# Patient Record
Sex: Male | Born: 1950 | Race: Black or African American | Hispanic: No | Marital: Married | Smoking: Former smoker
Health system: Southern US, Community
[De-identification: ages and names within clinical notes are randomized; demographics above are authoritative.]

## PROBLEM LIST (undated history)

## (undated) DIAGNOSIS — J189 Pneumonia, unspecified organism: Secondary | ICD-10-CM

## (undated) DIAGNOSIS — F1110 Opioid abuse, uncomplicated: Secondary | ICD-10-CM

## (undated) DIAGNOSIS — F1010 Alcohol abuse, uncomplicated: Secondary | ICD-10-CM

## (undated) DIAGNOSIS — I469 Cardiac arrest, cause unspecified: Secondary | ICD-10-CM

## (undated) HISTORY — PX: JOINT REPLACEMENT: SHX530

## (undated) HISTORY — PX: TOTAL HIP ARTHROPLASTY: SHX124

---
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
## 2015-10-14 ENCOUNTER — Inpatient Hospital Stay (HOSPITAL_COMMUNITY)
Admission: EM | Admit: 2015-10-14 | Discharge: 2015-10-16 | DRG: 683 | Disposition: A | Discharge status: Home / Self Care | Payer: Medicare Other | Attending: Internal Medicine | Admitting: Internal Medicine

## 2015-10-14 ENCOUNTER — Encounter (HOSPITAL_COMMUNITY): Payer: Self-pay | Admitting: Emergency Medicine

## 2015-10-14 ENCOUNTER — Emergency Department (HOSPITAL_COMMUNITY): Payer: Medicare Other

## 2015-10-14 DIAGNOSIS — F1020 Alcohol dependence, uncomplicated: Secondary | ICD-10-CM | POA: Diagnosis present

## 2015-10-14 DIAGNOSIS — J449 Chronic obstructive pulmonary disease, unspecified: Secondary | ICD-10-CM | POA: Diagnosis present

## 2015-10-14 DIAGNOSIS — R740 Nonspecific elevation of levels of transaminase and lactic acid dehydrogenase [LDH]: Secondary | ICD-10-CM

## 2015-10-14 DIAGNOSIS — J441 Chronic obstructive pulmonary disease with (acute) exacerbation: Secondary | ICD-10-CM

## 2015-10-14 DIAGNOSIS — R509 Fever, unspecified: Secondary | ICD-10-CM

## 2015-10-14 DIAGNOSIS — D7289 Other specified disorders of white blood cells: Secondary | ICD-10-CM

## 2015-10-14 DIAGNOSIS — E860 Dehydration: Secondary | ICD-10-CM | POA: Diagnosis present

## 2015-10-14 DIAGNOSIS — F1010 Alcohol abuse, uncomplicated: Secondary | ICD-10-CM

## 2015-10-14 DIAGNOSIS — E872 Acidosis, unspecified: Secondary | ICD-10-CM | POA: Diagnosis present

## 2015-10-14 DIAGNOSIS — R197 Diarrhea, unspecified: Secondary | ICD-10-CM

## 2015-10-14 DIAGNOSIS — R17 Unspecified jaundice: Secondary | ICD-10-CM

## 2015-10-14 DIAGNOSIS — E876 Hypokalemia: Secondary | ICD-10-CM | POA: Diagnosis present

## 2015-10-14 DIAGNOSIS — N179 Acute kidney failure, unspecified: Secondary | ICD-10-CM | POA: Diagnosis not present

## 2015-10-14 DIAGNOSIS — K529 Noninfective gastroenteritis and colitis, unspecified: Secondary | ICD-10-CM | POA: Diagnosis present

## 2015-10-14 DIAGNOSIS — K760 Fatty (change of) liver, not elsewhere classified: Secondary | ICD-10-CM | POA: Diagnosis present

## 2015-10-14 DIAGNOSIS — R888 Abnormal findings in other body fluids and substances: Secondary | ICD-10-CM

## 2015-10-14 DIAGNOSIS — R739 Hyperglycemia, unspecified: Secondary | ICD-10-CM | POA: Diagnosis present

## 2015-10-14 DIAGNOSIS — R112 Nausea with vomiting, unspecified: Secondary | ICD-10-CM

## 2015-10-14 DIAGNOSIS — J189 Pneumonia, unspecified organism: Secondary | ICD-10-CM

## 2015-10-14 DIAGNOSIS — Z96641 Presence of right artificial hip joint: Secondary | ICD-10-CM | POA: Diagnosis present

## 2015-10-14 DIAGNOSIS — R7401 Elevation of levels of liver transaminase levels: Secondary | ICD-10-CM

## 2015-10-14 HISTORY — DX: Pneumonia, unspecified organism: J18.9

## 2015-10-14 LAB — URINALYSIS, ROUTINE W REFLEX MICROSCOPIC
Glucose, UA: NEGATIVE mg/dL
HGB URINE DIPSTICK: NEGATIVE
Ketones, ur: 15 mg/dL — AB
Leukocytes, UA: NEGATIVE
NITRITE: NEGATIVE
Protein, ur: NEGATIVE mg/dL
SPECIFIC GRAVITY, URINE: 1.02 (ref 1.005–1.030)
pH: 6 (ref 5.0–8.0)

## 2015-10-14 LAB — HEPATIC FUNCTION PANEL
ALT: 21 U/L (ref 17–63)
AST: 64 U/L — AB (ref 15–41)
Albumin: 3.2 g/dL — ABNORMAL LOW (ref 3.5–5.0)
Alkaline Phosphatase: 58 U/L (ref 38–126)
Bilirubin, Direct: 0.4 mg/dL (ref 0.1–0.5)
Indirect Bilirubin: 1.6 mg/dL — ABNORMAL HIGH (ref 0.3–0.9)
TOTAL PROTEIN: 5.7 g/dL — AB (ref 6.5–8.1)
Total Bilirubin: 2 mg/dL — ABNORMAL HIGH (ref 0.3–1.2)

## 2015-10-14 LAB — TROPONIN I

## 2015-10-14 LAB — PHOSPHORUS: Phosphorus: 1.5 mg/dL — ABNORMAL LOW (ref 2.5–4.6)

## 2015-10-14 LAB — CBC WITH DIFFERENTIAL/PLATELET
BASOS ABS: 0 10*3/uL (ref 0.0–0.1)
BASOS PCT: 0 %
EOS ABS: 0 10*3/uL (ref 0.0–0.7)
Eosinophils Relative: 0 %
HCT: 40.9 % (ref 39.0–52.0)
Hemoglobin: 14.4 g/dL (ref 13.0–17.0)
LYMPHS ABS: 1.2 10*3/uL (ref 0.7–4.0)
Lymphocytes Relative: 13 %
MCH: 30.1 pg (ref 26.0–34.0)
MCHC: 35.2 g/dL (ref 30.0–36.0)
MCV: 85.6 fL (ref 78.0–100.0)
MONO ABS: 1 10*3/uL (ref 0.1–1.0)
Monocytes Relative: 11 %
NEUTROS ABS: 6.9 10*3/uL (ref 1.7–7.7)
Neutrophils Relative %: 76 %
Platelets: 172 10*3/uL (ref 150–400)
RBC: 4.78 MIL/uL (ref 4.22–5.81)
RDW: 12.6 % (ref 11.5–15.5)
WBC: 9.1 10*3/uL (ref 4.0–10.5)

## 2015-10-14 LAB — COMPREHENSIVE METABOLIC PANEL
ALBUMIN: 4.2 g/dL (ref 3.5–5.0)
ALK PHOS: 71 U/L (ref 38–126)
ALT: 24 U/L (ref 17–63)
ANION GAP: 16 — AB (ref 5–15)
AST: 72 U/L — AB (ref 15–41)
BILIRUBIN TOTAL: 2.4 mg/dL — AB (ref 0.3–1.2)
BUN: 34 mg/dL — AB (ref 6–20)
CALCIUM: 9 mg/dL (ref 8.9–10.3)
CO2: 21 mmol/L — AB (ref 22–32)
CREATININE: 1.54 mg/dL — AB (ref 0.61–1.24)
Chloride: 105 mmol/L (ref 101–111)
GFR calc Af Amer: 54 mL/min — ABNORMAL LOW (ref >60)
GFR calc non Af Amer: 46 mL/min — ABNORMAL LOW (ref >60)
GLUCOSE: 137 mg/dL — AB (ref 65–99)
Potassium: 3.5 mmol/L (ref 3.5–5.1)
Sodium: 142 mmol/L (ref 135–145)
TOTAL PROTEIN: 7.5 g/dL (ref 6.5–8.1)

## 2015-10-14 LAB — LIPASE, BLOOD: LIPASE: 26 U/L (ref 11–51)

## 2015-10-14 LAB — I-STAT TROPONIN, ED: TROPONIN I, POC: 0 ng/mL (ref 0.00–0.08)

## 2015-10-14 LAB — INFLUENZA PANEL BY PCR (TYPE A & B)
H1N1FLUPCR: NOT DETECTED
INFLBPCR: NEGATIVE
Influenza A By PCR: NEGATIVE

## 2015-10-14 LAB — ETHANOL: Alcohol, Ethyl (B): <5 mg/dL (ref <5)

## 2015-10-14 LAB — I-STAT CG4 LACTIC ACID, ED
Lactic Acid, Venous: 4.61 mmol/L (ref 0.5–1.9)
Lactic Acid, Venous: 1.33 mmol/L (ref 0.5–1.9)

## 2015-10-14 LAB — STREP PNEUMONIAE URINARY ANTIGEN: STREP PNEUMO URINARY ANTIGEN: NEGATIVE

## 2015-10-14 LAB — MAGNESIUM: Magnesium: 1.9 mg/dL (ref 1.7–2.4)

## 2015-10-14 MED ORDER — TRAMADOL HCL 50 MG PO TABS
50.0000 mg | ORAL_TABLET | Freq: Four times a day (QID) | ORAL | Status: DC | PRN
  Administered 2015-10-14: 50 mg via ORAL
  Filled 2015-10-14: qty 1

## 2015-10-14 MED ORDER — LORAZEPAM 2 MG/ML IJ SOLN
1.0000 mg | Freq: Four times a day (QID) | INTRAMUSCULAR | Status: DC | PRN
  Administered 2015-10-14: 1 mg via INTRAVENOUS
  Filled 2015-10-14: qty 1

## 2015-10-14 MED ORDER — DEXTROSE 5 % IV SOLN
1.0000 g | Freq: Once | INTRAVENOUS | Status: AC
  Administered 2015-10-14: 1 g via INTRAVENOUS
  Filled 2015-10-14: qty 10

## 2015-10-14 MED ORDER — ACETAMINOPHEN 325 MG PO TABS: 650.0000 mg | ORAL_TABLET | Freq: Four times a day (QID) | ORAL | Status: DC | PRN

## 2015-10-14 MED ORDER — DEXTROSE 5 % IV SOLN: 500.0000 mg | INTRAVENOUS | Status: DC

## 2015-10-14 MED ORDER — SODIUM CHLORIDE 0.9% FLUSH
3.0000 mL | Freq: Two times a day (BID) | INTRAVENOUS | Status: DC
  Administered 2015-10-14 – 2015-10-15 (×3): 3 mL via INTRAVENOUS

## 2015-10-14 MED ORDER — DEXTROSE 5 % IV SOLN
500.0000 mg | Freq: Once | INTRAVENOUS | Status: AC
  Administered 2015-10-14: 500 mg via INTRAVENOUS
  Filled 2015-10-14: qty 500

## 2015-10-14 MED ORDER — FOLIC ACID 1 MG PO TABS
1.0000 mg | ORAL_TABLET | Freq: Every day | ORAL | Status: DC
  Administered 2015-10-15 – 2015-10-16 (×2): 1 mg via ORAL
  Filled 2015-10-14 (×2): qty 1

## 2015-10-14 MED ORDER — ENOXAPARIN SODIUM 30 MG/0.3ML ~~LOC~~ SOLN: 30.0000 mg | SUBCUTANEOUS | Status: DC

## 2015-10-14 MED ORDER — ONDANSETRON HCL 4 MG/2ML IJ SOLN: 4.0000 mg | Freq: Four times a day (QID) | INTRAMUSCULAR | Status: DC | PRN

## 2015-10-14 MED ORDER — VITAMIN B-1 100 MG PO TABS
100.0000 mg | ORAL_TABLET | Freq: Every day | ORAL | Status: DC
  Administered 2015-10-15 – 2015-10-16 (×2): 100 mg via ORAL
  Filled 2015-10-14 (×2): qty 1

## 2015-10-14 MED ORDER — SODIUM CHLORIDE 0.9 % IV BOLUS (SEPSIS)
500.0000 mL | Freq: Once | INTRAVENOUS | Status: AC
  Administered 2015-10-14: 500 mL via INTRAVENOUS

## 2015-10-14 MED ORDER — SODIUM CHLORIDE 0.9 % IV SOLN
INTRAVENOUS | Status: DC
  Administered 2015-10-15 (×2): via INTRAVENOUS

## 2015-10-14 MED ORDER — SODIUM CHLORIDE 0.9 % IV BOLUS (SEPSIS)
1000.0000 mL | Freq: Once | INTRAVENOUS | Status: AC
  Administered 2015-10-14: 1000 mL via INTRAVENOUS

## 2015-10-14 MED ORDER — DEXTROSE 5 % IV SOLN
1.0000 g | INTRAVENOUS | Status: DC
  Filled 2015-10-14: qty 10

## 2015-10-14 MED ORDER — SODIUM CHLORIDE 0.9 % IV SOLN
1000.0000 mL | INTRAVENOUS | Status: DC
  Administered 2015-10-14: 1000 mL via INTRAVENOUS

## 2015-10-14 MED ORDER — ACETAMINOPHEN 650 MG RE SUPP: 650.0000 mg | Freq: Four times a day (QID) | RECTAL | Status: DC | PRN

## 2015-10-14 MED ORDER — ONDANSETRON HCL 4 MG/2ML IJ SOLN
4.0000 mg | Freq: Once | INTRAMUSCULAR | Status: AC
  Administered 2015-10-14: 4 mg via INTRAVENOUS
  Filled 2015-10-14: qty 2

## 2015-10-14 MED ORDER — LORAZEPAM 1 MG PO TABS
1.0000 mg | ORAL_TABLET | Freq: Four times a day (QID) | ORAL | Status: DC | PRN
  Administered 2015-10-15: 1 mg via ORAL
  Filled 2015-10-14: qty 1

## 2015-10-14 MED ORDER — SODIUM CHLORIDE 0.9 % IV BOLUS (SEPSIS)
250.0000 mL | Freq: Once | INTRAVENOUS | Status: AC
  Administered 2015-10-14: 250 mL via INTRAVENOUS

## 2015-10-14 MED ORDER — THIAMINE HCL 100 MG/ML IJ SOLN: 100.0000 mg | Freq: Every day | INTRAMUSCULAR | Status: DC

## 2015-10-14 MED ORDER — ADULT MULTIVITAMIN W/MINERALS CH
1.0000 | ORAL_TABLET | Freq: Every day | ORAL | Status: DC
  Administered 2015-10-15 – 2015-10-16 (×2): 1 via ORAL
  Filled 2015-10-14 (×2): qty 1

## 2015-10-14 MED ORDER — LORAZEPAM 2 MG/ML IJ SOLN
1.0000 mg | Freq: Once | INTRAMUSCULAR | Status: AC
  Administered 2015-10-14: 1 mg via INTRAVENOUS
  Filled 2015-10-14: qty 1

## 2015-10-14 MED ORDER — ONDANSETRON HCL 4 MG PO TABS: 4.0000 mg | ORAL_TABLET | Freq: Four times a day (QID) | ORAL | Status: DC | PRN

## 2015-10-14 MED ORDER — ENOXAPARIN SODIUM 30 MG/0.3ML ~~LOC~~ SOLN
30.0000 mg | SUBCUTANEOUS | Status: DC
  Administered 2015-10-14 – 2015-10-15 (×2): 30 mg via SUBCUTANEOUS
  Filled 2015-10-14 (×2): qty 0.3

## 2015-10-14 NOTE — ED Provider Notes (Signed)
[facility]-EMERGENCY DEPT Provider Note   CSN: [id number] Arrival date & time: 10/14/15  1215     History   Chief Complaint Chief Complaint  Patient presents with  . Nausea  . Emesis  . Abnormal ECG    HPI Micheal Carey is a 65 y.o. male with a PMHx of COPD, who presents to the ED with complaints of 1-2 days of nausea, vomiting, and diarrhea. Patient states he has had 15 episodes of nonbloody nonbilious emesis in the last 24 hours, and 5 episodes of nonbloody watery diarrhea in the last 24 hours. He has taken Alka-Seltzer with no relief. His wife states that he was so weak yesterday that he could not pick her up from the airport, and today his symptoms were worsening so he called the ambulance. They arrived and found him to be febrile to 101.5, was given 1 g Tylenol prior to arrival, and his fever is 98.4 upon arrival. He admits to "binge drinking" 2 days ago, and the following day the symptoms started, and his wife states that every time he drinks heavily, he has n/v/d in the subsequent days. He states he drinks "occasionally". He denies any other complaints, including chest pain, shortness breath, abdominal pain, hematemesis, melena, hematochezia, constipation, obstipation, cough, rhinorrhea, sore throat, URI symptoms, dysuria, hematuria, numbness, tingling, or focal weakness. He denies any recent traveling, suspicious food intake, sick contacts, recent antibiotics, or chronic NSAID use. Denies any prior abdominal surgeries. His PCP is Dr. Katrinka Blazing at the [facility] in [place]. He has COPD but he denies any other medical problems, including DM2, HTN, or abdominal conditions.    The history is provided by the patient and the spouse. No language interpreter was used.  Emesis   This is a new problem. The current episode started yesterday. The problem occurs more than 10 times per day. The problem has not changed since onset.The emesis has an appearance of stomach contents. The maximum temperature  recorded prior to his arrival was 101 to 101.9 F. The fever has been present for less than 1 day. Associated symptoms include chills, diarrhea and a fever (101.5). Pertinent negatives include no abdominal pain, no arthralgias, no cough, no myalgias and no URI.    History reviewed. No pertinent past medical history.  There are no active problems to display for this patient.   Past Surgical History:  Procedure Laterality Date  . TOTAL HIP ARTHROPLASTY Right        Home Medications    Prior to Admission medications   Not on File    Family History History reviewed. No pertinent family history.  Social History Social History  Substance Use Topics  . Smoking status: Never Smoker  . Smokeless tobacco: Never Used  . Alcohol use Yes     Comment: occasionally - patient reports however he drink 1 bottle of wine on tuesday 10/14/15     Allergies   Review of patient's allergies indicates not on file.   Review of Systems Review of Systems  Constitutional: Positive for chills and fever (101.5).  HENT: Negative for rhinorrhea and sore throat.   Respiratory: Negative for cough and shortness of breath.   Cardiovascular: Negative for chest pain.  Gastrointestinal: Positive for diarrhea, nausea and vomiting. Negative for abdominal pain, blood in stool and constipation.  Genitourinary: Negative for dysuria and hematuria.  Musculoskeletal: Negative for arthralgias and myalgias.  Skin: Negative for color change.  Allergic/Immunologic: Negative for immunocompromised state.  Neurological: Negative for weakness and numbness.  Psychiatric/Behavioral:  Negative for confusion.   10 Systems reviewed and are negative for acute change except as noted in the HPI.   Physical Exam Updated Vital Signs BP 154/80 (BP Location: Right Arm)   Pulse 96   Temp 98.4 F (36.9 C) (Oral)   Resp 18   Ht 5\' 8"  (1.727 m)   Wt 51.3 kg   SpO2 100%   BMI 17.18 kg/m   Physical Exam  Constitutional: He  is oriented to person, place, and time. Vital signs are normal. He appears well-developed and well-nourished.  Non-toxic appearance. No distress.  Afebrile, nontoxic, NAD  HENT:  Head: Normocephalic and atraumatic.  Mouth/Throat: Oropharynx is clear and moist and mucous membranes are normal.  Eyes: Conjunctivae and EOM are normal. Right eye exhibits no discharge. Left eye exhibits no discharge.  Neck: Normal range of motion. Neck supple.  Cardiovascular: Normal rate, regular rhythm, normal heart sounds and intact distal pulses.  Exam reveals no gallop and no friction rub.   No murmur heard. Pulmonary/Chest: Effort normal and breath sounds normal. No respiratory distress. He has no decreased breath sounds. He has no wheezes. He has no rhonchi. He has no rales.  CTAB in all lung fields, no w/r/r, no hypoxia or increased WOB, speaking in full sentences, SpO2 100% on RA   Abdominal: Soft. Normal appearance and bowel sounds are normal. He exhibits no distension. There is no tenderness. There is no rigidity, no rebound, no guarding, no CVA tenderness, no tenderness at McBurney's point and negative Murphy's sign.  Soft, NTND, +BS throughout, no r/g/r, neg murphy's, neg mcburney's, no CVA TTP   Musculoskeletal: Normal range of motion.  Neurological: He is alert and oriented to person, place, and time. He has normal strength. No sensory deficit.  Skin: Skin is warm, dry and intact. No rash noted.  Psychiatric: He has a normal mood and affect.  Nursing note and vitals reviewed.    ED Treatments / Results  Labs (all labs ordered are listed, but only abnormal results are displayed) Labs Reviewed  COMPREHENSIVE METABOLIC PANEL - Abnormal; Notable for the following:       Result Value   CO2 21 (*)    Glucose, Bld 137 (*)    BUN 34 (*)    Creatinine, Ser 1.54 (*)    AST 72 (*)    Total Bilirubin 2.4 (*)    GFR calc non Af Amer 46 (*)    GFR calc Af Amer 54 (*)    Anion gap 16 (*)    All other  components within normal limits  I-STAT CG4 LACTIC ACID, ED - Abnormal; Notable for the following:    Lactic Acid, Venous 4.61 (*)    All other components within normal limits  CULTURE, BLOOD (ROUTINE X 2)  CULTURE, BLOOD (ROUTINE X 2)  URINE CULTURE  CBC WITH DIFFERENTIAL/PLATELET  LIPASE, BLOOD  URINALYSIS, ROUTINE W REFLEX MICROSCOPIC (NOT AT [facility])  ETHANOL  I-STAT TROPOININ, ED  I-STAT CG4 LACTIC ACID, ED    EKG  EKG Interpretation  Date/Time:  Thursday October 14 2015 12:25:13 EDT Ventricular Rate:  94 PR Interval:    QRS Duration: 105 QT Interval:  450 QTC Calculation: 563 R Axis:   95 Text Interpretation:  Sinus rhythm Borderline short PR interval Probable left atrial enlargement Probable right ventricular hypertrophy Abnrm T, consider ischemia, anterolateral lds Prolonged QT interval Baseline wander in lead(s) II III aVR aVL aVF V1 V4 No old tracing to compare Confirmed by FLOYD MD,  DANIEL [phone]([id number]) on 10/14/2015 12:54:58 PM       Radiology No results found.  Procedures Procedures (including critical care time)  Medications Ordered in ED Medications  0.9 %  sodium chloride infusion (1,000 mLs Intravenous New Bag/Given 10/14/15 1309)  cefTRIAXone (ROCEPHIN) 1 g in dextrose 5 % 50 mL IVPB (not administered)  azithromycin (ZITHROMAX) 500 mg in dextrose 5 % 250 mL IVPB (not administered)  sodium chloride 0.9 % bolus 1,000 mL (1,000 mLs Intravenous New Bag/Given 10/14/15 1308)    And  sodium chloride 0.9 % bolus 500 mL (0 mLs Intravenous Stopped 10/14/15 1337)    And  sodium chloride 0.9 % bolus 250 mL (0 mLs Intravenous Stopped 10/14/15 1329)  ondansetron (ZOFRAN) injection 4 mg (4 mg Intravenous Given 10/14/15 1309)  LORazepam (ATIVAN) injection 1 mg (1 mg Intravenous Given 10/14/15 1333)     Initial Impression / Assessment and Plan / ED Course  I have reviewed the triage vital signs and the nursing notes.  Pertinent labs & imaging results that were available  during my care of the patient were reviewed by me and considered in my medical decision making (see chart for details).  Clinical Course    65 y.o. male here with n/v/d x1-2 days, and fever 101.5 PTA, given tylenol PTA and afebrile on arrival. No abdominal pain, no URI symptoms, no cough, clear lung exam, no abdominal tenderness on exam. Admits to drinking alcohol occasionally, last drank 2 days ago, wife states that he gets similar symptoms when he stops drinking after drinking for a few days. ?Withdrawal, although fever is an odd symptom to have with withdrawal. ?Gastroenteritis vs pancreatitis, although no abd pain is also strange for these conditions. Will get labs, U/A, UCx, BCx, lactic, and trop. EKG with RVH, no RBBB seen, no acute ischemic findings on review although baseline wander in multiple leads makes it difficult to assess, and no prior to compare. Doubt need for imaging. Doubt need to call code sepsis or start empiric abx, given that he's afebrile and nontoxic, and without tachycardia or hypotension, but will see what labs show and re-evaluate. Weight based fluids ordered. Will give zofran as well. Will reassess shortly  1:30 PM Nursing stating that he's somewhat tremulous and wonders if he may actually be withdrawing from alcohol, question if he's truthful with how much or how recently he drank, requesting ativan, I feel this is reasonable and may help with his nausea as well. Awaiting work up results, will reassess shortly.   3:11 PM CBC w/diff unremarkable, although noted to have "atypical lymphocytes", wonder if viral infection could be the etiology for this. Lactic acid level 4.61, fluids running. CMP with BUN 34 Cr 1.54 (unknown baseline), AST 72, ALT WNL, Bili 2.4 (?from dehydration vs alcohol use), anion gap 16 likely from lactic acidosis. Lipase WNL. Trop WNL. Added on EtOH level, since pt admits to drinking, want to ensure that he isn't being untruthful about WHEN he last drank,  and that the anion gap isn't from EtOH intoxication. U/A not yet obtained, will ensure this is done soon. Discussed case with my attending Dr. Adela LankFloyd who will see pt, recommended adding on CXR as well to ensure no PNA as source of fever, despite not having any symptoms of such. Very strange picture thus far, with the lactic acidosis but no leukocytosis-- given lactic acid level elevation, will go ahead and start empiric abx. Awaiting U/A, EtOH, and CXR, then will reassess. Pt states his symptoms have improved,  no current questions/complaints at this time. Will likely need admission.   3:51 PM EtOH undetectable. CXR unremarkable aside from ?nipple shadow vs nodule-- no PNA seen. Awaiting U/A but will proceed with admission for ongoing management of his lactic acidosis/atypical lymphocytes/AKI.   4:24 PM  Spoke with Dr. Susie Cassette of [facility] who will admit this pt. Please see their notes for further documentation of care. Pt stable at this time. I appreciate admitting teams' help with this patient's care.    Final Clinical Impressions(s) / ED Diagnoses   Final diagnoses:  Nausea vomiting and diarrhea  Dehydration  Lactic acid acidosis  AKI (acute kidney injury) (HCC)  Elevated bilirubin  Atypical lymphocytes present on peripheral blood smear  Fever, unspecified fever cause    New Prescriptions New Prescriptions   No medications on file     Shaena Parkerson Camprubi-Soms, PA-C 10/14/15 1624    Melene Plan, DO 10/14/15 1721

## 2015-10-14 NOTE — ED Notes (Signed)
Pt unable to void at this time. 

## 2015-10-14 NOTE — H&P (Signed)
[facility] History and Physical  Jago Carton ZOX:[id number] DOB: 1951/01/08 DOA: 10/14/2015  Referring physician:   PCP: No primary care provider on file.   Chief Complaint:   Nausea  . Emesis  . Abnormal ECG      HPI:  65 y.o. male with a PMHx of COPD, who presents to the ED with complaints of 1-2 days of nausea, vomiting, and diarrhea. Patient states he has had 15 episodes of nonbloody nonbilious emesis in the last 24 hours, and 5 episodes of nonbloody watery diarrhea in the last 24 hours. He has taken Alka-Seltzer with no relief. His wife states that he was so weak yesterday that he has not been able to get out of bed. Patient states that he's had diarrhea up until prior to arrival to the ER. Upon arrival patient found to have 101.5 fever. Patient admits to binge drinking every now and then, drank heavily over the weekend. Patient was also in [facility] about 1-1/2 weeks ago. Despite multiple bowel movements and episodes of vomiting, he denies hematemesis, melena, hematochezia. He is a nonsmoker. Denies any recent weight loss. In the ER patient's lactic acid was 4.61 which improved to 1.33 with IV fluids. Initial creatinine was 1.54. UA negative. EtOH level less than 5. Patient initiated on Rocephin and azithromycin for possible pneumonia     Review of Systems: negative for the following  Constitutional: Positive for fever, chills, diaphoresis, denies appetite change and fatigue.  HEENT: Denies photophobia, eye pain, redness, hearing loss, ear pain, congestion, sore throat, rhinorrhea, sneezing, mouth sores, trouble swallowing, neck pain, neck stiffness and tinnitus.  Respiratory: Denies SOB, DOE, cough, chest tightness, and wheezing.  Cardiovascular: Denies chest pain, palpitations and leg swelling.  Gastrointestinal: Positive for nausea, vomiting, abdominal pain, diarrhea, denies constipation, blood in stool and abdominal distention.  Genitourinary: Denies dysuria, urgency,  frequency, hematuria, flank pain and difficulty urinating.  Musculoskeletal: Denies myalgias, back pain, joint swelling, arthralgias and gait problem.  Skin: Denies pallor, rash and wound.  Neurological: Denies dizziness, seizures, syncope, weakness, light-headedness, numbness and headaches.  Hematological: Denies adenopathy. Easy bruising, personal or family bleeding history  Psychiatric/Behavioral: Denies suicidal ideation, mood changes, confusion, nervousness, sleep disturbance and agitation       History reviewed. No pertinent past medical history.   Past Surgical History:  Procedure Laterality Date  . TOTAL HIP ARTHROPLASTY Right       Social History:  reports that he has never smoked. He has never used smokeless tobacco. He reports that he drinks alcohol. His drug history is not on file.    Allergies-none      FAMILY HISTORY  When questioned  Directly-patient reports  No family history of HTN, CVA ,DIABETES, TB, Cancer CAD, Bleeding Disorders, Sickle Cell, diabetes, anemia, asthma,   Prior to Admission medications   Not on File     Physical Exam: Vitals:   10/14/15 1559 10/14/15 1600 10/14/15 1615 10/14/15 1727  BP: 147/91 157/82 154/80 136/77  Pulse: 68 69 77 79  Resp: 12 15 17 19   Temp:      TempSrc:      SpO2: 100% 98% 95% 99%  Weight:      Height:          Constitutional: NAD, calm, comfortable Vitals:   10/14/15 1559 10/14/15 1600 10/14/15 1615 10/14/15 1727  BP: 147/91 157/82 154/80 136/77  Pulse: 68 69 77 79  Resp: 12 15 17 19   Temp:      TempSrc:  SpO2: 100% 98% 95% 99%  Weight:      Height:       Eyes: PERRL, lids and conjunctivae normal, chronically ill-appearing ENMT: Mucous membranes are moist. Posterior pharynx clear of any exudate or lesions.Normal dentition.  Neck: normal, supple, no masses, no thyromegaly Respiratory: clear to auscultation bilaterally, no wheezing, no crackles. Normal respiratory effort. No accessory muscle  use.  Cardiovascular: Regular rate and rhythm, no murmurs / rubs / gallops. No extremity edema. 2+ pedal pulses. No carotid bruits.  Abdomen: no tenderness, no masses palpated. No hepatosplenomegaly. Bowel sounds positive.  Musculoskeletal: no clubbing / cyanosis. No joint deformity upper and lower extremities. Good ROM, no contractures. Normal muscle tone.  Skin: no rashes, lesions, ulcers. No induration Neurologic: CN 2-12 grossly intact. Sensation intact, DTR normal. Strength 5/5 in all 4.  Psychiatric: Normal judgment and insight. Alert and oriented x 3. Normal mood.     Labs on Admission: I have personally reviewed following labs and imaging studies  CBC:  Recent Labs Lab 10/14/15 1315  WBC 9.1  NEUTROABS 6.9  HGB 14.4  HCT 40.9  MCV 85.6  PLT 172    Basic Metabolic Panel:  Recent Labs Lab 10/14/15 1315  NA 142  K 3.5  CL 105  CO2 21*  GLUCOSE 137*  BUN 34*  CREATININE 1.54*  CALCIUM 9.0    GFR: Estimated Creatinine Clearance: 35.6 mL/min (by C-G formula based on SCr of 1.54 mg/dL (H)).  Liver Function Tests:  Recent Labs Lab 10/14/15 1315  AST 72*  ALT 24  ALKPHOS 71  BILITOT 2.4*  PROT 7.5  ALBUMIN 4.2    Recent Labs Lab 10/14/15 1315  LIPASE 26   No results for input(s): AMMONIA in the last 168 hours.  Coagulation Profile: No results for input(s): INR, PROTIME in the last 168 hours. No results for input(s): DDIMER in the last 72 hours.  Cardiac Enzymes: No results for input(s): CKTOTAL, CKMB, CKMBINDEX, TROPONINI in the last 168 hours.  BNP (last 3 results) No results for input(s): PROBNP in the last 8760 hours.  HbA1C: No results for input(s): HGBA1C in the last 72 hours. No results found for: HGBA1C   CBG: No results for input(s): GLUCAP in the last 168 hours.  Lipid Profile: No results for input(s): CHOL, HDL, LDLCALC, TRIG, CHOLHDL, LDLDIRECT in the last 72 hours.  Thyroid Function Tests: No results for input(s): TSH,  T4TOTAL, FREET4, T3FREE, THYROIDAB in the last 72 hours.  Anemia Panel: No results for input(s): VITAMINB12, FOLATE, FERRITIN, TIBC, IRON, RETICCTPCT in the last 72 hours.  Urine analysis:    Component Value Date/Time   COLORURINE YELLOW 10/14/2015 1554   APPEARANCEUR CLEAR 10/14/2015 1554   LABSPEC 1.020 10/14/2015 1554   PHURINE 6.0 10/14/2015 1554   GLUCOSEU NEGATIVE 10/14/2015 1554   HGBUR NEGATIVE 10/14/2015 1554   BILIRUBINUR SMALL (A) 10/14/2015 1554   KETONESUR 15 (A) 10/14/2015 1554   PROTEINUR NEGATIVE 10/14/2015 1554   NITRITE NEGATIVE 10/14/2015 1554   LEUKOCYTESUR NEGATIVE 10/14/2015 1554    Sepsis Labs: @LABRCNTIP (procalcitonin:4,lacticidven:4) )No results found for this or any previous visit (from the past 240 hour(s)).       Radiological Exams on Admission: Dg Chest 2 View  Result Date: 10/14/2015 CLINICAL DATA:  Fever. EXAM: CHEST  2 VIEW COMPARISON:  None. FINDINGS: The heart size and mediastinal contours are within normal limits. No pneumothorax or pleural effusion is noted. Right lung is clear. Rounded nodular density seen projected over left lower lobe  most consistent with overlying nipple shadow. Otherwise left lung is clear. The visualized skeletal structures are unremarkable. IMPRESSION: Rounded nodular density seen projected over left lower lobe most consistent with overlying nipple shadow. Repeat radiograph with nipple markers is recommended to rule out pulmonary nodule. Otherwise no abnormality seen in the chest. Electronically Signed   By: Lupita Raider, M.D.   On: 10/14/2015 15:47   Dg Chest 2 View  Result Date: 10/14/2015 CLINICAL DATA:  Fever. EXAM: CHEST  2 VIEW COMPARISON:  None. FINDINGS: The heart size and mediastinal contours are within normal limits. No pneumothorax or pleural effusion is noted. Right lung is clear. Rounded nodular density seen projected over left lower lobe most consistent with overlying nipple shadow. Otherwise left lung is  clear. The visualized skeletal structures are unremarkable. IMPRESSION: Rounded nodular density seen projected over left lower lobe most consistent with overlying nipple shadow. Repeat radiograph with nipple markers is recommended to rule out pulmonary nodule. Otherwise no abnormality seen in the chest. Electronically Signed   By: Lupita Raider, M.D.   On: 10/14/2015 15:47      EKG: Independently reviewed.  Date/Time:                  Thursday October 14 2015 12:25:13 EDT Ventricular Rate:   94 PR Interval:                        QRS Duration:        105 QT Interval:                      450 QTC Calculation:    563 R Axis:                         95 Text Interpretation:  Sinus rhythm Borderline short PR interval Probable left atrial enlargement Probable right ventricular hypertrophy Abnrm T, consider ischemia, anterolateral lds Prolonged QT interval Baseline wander in lead(s) II III aVR aVL aVF V1 V4 No old tracing to compare   Assessment/Plan Principal Problem:   CAP (community acquired pneumonia)-initial chest x-ray showed a rounded opacity in the left lower lobe, given fever patient was started on empiric antibiotics, received Rocephin/azithromycin which will be continued We'll repeat chest x-ray tomorrow-if no pneumonia seen-consider discontinuing antibiotics Focused pneumonia set orders in place Blood culture 2 obtained in the ER      Lactic acidosis-in the setting of nausea vomiting diarrhea, lactate improved after IV fluids  Intractable nausea vomiting diarrhea-suspect viral gastroenteritis, continue supportive care with antiemetics, fluids     AKI (acute kidney injury) (HCC)-prerenal, initial creatinine 1.54, baseline unknown, continue generous IV hydration    Alcohol abuse-initiated on CIWA protocol, folic acid, thiamine  Abnormal EKG-concerning for left atrial enlargement, initial point of care troponin negative, continue to cycle cardiac enzymes, 2-D echo in the morning  to rule out wall motion abnormalities, especially in the setting of alcohol abuse     DVT prophylaxis: Lovenox     Code Status Orders        Start     Ordered   10/14/15 1639  Full code  Continuous     10/14/15 1642    Code Status History    Date Active Date Inactive Code Status Order ID Comments User Context   This patient has a current code status but no historical code status.      Family Communication: discussed  with patient's Wife By the bedside   Disposition Plan:  Anticipate discharge in 2-3 days pending further workup and clinical progress  Consults called: None  Admission status observation  Total time spent 55 minutes.Greater than 50% of this time was spent in counseling, explanation of diagnosis, planning of further management, and coordination of care  [facility] MD [facility] Pager [phone]  If 7PM-7AM, please contact night-coverage [email or web address] Password [facility]  10/14/2015, 6:06 PM

## 2015-10-14 NOTE — ED Triage Notes (Signed)
Pt to ER BIB [facility] from home with wife, patient in for evaluation of nausea and vomiting onset yesterday with fever, fever measuring 101.5 per EMS, received 1 g tylenol. Pt also reports diarrhea. Denies pain. Per EMS patient EKG showing RBBB, no hx of same. Pt does not see a cardiologist. Not able to tolerate PO fluids since early yesterday morning. Received 300 cc NS in route. HR 110-120, BP 161/92, CBG 141. A/o x4. Speaking in full sentences. Wife at bedside.

## 2015-10-14 NOTE — ED Notes (Signed)
Pt reports "binge drinking" on Tuesday but denies daily ETOH consumption. Tremors noted.

## 2015-10-14 NOTE — ED Notes (Signed)
Attempted report to [facility]W. 

## 2015-10-15 ENCOUNTER — Observation Stay (HOSPITAL_COMMUNITY): Payer: Medicare Other

## 2015-10-15 ENCOUNTER — Observation Stay (HOSPITAL_BASED_OUTPATIENT_CLINIC_OR_DEPARTMENT_OTHER): Payer: Medicare Other

## 2015-10-15 DIAGNOSIS — J449 Chronic obstructive pulmonary disease, unspecified: Secondary | ICD-10-CM | POA: Diagnosis present

## 2015-10-15 DIAGNOSIS — R197 Diarrhea, unspecified: Secondary | ICD-10-CM

## 2015-10-15 DIAGNOSIS — K529 Noninfective gastroenteritis and colitis, unspecified: Secondary | ICD-10-CM | POA: Diagnosis present

## 2015-10-15 DIAGNOSIS — R1110 Vomiting, unspecified: Secondary | ICD-10-CM

## 2015-10-15 DIAGNOSIS — R112 Nausea with vomiting, unspecified: Secondary | ICD-10-CM | POA: Diagnosis present

## 2015-10-15 DIAGNOSIS — F1020 Alcohol dependence, uncomplicated: Secondary | ICD-10-CM | POA: Diagnosis present

## 2015-10-15 DIAGNOSIS — E860 Dehydration: Secondary | ICD-10-CM

## 2015-10-15 DIAGNOSIS — E872 Acidosis: Secondary | ICD-10-CM | POA: Diagnosis present

## 2015-10-15 DIAGNOSIS — R9431 Abnormal electrocardiogram [ECG] [EKG]: Secondary | ICD-10-CM

## 2015-10-15 DIAGNOSIS — Z96641 Presence of right artificial hip joint: Secondary | ICD-10-CM | POA: Diagnosis present

## 2015-10-15 DIAGNOSIS — E876 Hypokalemia: Secondary | ICD-10-CM | POA: Diagnosis present

## 2015-10-15 DIAGNOSIS — N179 Acute kidney failure, unspecified: Secondary | ICD-10-CM | POA: Diagnosis present

## 2015-10-15 DIAGNOSIS — R739 Hyperglycemia, unspecified: Secondary | ICD-10-CM | POA: Diagnosis present

## 2015-10-15 DIAGNOSIS — R888 Abnormal findings in other body fluids and substances: Secondary | ICD-10-CM

## 2015-10-15 DIAGNOSIS — R7401 Elevation of levels of liver transaminase levels: Secondary | ICD-10-CM

## 2015-10-15 DIAGNOSIS — F1010 Alcohol abuse, uncomplicated: Secondary | ICD-10-CM

## 2015-10-15 DIAGNOSIS — R740 Nonspecific elevation of levels of transaminase and lactic acid dehydrogenase [LDH]: Secondary | ICD-10-CM

## 2015-10-15 DIAGNOSIS — K760 Fatty (change of) liver, not elsewhere classified: Secondary | ICD-10-CM | POA: Diagnosis present

## 2015-10-15 LAB — GASTROINTESTINAL PANEL BY PCR, STOOL (REPLACES STOOL CULTURE)

## 2015-10-15 LAB — COMPREHENSIVE METABOLIC PANEL
ALK PHOS: 58 U/L (ref 38–126)
ALT: 21 U/L (ref 17–63)
ANION GAP: 8 (ref 5–15)
AST: 64 U/L — ABNORMAL HIGH (ref 15–41)
Albumin: 3.2 g/dL — ABNORMAL LOW (ref 3.5–5.0)
BUN: 16 mg/dL (ref 6–20)
CALCIUM: 7.6 mg/dL — AB (ref 8.9–10.3)
CO2: 23 mmol/L (ref 22–32)
Chloride: 108 mmol/L (ref 101–111)
Creatinine, Ser: 0.86 mg/dL (ref 0.61–1.24)
GFR calc non Af Amer: >60 mL/min (ref >60)
Glucose, Bld: 88 mg/dL (ref 65–99)
POTASSIUM: 3 mmol/L — AB (ref 3.5–5.1)
SODIUM: 139 mmol/L (ref 135–145)
TOTAL PROTEIN: 5.9 g/dL — AB (ref 6.5–8.1)
Total Bilirubin: 2 mg/dL — ABNORMAL HIGH (ref 0.3–1.2)

## 2015-10-15 LAB — CBC
HEMATOCRIT: 38.2 % — AB (ref 39.0–52.0)
HEMOGLOBIN: 12.5 g/dL — AB (ref 13.0–17.0)
MCH: 28.9 pg (ref 26.0–34.0)
MCHC: 32.7 g/dL (ref 30.0–36.0)
MCV: 88.4 fL (ref 78.0–100.0)
Platelets: 131 10*3/uL — ABNORMAL LOW (ref 150–400)
RBC: 4.32 MIL/uL (ref 4.22–5.81)
RDW: 12.8 % (ref 11.5–15.5)
WBC: 5.7 10*3/uL (ref 4.0–10.5)

## 2015-10-15 LAB — URINE CULTURE: CULTURE: NO GROWTH

## 2015-10-15 LAB — ECHOCARDIOGRAM COMPLETE
HEIGHTINCHES: 68 in
Weight: 1808 oz

## 2015-10-15 LAB — TROPONIN I: Troponin I: <0.03 ng/mL (ref <0.03)

## 2015-10-15 LAB — HEMOGLOBIN A1C
HEMOGLOBIN A1C: 4.9 % (ref 4.8–5.6)
Mean Plasma Glucose: 94 mg/dL

## 2015-10-15 LAB — PROCALCITONIN: Procalcitonin: <0.1 ng/mL

## 2015-10-15 LAB — HIV ANTIBODY (ROUTINE TESTING W REFLEX): HIV Screen 4th Generation wRfx: NONREACTIVE

## 2015-10-15 MED ORDER — SODIUM CHLORIDE 0.9 % IV SOLN
INTRAVENOUS | Status: DC
  Administered 2015-10-15 (×2): via INTRAVENOUS
  Filled 2015-10-15 (×4): qty 1000

## 2015-10-15 NOTE — Progress Notes (Signed)
  Echocardiogram 2D Echocardiogram has been performed.  Nolon RodBrown, Tony 10/15/2015, 2:44 PM

## 2015-10-15 NOTE — Progress Notes (Signed)
PROGRESS NOTE  Micheal Carey ZOX:[id number] DOB: Apr 12, 1950 DOA: 10/14/2015 PCP: No primary care provider on file.  Brief History:  65 year old male with a history of alcohol dependence, COPD presented with 2 day history of nausea, vomiting, and diarrhea. He states that he had approximately 10-15 episodes of emesis and 5 episodes of diarrhea prior to admission without any hematemesis or hematochezia. Apparently, the patient was in his car with his wife driving and was not feeling well. He took his temperature and it was 101.45F. He told his wife to pull over, and called the ambulance to bring him to the emergency department. The patient states that his wife developed also developed diarrhea the day prior to his admission. Interestingly, the patient states that he recently returned home from [facility]. He denies any other travels or unusual foods. He states that he drinks a couple bottles of wine every other week. His last drink was on 10/10/2015. In addition, the patient has been complaining of intermittent chest discomfort for the past 1-2 years which can occur even with rest. He has no prodromal or associated symptoms. Since admission, the patient was found to have serum creatinine 1.54, to be seen about 1, lactic acid 4.61. The patient was admitted for further hydration and workup secondary to his intractable vomiting and diarrhea.  Assessment/Plan: Intractable vomiting and diarrhea -Suspect nonspecific gastroenteritis -GI pathogen panel -Continue intravenous fluids -Lipase 26  Pulmonary opacity -Likely artifact -Discontinue antibiotics -Patient has no leukocytosis or respiratory symptoms   Lactic acidosis -Secondary to volume depletion from the patient's GI losses -Improving with IV hydration  Transaminasemia -hep b surface antigen -hep c antibody -HIV -RUQ [place]  Atypical chest pain -personally reviewed EKG--sinus, TWI V1, V2 -finish cycling troponins  Renal  insufficiency of unclear duration -No baseline renal function to compare -Continue IV hydration and recheck BMP  Alcohol dependence -CIWA -The patient likely drinks more than he endorses  Hyperglycemia -A1C--4.9  COPD  -Stable on room air  -Albuterol when necessary shortness of breath  -The patient's wife continues to smoke exposing the patient to secondhand smoke     Disposition Plan:   Home in 1-2 days  Family Communication:  No Family at bedside--Total time spent 35 minutes.  Greater than 50% spent face to face counseling and coordinating care.   Consultants:  None  Code Status:  FULL  DVT Prophylaxis:[place] Lovenox   Procedures: As Listed in Progress Note Above  Antibiotics: None    Subjective: Patient continues to have loose stools without any blood. His vomiting seems to be better. Denies any fevers, chills, chest pain, coughing, hemoptysis. Denies any abdominal pain, dysuria, hematuria.  Objective: Vitals:   10/14/15 1727 10/14/15 1837 10/14/15 2124 10/15/15 0552  BP: 136/77 (!) 127/108 136/63 130/70  Pulse: 79 80 73 77  Resp: 19 18 16 17   Temp:  98.2 F (36.8 C) 99.3 F (37.4 C) 97.8 F (36.6 C)  TempSrc:  Oral Oral Oral  SpO2: 99% 95% 98% 96%  Weight:      Height:        Intake/Output Summary (Last 24 hours) at 10/15/15 0839 Last data filed at 10/15/15 0650  Gross per 24 hour  Intake             1910 ml  Output              425 ml  Net  1485 ml   Weight change:  Exam:   General:  Pt is alert, follows commands appropriately, not in acute distress  HEENT: No icterus, No thrush, No neck mass, [place]/AT  Cardiovascular: RRR, S1/S2, no rubs, no gallops  Respiratory: CTA bilaterally, no wheezing, no crackles, no rhonchi  Abdomen: Soft/+BS, non tender, non distended, no guarding  Extremities: No edema, No lymphangitis, No petechiae, No rashes, no synovitis   Data Reviewed: I have personally reviewed following labs and imaging  studies Basic Metabolic Panel:  Recent Labs Lab 10/14/15 1315 10/14/15 1901  NA 142  --   K 3.5  --   CL 105  --   CO2 21*  --   GLUCOSE 137*  --   BUN 34*  --   CREATININE 1.54*  --   CALCIUM 9.0  --   MG  --  1.9  PHOS  --  1.5*   Liver Function Tests:  Recent Labs Lab 10/14/15 1315 10/14/15 1901  AST 72* 64*  ALT 24 21  ALKPHOS 71 58  BILITOT 2.4* 2.0*  PROT 7.5 5.7*  ALBUMIN 4.2 3.2*    Recent Labs Lab 10/14/15 1315  LIPASE 26   No results for input(s): AMMONIA in the last 168 hours. Coagulation Profile: No results for input(s): INR, PROTIME in the last 168 hours. CBC:  Recent Labs Lab 10/14/15 1315  WBC 9.1  NEUTROABS 6.9  HGB 14.4  HCT 40.9  MCV 85.6  PLT 172   Cardiac Enzymes:  Recent Labs Lab 10/14/15 1901 10/14/15 2301  TROPONINI <0.03 <0.03   BNP: Invalid input(s): POCBNP CBG: No results for input(s): GLUCAP in the last 168 hours. HbA1C:  Recent Labs  10/14/15 1901  HGBA1C 4.9   Urine analysis:    Component Value Date/Time   COLORURINE YELLOW 10/14/2015 1554   APPEARANCEUR CLEAR 10/14/2015 1554   LABSPEC 1.020 10/14/2015 1554   PHURINE 6.0 10/14/2015 1554   GLUCOSEU NEGATIVE 10/14/2015 1554   HGBUR NEGATIVE 10/14/2015 1554   BILIRUBINUR SMALL (A) 10/14/2015 1554   KETONESUR 15 (A) 10/14/2015 1554   PROTEINUR NEGATIVE 10/14/2015 1554   NITRITE NEGATIVE 10/14/2015 1554   LEUKOCYTESUR NEGATIVE 10/14/2015 1554   Sepsis Labs: @LABRCNTIP (procalcitonin:4,lacticidven:4) )No results found for this or any previous visit (from the past 240 hour(s)).   Scheduled Meds: . azithromycin  500 mg Intravenous Q24H  . cefTRIAXone (ROCEPHIN)  IV  1 g Intravenous Q24H  . enoxaparin (LOVENOX) injection  30 mg Subcutaneous Q24H  . folic acid  1 mg Oral Daily  . multivitamin with minerals  1 tablet Oral Daily  . sodium chloride flush  3 mL Intravenous Q12H  . thiamine  100 mg Oral Daily   Or  . thiamine  100 mg Intravenous Daily    Continuous Infusions: . sodium chloride 125 mL/hr at 10/15/15 0102    Procedures/Studies: Dg Chest 2 View  Result Date: 10/14/2015 CLINICAL DATA:  Fever. EXAM: CHEST  2 VIEW COMPARISON:  None. FINDINGS: The heart size and mediastinal contours are within normal limits. No pneumothorax or pleural effusion is noted. Right lung is clear. Rounded nodular density seen projected over left lower lobe most consistent with overlying nipple shadow. Otherwise left lung is clear. The visualized skeletal structures are unremarkable. IMPRESSION: Rounded nodular density seen projected over left lower lobe most consistent with overlying nipple shadow. Repeat radiograph with nipple markers is recommended to rule out pulmonary nodule. Otherwise no abnormality seen in the chest. Electronically Signed   By: Fayrene FearingJames  Christen Butter, M.D.   On: 10/14/2015 15:47    Dene Nazir, DO  [facility] Pager [phone]  If 7PM-7AM, please contact night-coverage [email or web address] Password [email or web address] 10/15/2015, 8:39 AM   LOS: 0 days

## 2015-10-16 DIAGNOSIS — N179 Acute kidney failure, unspecified: Principal | ICD-10-CM

## 2015-10-16 DIAGNOSIS — E872 Acidosis: Secondary | ICD-10-CM

## 2015-10-16 LAB — LEGIONELLA PNEUMOPHILA SEROGP 1 UR AG: L. PNEUMOPHILA SEROGP 1 UR AG: NEGATIVE

## 2015-10-16 LAB — COMPREHENSIVE METABOLIC PANEL
ALK PHOS: 63 U/L (ref 38–126)
ALT: 22 U/L (ref 17–63)
AST: 58 U/L — AB (ref 15–41)
Albumin: 3.3 g/dL — ABNORMAL LOW (ref 3.5–5.0)
Anion gap: 9 (ref 5–15)
BILIRUBIN TOTAL: 1.7 mg/dL — AB (ref 0.3–1.2)
BUN: 10 mg/dL (ref 6–20)
CO2: 23 mmol/L (ref 22–32)
CREATININE: 0.79 mg/dL (ref 0.61–1.24)
Calcium: 8 mg/dL — ABNORMAL LOW (ref 8.9–10.3)
Chloride: 104 mmol/L (ref 101–111)
GFR calc Af Amer: >60 mL/min (ref >60)
GLUCOSE: 84 mg/dL (ref 65–99)
POTASSIUM: 3 mmol/L — AB (ref 3.5–5.1)
Sodium: 136 mmol/L (ref 135–145)
Total Protein: 6 g/dL — ABNORMAL LOW (ref 6.5–8.1)

## 2015-10-16 LAB — HEMOGLOBIN A1C
HEMOGLOBIN A1C: 4.7 % — AB (ref 4.8–5.6)
Mean Plasma Glucose: 88 mg/dL

## 2015-10-16 LAB — HIV ANTIBODY (ROUTINE TESTING W REFLEX): HIV Screen 4th Generation wRfx: NONREACTIVE

## 2015-10-16 MED ORDER — INFLUENZA VAC SPLIT QUAD 0.5 ML IM SUSY: 0.5000 mL | PREFILLED_SYRINGE | INTRAMUSCULAR | Status: DC

## 2015-10-16 MED ORDER — POTASSIUM CHLORIDE IN NACL 20-0.9 MEQ/L-% IV SOLN
INTRAVENOUS | Status: DC
  Administered 2015-10-16: 07:00:00 via INTRAVENOUS
  Filled 2015-10-16: qty 1000

## 2015-10-16 MED ORDER — POTASSIUM CHLORIDE CRYS ER 20 MEQ PO TBCR
40.0000 meq | EXTENDED_RELEASE_TABLET | Freq: Once | ORAL | Status: AC
  Administered 2015-10-16: 40 meq via ORAL
  Filled 2015-10-16: qty 2

## 2015-10-16 NOTE — Discharge Summary (Signed)
Physician Discharge Summary  Micheal Carey ZOX:[id number] DOB: Jun 17, 1950 DOA: 10/14/2015  PCP: No primary care provider on file.  Admit date: 10/14/2015 Discharge date: 10/16/2015  Admitted From:  Home Disposition:  Home  Recommendations for Outpatient Follow-up:  1. Follow up with PCP in 1-2 weeks 2. Please obtain BMP/CBC in one week  Home Health:No Equipment/Devices:None  Discharge Condition:stable CODE STATUS:FULL Diet recommendation: Heart Healthy   Brief/Interim Summary: 65 year old male with a history of alcohol dependence, COPD presented with 2 day history of nausea, vomiting, and diarrhea. He states that he had approximately 10-15 episodes of emesis and 5 episodes of diarrhea prior to admission without any hematemesis or hematochezia. Apparently, the patient was in his car with his wife driving and was not feeling well. He took his temperature and it was 101.45F. He told his wife to pull over, and called the ambulance to bring him to the emergency department. The patient states that his wife developed also developed diarrhea the day prior to his admission. Interestingly, the patient states that he recently returned home from [facility]. He denies any other travels or unusual foods. He states that he drinks a couple bottles of wine every other week. His last drink was on 10/10/2015. In addition, the patient has been complaining of intermittent chest discomfort for the past 1-2 years which can occur even with rest. He has no prodromal or associated symptoms. Since admission, the patient was found to have serum creatinine 1.54, to be seen about 1, lactic acid 4.61. The patient was admitted for further hydration and workup secondary to his intractable vomiting and diarrhea.  The patient improved significantly faster than anticipated with routine medical treatment  Discharge Diagnoses:  Intractable vomiting and diarrhea -Suspect nonspecific gastroenteritis -GI pathogen  panel--Negative -Continue intravenous fluids -Lipase 26 -Within 24-36 hours, the patient's clinical symptoms improved significantly. His diet was advanced which she tolerated. He did not have any further vomiting or diarrhea.  Pulmonary opacity -Likely artifact -Discontinue antibiotics--> patient remained clinically stable -Patient has no leukocytosis or respiratory symptoms   AKI -Secondary to volume depletion -Improved with intravenous fluids -Serum creatinine 0.79 on the day of discharge  Lactic acidosis -Secondary to volume depletion from the patient's GI losses -Improving with IV hydration  Transaminasemia -hep b surface antigen--negative -hep c antibody--negative -HIV--negative -RUQ US--hepatic steatosis -likely due to hepatic steatosis superimposed upon Etoh  Atypical chest pain -personally reviewed EKG--sinus, TWI V1, V2 -finish cycling troponins--neg x 3  Alcohol dependence -CIWA -The patient likely drinks more than he endorses -No symptoms of withdrawal  Hyperglycemia -A1C--4.9  COPD  -Stable on room air  -Albuterol when necessary shortness of breath  -The patient's wife continues to smoke exposing the patient to secondhand smoke  Hypokalemia -Repleted   Discharge Instructions      Consultations:  none   Procedures/Studies: Dg Chest 2 View  Result Date: 10/15/2015 CLINICAL DATA:  Fever, shortness of breath and pneumonia. EXAM: CHEST  2 VIEW COMPARISON:  PA and lateral chest 10/14/2015. FINDINGS: Lungs are clear. Likely nipple shadow on the left is again seen. Heart size is normal. No pneumothorax or pleural effusion. No bony abnormality. IMPRESSION: No acute disease. Likely nipple shadow on the left is again seen. As noted on the prior exam, repeat PA film with nipple marker in place could be used for confirmation. Electronically Signed   By: Drusilla Kanner M.D.   On: 10/15/2015 09:10   Dg Chest 2 View  Result Date:  10/14/2015 CLINICAL DATA:  Fever.  EXAM: CHEST  2 VIEW COMPARISON:  None. FINDINGS: The heart size and mediastinal contours are within normal limits. No pneumothorax or pleural effusion is noted. Right lung is clear. Rounded nodular density seen projected over left lower lobe most consistent with overlying nipple shadow. Otherwise left lung is clear. The visualized skeletal structures are unremarkable. IMPRESSION: Rounded nodular density seen projected over left lower lobe most consistent with overlying nipple shadow. Repeat radiograph with nipple markers is recommended to rule out pulmonary nodule. Otherwise no abnormality seen in the chest. Electronically Signed   By: Lupita RaiderJames  Green Jr, M.D.   On: 10/14/2015 15:47   [place] Abdomen Limited Ruq  Result Date: 10/15/2015 CLINICAL DATA:  Transaminasemia. EXAM: US ABDOMEN LIMITED - RIGHT UPPER QUADRANT COMPARISON:  None. FINDINGS: Gallbladder: No gallstones or wall thickening visualized. No sonographic Murphy sign noted by sonographer. Common bile duct: Diameter: 4 mm which is within normal limits. Liver: No focal lesion identified. Increased echogenicity of hepatic parenchyma is noted consistent with fatty infiltration. IMPRESSION: Increased echogenicity of hepatic parenchyma suggesting fatty infiltration. No other abnormality seen in the right upper quadrant of the abdomen. Electronically Signed   By: Lupita RaiderJames  Green Jr, M.D.   On: 10/15/2015 15:14        Discharge Exam: Vitals:   10/16/15 0226 10/16/15 0512  BP: (!) 141/81 135/80  Pulse: 86 82  Resp: 18 18  Temp:  98.2 F (36.8 C)   Vitals:   10/15/15 1829 10/16/15 0004 10/16/15 0226 10/16/15 0512  BP: (!) 154/68 (!) 123/56 (!) 141/81 135/80  Pulse: 65 65 86 82  Resp: 15 18 18 18   Temp: 98.1 F (36.7 C) 98.4 F (36.9 C)  98.2 F (36.8 C)  TempSrc: Oral Oral  Oral  SpO2: 100% 98% 95% 99%  Weight:      Height:        General: Pt is alert, awake, not in acute distress Cardiovascular: RRR, S1/S2  +, no rubs, no gallops Respiratory: CTA bilaterally, no wheezing, no rhonchi Abdominal: Soft, NT, ND, bowel sounds + Extremities: no edema, no cyanosis   The results of significant diagnostics from this hospitalization (including imaging, microbiology, ancillary and laboratory) are listed below for reference.    Significant Diagnostic Studies: Dg Chest 2 View  Result Date: 10/15/2015 CLINICAL DATA:  Fever, shortness of breath and pneumonia. EXAM: CHEST  2 VIEW COMPARISON:  PA and lateral chest 10/14/2015. FINDINGS: Lungs are clear. Likely nipple shadow on the left is again seen. Heart size is normal. No pneumothorax or pleural effusion. No bony abnormality. IMPRESSION: No acute disease. Likely nipple shadow on the left is again seen. As noted on the prior exam, repeat PA film with nipple marker in place could be used for confirmation. Electronically Signed   By: Drusilla Kannerhomas  Dalessio M.D.   On: 10/15/2015 09:10   Dg Chest 2 View  Result Date: 10/14/2015 CLINICAL DATA:  Fever. EXAM: CHEST  2 VIEW COMPARISON:  None. FINDINGS: The heart size and mediastinal contours are within normal limits. No pneumothorax or pleural effusion is noted. Right lung is clear. Rounded nodular density seen projected over left lower lobe most consistent with overlying nipple shadow. Otherwise left lung is clear. The visualized skeletal structures are unremarkable. IMPRESSION: Rounded nodular density seen projected over left lower lobe most consistent with overlying nipple shadow. Repeat radiograph with nipple markers is recommended to rule out pulmonary nodule. Otherwise no abnormality seen in the chest. Electronically Signed   By: Lupita RaiderJames  Green Jr, M.D.  On: 10/14/2015 15:47   US Abdomen Limited Ruq  Result Date: 10/15/2015 CLINICAL DATA:  Transaminasemia. EXAM: US ABDOMEN LIMITED - RIGHT UPPER QUADRANT COMPARISON:  None. FINDINGS: Gallbladder: No gallstones or wall thickening visualized. No sonographic Murphy sign noted by  sonographer. Common bile duct: Diameter: 4 mm which is within normal limits. Liver: No focal lesion identified. Increased echogenicity of hepatic parenchyma is noted consistent with fatty infiltration. IMPRESSION: Increased echogenicity of hepatic parenchyma suggesting fatty infiltration. No other abnormality seen in the right upper quadrant of the abdomen. Electronically Signed   By: Lupita Raider, M.D.   On: 10/15/2015 15:14     Microbiology: Recent Results (from the past 240 hour(s))  Blood Culture (routine x 2)     Status: None (Preliminary result)   Collection Time: 10/14/15  1:10 PM  Result Value Ref Range Status   Specimen Description BLOOD RIGHT FOREARM  Final   Special Requests BOTTLES DRAWN AEROBIC AND ANAEROBIC 5CC  Final   Culture NO GROWTH 2 DAYS  Final   Report Status PENDING  Incomplete  Blood Culture (routine x 2)     Status: None (Preliminary result)   Collection Time: 10/14/15  2:07 PM  Result Value Ref Range Status   Specimen Description BLOOD RIGHT HAND  Final   Special Requests BOTTLES DRAWN AEROBIC AND ANAEROBIC 5CC  Final   Culture NO GROWTH 2 DAYS  Final   Report Status PENDING  Incomplete  Urine culture     Status: None   Collection Time: 10/14/15  3:54 PM  Result Value Ref Range Status   Specimen Description URINE, CLEAN CATCH  Final   Special Requests NONE  Final   Culture NO GROWTH  Final   Report Status 10/15/2015 FINAL  Final  Gastrointestinal Panel by PCR , Stool     Status: None   Collection Time: 10/15/15  6:16 AM  Result Value Ref Range Status   Campylobacter species NOT DETECTED NOT DETECTED Final   Plesimonas shigelloides NOT DETECTED NOT DETECTED Final   Salmonella species NOT DETECTED NOT DETECTED Final   Yersinia enterocolitica NOT DETECTED NOT DETECTED Final   Vibrio species NOT DETECTED NOT DETECTED Final   Vibrio cholerae NOT DETECTED NOT DETECTED Final   Enteroaggregative E coli (EAEC) NOT DETECTED NOT DETECTED Final   Enteropathogenic  E coli (EPEC) NOT DETECTED NOT DETECTED Final   Enterotoxigenic E coli (ETEC) NOT DETECTED NOT DETECTED Final   Shiga like toxin producing E coli (STEC) NOT DETECTED NOT DETECTED Final   Shigella/Enteroinvasive E coli (EIEC) NOT DETECTED NOT DETECTED Final   Cryptosporidium NOT DETECTED NOT DETECTED Final   Cyclospora cayetanensis NOT DETECTED NOT DETECTED Final   Entamoeba histolytica NOT DETECTED NOT DETECTED Final   Giardia lamblia NOT DETECTED NOT DETECTED Final   Adenovirus F40/41 NOT DETECTED NOT DETECTED Final   Astrovirus NOT DETECTED NOT DETECTED Final   Norovirus GI/GII NOT DETECTED NOT DETECTED Final   Rotavirus A NOT DETECTED NOT DETECTED Final   Sapovirus (I, II, IV, and V) NOT DETECTED NOT DETECTED Final     Labs: Basic Metabolic Panel:  Recent Labs Lab 10/14/15 1315 10/14/15 1901 10/15/15 0906 10/16/15 0459  NA 142  --  139 136  K 3.5  --  3.0* 3.0*  CL 105  --  108 104  CO2 21*  --  23 23  GLUCOSE 137*  --  88 84  BUN 34*  --  16 10  CREATININE 1.54*  --  0.86 0.79  CALCIUM 9.0  --  7.6* 8.0*  MG  --  1.9  --   --   PHOS  --  1.5*  --   --    Liver Function Tests:  Recent Labs Lab 10/14/15 1315 10/14/15 1901 10/15/15 0906 10/16/15 0459  AST 72* 64* 64* 58*  ALT 24 21 21 22   ALKPHOS 71 58 58 63  BILITOT 2.4* 2.0* 2.0* 1.7*  PROT 7.5 5.7* 5.9* 6.0*  ALBUMIN 4.2 3.2* 3.2* 3.3*    Recent Labs Lab 10/14/15 1315  LIPASE 26   No results for input(s): AMMONIA in the last 168 hours. CBC:  Recent Labs Lab 10/14/15 1315 10/15/15 0743  WBC 9.1 5.7  NEUTROABS 6.9  --   HGB 14.4 12.5*  HCT 40.9 38.2*  MCV 85.6 88.4  PLT 172 131*   Cardiac Enzymes:  Recent Labs Lab 10/14/15 1901 10/14/15 2301 10/15/15 0906  TROPONINI <0.03 <0.03 <0.03   BNP: Invalid input(s): POCBNP CBG: No results for input(s): GLUCAP in the last 168 hours.  Time coordinating discharge:  Greater than 30 minutes  Signed:  Ajahnae Rathgeber, DO [facility] Pager: [phone] 10/16/2015, 1:25 PM

## 2015-10-19 LAB — CULTURE, BLOOD (ROUTINE X 2)
CULTURE: NO GROWTH
Culture: NO GROWTH

## 2015-12-03 ENCOUNTER — Inpatient Hospital Stay (HOSPITAL_COMMUNITY)
Admission: EM | Admit: 2015-12-03 | Discharge: 2015-12-05 | DRG: 894 | Discharge status: Against Medical Advice | Payer: Medicare Other | Attending: Family Medicine | Admitting: Family Medicine

## 2015-12-03 ENCOUNTER — Emergency Department (HOSPITAL_COMMUNITY): Payer: Medicare Other

## 2015-12-03 ENCOUNTER — Encounter (HOSPITAL_COMMUNITY): Payer: Self-pay | Admitting: Emergency Medicine

## 2015-12-03 DIAGNOSIS — R740 Nonspecific elevation of levels of transaminase and lactic acid dehydrogenase [LDH]: Secondary | ICD-10-CM | POA: Diagnosis not present

## 2015-12-03 DIAGNOSIS — E860 Dehydration: Secondary | ICD-10-CM | POA: Diagnosis present

## 2015-12-03 DIAGNOSIS — A419 Sepsis, unspecified organism: Secondary | ICD-10-CM | POA: Diagnosis not present

## 2015-12-03 DIAGNOSIS — E162 Hypoglycemia, unspecified: Secondary | ICD-10-CM | POA: Diagnosis present

## 2015-12-03 DIAGNOSIS — E872 Acidosis, unspecified: Secondary | ICD-10-CM | POA: Diagnosis present

## 2015-12-03 DIAGNOSIS — G43A1 Cyclical vomiting, intractable: Secondary | ICD-10-CM

## 2015-12-03 DIAGNOSIS — R7401 Elevation of levels of liver transaminase levels: Secondary | ICD-10-CM | POA: Diagnosis present

## 2015-12-03 DIAGNOSIS — E43 Unspecified severe protein-calorie malnutrition: Secondary | ICD-10-CM | POA: Diagnosis present

## 2015-12-03 DIAGNOSIS — R112 Nausea with vomiting, unspecified: Secondary | ICD-10-CM | POA: Diagnosis not present

## 2015-12-03 DIAGNOSIS — K529 Noninfective gastroenteritis and colitis, unspecified: Secondary | ICD-10-CM | POA: Diagnosis present

## 2015-12-03 DIAGNOSIS — Z681 Body mass index (BMI) 19 or less, adult: Secondary | ICD-10-CM

## 2015-12-03 DIAGNOSIS — R197 Diarrhea, unspecified: Secondary | ICD-10-CM

## 2015-12-03 DIAGNOSIS — E876 Hypokalemia: Secondary | ICD-10-CM | POA: Diagnosis not present

## 2015-12-03 DIAGNOSIS — E875 Hyperkalemia: Secondary | ICD-10-CM | POA: Diagnosis present

## 2015-12-03 DIAGNOSIS — J410 Simple chronic bronchitis: Secondary | ICD-10-CM

## 2015-12-03 DIAGNOSIS — F1010 Alcohol abuse, uncomplicated: Secondary | ICD-10-CM | POA: Diagnosis not present

## 2015-12-03 DIAGNOSIS — J449 Chronic obstructive pulmonary disease, unspecified: Secondary | ICD-10-CM | POA: Diagnosis present

## 2015-12-03 LAB — COMPREHENSIVE METABOLIC PANEL
ALBUMIN: 4.6 g/dL (ref 3.5–5.0)
ALK PHOS: 90 U/L (ref 38–126)
ALT: 63 U/L (ref 17–63)
ANION GAP: 24 — AB (ref 5–15)
AST: 280 U/L — ABNORMAL HIGH (ref 15–41)
BILIRUBIN TOTAL: 1.3 mg/dL — AB (ref 0.3–1.2)
BUN: 21 mg/dL — AB (ref 6–20)
CALCIUM: 9.4 mg/dL (ref 8.9–10.3)
CO2: 13 mmol/L — ABNORMAL LOW (ref 22–32)
CREATININE: 1.19 mg/dL (ref 0.61–1.24)
Chloride: 103 mmol/L (ref 101–111)
GFR calc Af Amer: >60 mL/min (ref >60)
GFR calc non Af Amer: >60 mL/min (ref >60)
GLUCOSE: 254 mg/dL — AB (ref 65–99)
Potassium: 5.2 mmol/L — ABNORMAL HIGH (ref 3.5–5.1)
Sodium: 140 mmol/L (ref 135–145)
TOTAL PROTEIN: 7.9 g/dL (ref 6.5–8.1)

## 2015-12-03 LAB — URINALYSIS, ROUTINE W REFLEX MICROSCOPIC
BILIRUBIN URINE: NEGATIVE
Glucose, UA: 500 mg/dL — AB
HGB URINE DIPSTICK: NEGATIVE
Leukocytes, UA: NEGATIVE
NITRITE: NEGATIVE
PROTEIN: NEGATIVE mg/dL
Specific Gravity, Urine: 1.02 (ref 1.005–1.030)
pH: 6 (ref 5.0–8.0)

## 2015-12-03 LAB — I-STAT CHEM 8, ED
BUN: 28 mg/dL — ABNORMAL HIGH (ref 6–20)
CALCIUM ION: 1.06 mmol/L — AB (ref 1.15–1.40)
CREATININE: 1 mg/dL (ref 0.61–1.24)
Chloride: 108 mmol/L (ref 101–111)
GLUCOSE: 188 mg/dL — AB (ref 65–99)
HCT: 44 % (ref 39.0–52.0)
HEMOGLOBIN: 15 g/dL (ref 13.0–17.0)
Potassium: 5 mmol/L (ref 3.5–5.1)
Sodium: 138 mmol/L (ref 135–145)
TCO2: 15 mmol/L (ref 0–100)

## 2015-12-03 LAB — RAPID URINE DRUG SCREEN, HOSP PERFORMED
AMPHETAMINES: NOT DETECTED
BENZODIAZEPINES: NOT DETECTED
Barbiturates: NOT DETECTED
COCAINE: NOT DETECTED
Opiates: NOT DETECTED
Tetrahydrocannabinol: POSITIVE — AB

## 2015-12-03 LAB — CBC
HCT: 41.9 % (ref 39.0–52.0)
HEMOGLOBIN: 14.1 g/dL (ref 13.0–17.0)
MCH: 29.9 pg (ref 26.0–34.0)
MCHC: 33.7 g/dL (ref 30.0–36.0)
MCV: 88.8 fL (ref 78.0–100.0)
PLATELETS: 233 10*3/uL (ref 150–400)
RBC: 4.72 MIL/uL (ref 4.22–5.81)
RDW: 13.9 % (ref 11.5–15.5)
WBC: 13.4 10*3/uL — ABNORMAL HIGH (ref 4.0–10.5)

## 2015-12-03 LAB — PROCALCITONIN: PROCALCITONIN: 0.28 ng/mL

## 2015-12-03 LAB — I-STAT CG4 LACTIC ACID, ED
LACTIC ACID, VENOUS: 7.49 mmol/L — AB (ref 0.5–1.9)
LACTIC ACID, VENOUS: 2.1 mmol/L — AB (ref 0.5–1.9)

## 2015-12-03 LAB — TROPONIN I: Troponin I: <0.03 ng/mL (ref <0.03)

## 2015-12-03 LAB — LIPASE, BLOOD: Lipase: 22 U/L (ref 11–51)

## 2015-12-03 MED ORDER — LORAZEPAM 2 MG/ML IJ SOLN: 1.0000 mg | Freq: Four times a day (QID) | INTRAMUSCULAR | Status: DC | PRN

## 2015-12-03 MED ORDER — VANCOMYCIN HCL IN DEXTROSE 1-5 GM/200ML-% IV SOLN
1000.0000 mg | Freq: Once | INTRAVENOUS | Status: AC
  Administered 2015-12-03: 1000 mg via INTRAVENOUS
  Filled 2015-12-03: qty 200

## 2015-12-03 MED ORDER — ADULT MULTIVITAMIN W/MINERALS CH
1.0000 | ORAL_TABLET | Freq: Every day | ORAL | Status: DC
  Administered 2015-12-04 – 2015-12-05 (×2): 1 via ORAL
  Filled 2015-12-03 (×2): qty 1

## 2015-12-03 MED ORDER — MORPHINE SULFATE (PF) 2 MG/ML IV SOLN
4.0000 mg | Freq: Once | INTRAVENOUS | Status: AC
  Administered 2015-12-03: 4 mg via INTRAVENOUS
  Filled 2015-12-03: qty 2

## 2015-12-03 MED ORDER — THIAMINE HCL 100 MG/ML IJ SOLN: 100.0000 mg | Freq: Every day | INTRAMUSCULAR | Status: DC

## 2015-12-03 MED ORDER — ONDANSETRON HCL 4 MG/2ML IJ SOLN
4.0000 mg | Freq: Once | INTRAMUSCULAR | Status: AC | PRN
  Administered 2015-12-03: 4 mg via INTRAVENOUS
  Filled 2015-12-03: qty 2

## 2015-12-03 MED ORDER — FOLIC ACID 1 MG PO TABS
1.0000 mg | ORAL_TABLET | Freq: Every day | ORAL | Status: DC
  Administered 2015-12-04 – 2015-12-05 (×2): 1 mg via ORAL
  Filled 2015-12-03 (×2): qty 1

## 2015-12-03 MED ORDER — SODIUM CHLORIDE 0.9 % IV BOLUS (SEPSIS)
1000.0000 mL | Freq: Once | INTRAVENOUS | Status: AC
  Administered 2015-12-03: 1000 mL via INTRAVENOUS

## 2015-12-03 MED ORDER — VANCOMYCIN HCL IN DEXTROSE 750-5 MG/150ML-% IV SOLN
750.0000 mg | Freq: Two times a day (BID) | INTRAVENOUS | Status: DC
  Administered 2015-12-04 (×2): 750 mg via INTRAVENOUS
  Filled 2015-12-03 (×3): qty 150

## 2015-12-03 MED ORDER — PIPERACILLIN-TAZOBACTAM 3.375 G IVPB
3.3750 g | Freq: Three times a day (TID) | INTRAVENOUS | Status: DC
  Administered 2015-12-04 – 2015-12-05 (×4): 3.375 g via INTRAVENOUS
  Filled 2015-12-03 (×5): qty 50

## 2015-12-03 MED ORDER — ONDANSETRON HCL 4 MG/2ML IJ SOLN
4.0000 mg | Freq: Once | INTRAMUSCULAR | Status: AC
  Administered 2015-12-03: 4 mg via INTRAVENOUS
  Filled 2015-12-03: qty 2

## 2015-12-03 MED ORDER — VITAMIN B-1 100 MG PO TABS
100.0000 mg | ORAL_TABLET | Freq: Every day | ORAL | Status: DC
  Administered 2015-12-04 – 2015-12-05 (×2): 100 mg via ORAL
  Filled 2015-12-03 (×2): qty 1

## 2015-12-03 MED ORDER — ACETAMINOPHEN 325 MG PO TABS
650.0000 mg | ORAL_TABLET | Freq: Once | ORAL | Status: AC
Start: 2015-12-03 — End: 2015-12-03
  Administered 2015-12-03: 650 mg via ORAL
  Filled 2015-12-03: qty 2

## 2015-12-03 MED ORDER — SODIUM CHLORIDE 0.9 % IV BOLUS (SEPSIS)
250.0000 mL | Freq: Once | INTRAVENOUS | Status: AC
  Administered 2015-12-03: 250 mL via INTRAVENOUS

## 2015-12-03 MED ORDER — LORAZEPAM 1 MG PO TABS: 1.0000 mg | ORAL_TABLET | Freq: Four times a day (QID) | ORAL | Status: DC | PRN

## 2015-12-03 MED ORDER — SODIUM CHLORIDE 0.9 % IV BOLUS (SEPSIS)
1000.0000 mL | Freq: Once | INTRAVENOUS | Status: AC
  Administered 2015-12-03 (×2): 1000 mL via INTRAVENOUS

## 2015-12-03 MED ORDER — PIPERACILLIN-TAZOBACTAM 3.375 G IVPB 30 MIN
3.3750 g | Freq: Once | INTRAVENOUS | Status: AC
  Administered 2015-12-03: 3.375 g via INTRAVENOUS
  Filled 2015-12-03: qty 50

## 2015-12-03 MED ORDER — SODIUM CHLORIDE 0.9 % IV BOLUS (SEPSIS)
500.0000 mL | Freq: Once | INTRAVENOUS | Status: AC
  Administered 2015-12-03: 500 mL via INTRAVENOUS

## 2015-12-03 NOTE — ED Notes (Signed)
ED Provider at bedside. 

## 2015-12-03 NOTE — ED Notes (Signed)
Bed: WA25 Expected date:  Expected time:  Means of arrival:  Comments: EMS  

## 2015-12-03 NOTE — ED Notes (Signed)
BOTH cultures drawn at present time: R hand and R fa.

## 2015-12-03 NOTE — ED Notes (Signed)
Patient transported to X-ray 

## 2015-12-03 NOTE — H&P (Signed)
Micheal LampHarvey Molchan ZOX:[id number] DOB: 09/17/1950 DOA: 12/03/2015     PCP: No primary care provider on file.   Outpatient Specialists: none  Patient coming from:  home Lives   With family    Chief Complaint: Nausea vomiting diarrhea  HPI: Micheal Carey is a 65 y.o. male with medical history significant of alcohol dependence without history of withdrawals, COPD      Presented with a few days of nausea vomiting and diarrhea his been feeling lightheaded  Patient 3 weeks couple bottles of wine every other week he have had some alcohol tonight. Patient endorses chills although no fever. EMS was called to his house was noted that his CBG was down to 60 to a given D10 and CBG improved up to 290. Patient describes flulike symptoms diarrhea has been nonbloody and watery no blood present in emesis he has not attempted to use any medications for his symptoms this was very sudden in onset he never measured his temperature but he feels that maybe he had subjective fever and denies any recent travel besides in [place] [facility] [place]. denies any chest pain, no shortness of breath.   Regarding pertinent Chronic problems: In September patient had similar admission for up to 10-15 episodes of nausea and vomiting and lactic acidosis at that time he was admitted and lactic acidosis has resolved with fluid rehydration. She was febrile he had recent trip to last for this otherwise has had no recent travel. He was diagnosed with nonspecific gastroenteritis GI pathogen panel is negative he was rehydrated lipase was unremarkable  IN ER:  Temp (24hrs), Avg:99.5 F (37.5 C), Min:98.2 F (36.8 C), Max:100.3 F (37.9 C)      RR 17 HR 97 BP 146/77 Initial lactic acid 7.4 9 repeat 2.1 troponin 0.03 CR 1.0 potassium 5.2 WBC 13.4 Chest x-ray unremarkable Following Medications were ordered in ER: Medications  piperacillin-tazobactam (ZOSYN) IVPB 3.375 g (not administered)  vancomycin (VANCOCIN) IVPB 750 mg/150  ml premix (not administered)  ondansetron (ZOFRAN) injection 4 mg (4 mg Intravenous Given 12/03/15 1735)  ondansetron (ZOFRAN) injection 4 mg (4 mg Intravenous Given 12/03/15 1843)  sodium chloride 0.9 % bolus 1,000 mL (0 mLs Intravenous Stopped 12/03/15 1955)    And  sodium chloride 0.9 % bolus 500 mL (500 mLs Intravenous New Bag/Given 12/03/15 2007)    And  sodium chloride 0.9 % bolus 250 mL (250 mLs Intravenous New Bag/Given 12/03/15 2008)  piperacillin-tazobactam (ZOSYN) IVPB 3.375 g (0 g Intravenous Stopped 12/03/15 1955)  vancomycin (VANCOCIN) IVPB 1000 mg/200 mL premix (1,000 mg Intravenous New Bag/Given 12/03/15 2007)  acetaminophen (TYLENOL) tablet 650 mg (650 mg Oral Given 12/03/15 1910)  morphine 2 MG/ML injection 4 mg (4 mg Intravenous Given 12/03/15 2023)     Hospitalist was called for admission for Sepsis versus dehydration secondary to gastroenteritis  Review of Systems:    Pertinent positives include: Fevers, chills, fatigue,  abdominal pain, nausea, vomiting, diarrhea,  Constitutional:  No weight loss, night sweats, weight loss  HEENT:  No headaches, Difficulty swallowing,Tooth/dental problems,Sore throat,  No sneezing, itching, ear ache, nasal congestion, post nasal drip,  Cardio-vascular:  No chest pain, Orthopnea, PND, anasarca, dizziness, palpitations.no Bilateral lower extremity swelling  GI:  No heartburn, indigestion, change in bowel habits, loss of appetite, melena, blood in stool, hematemesis Resp:  no shortness of breath at rest. No dyspnea on exertion, No excess mucus, no productive cough, No non-productive cough, No coughing up of blood.No change in color of  mucus.No wheezing. Skin:  no rash or lesions. No jaundice GU:  no dysuria, change in color of urine, no urgency or frequency. No straining to urinate.  No flank pain.  Musculoskeletal:  No joint pain or no joint swelling. No decreased range of motion. No back pain.  Psych:  No change in mood or affect.  No depression or anxiety. No memory loss.  Neuro: no localizing neurological complaints, no tingling, no weakness, no double vision, no gait abnormality, no slurred speech, no confusion  As per HPI otherwise 10 point review of systems negative.   Past Medical History: Past Medical History:  Diagnosis Date  . CAP (community acquired pneumonia) 10/14/2015  . Pneumonia    "twice before" (10/14/2015)   Past Surgical History:  Procedure Laterality Date  . JOINT REPLACEMENT    . TOTAL HIP ARTHROPLASTY Right      Social History:  Ambulatory independently      reports that he has never smoked. He has never used smokeless tobacco. He reports that he drinks alcohol. His drug history is not on file.  Allergies:  No Known Allergies  Family History:   Family History  Problem Relation Age of Onset  . Diabetes Mother   . Cancer Mother   . Diabetes Father   . Stroke Neg Hx   . CAD Neg Hx     Medications: Prior to Admission medications   Medication Sig Start Date End Date Taking? Authorizing Provider  albuterol (PROVENTIL HFA;VENTOLIN HFA) 108 (90 Base) MCG/ACT inhaler Inhale 2 puffs into the lungs every 6 (six) hours as needed for wheezing or shortness of breath.   Yes Historical Provider, MD  albuterol (PROVENTIL) (2.5 MG/3ML) 0.083% nebulizer solution Take 2.5 mg by nebulization every 6 (six) hours as needed for wheezing or shortness of breath.   Yes Historical Provider, MD  budesonide-formoterol (SYMBICORT) 160-4.5 MCG/ACT inhaler Inhale 2 puffs into the lungs 2 (two) times daily.   Yes Historical Provider, MD  Cholecalciferol (VITAMIN D PO) Take 1 tablet by mouth daily.   Yes Historical Provider, MD  Multiple Vitamins-Minerals (MULTIVITAMIN WITH MINERALS) tablet Take 1 tablet by mouth daily.   Yes Historical Provider, MD    Physical Exam: Patient Vitals for the past 24 hrs:  BP Temp Temp src Pulse Resp SpO2 Height Weight  12/03/15 2119 - - - - - 95 % - -  12/03/15 2115 138/83  - - 96 16 - - -  12/03/15 2100 143/71 - - 93 12 - - -  12/03/15 2030 151/68 - - 95 15 - - -  12/03/15 2010 141/80 98.7 F (37.1 C) Oral 95 19 99 % - -  12/03/15 1912 144/77 - - 117 18 99 % - -  12/03/15 1900 144/77 - - 109 16 - - -  12/03/15 1855 - - - - - - 5\' 8"  (1.727 m) 59.9 kg (132 lb)  12/03/15 1843 - 100.3 F (37.9 C) Rectal - - - - -  12/03/15 1800 153/87 - - 120 19 100 % - -  12/03/15 1735 - 100.1 F (37.8 C) Rectal - - - - -  12/03/15 1735 - 100.1 F (37.8 C) Rectal - - - - -  12/03/15 1715 160/78 98.2 F (36.8 C) Oral 102 18 97 % - -  12/03/15 1712 - - - - - 100 % - -    1. General:  in No Acute distress 2. Psychological: Alert and   Oriented 3. Head/ENT:  Dry Mucous Membranes                          Head Non traumatic, neck supple                           Poor Dentition 4. SKIN:  decreased Skin turgor,  Skin clean Dry and intact no rash 5. Heart: Regular rate and rhythm no  Murmur, Rub or gallop 6. Lungs:  lear to auscultation bilaterally, no wheezes or crackles   7. Abdomen: Soft, mildly tender, Non distended 8. Lower extremities: no clubbing, cyanosis, or edema 9. Neurologically Grossly intact, moving all 4 extremities equally 10. MSK: Normal range of motion   body mass index is 20.07 kg/m.  Labs on Admission:   Labs on Admission: I have personally reviewed following labs and imaging studies  CBC:  Recent Labs Lab 12/03/15 1728 12/03/15 1843  WBC 13.4*  --   HGB 14.1 15.0  HCT 41.9 44.0  MCV 88.8  --   PLT 233  --    Basic Metabolic Panel:  Recent Labs Lab 12/03/15 1728 12/03/15 1843  NA 140 138  K 5.2* 5.0  CL 103 108  CO2 13*  --   GLUCOSE 254* 188*  BUN 21* 28*  CREATININE 1.19 1.00  CALCIUM 9.4  --    GFR: Estimated Creatinine Clearance: 64.1 mL/min (by C-G formula based on SCr of 1 mg/dL). Liver Function Tests:  Recent Labs Lab 12/03/15 1728  AST 280*  ALT 63  ALKPHOS 90  BILITOT 1.3*  PROT 7.9  ALBUMIN 4.6     Recent Labs Lab 12/03/15 1728  LIPASE 22   No results for input(s): AMMONIA in the last 168 hours. Coagulation Profile: No results for input(s): INR, PROTIME in the last 168 hours. Cardiac Enzymes:  Recent Labs Lab 12/03/15 2105  TROPONINI <0.03   BNP (last 3 results) No results for input(s): PROBNP in the last 8760 hours. HbA1C: No results for input(s): HGBA1C in the last 72 hours. CBG: No results for input(s): GLUCAP in the last 168 hours. Lipid Profile: No results for input(s): CHOL, HDL, LDLCALC, TRIG, CHOLHDL, LDLDIRECT in the last 72 hours. Thyroid Function Tests: No results for input(s): TSH, T4TOTAL, FREET4, T3FREE, THYROIDAB in the last 72 hours. Anemia Panel: No results for input(s): VITAMINB12, FOLATE, FERRITIN, TIBC, IRON, RETICCTPCT in the last 72 hours. Urine analysis:    Component Value Date/Time   COLORURINE YELLOW 12/03/2015 2015   APPEARANCEUR CLEAR 12/03/2015 2015   LABSPEC 1.020 12/03/2015 2015   PHURINE 6.0 12/03/2015 2015   GLUCOSEU 500 (A) 12/03/2015 2015   HGBUR NEGATIVE 12/03/2015 2015   BILIRUBINUR NEGATIVE 12/03/2015 2015   KETONESUR >80 (A) 12/03/2015 2015   PROTEINUR NEGATIVE 12/03/2015 2015   NITRITE NEGATIVE 12/03/2015 2015   LEUKOCYTESUR NEGATIVE 12/03/2015 2015   Sepsis Labs: @LABRCNTIP (procalcitonin:4,lacticidven:4) ) Recent Results (from the past 240 hour(s))  Blood Culture (routine x 2)     Status: None (Preliminary result)   Collection Time: 12/03/15  6:35 PM  Result Value Ref Range Status   Specimen Description BLOOD RIGHT HAND  Final   Special Requests BOTTLES DRAWN AEROBIC AND ANAEROBIC 5CC  Final   Culture PENDING  Incomplete   Report Status PENDING  Incomplete  Blood Culture (routine x 2)     Status: None (Preliminary result)   Collection Time: 12/03/15  6:40 PM  Result Value Ref Range  Status   Specimen Description BLOOD RIGHT HAND  Final   Special Requests BOTTLES DRAWN AEROBIC ONLY 2CC  Final   Culture PENDING   Incomplete   Report Status PENDING  Incomplete      UA  no evidence of UTI     Lab Results  Component Value Date   HGBA1C 4.7 (L) 10/15/2015    Estimated Creatinine Clearance: 64.1 mL/min (by C-G formula based on SCr of 1 mg/dL).  BNP (last 3 results) No results for input(s): PROBNP in the last 8760 hours.   ECG REPORT  Independently reviewed Rate:112  Rhythm: Sinus tachycardia ST&T Change: No acute ischemic changes   QTC 470  Filed Weights   12/03/15 1855  Weight: 59.9 kg (132 lb)     Cultures:    Component Value Date/Time   SDES BLOOD RIGHT HAND 12/03/2015 1840   SPECREQUEST BOTTLES DRAWN AEROBIC ONLY 2CC 12/03/2015 1840   CULT PENDING 12/03/2015 1840   REPTSTATUS PENDING 12/03/2015 1840     Radiological Exams on Admission: Dg Chest 2 View  Result Date: 12/03/2015 CLINICAL DATA:  Chills with nausea and vomiting today EXAM: CHEST  2 VIEW COMPARISON:  10/15/2015 FINDINGS: Cardiac shadow is within normal limits. The lungs are well aerated bilaterally. No focal infiltrate or sizable effusion is seen. No acute bony abnormality is noted. IMPRESSION: No active cardiopulmonary disease. Electronically Signed   By: Alcide CleverMark  Lukens M.D.   On: 12/03/2015 19:28    Chart has been reviewed    Assessment/Plan   65 y.o. male with medical history significant of alcohol dependence without history of withdrawals, COPD  Being admitted for lactic acidosis most likely secondary to dehydration although sepsis could not be ruled out at this point  Present on Admission: . Sepsis (HCC) -Admit per Sepsis protocol   source  Unknown possible dehydration  - rehydrate with 8330ml/kg  - initiate broad spectrum antibiotics Vancomycin and Zosyn  -  obtain blood cultures  - Obtain serial lactic acid  - Obtain procalcitonin level  - Admit and monitor vital signs closely  -    Sepsis - Repeat Assessment  Performed at:    10:54 PM  Vitals     Blood pressure 146/77, pulse 97, temperature 98.7  F (37.1 C), temperature source Oral, resp. rate 17, height 5\' 8"  (1.727 m), weight 59.9 kg (132 lb), SpO2 95 %.  Heart:     Regular rate and rhythm  Lungs:    CTA  Capillary Refill:   <2 sec  Peripheral Pulse:   Radial pulse palpable  Skin:     Normal Color   . Alcohol abuse - CIWA protocol . Dehydration - administer IV fluids and recheck orthostatics . Diarrhea or gastric panel supportive measures . Intractable vomiting with nausea supportive measures and check U tox for marijuana presence given recurrent episodes . Lactic acidosis most likely secondary to dehydration although patient does meet sepsis criteria. Lactic acidosis rapidly improving . Transaminasemia this is persistent patient her history of alcohol use abuse . COPD (chronic obstructive pulmonary disease) (HCC) stable continue home medications . Hypoglycemia monitor blood sugars last hemoglobin A1c 1 months ago was 4.7 Hyperkalemia -monitor ontele   Other plan as per orders.  DVT prophylaxis:  SCD  Code Status:  FULL CODE per patient   Family Communication:   Family   at  Bedside  plan of care was discussed with  Girlfriend  Disposition Plan: To home once workup is complete and patient is stable  Consults called: none  Admission status:    obs   Level of care       tele     I have spent a total of 56 min on this admission   Rebekka Lobello 12/03/2015, 10:55 PM    [facility]  Pager [phone]   after 2 AM please page floor coverage PA If 7AM-7PM, please contact the day team taking care of the patient  [email or web address]  Password [email or web address]

## 2015-12-03 NOTE — ED Notes (Signed)
Pt nauseous but not actively vomiting at present time.

## 2015-12-03 NOTE — Progress Notes (Signed)
Pharmacy Antibiotic Follow-up Note  Micheal Carey is a 65 y.o. year-old male admitted on 12/03/2015.  The patient is currently on day 1 of Vancomycin & Zosyn for rule out sepsis.  Assessment/Plan: Vancomycin 1gm x1, then 750mg   IV every 12 hours.  Goal trough 15-20 mcg/mL. Zosyn 3.375g IV q8h (4 hour infusion).  Temp (24hrs), Avg:99.5 F (37.5 C), Min:98.2 F (36.8 C), Max:100.1 F (37.8 C)   Recent Labs Lab 12/03/15 1728  WBC 13.4*    Recent Labs Lab 12/03/15 1728  CREATININE 1.19   CrCl cannot be calculated (Unknown ideal weight.).    No Known Allergies  Antimicrobials this admission: 11/3 Vancomycin >>  11/3 Zosyn >>   Levels/dose changes this admission:  Microbiology results: None ordered at this time  Thank you for allowing pharmacy to be a part of this patient's care.  Otho BellowsGreen, Akanksha Bellmore L PharmD Pager [phone] 12/03/2015, 6:39 PM

## 2015-12-03 NOTE — ED Triage Notes (Signed)
Per EMS pt complaint of chills with n/v/d onset with awakening today. En route to hospital CBG 62, D10 given, PTA CBG 290.

## 2015-12-03 NOTE — ED Notes (Signed)
20 min timer started.  

## 2015-12-03 NOTE — Progress Notes (Signed)
EDCM spoke to patient at bedside.  Patient reports he is seen at the [facility] in [place].  He reports his pcp there is Dr. Katrinka BlazingSmith.  He reports he would like to go to the [facility] in [place].  No further EDCM needs at this time.

## 2015-12-03 NOTE — ED Provider Notes (Signed)
[facility]-EMERGENCY DEPT Provider Note   CSN: [id number] Arrival date & time: 12/03/15  1704     History   Chief Complaint Chief Complaint  Patient presents with  . Flu Like Symptoms    HPI Micheal Carey is a 65 y.o. male.  65 year old African-American male with past medical history significant for COPD, chronic low back pain presents to the ED today by ems with 1 day of nausea, vomiting, and diarrhea. Patient states he has had several episodes of emesis since last night along with several episodes of nonbloody watery diarrhea. He denies any hematemesis. Patient has not tried anything at home for his symptoms. Nothing makes better or worse. Patient states he has felt fine up until last night. Patient also endorses having subjective fevers at home this a.m but has not taken his temp. Patient is also a chronic alcohol abuser. He states he drinks approximately "6 beers occassionally". Patient states he has not had any alcohol today. States his last drink was yesterday afternoon. Patient states that he drinks "occasionally". He denies any other complaints, including lightheaded, dizziness, ha, vision changes,  chest pain, shortness breath, abdominal pain, hematemesis, melena, hematochezia, constipation, obstipation, cough, rhinorrhea, sore throat, URI symptoms, dysuria, hematuria, numbness, tingling, or focal weakness. He denies any change in food habits, sick contacts, or recent travel.   Patient was admitted on 9/14 through 9/16 for same complaints. He was worked up for sepsis and given IV abx. He was discharged with diagnoses of GI illness causing lactic acidosis secondary dehydration. Patient states he has been feeling fine since his hospitalization until yesterday.         Past Medical History:  Diagnosis Date  . CAP (community acquired pneumonia) 10/14/2015  . Pneumonia    "twice before" (10/14/2015)    Patient Active Problem List   Diagnosis Date Noted  . Intractable vomiting with  nausea 10/15/2015  . Transaminasemia 10/15/2015  . Diarrhea 10/15/2015  . Lactic acidosis 10/14/2015  . AKI (acute kidney injury) (HCC) 10/14/2015  . Alcohol abuse 10/14/2015  . COPD with acute exacerbation (HCC) 10/14/2015  . CAP (community acquired pneumonia) 10/14/2015  . Atypical lymphocytes present on peripheral blood smear   . Dehydration     Past Surgical History:  Procedure Laterality Date  . JOINT REPLACEMENT    . TOTAL HIP ARTHROPLASTY Right        Home Medications    Prior to Admission medications   Medication Sig Start Date End Date Taking? Authorizing Provider  albuterol (PROVENTIL HFA;VENTOLIN HFA) 108 (90 Base) MCG/ACT inhaler Inhale 2 puffs into the lungs every 6 (six) hours as needed for wheezing or shortness of breath.   Yes Historical Provider, MD  albuterol (PROVENTIL) (2.5 MG/3ML) 0.083% nebulizer solution Take 2.5 mg by nebulization every 6 (six) hours as needed for wheezing or shortness of breath.   Yes Historical Provider, MD  budesonide-formoterol (SYMBICORT) 160-4.5 MCG/ACT inhaler Inhale 2 puffs into the lungs 2 (two) times daily.   Yes Historical Provider, MD  Cholecalciferol (VITAMIN D PO) Take 1 tablet by mouth daily.   Yes Historical Provider, MD  Multiple Vitamins-Minerals (MULTIVITAMIN WITH MINERALS) tablet Take 1 tablet by mouth daily.   Yes Historical Provider, MD    Family History No family history on file.  Social History Social History  Substance Use Topics  . Smoking status: Never Smoker  . Smokeless tobacco: Never Used  . Alcohol use Yes     Comment: occasionally - patient reports however he drink 1 bottle  of wine on tuesday 10/14/15     Allergies   Review of patient's allergies indicates no known allergies.   Review of Systems Review of Systems  Constitutional: Positive for chills and fever.  HENT: Negative for congestion, ear pain, rhinorrhea and sore throat.   Eyes: Negative for pain and discharge.  Respiratory: Negative  for cough and shortness of breath.   Cardiovascular: Negative for chest pain, palpitations and leg swelling.  Gastrointestinal: Positive for diarrhea, nausea and vomiting. Negative for abdominal pain and blood in stool.  Genitourinary: Negative for flank pain, frequency, hematuria and urgency.  Musculoskeletal: Positive for back pain (chronic). Negative for myalgias and neck pain.  Skin: Negative.   Neurological: Negative for dizziness, syncope, weakness, light-headedness, numbness and headaches.  All other systems reviewed and are negative.    Physical Exam Updated Vital Signs BP 141/80 (BP Location: Right Arm)   Pulse 95   Temp 98.7 F (37.1 C) (Oral)   Resp 19   Ht 5\' 8"  (1.727 m)   Wt 59.9 kg   SpO2 99%   BMI 20.07 kg/m   Physical Exam  Constitutional: He is oriented to person, place, and time. He appears well-developed and well-nourished. He appears ill.  Actively vomiting in room and looks uncomfortable.  HENT:  Head: Normocephalic and atraumatic.  Nose: Nose normal.  Mouth/Throat: Uvula is midline and oropharynx is clear and moist. Mucous membranes are not pale and dry.  Eyes: Conjunctivae are normal. Right eye exhibits no discharge. Left eye exhibits no discharge. No scleral icterus.  Neck: Normal range of motion. Neck supple. No thyromegaly present.  Cardiovascular: Regular rhythm, normal heart sounds and intact distal pulses.  Tachycardia present.  Exam reveals no gallop and no friction rub.   No murmur heard. Pulmonary/Chest: Effort normal and breath sounds normal. No respiratory distress.  Abdominal: Soft. Bowel sounds are normal. He exhibits no distension. There is no tenderness. There is no rebound and no guarding.  Musculoskeletal: Normal range of motion.  Lymphadenopathy:    He has no cervical adenopathy.  Neurological: He is alert and oriented to person, place, and time. GCS eye subscore is 4. GCS verbal subscore is 5. GCS motor subscore is 6.  Skin: Skin is  warm and dry. Capillary refill takes less than 2 seconds.  Vitals reviewed.    ED Treatments / Results  Labs (all labs ordered are listed, but only abnormal results are displayed) Labs Reviewed  COMPREHENSIVE METABOLIC PANEL - Abnormal; Notable for the following:       Result Value   Potassium 5.2 (*)    CO2 13 (*)    Glucose, Bld 254 (*)    BUN 21 (*)    AST 280 (*)    Total Bilirubin 1.3 (*)    Anion gap 24 (*)    All other components within normal limits  CBC - Abnormal; Notable for the following:    WBC 13.4 (*)    All other components within normal limits  I-STAT CG4 LACTIC ACID, ED - Abnormal; Notable for the following:    Lactic Acid, Venous 7.49 (*)    All other components within normal limits  I-STAT CHEM 8, ED - Abnormal; Notable for the following:    BUN 28 (*)    Glucose, Bld 188 (*)    Calcium, Ion 1.06 (*)    All other components within normal limits  CULTURE, BLOOD (ROUTINE X 2)  CULTURE, BLOOD (ROUTINE X 2)  URINE CULTURE  LIPASE, BLOOD  URINALYSIS, ROUTINE W REFLEX MICROSCOPIC (NOT AT [facility])    EKG  EKG Interpretation  Date/Time:  Friday December 03 2015 18:39:23 EDT Ventricular Rate:  112 PR Interval:    QRS Duration: 123 QT Interval:  344 QTC Calculation: 470 R Axis:   107 Text Interpretation:  Sinus tachycardia RBBB and LPFB Confirmed by Denton Lank  MD, Caryn Bee ([id number]) on 12/03/2015 7:19:40 PM       Radiology Dg Chest 2 View  Result Date: 12/03/2015 CLINICAL DATA:  Chills with nausea and vomiting today EXAM: CHEST  2 VIEW COMPARISON:  10/15/2015 FINDINGS: Cardiac shadow is within normal limits. The lungs are well aerated bilaterally. No focal infiltrate or sizable effusion is seen. No acute bony abnormality is noted. IMPRESSION: No active cardiopulmonary disease. Electronically Signed   By: Alcide Clever M.D.   On: 12/03/2015 19:28    Procedures Procedures (including critical care time)  Medications Ordered in ED Medications  vancomycin  (VANCOCIN) IVPB 1000 mg/200 mL premix (1,000 mg Intravenous New Bag/Given 12/03/15 2007)  piperacillin-tazobactam (ZOSYN) IVPB 3.375 g (not administered)  vancomycin (VANCOCIN) IVPB 750 mg/150 ml premix (not administered)  morphine 2 MG/ML injection 4 mg (not administered)  ondansetron (ZOFRAN) injection 4 mg (4 mg Intravenous Given 12/03/15 1735)  ondansetron (ZOFRAN) injection 4 mg (4 mg Intravenous Given 12/03/15 1843)  sodium chloride 0.9 % bolus 1,000 mL (0 mLs Intravenous Stopped 12/03/15 1955)    And  sodium chloride 0.9 % bolus 500 mL (500 mLs Intravenous New Bag/Given 12/03/15 2007)    And  sodium chloride 0.9 % bolus 250 mL (250 mLs Intravenous New Bag/Given 12/03/15 2008)  piperacillin-tazobactam (ZOSYN) IVPB 3.375 g (0 g Intravenous Stopped 12/03/15 1955)  acetaminophen (TYLENOL) tablet 650 mg (650 mg Oral Given 12/03/15 1910)     Initial Impression / Assessment and Plan / ED Course  I have reviewed the triage vital signs and the nursing notes.  Pertinent labs & imaging results that were available during my care of the patient were reviewed by me and considered in my medical decision making (see chart for details).  Clinical Course  Patient presents to the ED with nausea, vomiting, diarrhea. On exam patient was tachycardic  In the 130s. He was not febrile at that time. White count was noted to be 13. Lactic acid was ordered and was noted to be 7. Sepsis protocol initiated.30 mL per KG fluid bolus was ordered. Broad-spectrum antibiotics were initiated given unknown source. Chest x-ray was unremarkable. UA was unremarkable. Blood cultures were obtained prior to antibiotics started. This is likely a gastrointestinal infection causing lactic acidosis secondary to dehydration. History of same with admission for rehydration. Patient was noted to be mildly hypoglycemic on EMS arrival. He was given D50 with improvement. The patient's emesis was treated in ED with significant improvement. Patient  only complaint was back pain.Patient is an alcoholic but is in no signs of withdrawal this time.Repeat lactic was 2. EKG showed a right bundle branch block that was not seen on prior EKGs. Troponin was negative. Patient denies any chest pain or shortness of breath. I consulted with hospital medicine who agrees to admit the patient. The patient was discussed and evaluated by Dr. Ledell Noss who agrees for admission. The patient is agreeable to the plan.Patient is hemodynamically stable this time and in no acute distress.  CRITICAL CARE Performed by: Demetrios Loll   Total critical care time: 30 minutes  Critical care time was exclusive of separately billable procedures and treating other patients.  Critical care was necessary to treat or prevent imminent or life-threatening deterioration.  Critical care was time spent personally by me on the following activities: development of treatment plan with patient and/or surrogate as well as nursing, discussions with consultants, evaluation of patient's response to treatment, examination of patient, obtaining history from patient or surrogate, ordering and performing treatments and interventions, ordering and review of laboratory studies, ordering and review of radiographic studies, pulse oximetry and re-evaluation of patient's condition.  Final Clinical Impressions(s) / ED Diagnoses   Final diagnoses:  Sepsis, due to unspecified organism (HCC)  Nausea and vomiting, intractability of vomiting not specified, unspecified vomiting type  Diarrhea, unspecified type    New Prescriptions New Prescriptions   No medications on file     Rise Mu, PA-C 12/05/15 1155    Cathren Laine, MD 12/05/15 [phone]

## 2015-12-04 DIAGNOSIS — R740 Nonspecific elevation of levels of transaminase and lactic acid dehydrogenase [LDH]: Secondary | ICD-10-CM | POA: Diagnosis present

## 2015-12-04 DIAGNOSIS — R112 Nausea with vomiting, unspecified: Secondary | ICD-10-CM | POA: Diagnosis present

## 2015-12-04 DIAGNOSIS — E872 Acidosis: Secondary | ICD-10-CM | POA: Diagnosis present

## 2015-12-04 DIAGNOSIS — E860 Dehydration: Secondary | ICD-10-CM | POA: Diagnosis present

## 2015-12-04 DIAGNOSIS — K529 Noninfective gastroenteritis and colitis, unspecified: Secondary | ICD-10-CM | POA: Diagnosis present

## 2015-12-04 DIAGNOSIS — E162 Hypoglycemia, unspecified: Secondary | ICD-10-CM | POA: Diagnosis present

## 2015-12-04 DIAGNOSIS — R197 Diarrhea, unspecified: Secondary | ICD-10-CM | POA: Diagnosis not present

## 2015-12-04 DIAGNOSIS — Z681 Body mass index (BMI) 19 or less, adult: Secondary | ICD-10-CM | POA: Diagnosis not present

## 2015-12-04 DIAGNOSIS — E875 Hyperkalemia: Secondary | ICD-10-CM | POA: Diagnosis present

## 2015-12-04 DIAGNOSIS — J410 Simple chronic bronchitis: Secondary | ICD-10-CM | POA: Diagnosis not present

## 2015-12-04 DIAGNOSIS — F1010 Alcohol abuse, uncomplicated: Secondary | ICD-10-CM | POA: Diagnosis present

## 2015-12-04 DIAGNOSIS — E43 Unspecified severe protein-calorie malnutrition: Secondary | ICD-10-CM | POA: Diagnosis present

## 2015-12-04 DIAGNOSIS — J449 Chronic obstructive pulmonary disease, unspecified: Secondary | ICD-10-CM | POA: Diagnosis present

## 2015-12-04 DIAGNOSIS — E876 Hypokalemia: Secondary | ICD-10-CM | POA: Diagnosis not present

## 2015-12-04 LAB — CBC WITH DIFFERENTIAL/PLATELET
BASOS PCT: 0 %
Basophils Absolute: 0 10*3/uL (ref 0.0–0.1)
EOS ABS: 0 10*3/uL (ref 0.0–0.7)
Eosinophils Relative: 0 %
HCT: 31.1 % — ABNORMAL LOW (ref 39.0–52.0)
Hemoglobin: 10.8 g/dL — ABNORMAL LOW (ref 13.0–17.0)
Lymphocytes Relative: 4 %
Lymphs Abs: 0.3 10*3/uL — ABNORMAL LOW (ref 0.7–4.0)
MCH: 29.8 pg (ref 26.0–34.0)
MCHC: 34.7 g/dL (ref 30.0–36.0)
MCV: 85.7 fL (ref 78.0–100.0)
MONO ABS: 1.4 10*3/uL — AB (ref 0.1–1.0)
MONOS PCT: 15 %
NEUTROS PCT: 81 %
Neutro Abs: 7.4 10*3/uL (ref 1.7–7.7)
Platelets: 168 10*3/uL (ref 150–400)
RBC: 3.63 MIL/uL — ABNORMAL LOW (ref 4.22–5.81)
RDW: 13.6 % (ref 11.5–15.5)
WBC: 9.1 10*3/uL (ref 4.0–10.5)

## 2015-12-04 LAB — CBC
HEMATOCRIT: 31.5 % — AB (ref 39.0–52.0)
HEMOGLOBIN: 10.9 g/dL — AB (ref 13.0–17.0)
MCH: 29.6 pg (ref 26.0–34.0)
MCHC: 34.6 g/dL (ref 30.0–36.0)
MCV: 85.6 fL (ref 78.0–100.0)
Platelets: 166 10*3/uL (ref 150–400)
RBC: 3.68 MIL/uL — ABNORMAL LOW (ref 4.22–5.81)
RDW: 13.7 % (ref 11.5–15.5)
WBC: 9.2 10*3/uL (ref 4.0–10.5)

## 2015-12-04 LAB — GASTROINTESTINAL PANEL BY PCR, STOOL (REPLACES STOOL CULTURE)

## 2015-12-04 LAB — C DIFFICILE QUICK SCREEN W PCR REFLEX
C DIFFICLE (CDIFF) ANTIGEN: NEGATIVE
C Diff interpretation: NOT DETECTED
C Diff toxin: NEGATIVE

## 2015-12-04 LAB — BASIC METABOLIC PANEL
Anion gap: 12 (ref 5–15)
BUN: 17 mg/dL (ref 6–20)
CALCIUM: 8 mg/dL — AB (ref 8.9–10.3)
CO2: 19 mmol/L — AB (ref 22–32)
CREATININE: 1.03 mg/dL (ref 0.61–1.24)
Chloride: 109 mmol/L (ref 101–111)
GFR calc non Af Amer: >60 mL/min (ref >60)
Glucose, Bld: 89 mg/dL (ref 65–99)
Potassium: 3.8 mmol/L (ref 3.5–5.1)
Sodium: 140 mmol/L (ref 135–145)

## 2015-12-04 LAB — COMPREHENSIVE METABOLIC PANEL
ALBUMIN: 3.3 g/dL — AB (ref 3.5–5.0)
ALT: 41 U/L (ref 17–63)
ANION GAP: 10 (ref 5–15)
AST: 140 U/L — ABNORMAL HIGH (ref 15–41)
Alkaline Phosphatase: 62 U/L (ref 38–126)
BILIRUBIN TOTAL: 1.8 mg/dL — AB (ref 0.3–1.2)
BUN: 18 mg/dL (ref 6–20)
CO2: 19 mmol/L — ABNORMAL LOW (ref 22–32)
Calcium: 7.5 mg/dL — ABNORMAL LOW (ref 8.9–10.3)
Chloride: 111 mmol/L (ref 101–111)
Creatinine, Ser: 0.99 mg/dL (ref 0.61–1.24)
GFR calc Af Amer: >60 mL/min (ref >60)
GLUCOSE: 93 mg/dL (ref 65–99)
POTASSIUM: 4 mmol/L (ref 3.5–5.1)
Sodium: 140 mmol/L (ref 135–145)
TOTAL PROTEIN: 6 g/dL — AB (ref 6.5–8.1)

## 2015-12-04 LAB — INFLUENZA PANEL BY PCR (TYPE A & B)
INFLBPCR: NEGATIVE
Influenza A By PCR: NEGATIVE

## 2015-12-04 LAB — GLUCOSE, CAPILLARY
GLUCOSE-CAPILLARY: 87 mg/dL (ref 65–99)
GLUCOSE-CAPILLARY: 80 mg/dL (ref 65–99)
GLUCOSE-CAPILLARY: 112 mg/dL — AB (ref 65–99)
GLUCOSE-CAPILLARY: 106 mg/dL — AB (ref 65–99)
Glucose-Capillary: 150 mg/dL — ABNORMAL HIGH (ref 65–99)

## 2015-12-04 LAB — LACTIC ACID, PLASMA
LACTIC ACID, VENOUS: 0.9 mmol/L (ref 0.5–1.9)
LACTIC ACID, VENOUS: 1.3 mmol/L (ref 0.5–1.9)

## 2015-12-04 LAB — PHOSPHORUS: PHOSPHORUS: 2.8 mg/dL (ref 2.5–4.6)

## 2015-12-04 LAB — MAGNESIUM: MAGNESIUM: 1.6 mg/dL — AB (ref 1.7–2.4)

## 2015-12-04 LAB — TSH: TSH: 0.194 u[IU]/mL — AB (ref 0.350–4.500)

## 2015-12-04 MED ORDER — ONDANSETRON HCL 4 MG/2ML IJ SOLN
4.0000 mg | Freq: Four times a day (QID) | INTRAMUSCULAR | Status: DC | PRN
Start: 2015-12-04 — End: 2015-12-05

## 2015-12-04 MED ORDER — GABAPENTIN 100 MG PO CAPS
100.0000 mg | ORAL_CAPSULE | Freq: Every day | ORAL | Status: DC
  Administered 2015-12-04 (×2): 100 mg via ORAL
  Filled 2015-12-04 (×2): qty 1

## 2015-12-04 MED ORDER — ALBUTEROL SULFATE (2.5 MG/3ML) 0.083% IN NEBU: 2.5000 mg | INHALATION_SOLUTION | Freq: Four times a day (QID) | RESPIRATORY_TRACT | Status: DC | PRN

## 2015-12-04 MED ORDER — SODIUM CHLORIDE 0.9 % IV SOLN
INTRAVENOUS | Status: DC
  Administered 2015-12-04: 01:00:00 via INTRAVENOUS

## 2015-12-04 MED ORDER — MOMETASONE FURO-FORMOTEROL FUM 200-5 MCG/ACT IN AERO
2.0000 | INHALATION_SPRAY | Freq: Two times a day (BID) | RESPIRATORY_TRACT | Status: DC
Start: 2015-12-04 — End: 2015-12-05
  Administered 2015-12-04 – 2015-12-05 (×3): 2 via RESPIRATORY_TRACT
  Filled 2015-12-04: qty 8.8

## 2015-12-04 MED ORDER — MAGNESIUM SULFATE 2 GM/50ML IV SOLN
2.0000 g | Freq: Once | INTRAVENOUS | Status: AC
  Administered 2015-12-04: 2 g via INTRAVENOUS
  Filled 2015-12-04: qty 50

## 2015-12-04 MED ORDER — ENOXAPARIN SODIUM 40 MG/0.4ML ~~LOC~~ SOLN
40.0000 mg | SUBCUTANEOUS | Status: DC
  Administered 2015-12-04: 40 mg via SUBCUTANEOUS
  Filled 2015-12-04 (×2): qty 0.4

## 2015-12-04 MED ORDER — MAGNESIUM SULFATE BOLUS VIA INFUSION: 2.0000 g | Freq: Once | INTRAVENOUS | Status: DC

## 2015-12-04 MED ORDER — SODIUM CHLORIDE 0.9% FLUSH
3.0000 mL | Freq: Two times a day (BID) | INTRAVENOUS | Status: DC
  Administered 2015-12-04 – 2015-12-05 (×3): 3 mL via INTRAVENOUS

## 2015-12-04 MED ORDER — ACETAMINOPHEN 325 MG PO TABS: 650.0000 mg | ORAL_TABLET | Freq: Four times a day (QID) | ORAL | Status: DC | PRN

## 2015-12-04 MED ORDER — ONDANSETRON HCL 4 MG PO TABS: 4.0000 mg | ORAL_TABLET | Freq: Four times a day (QID) | ORAL | Status: DC | PRN

## 2015-12-04 MED ORDER — TRAMADOL HCL 50 MG PO TABS
50.0000 mg | ORAL_TABLET | Freq: Four times a day (QID) | ORAL | Status: DC | PRN
  Administered 2015-12-04: 50 mg via ORAL
  Filled 2015-12-04: qty 1

## 2015-12-04 MED ORDER — CHLORHEXIDINE GLUCONATE 0.12 % MT SOLN
15.0000 mL | Freq: Two times a day (BID) | OROMUCOSAL | Status: DC
  Administered 2015-12-04 – 2015-12-05 (×3): 15 mL via OROMUCOSAL
  Filled 2015-12-04 (×3): qty 15

## 2015-12-04 MED ORDER — ORAL CARE MOUTH RINSE
15.0000 mL | Freq: Two times a day (BID) | OROMUCOSAL | Status: DC
  Administered 2015-12-04: 15 mL via OROMUCOSAL

## 2015-12-04 MED ORDER — LORAZEPAM 2 MG/ML IJ SOLN
1.0000 mg | Freq: Once | INTRAMUSCULAR | Status: AC
  Administered 2015-12-04: 1 mg via INTRAVENOUS
  Filled 2015-12-04: qty 1

## 2015-12-04 MED ORDER — ACETAMINOPHEN 650 MG RE SUPP: 650.0000 mg | Freq: Four times a day (QID) | RECTAL | Status: DC | PRN

## 2015-12-04 NOTE — Progress Notes (Signed)
[facility]  PROGRESS NOTE  Micheal Carey VHQ:[id number] DOB: 1950-12-17 DOA: 12/03/2015 PCP: No primary care provider on file.   Brief HPI:   * 65 y.o. male with medical history significant of alcohol dependence without history of withdrawals, COPD , came to hospital with complaints of nausea vomiting and diarrhea. Also described flulike symptoms.     Subjective   This morning patient feels better, denies nausea and vomiting.   Assessment/Plan:     1. Alcohol abuse- no signs and symptoms of alcohol withdrawal, continue CIWA protocol. 2. Gastroenteritis- resolved, we'll start clear liquid diet and advance as tolerated. 3. Mild hyperchloremic metabolic acidosis- bicarbonate 19, chloride 109. Likely from diarrhea. Continue IV fluids, will follow BMP in a.m. 4. Transaminitis-AST 140, ALT 41, total bili 1.8. Secondary to alcohol abuse. Follow labs in a.m. 5. COPD- stable, no exacerbation.      DVT prophylaxis: *Lovenox  Code Status: Full code  Family Communication: Discussed with patient's wife at bedside   Disposition Plan: home when medically stable   Consultants:  None  Procedures:  None  Antibiotics:   Anti-infectives    Start     Dose/Rate Route Frequency Ordered Stop   12/04/15 0800  vancomycin (VANCOCIN) IVPB 750 mg/150 ml premix     750 mg 150 mL/hr over 60 Minutes Intravenous Every 12 hours 12/03/15 1845     12/04/15 0400  piperacillin-tazobactam (ZOSYN) IVPB 3.375 g     3.375 g 12.5 mL/hr over 240 Minutes Intravenous Every 8 hours 12/03/15 1844     12/03/15 1845  piperacillin-tazobactam (ZOSYN) IVPB 3.375 g     3.375 g 100 mL/hr over 30 Minutes Intravenous  Once 12/03/15 1836 12/03/15 1955   12/03/15 1845  vancomycin (VANCOCIN) IVPB 1000 mg/200 mL premix     1,000 mg 200 mL/hr over 60 Minutes Intravenous  Once 12/03/15 1836 12/03/15 2214       Objective   Vitals:   12/04/15 0008 12/04/15 0023 12/04/15 0534 12/04/15 0623  BP:  (!) 147/66  140/69   Pulse:  89 78   Resp:  18 18   Temp:  98.4 F (36.9 C) 98.2 F (36.8 C)   TempSrc:  Oral Oral   SpO2: 98% 98% 100% 98%  Weight:  53.5 kg (118 lb)    Height:  5\' 8"  (1.727 m)      Intake/Output Summary (Last 24 hours) at 12/04/15 1409 Last data filed at 12/04/15 1100  Gross per 24 hour  Intake          1602.08 ml  Output                0 ml  Net          1602.08 ml   Filed Weights   12/03/15 1855 12/04/15 0023  Weight: 59.9 kg (132 lb) 53.5 kg (118 lb)     Physical Examination:  General exam: Appears calm and comfortable. Respiratory system: Clear to auscultation. Respiratory effort normal. Cardiovascular system:  RRR. No  murmurs, rubs, gallops. No pedal edema. GI system: Abdomen is nondistended, soft and nontender. No organomegaly.  Central nervous system. No focal neurological deficits. 5 x 5 power in all extremities. Skin: No rashes, lesions or ulcers. Psychiatry: Alert, oriented x 3.Judgement and insight appear normal. Affect normal.    Data Reviewed: I have personally reviewed following labs and imaging studies  CBG:  Recent Labs Lab 12/04/15 0357 12/04/15 0752 12/04/15 1141  GLUCAP 87 80 150*    CBC:  Recent Labs Lab 12/03/15 1728 12/03/15 1843 12/03/15 2352 12/04/15 0233  WBC 13.4*  --  9.1 9.2  NEUTROABS  --   --  7.4  --   HGB 14.1 15.0 10.8* 10.9*  HCT 41.9 44.0 31.1* 31.5*  MCV 88.8  --  85.7 85.6  PLT 233  --  168 166    Basic Metabolic Panel:  Recent Labs Lab 12/03/15 1728 12/03/15 1843 12/04/15 0233 12/04/15 0828  NA 140 138 140 140  K 5.2* 5.0 4.0 3.8  CL 103 108 111 109  CO2 13*  --  19* 19*  GLUCOSE 254* 188* 93 89  BUN 21* 28* 18 17  CREATININE 1.19 1.00 0.99 1.03  CALCIUM 9.4  --  7.5* 8.0*  MG  --   --  1.6*  --   PHOS  --   --  2.8  --     Recent Results (from the past 240 hour(s))  Blood Culture (routine x 2)     Status: None (Preliminary result)   Collection Time: 12/03/15  6:35 PM  Result Value  Ref Range Status   Specimen Description BLOOD RIGHT HAND  Final   Special Requests BOTTLES DRAWN AEROBIC AND ANAEROBIC 5CC  Final   Culture   Final    NO GROWTH < 24 HOURS [facility]    Report Status PENDING  Incomplete  Blood Culture (routine x 2)     Status: None (Preliminary result)   Collection Time: 12/03/15  6:40 PM  Result Value Ref Range Status   Specimen Description BLOOD RIGHT HAND  Final   Special Requests BOTTLES DRAWN AEROBIC ONLY 2CC  Final   Culture   Final    NO GROWTH < 24 HOURS [facility]formed at [facility] [place] [facility]    Report Status PENDING  Incomplete  C difficile quick scan w PCR reflex     Status: None   Collection Time: 12/04/15  9:47 AM  Result Value Ref Range Status   C Diff antigen NEGATIVE NEGATIVE Final   C Diff toxin NEGATIVE NEGATIVE Final   C Diff interpretation No C. difficile detected.  Final     Liver Function Tests:  Recent Labs Lab 12/03/15 1728 12/04/15 0233  AST 280* 140*  ALT 63 41  ALKPHOS 90 62  BILITOT 1.3* 1.8*  PROT 7.9 6.0*  ALBUMIN 4.6 3.3*    Recent Labs Lab 12/03/15 1728  LIPASE 22   No results for input(s): AMMONIA in the last 168 hours.  Cardiac Enzymes:  Recent Labs Lab 12/03/15 2105  TROPONINI <0.03   BNP (last 3 results) No results for input(s): BNP in the last 8760 hours.  ProBNP (last 3 results) No results for input(s): PROBNP in the last 8760 hours.    Studies: Dg Chest 2 View  Result Date: 12/03/2015 CLINICAL DATA:  Chills with nausea and vomiting today EXAM: CHEST  2 VIEW COMPARISON:  10/15/2015 FINDINGS: Cardiac shadow is within normal limits. The lungs are well aerated bilaterally. No focal infiltrate or sizable effusion is seen. No acute bony abnormality is noted. IMPRESSION: No active cardiopulmonary disease. Electronically Signed   By: Alcide CleverMark  Lukens M.D.   On: 12/03/2015 19:28    Scheduled Meds: . chlorhexidine  15 mL Mouth Rinse BID  . enoxaparin (LOVENOX) injection  40  mg Subcutaneous Q24H  . folic acid  1 mg Oral Daily  . gabapentin  100 mg Oral QHS  . mouth rinse  15 mL Mouth Rinse  q12n4p  . mometasone-formoterol  2 puff Inhalation BID  . multivitamin with minerals  1 tablet Oral Daily  . piperacillin-tazobactam (ZOSYN)  IV  3.375 g Intravenous Q8H  . sodium chloride flush  3 mL Intravenous Q12H  . thiamine  100 mg Oral Daily  . vancomycin  750 mg Intravenous Q12H    Continuous Infusions:   Time spent: 25 min  [facility],Geremy Rister S   [facility] Pager [phone]. If 7PM-7AM, please contact night-coverage at [email or web address], Office  [phone]  password [email or web address] 12/04/2015, 2:09 PM  LOS: 0 days

## 2015-12-04 NOTE — ED Notes (Signed)
Total of 2750 ns in  antibiotics zosyn and vanco in  650 tylenol po  4 mg morphine in  8mg  zofran in

## 2015-12-05 LAB — CBC
HEMATOCRIT: 35.7 % — AB (ref 39.0–52.0)
Hemoglobin: 12.5 g/dL — ABNORMAL LOW (ref 13.0–17.0)
MCH: 30 pg (ref 26.0–34.0)
MCHC: 35 g/dL (ref 30.0–36.0)
MCV: 85.8 fL (ref 78.0–100.0)
Platelets: 175 10*3/uL (ref 150–400)
RBC: 4.16 MIL/uL — AB (ref 4.22–5.81)
RDW: 13.5 % (ref 11.5–15.5)
WBC: 5.9 10*3/uL (ref 4.0–10.5)

## 2015-12-05 LAB — COMPREHENSIVE METABOLIC PANEL
ALBUMIN: 3.5 g/dL (ref 3.5–5.0)
ALT: 35 U/L (ref 17–63)
AST: 90 U/L — AB (ref 15–41)
Alkaline Phosphatase: 59 U/L (ref 38–126)
Anion gap: 11 (ref 5–15)
BILIRUBIN TOTAL: 2.1 mg/dL — AB (ref 0.3–1.2)
BUN: 9 mg/dL (ref 6–20)
CHLORIDE: 100 mmol/L — AB (ref 101–111)
CO2: 24 mmol/L (ref 22–32)
CREATININE: 0.76 mg/dL (ref 0.61–1.24)
Calcium: 8.1 mg/dL — ABNORMAL LOW (ref 8.9–10.3)
GFR calc Af Amer: >60 mL/min (ref >60)
GFR calc non Af Amer: >60 mL/min (ref >60)
GLUCOSE: 91 mg/dL (ref 65–99)
POTASSIUM: 3 mmol/L — AB (ref 3.5–5.1)
Sodium: 135 mmol/L (ref 135–145)
TOTAL PROTEIN: 6.2 g/dL — AB (ref 6.5–8.1)

## 2015-12-05 LAB — BASIC METABOLIC PANEL
Anion gap: 11 (ref 5–15)
BUN: 8 mg/dL (ref 6–20)
CHLORIDE: 99 mmol/L — AB (ref 101–111)
CO2: 23 mmol/L (ref 22–32)
Calcium: 8.6 mg/dL — ABNORMAL LOW (ref 8.9–10.3)
Creatinine, Ser: 0.85 mg/dL (ref 0.61–1.24)
GFR calc non Af Amer: >60 mL/min (ref >60)
Glucose, Bld: 96 mg/dL (ref 65–99)
POTASSIUM: 3.5 mmol/L (ref 3.5–5.1)
SODIUM: 133 mmol/L — AB (ref 135–145)

## 2015-12-05 LAB — GLUCOSE, CAPILLARY
GLUCOSE-CAPILLARY: 91 mg/dL (ref 65–99)
Glucose-Capillary: 97 mg/dL (ref 65–99)
Glucose-Capillary: 104 mg/dL — ABNORMAL HIGH (ref 65–99)

## 2015-12-05 LAB — URINE CULTURE: Culture: <10000 — AB

## 2015-12-05 LAB — PROCALCITONIN: PROCALCITONIN: 0.2 ng/mL

## 2015-12-05 MED ORDER — ENSURE ENLIVE PO LIQD: 237.0000 mL | Freq: Two times a day (BID) | ORAL | Status: DC

## 2015-12-05 MED ORDER — POTASSIUM CHLORIDE CRYS ER 20 MEQ PO TBCR
40.0000 meq | EXTENDED_RELEASE_TABLET | ORAL | Status: AC
  Administered 2015-12-05 (×2): 40 meq via ORAL
  Filled 2015-12-05 (×2): qty 2

## 2015-12-05 NOTE — Progress Notes (Signed)
Patient stated that he was not waiting on MD to discharge him and he would be leaving AMA. I notified Dr. Sharl MaLama and informed the patient that the MD would be here momentarily. He refused to wait and signed AMA form. Patient removed telemetry and removed both of his IV's. Form placed in chart.

## 2015-12-05 NOTE — Progress Notes (Signed)
Initial Nutrition Assessment  DOCUMENTATION CODES:   Severe malnutrition in context of acute illness/injury, Underweight  INTERVENTION:   Provide Ensure Enlive po BID, each supplement provides 350 kcal and 20 grams of protein Encourage PO intake, emphasizing protein with meals RD to continue to monitor  NUTRITION DIAGNOSIS:   Increased nutrient needs related to other (see comment) (ETOH abuse) as evidenced by estimated needs.  GOAL:   Patient will meet greater than or equal to 90% of their needs  MONITOR:   PO intake, Supplement acceptance, Labs, Weight trends, I & O's  REASON FOR ASSESSMENT:   Malnutrition Screening Tool    ASSESSMENT:   65 y.o. male with medical history significant of alcohol dependence without history of withdrawals, COPD , came to hospital with complaints of nausea vomiting and diarrhea. Also described flulike symptoms.   Patient in room with wife at bedside. Pt reports "fair appetite" which was the same when he was home. He states at home he would eat 2 meals a day and reports he always eats protein with his meals. Reviewed protein food options. Pt ate 100% of his lunch meal today. States he is interested in getting some Ensure supplements, strawberry flavor. RD to order.  Pt reports UBW of 150 lb but most recently he weighed 132 lb a month ago. He has since lost 14 lb (11% wt loss x 1 month, significant for time frame). Nutrition-Focused physical exam completed. Findings are moderate fat depletion, moderate muscle depletion, and no edema.   Medications: Folic acid tablet daily, Multivitamin with minerals daily, Thiamine tablet daily Labs reviewed: CBGs: 97-104 Low K  Diet Order:  Diet regular Room service appropriate? Yes; Fluid consistency: Thin Diet - low sodium heart healthy  Skin:  Reviewed, no issues  Last BM:  11/4  Height:   Ht Readings from Last 1 Encounters:  12/04/15 5\' 8"  (1.727 m)    Weight:   Wt Readings from Last 1  Encounters:  12/04/15 118 lb (53.5 kg)    Ideal Body Weight:  70 kg  BMI:  Body mass index is 17.94 kg/m.  Estimated Nutritional Needs:   Kcal:  1600-1800  Protein:  80-90g  Fluid:  1.8L/day  EDUCATION NEEDS:   Education needs addressed  Tilda FrancoLindsey Kayli Beal, MS, RD, LDN Pager: [phone] After Hours Pager: [phone]

## 2015-12-05 NOTE — Discharge Summary (Signed)
Physician Discharge Summary  Micheal Carey ZOX:[id number] DOB: 1950/08/03 DOA: 12/03/2015  PCP: No primary care provider on file.  Admit date: 12/03/2015 Discharge date: 12/05/2015  Time spent: 25* minutes  Recommendations for Outpatient Follow-up:  1. Patient left AMA   Discharge Diagnoses:  Active Problems:   Lactic acidosis   Alcohol abuse   Dehydration   Intractable vomiting with nausea   Transaminasemia   Diarrhea   COPD (chronic obstructive pulmonary disease) (HCC)   Hypoglycemia   Sepsis (HCC)    Filed Weights   12/03/15 1855 12/04/15 0023  Weight: 59.9 kg (132 lb) 53.5 kg (118 lb)    History of present illness:  65 y.o. male with medical history significant of alcohol dependence without history of withdrawals, COPD     Presented with a few days of nausea vomiting and diarrhea his been feeling lightheaded  Patient 3 weeks couple bottles of wine every other week he have had some alcohol tonight. Patient endorses chills although no fever. EMS was called to his house was noted that his CBG was down to 60 to a given D10 and CBG improved up to 290. Patient describes flulike symptoms diarrhea has been nonbloody and watery no blood present in emesis he has not attempted to use any medications for his symptoms this was very sudden in onset he never measured his temperature but he feels that maybe he had subjective fever and denies any recent travel besides in September he went to [facility]. denies any chest pain, no shortness of breath.   Hospital Course:  1. Alcohol abuse- no signs and symptoms of alcohol withdrawal, patient was started on CIWA protocol in the hospital. 2. Gastroenteritis- resolved, tolerated diet well 3. Mild hyperchloremic metabolic acidosis-  Resolved, [id number], chloride 99. Likely from diarrhea. 4. Transaminitis-AST 140, ALT 41, total bili 1.8. Secondary to alcohol abuse.  5. COPD- stable, no exacerbation 6. Hypokalemia- potassium this morning was  3.0, was given two doses of Kdur 40 meq po q 4 hr x 2 doses. Repeat labs were drawn at 2 pm, patient did not want to wait for him to be discharged. Was told  By RN that I will be coming to discharge him in 5  Minutes.Left AMA at 3 pm. By the way repeat BMP showed potassium 3.5.  Discharge Exam: Vitals:   12/05/15 0454 12/05/15 1130  BP: 126/70 119/74  Pulse: 79 63  Resp: 16 16  Temp: 98.2 F (36.8 C) 98.3 F (36.8 C)      Discharge Instructions   Discharge Instructions    Diet - low sodium heart healthy    Complete by:  As directed    Increase activity slowly    Complete by:  As directed       No Known Allergies    The results of significant diagnostics from this hospitalization (including imaging, microbiology, ancillary and laboratory) are listed below for reference.    Significant Diagnostic Studies: Dg Chest 2 View  Result Date: 12/03/2015 CLINICAL DATA:  Chills with nausea and vomiting today EXAM: CHEST  2 VIEW COMPARISON:  10/15/2015 FINDINGS: Cardiac shadow is within normal limits. The lungs are well aerated bilaterally. No focal infiltrate or sizable effusion is seen. No acute bony abnormality is noted. IMPRESSION: No active cardiopulmonary disease. Electronically Signed   By: Alcide Clever M.D.   On: 12/03/2015 19:28    Microbiology: Recent Results (from the past 240 hour(s))  Blood Culture (routine x 2)     Status: None (Preliminary  result)   Collection Time: 12/03/15  6:35 PM  Result Value Ref Range Status   Specimen Description BLOOD RIGHT HAND  Final   Special Requests BOTTLES DRAWN AEROBIC AND ANAEROBIC 5CC  Final   Culture   Final    NO GROWTH 2 DAYS [facility]    Report Status PENDING  Incomplete  Blood Culture (routine x 2)     Status: None (Preliminary result)   Collection Time: 12/03/15  6:40 PM  Result Value Ref Range Status   Specimen Description BLOOD RIGHT HAND  Final   Special Requests BOTTLES DRAWN AEROBIC ONLY 2CC   Final   Culture   Final    NO GROWTH 2 DAYS Performed at [facility]    Report Status PENDING  Incomplete  Urine culture     Status: Abnormal   Collection Time: 12/03/15  8:15 PM  Result Value Ref Range Status   Specimen Description URINE, RANDOM  Final   Special Requests NONE  Final   Culture (A)  Final    <10,000 COLONIES/mL INSIGNIFICANT GROWTH Performed at [facility]    Report Status 12/05/2015 FINAL  Final  Gastrointestinal Panel by PCR , Stool     Status: None   Collection Time: 12/04/15  9:47 AM  Result Value Ref Range Status   Campylobacter species NOT DETECTED NOT DETECTED Final   Plesimonas shigelloides NOT DETECTED NOT DETECTED Final   Salmonella species NOT DETECTED NOT DETECTED Final   Yersinia enterocolitica NOT DETECTED NOT DETECTED Final   Vibrio species NOT DETECTED NOT DETECTED Final   Vibrio cholerae NOT DETECTED NOT DETECTED Final   Enteroaggregative E coli (EAEC) NOT DETECTED NOT DETECTED Final   Enteropathogenic E coli (EPEC) NOT DETECTED NOT DETECTED Final   Enterotoxigenic E coli (ETEC) NOT DETECTED NOT DETECTED Final   Shiga like toxin producing E coli (STEC) NOT DETECTED NOT DETECTED Final   Shigella/Enteroinvasive E coli (EIEC) NOT DETECTED NOT DETECTED Final   Cryptosporidium NOT DETECTED NOT DETECTED Final   Cyclospora cayetanensis NOT DETECTED NOT DETECTED Final   Entamoeba histolytica NOT DETECTED NOT DETECTED Final   Giardia lamblia NOT DETECTED NOT DETECTED Final   Adenovirus F40/41 NOT DETECTED NOT DETECTED Final   Astrovirus NOT DETECTED NOT DETECTED Final   Norovirus GI/GII NOT DETECTED NOT DETECTED Final   Rotavirus A NOT DETECTED NOT DETECTED Final   Sapovirus (I, II, IV, and V) NOT DETECTED NOT DETECTED Final  C difficile quick scan w PCR reflex     Status: None   Collection Time: 12/04/15  9:47 AM  Result Value Ref Range Status   C Diff antigen NEGATIVE NEGATIVE Final   C Diff toxin NEGATIVE NEGATIVE Final   C Diff  interpretation No C. difficile detected.  Final     Labs: Basic Metabolic Panel:  Recent Labs Lab 12/03/15 1728 12/03/15 1843 12/04/15 0233 12/04/15 0828 12/05/15 0510 12/05/15 1415  NA 140 138 140 140 135 133*  K 5.2* 5.0 4.0 3.8 3.0* 3.5  CL 103 108 111 109 100* 99*  CO2 13*  --  19* 19* 24 23  GLUCOSE 254* 188* 93 89 91 96  BUN 21* 28* 18 17 9 8   CREATININE 1.19 1.00 0.99 1.03 0.76 0.85  CALCIUM 9.4  --  7.5* 8.0* 8.1* 8.6*  MG  --   --  1.6*  --   --   --   PHOS  --   --  2.8  --   --   --  Liver Function Tests:  Recent Labs Lab 12/03/15 1728 12/04/15 0233 12/05/15 0510  AST 280* 140* 90*  ALT 63 41 35  ALKPHOS 90 62 59  BILITOT 1.3* 1.8* 2.1*  PROT 7.9 6.0* 6.2*  ALBUMIN 4.6 3.3* 3.5    Recent Labs Lab 12/03/15 1728  LIPASE 22   No results for input(s): AMMONIA in the last 168 hours. CBC:  Recent Labs Lab 12/03/15 1728 12/03/15 1843 12/03/15 2352 12/04/15 0233 12/05/15 0510  WBC 13.4*  --  9.1 9.2 5.9  NEUTROABS  --   --  7.4  --   --   HGB 14.1 15.0 10.8* 10.9* 12.5*  HCT 41.9 44.0 31.1* 31.5* 35.7*  MCV 88.8  --  85.7 85.6 85.8  PLT 233  --  168 166 175   Cardiac Enzymes:  Recent Labs Lab 12/03/15 2105  TROPONINI <0.03   BNP: BNP (last 3 results) No results for input(s): BNP in the last 8760 hours.  ProBNP (last 3 results) No results for input(s): PROBNP in the last 8760 hours.  CBG:  Recent Labs Lab 12/04/15 1726 12/04/15 1959 12/05/15 0009 12/05/15 0437 12/05/15 0731  GLUCAP 112* 106* 91 97 104*       Signed:  Amere Bricco S MD.  [facility] 12/05/2015, 3:06 PM

## 2015-12-08 LAB — CULTURE, BLOOD (ROUTINE X 2)
CULTURE: NO GROWTH
Culture: NO GROWTH

## 2017-04-12 ENCOUNTER — Emergency Department (HOSPITAL_COMMUNITY): Payer: Medicare Other

## 2017-04-12 ENCOUNTER — Emergency Department (HOSPITAL_COMMUNITY)
Admission: EM | Admit: 2017-04-12 | Discharge: 2017-04-12 | Disposition: A | Discharge status: Home / Self Care | Payer: Medicare Other | Attending: Emergency Medicine | Admitting: Emergency Medicine

## 2017-04-12 ENCOUNTER — Other Ambulatory Visit: Payer: Self-pay

## 2017-04-12 ENCOUNTER — Encounter (HOSPITAL_COMMUNITY): Payer: Self-pay | Admitting: Emergency Medicine

## 2017-04-12 DIAGNOSIS — R55 Syncope and collapse: Secondary | ICD-10-CM | POA: Insufficient documentation

## 2017-04-12 DIAGNOSIS — R197 Diarrhea, unspecified: Secondary | ICD-10-CM | POA: Insufficient documentation

## 2017-04-12 DIAGNOSIS — M545 Low back pain: Secondary | ICD-10-CM | POA: Diagnosis not present

## 2017-04-12 DIAGNOSIS — Z79899 Other long term (current) drug therapy: Secondary | ICD-10-CM | POA: Diagnosis not present

## 2017-04-12 DIAGNOSIS — K922 Gastrointestinal hemorrhage, unspecified: Secondary | ICD-10-CM | POA: Diagnosis not present

## 2017-04-12 DIAGNOSIS — Z87891 Personal history of nicotine dependence: Secondary | ICD-10-CM | POA: Diagnosis not present

## 2017-04-12 DIAGNOSIS — J449 Chronic obstructive pulmonary disease, unspecified: Secondary | ICD-10-CM | POA: Insufficient documentation

## 2017-04-12 DIAGNOSIS — K625 Hemorrhage of anus and rectum: Secondary | ICD-10-CM | POA: Diagnosis present

## 2017-04-12 LAB — PROTIME-INR
INR: 1.26
PROTHROMBIN TIME: 15.7 s — AB (ref 11.4–15.2)

## 2017-04-12 LAB — COMPREHENSIVE METABOLIC PANEL
ALK PHOS: 70 U/L (ref 38–126)
ALT: 18 U/L (ref 17–63)
AST: 43 U/L — AB (ref 15–41)
Albumin: 3.2 g/dL — ABNORMAL LOW (ref 3.5–5.0)
Anion gap: 12 (ref 5–15)
BILIRUBIN TOTAL: 0.7 mg/dL (ref 0.3–1.2)
BUN: 7 mg/dL (ref 6–20)
CALCIUM: 7.6 mg/dL — AB (ref 8.9–10.3)
CHLORIDE: 107 mmol/L (ref 101–111)
CO2: 16 mmol/L — ABNORMAL LOW (ref 22–32)
CREATININE: 1.79 mg/dL — AB (ref 0.61–1.24)
GFR, EST AFRICAN AMERICAN: 44 mL/min — AB (ref >60)
GFR, EST NON AFRICAN AMERICAN: 38 mL/min — AB (ref >60)
Glucose, Bld: 114 mg/dL — ABNORMAL HIGH (ref 65–99)
Potassium: 3.3 mmol/L — ABNORMAL LOW (ref 3.5–5.1)
Sodium: 135 mmol/L (ref 135–145)
Total Protein: 5.7 g/dL — ABNORMAL LOW (ref 6.5–8.1)

## 2017-04-12 LAB — CBC WITH DIFFERENTIAL/PLATELET
BASOS PCT: 0 %
Basophils Absolute: 0 10*3/uL (ref 0.0–0.1)
EOS ABS: 0.2 10*3/uL (ref 0.0–0.7)
EOS PCT: 4 %
HCT: 39.6 % (ref 39.0–52.0)
Hemoglobin: 13.5 g/dL (ref 13.0–17.0)
LYMPHS ABS: 0.9 10*3/uL (ref 0.7–4.0)
Lymphocytes Relative: 17 %
MCH: 29.2 pg (ref 26.0–34.0)
MCHC: 34.1 g/dL (ref 30.0–36.0)
MCV: 85.7 fL (ref 78.0–100.0)
Monocytes Absolute: 0.2 10*3/uL (ref 0.1–1.0)
Monocytes Relative: 5 %
NEUTROS PCT: 74 %
Neutro Abs: 3.6 10*3/uL (ref 1.7–7.7)
PLATELETS: 184 10*3/uL (ref 150–400)
RBC: 4.62 MIL/uL (ref 4.22–5.81)
RDW: 13.4 % (ref 11.5–15.5)
WBC: 4.9 10*3/uL (ref 4.0–10.5)

## 2017-04-12 LAB — I-STAT CHEM 8, ED
BUN: 7 mg/dL (ref 6–20)
CALCIUM ION: 1.03 mmol/L — AB (ref 1.15–1.40)
CHLORIDE: 106 mmol/L (ref 101–111)
CREATININE: 1.8 mg/dL — AB (ref 0.61–1.24)
GLUCOSE: 112 mg/dL — AB (ref 65–99)
HCT: 41 % (ref 39.0–52.0)
Hemoglobin: 13.9 g/dL (ref 13.0–17.0)
POTASSIUM: 3.4 mmol/L — AB (ref 3.5–5.1)
Sodium: 139 mmol/L (ref 135–145)
TCO2: 19 mmol/L — ABNORMAL LOW (ref 22–32)

## 2017-04-12 LAB — TYPE AND SCREEN
ABO/RH(D): O POS
ANTIBODY SCREEN: NEGATIVE

## 2017-04-12 LAB — ABO/RH: ABO/RH(D): O POS

## 2017-04-12 MED ORDER — PANTOPRAZOLE SODIUM 40 MG IV SOLR
40.0000 mg | Freq: Once | INTRAVENOUS | Status: AC
  Administered 2017-04-12: 40 mg via INTRAVENOUS
  Filled 2017-04-12: qty 40

## 2017-04-12 MED ORDER — FENTANYL CITRATE (PF) 100 MCG/2ML IJ SOLN
25.0000 ug | Freq: Once | INTRAMUSCULAR | Status: AC
  Administered 2017-04-12: 25 ug via INTRAVENOUS
  Filled 2017-04-12: qty 2

## 2017-04-12 MED ORDER — SODIUM CHLORIDE 0.9 % IV SOLN
INTRAVENOUS | Status: DC
  Administered 2017-04-12: 11:00:00 via INTRAVENOUS

## 2017-04-12 MED ORDER — TRAMADOL HCL 50 MG PO TABS: 50.0000 mg | ORAL_TABLET | Freq: Four times a day (QID) | ORAL | 0 refills | Status: DC | PRN

## 2017-04-12 MED ORDER — IOPAMIDOL (ISOVUE-300) INJECTION 61%
INTRAVENOUS | Status: AC
  Administered 2017-04-12: 80 mL
  Filled 2017-04-12: qty 100

## 2017-04-12 MED ORDER — FENTANYL CITRATE (PF) 100 MCG/2ML IJ SOLN: 50.0000 ug | Freq: Once | INTRAMUSCULAR | Status: DC

## 2017-04-12 NOTE — ED Triage Notes (Signed)
Patient presents to the ED from PCP. Patient reports he went for chronic back pain. Per EMS patient had syncope episode  That lasted 1-2 minutes. Reports initial Bp 60/40 patient given 1500cc fluids last pressure with EMS 133/80. Patient alert and oriented. Patient reports back pain 10/10. Patient denies  any abdominal pain or chest pain. Patient states Dirrahea  for the last week but no bleeding. Patient denies being on any blood thinners.

## 2017-04-12 NOTE — ED Provider Notes (Signed)
Medical screening examination/treatment/procedure(s) were conducted as a shared visit with non-physician practitioner(s) and myself.  I personally evaluated the patient during the encounter.   EKG Interpretation  Date/Time:  Thursday April 12 2017 10:31:26 EDT Ventricular Rate:  83 PR Interval:    QRS Duration: 109 QT Interval:  401 QTC Calculation: 472 R Axis:   101 Text Interpretation:  Sinus rhythm Right ventricular hypertrophy No STEMI.  Confirmed by Alona BeneLong, Joshua [phone]([id number]) on 04/13/2017 1:50:34 PM     Patient presents from outpatient primary care office.  Reports he was being seen for lower back pain that he has had fairly persistently.  He indicates SI region and pain that would be consistent with musculoskeletal back pain.  Patient incidentally has had diarrhea for the past week.  This was not the reason for his evaluation.  Patient is alert and nontoxic.  Appearance is well.  Heart is regular.  Lungs grossly clear.  Abdomen soft without guarding.  Examination of rectal area shows diarrheal stool with intermingled red blood.  No melena or cranberry stool.  Overall diagnostic workup within normal limits.  Vital signs remained stable.  At this time, it appears very likely patient had vasovagal episode with diarrheal bowel movement.  Patient had attributed recent diarrhea to something he ate, this may be food borne or infectious.  CT does not show acute findings.  Patient clinically well and stable appearance, plan for patient to follow-up with PCP and get scheduled colonoscopy.  Return precautions have been reviewed.    [name], [name], MD 04/18/17 1527

## 2017-04-12 NOTE — ED Notes (Signed)
POC occult blood test was done and when Liquid was placed on card to do test the results never changed, I was told that blood was seen on sheets, But Test never said otherwise, So Dr. Donnald GarrePfeiffer was informed she told me to put it was a Test Error.

## 2017-04-12 NOTE — ED Provider Notes (Signed)
[facility] EMERGENCY DEPARTMENT Provider Note   CSN: [id number] Arrival date & time: 04/12/17  1017     History   Chief Complaint Chief Complaint  Patient presents with  . Blood In Stools    HPI Micheal Carey is a 67 y.o. male.  The history is provided by the patient and medical records. No language interpreter was used.   Micheal Carey is a 67 y.o. male  with a PMH as listed below who presents to the Emergency Department complaining of syncopal episode and rectal bleeding which began today.  Patient reports that he was actually going to his primary care doctor for back pain that has been progressively worsening over the last few days.  While he was at the office, patient had a syncopal episode.  Per staff, this lasted about 1-2 minutes.  Pressure was checked and was 60/40.  It was then noticed that he had bright red blood in the back of his pants.  Staff at PCP report bright red blood per rectum.  He denies any history of this in the past.  He has had diarrhea for the last week after eating something badly.  His wife ate the same and was also sick.  Here he denies any blood in the stools previously.  [facility] and was told to have another in 5 years.  He does not know results of this.  He was given 1500 cc of fluids per EMS in route with last blood pressure of 133/80.  Upon ER arrival, he is still reports having bleeding per rectum and low back pain, mostly to the right.  He denies any abdominal pain.  He is not on any anticoagulants.   Past Medical History:  Diagnosis Date  . CAP (community acquired pneumonia) 10/14/2015  . Pneumonia    "twice before" (10/14/2015)    Patient Active Problem List   Diagnosis Date Noted  . COPD (chronic obstructive pulmonary disease) (HCC) 12/03/2015  . Hypoglycemia 12/03/2015  . Sepsis (HCC) 12/03/2015  . Intractable vomiting with nausea 10/15/2015  . Transaminasemia 10/15/2015  .  Diarrhea 10/15/2015  . Lactic acidosis 10/14/2015  . AKI (acute kidney injury) (HCC) 10/14/2015  . Alcohol abuse 10/14/2015  . COPD with acute exacerbation (HCC) 10/14/2015  . CAP (community acquired pneumonia) 10/14/2015  . Atypical lymphocytes present on peripheral blood smear   . Dehydration     Past Surgical History:  Procedure Laterality Date  . JOINT REPLACEMENT    . TOTAL HIP ARTHROPLASTY Right        Home Medications    Prior to Admission medications   Medication Sig Start Date End Date Taking? Authorizing Provider  albuterol (PROVENTIL HFA;VENTOLIN HFA) 108 (90 Base) MCG/ACT inhaler Inhale 2 puffs into the lungs every 6 (six) hours as needed for wheezing or shortness of breath.   Yes [provider]  albuterol (PROVENTIL) (2.5 MG/3ML) 0.083% nebulizer solution Take 2.5 mg by nebulization every 6 (six) hours as needed for wheezing or shortness of breath.   Yes [provider]  budesonide-formoterol (SYMBICORT) 160-4.5 MCG/ACT inhaler Inhale 2 puffs into the lungs 2 (two) times daily.   Yes [provider]  Cholecalciferol (VITAMIN D PO) Take 1 tablet by mouth daily.   Yes [provider]  diclofenac (VOLTAREN) 75 MG EC tablet Take 75 mg by mouth daily as needed for pain. 03/01/17  Yes [provider]  Multiple Vitamins-Minerals (MULTIVITAMIN WITH MINERALS)  tablet Take 1 tablet by mouth daily.   Yes [provider]  potassium chloride SA (K-DUR,KLOR-CON) 20 MEQ tablet Take 20 mEq by mouth 2 (two) times daily.   Yes [provider]  sildenafil (VIAGRA) 100 MG tablet Take 100 mg by mouth daily as needed for erectile dysfunction.   Yes [provider]    Family History Family History  Problem Relation Age of Onset  . Diabetes Mother   . Cancer Mother   . Diabetes Father   . Stroke Neg Hx   . CAD Neg Hx     Social History Social History   Tobacco Use  . Smoking status: Former Games developer  . Smokeless  tobacco: Never Used  Substance Use Topics  . Alcohol use: Yes    Comment: every day beer 4-5 cans   . Drug use: Not on file     Allergies   Patient has no known allergies.   Review of Systems Review of Systems  Gastrointestinal: Positive for blood in stool and diarrhea. Negative for abdominal pain, constipation, nausea and vomiting.  Neurological: Positive for syncope and weakness.  All other systems reviewed and are negative.    Physical Exam Updated Vital Signs BP (!) 149/68   Pulse 86   Temp 97.7 F (36.5 C) (Oral)   Resp 14   SpO2 98%   Physical Exam  Constitutional: He is oriented to person, place, and time. He appears well-developed and well-nourished. No distress.  HENT:  Head: Normocephalic and atraumatic.  Cardiovascular: Normal rate, regular rhythm and normal heart sounds.  No murmur heard. Pulmonary/Chest: Effort normal and breath sounds normal. No respiratory distress.  Abdominal: Soft. Bowel sounds are normal. He exhibits no distension.  No abdominal tenderness.  Genitourinary:  Genitourinary Comments: BRBPR. Bright red blood soaked through boxers.  Musculoskeletal:  Tenderness palpation of right lumbar paraspinal musculature.  No midline tenderness.  Neurological: He is alert and oriented to person, place, and time.  Skin: Skin is warm and dry.  Nursing note and vitals reviewed.    ED Treatments / Results  Labs (all labs ordered are listed, but only abnormal results are displayed) Labs Reviewed  COMPREHENSIVE METABOLIC PANEL - Abnormal; Notable for the following components:      Result Value   Potassium 3.3 (*)    CO2 16 (*)    Glucose, Bld 114 (*)    Creatinine, Ser 1.79 (*)    Calcium 7.6 (*)    Total Protein 5.7 (*)    Albumin 3.2 (*)    AST 43 (*)    GFR calc non Af Amer 38 (*)    GFR calc Af Amer 44 (*)    All other components within normal limits  PROTIME-INR - Abnormal; Notable for the following components:   Prothrombin Time 15.7  (*)    All other components within normal limits  I-STAT CHEM 8, ED - Abnormal; Notable for the following components:   Potassium 3.4 (*)    Creatinine, Ser 1.80 (*)    Glucose, Bld 112 (*)    Calcium, Ion 1.03 (*)    TCO2 19 (*)    All other components within normal limits  CBC WITH DIFFERENTIAL/PLATELET  POC OCCULT BLOOD, ED  TYPE AND SCREEN  ABO/RH    EKG  EKG Interpretation None       Radiology Ct Abdomen Pelvis W Contrast  Result Date: 04/12/2017 CLINICAL DATA:  Pneumonia.  Diarrhea and GI bleed. EXAM: CT ABDOMEN AND PELVIS WITH  CONTRAST TECHNIQUE: Multidetector CT imaging of the abdomen and pelvis was performed using the standard protocol following bolus administration of intravenous contrast. CONTRAST:  80mL ISOVUE-300 IOPAMIDOL (ISOVUE-300) INJECTION 61% COMPARISON:  Ultrasound of abdomen October 15, 2015 FINDINGS: Lower chest: No acute abnormality. Hepatobiliary: There is mild diffuse low density of the liver without vessel displacement. No focal liver lesion is identified. The gallbladder is normal. The biliary tree is normal. Pancreas: Unremarkable. No pancreatic ductal dilatation or surrounding inflammatory changes. Spleen: Normal in size without focal abnormality. Adrenals/Urinary Tract: The adrenal glands are normal. There small cyst in bilateral kidneys. There is no hydronephrosis bilaterally. The bladder is normal. Stomach/Bowel: There is a small hiatal hernia. Stomach is otherwise within normal limits. Appendix appears normal. No evidence of bowel wall thickening, distention, or inflammatory changes. Vascular/Lymphatic: No significant vascular findings are present. No enlarged abdominal or pelvic lymph nodes. Reproductive: Prostate is unremarkable. Other: Focal umbilical herniation of mesenteric fat is identified. Musculoskeletal: Degenerative joint changes of the spine are noted. IMPRESSION: No acute abnormality identified in the abdomen and pelvis. Mild fatty  infiltration of liver. Small hiatal hernia. There is no small bowel obstruction or diverticulitis. Electronically Signed   By: Sherian ReinWei-Chen  Lin M.D.   On: 04/12/2017 14:35    Procedures Procedures (including critical care time)  Medications Ordered in ED Medications  0.9 %  sodium chloride infusion ( Intravenous New Bag/Given 04/12/17 1052)  pantoprazole (PROTONIX) injection 40 mg (40 mg Intravenous Given 04/12/17 1052)  iopamidol (ISOVUE-300) 61 % injection (80 mLs  Contrast Given 04/12/17 1229)  fentaNYL (SUBLIMAZE) injection 25 mcg (25 mcg Intravenous Given 04/12/17 1356)     Initial Impression / Assessment and Plan / ED Course  I have reviewed the triage vital signs and the nursing notes.  Pertinent labs & imaging results that were available during my care of the patient were reviewed by me and considered in my medical decision making (see chart for details).    Micheal Carey is a 67 y.o. male who presents to ED for BRBPR today.  Patient was at his primary care doctor's office where he had syncopal episode and passed large diarrhea, bloody stool.  PCP noticed bleeding on patient's.  Per EMS, hypotensive in field, this had resolved by the time of ER arrival.  He was given 1500 cc fluids in route.  Patient has remained hemodynamically stable with no hypotension throughout ER stay.  No tachycardia.  Hemoglobin WDL.  He is not on any anticoagulation.  CT abdomen/pelvis with no acute findings.  He has had no further bleeding since he arrived in the emergency department.  He has been having diarrhea for the last 4-5 days which she attributes to eating something bad as his wife also had similar symptoms after meal.  Patient would not like admission for further workup.  He states multiple times that he would like to go home.  I spoke with on-call gastroenterology with [facility], who does recommend observation for 24 hours given bleeding, but states that it is reasonable given no anticoagulation,  hemodynamically stable, no further bleeding while in ED to have him follow up outpatient. He will need colonoscopy. Discussed risks of discharge vs. Admission with patient and significant other at length. They understand this and patient still would like to go home. He reports that he can see his PCP tomorrow and get colonoscopy scheduled. He understands he MUST return immediately if bleeding returns or if he feels weak. All questions answered.  Patient seen by and discussed  with Dr. Donnald Garre who agrees with treatment plan.    Final Clinical Impressions(s) / ED Diagnoses   Final diagnoses:  Rectal bleeding    ED Discharge Orders    None       Ollis Daudelin, Chase Picket, PA-C 04/12/17 1615    Arby Barrette, MD 04/18/17 1527

## 2017-04-12 NOTE — Discharge Instructions (Signed)
Please call your primary care doctor today or first thing in the morning.  I would like you to be seen as soon as possible.  You should return to the emergency department immediately for any further bleeding, weakness or if you feel as if you may pass out.  You can also return to ER for any additional concerns. Do not take any medications like aspirin, ibuprofen, motrin, advil, etc for your pain as this can cause bleeding.

## 2017-05-31 ENCOUNTER — Encounter: Payer: Self-pay | Admitting: Internal Medicine

## 2017-08-27 ENCOUNTER — Ambulatory Visit (INDEPENDENT_AMBULATORY_CARE_PROVIDER_SITE_OTHER): Payer: Self-pay

## 2017-08-27 ENCOUNTER — Encounter (INDEPENDENT_AMBULATORY_CARE_PROVIDER_SITE_OTHER): Payer: Self-pay | Admitting: Orthopaedic Surgery

## 2017-08-27 ENCOUNTER — Ambulatory Visit (INDEPENDENT_AMBULATORY_CARE_PROVIDER_SITE_OTHER): Payer: Medicare Other | Admitting: Orthopaedic Surgery

## 2017-08-27 DIAGNOSIS — Z96641 Presence of right artificial hip joint: Secondary | ICD-10-CM

## 2017-08-27 DIAGNOSIS — M7061 Trochanteric bursitis, right hip: Secondary | ICD-10-CM

## 2017-08-27 DIAGNOSIS — M25551 Pain in right hip: Secondary | ICD-10-CM | POA: Diagnosis not present

## 2017-08-27 MED ORDER — NAPROXEN 500 MG PO TABS: 500.0000 mg | ORAL_TABLET | Freq: Two times a day (BID) | ORAL | 1 refills | Status: DC | PRN

## 2017-08-27 MED ORDER — METHYLPREDNISOLONE 4 MG PO TABS: ORAL_TABLET | ORAL | 0 refills | Status: DC

## 2017-08-27 NOTE — Progress Notes (Signed)
Office Visit Note   Patient: Micheal Carey           Date of Birth: Nov 14, 1950           MRN: [id number] Visit Date: 08/27/2017              Requested by: Fleet ContrasAvbuere, Edwin, MD [address] [place], [place] [id number] PCP: Fleet ContrasAvbuere, Edwin, MD   Assessment & Plan: Visit Diagnoses:  1. Pain in right hip   2. Trochanteric bursitis, right hip   3. History of total hip arthroplasty, right     Plan: His exam and signs and symptoms seem to be more consistent with trochanteric bursitis of the right hip.  I told him about this in detail.  I showed him stretching exercises and he demonstrate these back to me.  He is a perfect candidate for outpatient physical therapy.  I will put him on a 6-day steroid taper and also have him take naproxen.  I offered a steroid injection in this area but he declined this.  I told him not to sleep on that side for the next month.  We will see him back in about 4 weeks to see how is doing overall.  All question concerns were answered and addressed.  Follow-Up Instructions: Return in about 1 month (around 09/24/2017).   Orders:  Orders Placed This Encounter  Procedures  . XR HIP UNILAT W OR W/O PELVIS 1V RIGHT   Meds ordered this encounter  Medications  . methylPREDNISolone (MEDROL) 4 MG tablet    Sig: Medrol dose pack. Take as instructed    Dispense:  21 tablet    Refill:  0  . naproxen (NAPROSYN) 500 MG tablet    Sig: Take 1 tablet (500 mg total) by mouth 2 (two) times daily between meals as needed.    Dispense:  60 tablet    Refill:  1      Procedures: No procedures performed   Clinical Data: No additional findings.   Subjective: Chief Complaint  Patient presents with  . Right Hip - Pain  . Left Hip - Pain  The patient sent to me from his primary care physician to evaluate acute right hip pain.  He has a history of a right total hip arthroplasty done 6 years ago in [place].  He is been having pain just on the trochanteric area of his hip is  where he points to for last 2 months with no known injury.  He denies any groin pain.  He says it hurts mainly when he lays on that side.  He is very thin individual.  He is on chronic opioids he states.  He does not take any anti-inflammatories.  He is not a diabetic.  He denies any recent injuries.  HPI  Review of Systems He currently denies any headache, chest pain, shortness of breath, fever, chills, nausea, vomiting.  Objective: Vital Signs: There were no vitals taken for this visit.  Physical Exam He is alert and oriented x3 and in no acute distress Ortho Exam Examination of his right hip shows fluid internal and external rotation with only pain over the trochanteric area.  His leg lengths are equal. Specialty Comments:  No specialty comments available.  Imaging: Xr Hip Unilat W Or W/o Pelvis 1v Right  Result Date: 08/27/2017 An AP pelvis and lateral of the right hip show a total hip arthroplasty on the right side with no complicating features or acute findings.    PMFS History: Patient  Active Problem List   Diagnosis Date Noted  . Trochanteric bursitis, right hip 08/27/2017  . History of total hip arthroplasty, right 08/27/2017  . COPD (chronic obstructive pulmonary disease) (HCC) 12/03/2015  . Hypoglycemia 12/03/2015  . Sepsis (HCC) 12/03/2015  . Intractable vomiting with nausea 10/15/2015  . Transaminasemia 10/15/2015  . Diarrhea 10/15/2015  . Lactic acidosis 10/14/2015  . AKI (acute kidney injury) (HCC) 10/14/2015  . Alcohol abuse 10/14/2015  . COPD with acute exacerbation (HCC) 10/14/2015  . CAP (community acquired pneumonia) 10/14/2015  . Atypical lymphocytes present on peripheral blood smear   . Dehydration    Past Medical History:  Diagnosis Date  . CAP (community acquired pneumonia) 10/14/2015  . Pneumonia    "twice before" (10/14/2015)    Family History  Problem Relation Age of Onset  . Diabetes Mother   . Cancer Mother   . Diabetes Father   .  Stroke Neg Hx   . CAD Neg Hx     Past Surgical History:  Procedure Laterality Date  . JOINT REPLACEMENT    . TOTAL HIP ARTHROPLASTY Right    Social History   Occupational History  . Not on file  Tobacco Use  . Smoking status: Former Games developer  . Smokeless tobacco: Never Used  Substance and Sexual Activity  . Alcohol use: Yes    Comment: every day beer 4-5 cans   . Drug use: Not on file  . Sexual activity: Not on file

## 2017-08-28 ENCOUNTER — Other Ambulatory Visit (INDEPENDENT_AMBULATORY_CARE_PROVIDER_SITE_OTHER): Payer: Self-pay

## 2017-08-28 DIAGNOSIS — M25551 Pain in right hip: Secondary | ICD-10-CM

## 2017-09-24 ENCOUNTER — Ambulatory Visit (INDEPENDENT_AMBULATORY_CARE_PROVIDER_SITE_OTHER): Payer: Medicare Other | Admitting: Orthopaedic Surgery

## 2017-10-27 ENCOUNTER — Encounter (HOSPITAL_COMMUNITY): Payer: Self-pay | Admitting: Emergency Medicine

## 2017-10-27 ENCOUNTER — Emergency Department (HOSPITAL_COMMUNITY): Payer: Medicare Other

## 2017-10-27 ENCOUNTER — Other Ambulatory Visit: Payer: Self-pay

## 2017-10-27 ENCOUNTER — Emergency Department (HOSPITAL_COMMUNITY)
Admission: EM | Admit: 2017-10-27 | Discharge: 2017-10-28 | Disposition: A | Discharge status: Home / Self Care | Payer: Medicare Other | Source: Home / Self Care | Attending: Emergency Medicine | Admitting: Emergency Medicine

## 2017-10-27 ENCOUNTER — Emergency Department (HOSPITAL_COMMUNITY)
Admission: EM | Admit: 2017-10-27 | Discharge: 2017-10-27 | Disposition: A | Discharge status: Home / Self Care | Payer: Medicare Other | Attending: Emergency Medicine | Admitting: Emergency Medicine

## 2017-10-27 DIAGNOSIS — Z96641 Presence of right artificial hip joint: Secondary | ICD-10-CM | POA: Insufficient documentation

## 2017-10-27 DIAGNOSIS — R1110 Vomiting, unspecified: Secondary | ICD-10-CM | POA: Diagnosis not present

## 2017-10-27 DIAGNOSIS — Z79899 Other long term (current) drug therapy: Secondary | ICD-10-CM

## 2017-10-27 DIAGNOSIS — R112 Nausea with vomiting, unspecified: Secondary | ICD-10-CM

## 2017-10-27 DIAGNOSIS — F1123 Opioid dependence with withdrawal: Secondary | ICD-10-CM

## 2017-10-27 DIAGNOSIS — F1193 Opioid use, unspecified with withdrawal: Secondary | ICD-10-CM

## 2017-10-27 DIAGNOSIS — J449 Chronic obstructive pulmonary disease, unspecified: Secondary | ICD-10-CM | POA: Insufficient documentation

## 2017-10-27 DIAGNOSIS — R42 Dizziness and giddiness: Secondary | ICD-10-CM

## 2017-10-27 DIAGNOSIS — R079 Chest pain, unspecified: Secondary | ICD-10-CM

## 2017-10-27 DIAGNOSIS — R197 Diarrhea, unspecified: Secondary | ICD-10-CM | POA: Insufficient documentation

## 2017-10-27 DIAGNOSIS — R531 Weakness: Secondary | ICD-10-CM | POA: Diagnosis present

## 2017-10-27 DIAGNOSIS — Z87891 Personal history of nicotine dependence: Secondary | ICD-10-CM

## 2017-10-27 LAB — URINALYSIS, ROUTINE W REFLEX MICROSCOPIC
Bacteria, UA: NONE SEEN
Bilirubin Urine: NEGATIVE
Glucose, UA: NEGATIVE mg/dL
Ketones, ur: 20 mg/dL — AB
Leukocytes, UA: NEGATIVE
Nitrite: NEGATIVE
Protein, ur: 100 mg/dL — AB
Specific Gravity, Urine: 1.021 (ref 1.005–1.030)
pH: 5 (ref 5.0–8.0)

## 2017-10-27 LAB — CBC WITH DIFFERENTIAL/PLATELET
BASOS ABS: 0 10*3/uL (ref 0.0–0.1)
Basophils Relative: 0 %
EOS PCT: 1 %
Eosinophils Absolute: 0.1 10*3/uL (ref 0.0–0.7)
HCT: 41.7 % (ref 39.0–52.0)
Hemoglobin: 14.5 g/dL (ref 13.0–17.0)
LYMPHS PCT: 7 %
Lymphs Abs: 0.7 10*3/uL (ref 0.7–4.0)
MCH: 29.2 pg (ref 26.0–34.0)
MCHC: 34.8 g/dL (ref 30.0–36.0)
MCV: 83.9 fL (ref 78.0–100.0)
Monocytes Absolute: 0.8 10*3/uL (ref 0.1–1.0)
Monocytes Relative: 10 %
Neutro Abs: 7.1 10*3/uL (ref 1.7–7.7)
Neutrophils Relative %: 82 %
PLATELETS: 236 10*3/uL (ref 150–400)
RBC: 4.97 MIL/uL (ref 4.22–5.81)
RDW: 12.8 % (ref 11.5–15.5)
WBC: 8.7 10*3/uL (ref 4.0–10.5)

## 2017-10-27 LAB — I-STAT TROPONIN, ED: Troponin i, poc: 0 ng/mL (ref 0.00–0.08)

## 2017-10-27 LAB — COMPREHENSIVE METABOLIC PANEL
ALT: 28 U/L (ref 0–44)
AST: 50 U/L — ABNORMAL HIGH (ref 15–41)
Albumin: 4.7 g/dL (ref 3.5–5.0)
Alkaline Phosphatase: 91 U/L (ref 38–126)
Anion gap: 15 (ref 5–15)
BUN: 14 mg/dL (ref 8–23)
CHLORIDE: 103 mmol/L (ref 98–111)
CO2: 24 mmol/L (ref 22–32)
Calcium: 9.8 mg/dL (ref 8.9–10.3)
Creatinine, Ser: 0.99 mg/dL (ref 0.61–1.24)
GFR calc Af Amer: >60 mL/min (ref >60)
Glucose, Bld: 108 mg/dL — ABNORMAL HIGH (ref 70–99)
Potassium: 3.3 mmol/L — ABNORMAL LOW (ref 3.5–5.1)
Sodium: 142 mmol/L (ref 135–145)
Total Bilirubin: 2 mg/dL — ABNORMAL HIGH (ref 0.3–1.2)
Total Protein: 8.2 g/dL — ABNORMAL HIGH (ref 6.5–8.1)

## 2017-10-27 LAB — BASIC METABOLIC PANEL
Anion gap: 15 (ref 5–15)
BUN: 14 mg/dL (ref 8–23)
CO2: 24 mmol/L (ref 22–32)
Calcium: 9.4 mg/dL (ref 8.9–10.3)
Chloride: 103 mmol/L (ref 98–111)
Creatinine, Ser: 1.09 mg/dL (ref 0.61–1.24)
GFR calc Af Amer: >60 mL/min (ref >60)
GFR calc non Af Amer: >60 mL/min (ref >60)
Glucose, Bld: 109 mg/dL — ABNORMAL HIGH (ref 70–99)
Potassium: 4.3 mmol/L (ref 3.5–5.1)
Sodium: 142 mmol/L (ref 135–145)

## 2017-10-27 LAB — CBC
HCT: 43.2 % (ref 39.0–52.0)
Hemoglobin: 15.1 g/dL (ref 13.0–17.0)
MCH: 29.2 pg (ref 26.0–34.0)
MCHC: 35 g/dL (ref 30.0–36.0)
MCV: 83.6 fL (ref 78.0–100.0)
PLATELETS: 245 10*3/uL (ref 150–400)
RBC: 5.17 MIL/uL (ref 4.22–5.81)
RDW: 12.8 % (ref 11.5–15.5)
WBC: 12.6 10*3/uL — ABNORMAL HIGH (ref 4.0–10.5)

## 2017-10-27 LAB — ETHANOL

## 2017-10-27 LAB — POCT I-STAT TROPONIN I: Troponin i, poc: 0 ng/mL (ref 0.00–0.08)

## 2017-10-27 MED ORDER — OXYCODONE-ACETAMINOPHEN 5-325 MG PO TABS: 1.0000 | ORAL_TABLET | Freq: Four times a day (QID) | ORAL | 0 refills | Status: DC | PRN

## 2017-10-27 MED ORDER — SODIUM CHLORIDE 0.9 % IV BOLUS
1000.0000 mL | Freq: Once | INTRAVENOUS | Status: AC
  Administered 2017-10-27: 1000 mL via INTRAVENOUS

## 2017-10-27 MED ORDER — ONDANSETRON HCL 4 MG/2ML IJ SOLN
4.0000 mg | Freq: Once | INTRAMUSCULAR | Status: AC
  Administered 2017-10-27: 4 mg via INTRAVENOUS
  Filled 2017-10-27: qty 2

## 2017-10-27 MED ORDER — CLONIDINE HCL 0.1 MG PO TABS
0.2000 mg | ORAL_TABLET | Freq: Once | ORAL | Status: AC
  Administered 2017-10-27: 0.2 mg via ORAL
  Filled 2017-10-27: qty 2

## 2017-10-27 MED ORDER — MORPHINE SULFATE (PF) 4 MG/ML IV SOLN
4.0000 mg | Freq: Once | INTRAVENOUS | Status: AC
  Administered 2017-10-27: 4 mg via INTRAVENOUS
  Filled 2017-10-27: qty 1

## 2017-10-27 MED ORDER — CLONIDINE HCL 0.1 MG PO TABS: 0.1000 mg | ORAL_TABLET | Freq: Four times a day (QID) | ORAL | 0 refills | Status: DC

## 2017-10-27 MED ORDER — IBUPROFEN 200 MG PO TABS
400.0000 mg | ORAL_TABLET | Freq: Once | ORAL | Status: AC
  Administered 2017-10-27: 400 mg via ORAL
  Filled 2017-10-27: qty 2

## 2017-10-27 MED ORDER — HYDROCODONE-ACETAMINOPHEN 5-325 MG PO TABS
1.0000 | ORAL_TABLET | Freq: Once | ORAL | Status: DC
  Filled 2017-10-27: qty 1

## 2017-10-27 MED ORDER — ONDANSETRON 4 MG PO TBDP: 4.0000 mg | ORAL_TABLET | Freq: Three times a day (TID) | ORAL | 0 refills | Status: DC | PRN

## 2017-10-27 MED ORDER — LOPERAMIDE HCL 2 MG PO CAPS: 2.0000 mg | ORAL_CAPSULE | Freq: Four times a day (QID) | ORAL | 0 refills | Status: DC | PRN

## 2017-10-27 MED ORDER — OXYCODONE-ACETAMINOPHEN 5-325 MG PO TABS: 1.0000 | ORAL_TABLET | Freq: Four times a day (QID) | ORAL | Status: DC | PRN

## 2017-10-27 MED ORDER — OXYCODONE-ACETAMINOPHEN 5-325 MG PO TABS
1.0000 | ORAL_TABLET | Freq: Once | ORAL | Status: AC
  Administered 2017-10-27: 1 via ORAL
  Filled 2017-10-27: qty 1

## 2017-10-27 NOTE — ED Notes (Signed)
PA made aware of pt's pain 8/10
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
## 2017-10-27 NOTE — ED Triage Notes (Signed)
Patient arrives via EMS with c/o dizziness and nausea. Patient seen here today for same, patient symptoms have not improved.  BP: 158/88 HR: 93 SPO2: 100%  CBG: 120
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
## 2017-10-27 NOTE — ED Triage Notes (Signed)
Patient reports midsternal chest pain.
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
## 2017-10-27 NOTE — Discharge Instructions (Signed)
Follow-up with your primary care doctor in the next 3 to 4 days for further evaluation.  As we discussed, you can talk with them about refilling her pain medication.  Return to emergency department for any difficulty walking, numbness/weakness of her arms or legs, chest pain, difficulty breathing, abdominal pain, nausea/vomiting or any other worsening or concerning symptoms.

## 2017-10-27 NOTE — ED Notes (Signed)
EKG shown to Dr. Isaacs. 
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
## 2017-10-27 NOTE — Discharge Instructions (Signed)
Please read and follow all provided instructions.  Your diagnoses today include:  1. Opioid withdrawal (HCC)   2. Chest pain, unspecified type     Tests performed today include:  An EKG of your heart  A chest x-ray  Cardiac enzymes - a blood test for heart muscle damage  Blood counts and electrolytes  Vital signs. See below for your results today.   Medications prescribed:   Zofran (ondansetron) - for nausea and vomiting   Percocet (oxycodone/acetaminophen) - narcotic pain medication  It is your responsibility to make sure your pain management clinic is okay with you filling this medication as to not break any medication contract.   DO NOT drive or perform any activities that require you to be awake and alert because this medicine can make you drowsy. BE VERY CAREFUL not to take multiple medicines containing Tylenol (also called acetaminophen). Doing so can lead to an overdose which can damage your liver and cause liver failure and possibly death.  Take any prescribed medications only as directed.  Follow-up instructions: Please follow-up with your pain management and primary care doctor as soon as you can for further evaluation of your symptoms.   Return instructions:  SEEK IMMEDIATE MEDICAL ATTENTION IF:  You have severe chest pain, especially if the pain is crushing or pressure-like and spreads to the arms, back, neck, or jaw, or if you have sweating, nausea (feeling sick to your stomach), or shortness of breath. THIS IS AN EMERGENCY. Don't wait to see if the pain will go away. Get medical help at once. Call 911 or 0 (operator). DO NOT drive yourself to the hospital.   Your chest pain gets worse and does not go away with rest.   You have an attack of chest pain lasting longer than usual, despite rest and treatment with the medications your caregiver has prescribed.   You wake from sleep with chest pain or shortness of breath.  You feel dizzy or faint.  You have  chest pain not typical of your usual pain for which you originally saw your caregiver.   You have any other emergent concerns regarding your health.  Additional Information: Chest pain comes from many different causes. Your caregiver has diagnosed you as having chest pain that is not specific for one problem, but does not require admission.  You are at low risk for an acute heart condition or other serious illness.   Your vital signs today were: BP (!) 169/99    Pulse 95    Temp 99.4 F (37.4 C) (Oral)    Resp (!) 22    Ht 5\' 8"  (1.727 m)    Wt 68 kg    SpO2 96%    BMI 22.81 kg/m  If your blood pressure (BP) was elevated above 135/85 this visit, please have this repeated by your doctor within one month. --------------

## 2017-10-27 NOTE — ED Notes (Signed)
Taking sips of ginger ale and tol well

## 2018-03-11 ENCOUNTER — Ambulatory Visit (INDEPENDENT_AMBULATORY_CARE_PROVIDER_SITE_OTHER): Payer: Medicare Other | Admitting: Physician Assistant

## 2018-03-11 ENCOUNTER — Encounter (INDEPENDENT_AMBULATORY_CARE_PROVIDER_SITE_OTHER): Payer: Self-pay | Admitting: Physician Assistant

## 2018-03-11 ENCOUNTER — Ambulatory Visit (INDEPENDENT_AMBULATORY_CARE_PROVIDER_SITE_OTHER): Payer: Self-pay

## 2018-03-11 ENCOUNTER — Other Ambulatory Visit (INDEPENDENT_AMBULATORY_CARE_PROVIDER_SITE_OTHER): Payer: Self-pay

## 2018-03-11 ENCOUNTER — Ambulatory Visit (INDEPENDENT_AMBULATORY_CARE_PROVIDER_SITE_OTHER): Payer: Medicare Other

## 2018-03-11 DIAGNOSIS — G8929 Other chronic pain: Secondary | ICD-10-CM | POA: Diagnosis not present

## 2018-03-11 DIAGNOSIS — Z96641 Presence of right artificial hip joint: Secondary | ICD-10-CM | POA: Diagnosis not present

## 2018-03-11 DIAGNOSIS — M25551 Pain in right hip: Secondary | ICD-10-CM | POA: Diagnosis not present

## 2018-03-11 DIAGNOSIS — M545 Low back pain, unspecified: Secondary | ICD-10-CM

## 2018-03-11 MED ORDER — METHYLPREDNISOLONE ACETATE 40 MG/ML IJ SUSP
40.0000 mg | INTRAMUSCULAR | Status: AC | PRN
  Administered 2018-03-11: 40 mg via INTRA_ARTICULAR

## 2018-03-11 MED ORDER — LIDOCAINE HCL 1 % IJ SOLN
3.0000 mL | INTRAMUSCULAR | Status: AC | PRN
  Administered 2018-03-11: 3 mL

## 2018-03-11 NOTE — Progress Notes (Addendum)
Office Visit Note   Patient: Micheal Carey           Date of Birth: 31-May-1950           MRN: [id number] Visit Date: 03/11/2018              Requested by: Fleet Contras, MD [address] [place], [place] [address] PCP: Fleet Contras, MD   Assessment & Plan: Visit Diagnoses:  1. Pain in right hip   2. Chronic right-sided low back pain, unspecified whether sciatica present     Plan: Plan we will see him back in 1 month to check his progress lack of.  Recommend that he go to formal physical therapy for core strengthening, back exercises, stretching, IT band stretching, home exercise program and modalities.  Questions encouraged and answered at length.  IT band stretching exercises shown to the patient today.  Patient tolerated trochanteric injection on the right today well .  Follow-Up Instructions: Return in about 4 weeks (around 04/08/2018).   Orders:  Orders Placed This Encounter  Procedures  . Large Joint Inj: R greater trochanter  . XR HIP UNILAT W OR W/O PELVIS 2-3 VIEWS RIGHT  . XR Lumbar Spine 2-3 Views   Meds ordered this encounter  Medications  . lidocaine (XYLOCAINE) 1 % (with pres) injection 3 mL  . methylPREDNISolone acetate (DEPO-MEDROL) injection 40 mg      Procedures: Large Joint Inj: R greater trochanter on 03/11/2018 10:06 AM Indications: pain Details: 22 G 1.5 in needle, lateral approach  Arthrogram: No  Medications: 3 mL lidocaine 1 %; 40 mg methylPREDNISolone acetate 40 MG/ML Outcome: tolerated well, no immediate complications Procedure, treatment alternatives, risks and benefits explained, specific risks discussed. Consent was given by the patient. Immediately prior to procedure a time out was called to verify the correct patient, procedure, equipment, support staff and site/side marked as required. Patient was prepped and draped in the usual sterile fashion.       Clinical Data: No additional findings.   Subjective: Chief Complaint    Patient presents with  . Right Hip - Pain  . Spine - Pain    HPI Micheal Carey is a 68 year old male comes in today due to lower back pain for the past month and a half.  No known injury.  Is having pain that he describes as sharp pulsating pain right buttocks and right hip.  Pain does awaken him.  He denies any fevers ,chills ,shortness of breath, chest pain dysuria, bowel or bladder dysfunction.  Denies any numbness tingling down either leg.  States he had difficulty bearing weight on his right hip due to the pain.  History of right total hip arthroplasty in [place] some 6 to 7 years ago.  Review of Systems  Constitutional: Negative for chills and fever.  Cardiovascular: Negative for chest pain.  Gastrointestinal: Negative for diarrhea.  Genitourinary: Negative for dysuria and flank pain.  Musculoskeletal: Positive for back pain.     Objective: Vital Signs: There were no vitals taken for this visit.  Physical Exam Constitutional:      Appearance: He is normal weight. He is not ill-appearing or diaphoretic.  Cardiovascular:     Pulses: Normal pulses.  Pulmonary:     Effort: Pulmonary effort is normal.  Neurological:     Mental Status: He is oriented to person, place, and time.  Psychiatric:        Mood and Affect: Mood normal.        Behavior: Behavior  normal.     Ortho Exam Lower extremities 5 out of 5 strength throughout.  Negative straight leg raise bilaterally exquisitely tight hamstrings bilaterally.  He has good extension lumbar spine without pain forward flexion he is comes within 6 inches of touching his toes.  Deep tendon reflexes are 2+ knees and ankles symmetric and 2+ at the ankles and equal symmetric.  Sensation grossly intact bilateral feet dorsal pedal pulses are intact bilaterally.  Good range of motion of both hips.  However with external rotation of the right hip he has pain lateral aspect.  He has tenderness over the left trochanteric region.  Nontender and  paraspinous region in the spinal column lumbar spine.  Well-healed surgical scar lateral aspect of the right hip.  Specialty Comments:  No specialty comments available.  Imaging: Lumbar spine AP lateral views: Endplate spurring lower lumbar spine.  Disc space well maintained.  Lack of lordotic curvature.  No spondylolisthesis.  No acute fractures  AP hip lateral view of the right hip.  Status post right total hip arthroplasty.  Components are well-seated.  No evidence of hardware failure.  No acute fractures no bony abnormalities.  PMFS History: Patient Active Problem List   Diagnosis Date Noted  . Trochanteric bursitis, right hip 08/27/2017  . History of total hip arthroplasty, right 08/27/2017  . COPD (chronic obstructive pulmonary disease) (HCC) 12/03/2015  . Hypoglycemia 12/03/2015  . Sepsis (HCC) 12/03/2015  . Intractable vomiting with nausea 10/15/2015  . Transaminasemia 10/15/2015  . Diarrhea 10/15/2015  . Lactic acidosis 10/14/2015  . AKI (acute kidney injury) (HCC) 10/14/2015  . Alcohol abuse 10/14/2015  . COPD with acute exacerbation (HCC) 10/14/2015  . CAP (community acquired pneumonia) 10/14/2015  . Atypical lymphocytes present on peripheral blood smear   . Dehydration    Past Medical History:  Diagnosis Date  . CAP (community acquired pneumonia) 10/14/2015  . Pneumonia    "twice before" (10/14/2015)    Family History  Problem Relation Age of Onset  . Diabetes Mother   . Cancer Mother   . Diabetes Father   . Stroke Neg Hx   . CAD Neg Hx     Past Surgical History:  Procedure Laterality Date  . JOINT REPLACEMENT    . TOTAL HIP ARTHROPLASTY Right    Social History   Occupational History  . Not on file  Tobacco Use  . Smoking status: Former Games developermoker  . Smokeless tobacco: Never Used  Substance and Sexual Activity  . Alcohol use: Yes    Comment: every day beer 4-5 cans   . Drug use: Not on file  . Sexual activity: Not on file

## 2018-03-20 ENCOUNTER — Encounter: Payer: Self-pay | Admitting: Physical Therapy

## 2018-03-20 ENCOUNTER — Ambulatory Visit: Payer: Medicare Other | Attending: Orthopaedic Surgery | Admitting: Physical Therapy

## 2018-03-20 DIAGNOSIS — G8929 Other chronic pain: Secondary | ICD-10-CM | POA: Insufficient documentation

## 2018-03-20 DIAGNOSIS — M545 Low back pain, unspecified: Secondary | ICD-10-CM

## 2018-03-20 DIAGNOSIS — M25551 Pain in right hip: Secondary | ICD-10-CM | POA: Diagnosis not present

## 2018-03-20 NOTE — Therapy (Signed)
[facility] [address] [place], [place], [address] Phone: [phone]   Fax:  [phone]  Physical Therapy Evaluation  Patient Details  Name: Micheal Carey MRN: [id number] Date of Birth: 12/05/1950 Referring Provider (PT): Kathryne Hitch, MD   Encounter Date: 03/20/2018  PT End of Session - 03/20/18 1208    Visit Number  1    Number of Visits  12    Date for PT Re-Evaluation  05/01/18    Authorization Type  [employer] MCR    PT Start Time  1100    PT Stop Time  1145    PT Time Calculation (min)  45 min    Activity Tolerance  Patient tolerated treatment well    Behavior During Therapy  [facility] for tasks assessed/performed       Past Medical History:  Diagnosis Date  . CAP (community acquired pneumonia) 10/14/2015  . Pneumonia    "twice before" (10/14/2015)    Past Surgical History:  Procedure Laterality Date  . JOINT REPLACEMENT    . TOTAL HIP ARTHROPLASTY Right     There were no vitals filed for this visit.   Subjective Assessment - 03/20/18 1142    Subjective  Pt relays Rt hip pain and back pain for last 7 years after he had hip replacement 7 years ago. He recently saw ortho and was diagnosed with Rt hip bursitis and tendonitis and recieved injection which helped a little bit. He had recent imaging showing Imaging: hip XR: "Status post right total hip arthroplasty. Components are well-seated. No evidence of hardware failure. No acute fractures no bony abnormalities. Lumbar XR: Endplate spurring lower lumbar spine. Disc space well maintained. Lack of lordotic curvature. No spondylolisthesis.  He denies and N/T in Rt leg and denies any B/B symptoms.   How long can you sit comfortably?  30 min    How long can you stand comfortably?  30 min to one hour    How long can you walk comfortably?  45 min    Diagnostic tests  Imaging: hip XR: "Status post right total hip arthroplasty. Components are well-seated. No  evidence of hardware failure. No acute fractures no bony abnormalities. Lumbar XR: Endplate spurring lower lumbar spine. Disc     Patient Stated Goals  be more activie    Currently in Pain?  Yes    Pain Score  9     Pain Location  Hip   and back   Pain Orientation  Right    Pain Descriptors / Indicators  Aching;Sharp    Pain Type  Chronic pain    Pain Radiating Towards  Down to Rt knee, does not go past knee    Pain Onset  More than a month ago    Pain Frequency  Intermittent    Aggravating Factors   any activity of if he sits to long    Pain Relieving Factors  meds, some relief aftr injection    Effect of Pain on Daily Activities  limit ADL's, cant bend over to pick something up    Multiple Pain Sites  Yes   back 9/10 pain        [facility] PT Assessment - 03/20/18 0001      Assessment   Medical Diagnosis  Pain in Rt hip, Chronic Rt LBP    Referring Provider (PT)  Kathryne Hitch, MD    Onset Date/Surgical Date  --   Rt THA 7 years ago   Hand Dominance  Right      Precautions   Precautions  None      Restrictions   Weight Bearing Restrictions  No      Balance Screen   Has the patient fallen in the past 6 months  Yes    How many times?  1   Rt leg gave out   Has the patient had a decrease in activity level because of a fear of falling?   No    Is the patient reluctant to leave their home because of a fear of falling?   No      Home Public house manager residence    Additional Comments  one level, no steps or stairs      Prior Function   Level of Independence  Independent      Cognition   Overall Cognitive Status  Within Functional Limits for tasks assessed      Observation/Other Assessments   Focus on Therapeutic Outcomes (FOTO)   was not set up for him      Sensation   Light Touch  Appears Intact      Coordination   Gross Motor Movements are Fluid and Coordinated  Yes      Posture/Postural Control   Posture Comments  lack of  lumbar lordosis      ROM / Strength   AROM / PROM / Strength  AROM;Strength      AROM   AROM Assessment Site  Lumbar;Hip    Right/Left Hip  Right    Right Hip Flexion  95    Right Hip External Rotation   15    Right Hip Internal Rotation   15    Right Hip ABduction  11    Lumbar Flexion  75%    Lumbar Extension  50%    Lumbar - Right Side Bend  50%    Lumbar - Left Side Bend  50%    Lumbar - Right Rotation  75%    Lumbar - Left Rotation  75%      Strength   Overall Strength Comments  Lt leg 5/5 MMT grossly, Rt leg 4/5 MMT grossly tested in sitting      Flexibility   Soft Tissue Assessment /Muscle Length  --   very tight in Rt lumbar P.S, glutes, IT band-TFL, and H.S.     Palpation   Palpation comment  TTP Rt lumbar P.S, over greater trochanter and down IT band      Special Tests   Other special tests  +SLR test, +quadrant test, +OBERS test      Ambulation/Gait   Gait Comments  slight antalgic gait on Rt with decresaed stance time and step length                 Objective measurements completed on examination: See above findings.      [facility] Adult PT Treatment/Exercise - 03/20/18 0001      Modalities   Modalities  Cryotherapy;Electrical Stimulation;Moist Heat      Moist Heat Therapy   Number Minutes Moist Heat  15 Minutes    Moist Heat Location  Lumbar Spine      Cryotherapy   Number Minutes Cryotherapy  15 Minutes    Cryotherapy Location  Hip    Type of Cryotherapy  Ice pack      Electrical Stimulation   Electrical Stimulation Location  Rt lumbar and hip    Electrical Stimulation Action  IFC  Electrical Stimulation Parameters  to tolerance, pt SL on Lt    Electrical Stimulation Goals  Tone;Pain             PT Education - 03/20/18 1208    Education Details  HEP, POC, TENS    Person(s) Educated  Patient    Methods  Explanation;Demonstration;Verbal cues;Handout    Comprehension  Verbalized understanding;Need further instruction           PT Long Term Goals - 03/20/18 1314      PT LONG TERM GOAL #1   Title  Pt will be I and compliant with HEP. (6 weeks 05/01/18)    Status  New      PT LONG TERM GOAL #2   Title  Pt will improve Rt LE strength to at least 4+/5MMT grossly tested in sitting    Status  New      PT LONG TERM GOAL #3   Title  Pt will report overall pain less than 5/10 to improve activity tolerace. (6 weeks 05/01/18)      PT LONG TERM GOAL #4   Title  Pt will improve lumbar and Rt hip ROM to [facility]. (6 weeks 05/01/18)    Status  New             Plan - 03/20/18 1211    Clinical Impression Statement  Pt presents with chronic Rt hip pain, low back pain and stiffness. He had Rt TKA 7 years ago due to OA and has pain in Rt hip and back since then. He was severe tightness and restrictions in Rt lumbar P.S, glutes, and IT band. He has decresaed hip and lumbar ROM, decreased Rt LE strength, decreased actiivity tolerance and increased pain. He will benefit from skilled PT to adress his deficits. He did express some pain relief after TENS with heat to back and ice to hip today.    History and Personal Factors relevant to plan of care:  PMH:Rt THA (6-7 years ago), Rt hip bursitis, COPD,long term opiod use/abuse, alcohol abuse    Clinical Presentation  Evolving    Clinical Presentation due to:  comorbidites, chronic nature of pain, up and down pain levels    Clinical Decision Making  Moderate    Rehab Potential  Good    Clinical Impairments Affecting Rehab Potential  chronic nature of pain    PT Frequency  2x / week    PT Duration  6 weeks    PT Treatment/Interventions  Cryotherapy;Electrical Stimulation;Iontophoresis 4mg /ml Dexamethasone;Moist Heat;Ultrasound;Gait training;Stair training;Therapeutic activities;Therapeutic exercise;Balance training;Neuromuscular re-education;Passive range of motion;Dry needling;Manual techniques;Spinal Manipulations    PT Next Visit Plan  review HEP, needs streching, and hip ROM,  Rt LE strength, modalaties and MT for pain    PT Home Exercise Plan  IT band stretch, HSS,pirformis stretch, bridges,clams, standing hip 3 way, lumbar ext    Consulted and Agree with Plan of Care  Patient       Patient will benefit from skilled therapeutic intervention in order to improve the following deficits and impairments:  Abnormal gait, Decreased activity tolerance, Decreased endurance, Decreased range of motion, Decreased strength, Difficulty walking, Increased fascial restricitons, Increased muscle spasms, Impaired flexibility, Improper body mechanics, Postural dysfunction, Pain  Visit Diagnosis: Pain in right hip  Chronic right-sided low back pain, unspecified whether sciatica present     Problem List Patient Active Problem List   Diagnosis Date Noted  . Trochanteric bursitis, right hip 08/27/2017  . History of total hip arthroplasty, right 08/27/2017  .  COPD (chronic obstructive pulmonary disease) (HCC) 12/03/2015  . Hypoglycemia 12/03/2015  . Sepsis (HCC) 12/03/2015  . Intractable vomiting with nausea 10/15/2015  . Transaminasemia 10/15/2015  . Diarrhea 10/15/2015  . Lactic acidosis 10/14/2015  . AKI (acute kidney injury) (HCC) 10/14/2015  . Alcohol abuse 10/14/2015  . COPD with acute exacerbation (HCC) 10/14/2015  . CAP (community acquired pneumonia) 10/14/2015  . Atypical lymphocytes present on peripheral blood smear   . Dehydration     Birdie RiddleBrian R Alyric Parkin,PT,DPT 03/20/2018, 1:17 PM  [facility] [address] [place], [place], [address] Phone: [phone]   Fax:  [phone]  Name: Micheal Carey MRN: [id number] Date of Birth: Nov 19, 1950

## 2018-03-20 NOTE — Patient Instructions (Addendum)
Access Code: [email or web address]  URL: [email or web address]  Date: 03/20/2018  Prepared by: Ivery Quale   Exercises  Supine ITB Stretch with Strap - 3 reps - 1 sets - 30 hold - 2x daily - 6x weekly  Supine Hamstring Stretch with Strap - 3 reps - 1 sets - 30 hold - 2x daily - 6x weekly  Supine Piriformis Stretch with Foot on Ground - 3 sets - 30 hold - 2x daily - 6x weekly  Supine Bridge - 10 reps - 2 sets - 5 hold - 2x daily - 6x weekly  Clamshell - 10 reps - 3 sets - 2x daily - 6x weekly  Standing Hip Abduction - 10 reps - 1-2 sets - 2x daily - 6x weekly  Standing Marching - 10 reps - 1-3 sets - 2x daily - 6x weekly  Standing Hip Extension with Counter Support - 10 reps - 1-2 sets - 2x daily - 6x weekly  Standing Lumbar Extension - 10 reps - 1-2 sets - 5 hold - 2x daily - 6x weekly   TENS UNIT: This is helpful for muscle pain and spasm.   Search and Purchase a TENS 7000 2nd edition at www.tenspros.com. It should be less than $30.     TENS unit instructions: Do not shower or bathe with the unit on Turn the unit off before removing electrodes or batteries If the electrodes lose stickiness add a drop of water to the electrodes after they are disconnected from the unit and place on plastic sheet. If you continued to have difficulty, call the TENS unit company to purchase more electrodes. Do not apply lotion on the skin area prior to use. Make sure the skin is clean and dry as this will help prolong the life of the electrodes. After use, always check skin for unusual red areas, rash or other skin difficulties. If there are any skin problems, does not apply electrodes to the same area. Never remove the electrodes from the unit by pulling the wires. Do not use the TENS unit or electrodes other than as directed. Do not change electrode placement without consultating your therapist or physician. Keep 2 fingers with between each electrode. Wear time ratio is 2:1, on to off times.     For example on for 30 minutes off for 15 minutes and then on for 30 minutes off for 15 minutes

## 2018-03-29 ENCOUNTER — Ambulatory Visit: Payer: Medicare Other | Admitting: Physical Therapy

## 2018-04-05 ENCOUNTER — Encounter: Payer: Self-pay | Admitting: Physical Therapy

## 2018-04-05 ENCOUNTER — Ambulatory Visit: Payer: Medicare Other | Attending: Orthopaedic Surgery | Admitting: Physical Therapy

## 2018-04-05 DIAGNOSIS — M545 Low back pain, unspecified: Secondary | ICD-10-CM

## 2018-04-05 DIAGNOSIS — M25551 Pain in right hip: Secondary | ICD-10-CM | POA: Diagnosis not present

## 2018-04-05 DIAGNOSIS — G8929 Other chronic pain: Secondary | ICD-10-CM | POA: Diagnosis present

## 2018-04-05 NOTE — Therapy (Signed)
[facility] [address] [place], [place], [address] Phone: [phone]   Fax:  [phone]  Physical Therapy Treatment  Patient Details  Name: Micheal Carey MRN: [id number] Date of Birth: 1950/05/14 Referring Provider (PT): Kathryne Hitch, MD   Encounter Date: 04/05/2018  PT End of Session - 04/05/18 0811    Visit Number  2    Number of Visits  12    Date for PT Re-Evaluation  05/01/18    Authorization Type  [employer] MCR    PT Start Time  0805    PT Stop Time  0905    PT Time Calculation (min)  60 min       Past Medical History:  Diagnosis Date  . CAP (community acquired pneumonia) 10/14/2015  . Pneumonia    "twice before" (10/14/2015)    Past Surgical History:  Procedure Laterality Date  . JOINT REPLACEMENT    . TOTAL HIP ARTHROPLASTY Right     There were no vitals filed for this visit.  Subjective Assessment - 04/05/18 0809    Subjective  Patient reports pain at 10/10 at beginning of session. Recommneded ED visit due to pain level and pt declined. Pain number changed to 7/10 in low back and sides.     Currently in Pain?  Yes    Pain Score  7     Pain Location  Hip   and back    Pain Orientation  Right    Pain Descriptors / Indicators  Aching;Sharp    Pain Radiating Towards  down to knee     Aggravating Factors   mornings are the worst     Pain Relieving Factors  meds , heat                        [facility] Adult PT Treatment/Exercise - 04/05/18 0001      Exercises   Exercises  Knee/Hip;Lumbar      Lumbar Exercises: Stretches   Active Hamstring Stretch  2 reps;30 seconds    Single Knee to Chest Stretch  2 reps;30 seconds    Lower Trunk Rotation  10 seconds    Lower Trunk Rotation Limitations  5 reps each side with opposite head turn     Pelvic Tilt  10 reps    ITB Stretch  2 reps;30 seconds    Piriformis Stretch  2 reps;30 seconds      Lumbar Exercises: Aerobic   Nustep  --       Knee/Hip Exercises: Aerobic   Nustep  Nustep L 4 UE/LE x 5 minutes       Knee/Hip Exercises: Standing   Other Standing Knee Exercises  3 way hip standing x 10 each bilateral       Knee/Hip Exercises: Supine   Bridges  10 reps;2 sets      Moist Heat Therapy   Number Minutes Moist Heat  15 Minutes    Moist Heat Location  Lumbar Spine      Cryotherapy   Number Minutes Cryotherapy  15 Minutes    Cryotherapy Location  Hip    Type of Cryotherapy  Ice pack      Electrical Stimulation   Electrical Stimulation Location  Rt lumbar and hip    Electrical Stimulation Action  IFC    Electrical Stimulation Parameters  pt tolerance, pt SL on left     Electrical Stimulation Goals  Pain  PT Education - 04/05/18 0908    Education Details  HEP    Person(s) Educated  Patient    Methods  Explanation;Handout    Comprehension  Verbalized understanding          PT Long Term Goals - 03/20/18 1314      PT LONG TERM GOAL #1   Title  Pt will be I and compliant with HEP. (6 weeks 05/01/18)    Status  New      PT LONG TERM GOAL #2   Title  Pt will improve Rt LE strength to at least 4+/5MMT grossly tested in sitting    Status  New      PT LONG TERM GOAL #3   Title  Pt will report overall pain less than 5/10 to improve activity tolerace. (6 weeks 05/01/18)      PT LONG TERM GOAL #4   Title  Pt will improve lumbar and Rt hip ROM to [facility]. (6 weeks 05/01/18)    Status  New            Plan - 04/05/18 0824    Clinical Impression Statement  Pt arrives reporting 10/10 pain every morning. He was offered emergency services however he declined and reported pain was actually a 7/10 at the time. He reports TENS helpful last visit and he has requested one from his MD and insurance provider. He reports overall pain is better however pain unchanged in the mornings. We reviewed HEP and he was able to complete all exercises. Instructed pt in lumbar stretches he can perform in bed before rising.   Repeated modaliites per last visit to decrease pain.     PT Next Visit Plan  review HEP, needs streching, and hip ROM, Rt LE strength, modalaties and MT for pain; how is morning pain? Did he try stretching prior to standing?     PT Home Exercise Plan  IT band stretch, HSS,pirformis stretch, bridges,clams, standing hip 3 way, lumbar ext, Morning stretches: SKTC, LTR, pelvic rocking     Consulted and Agree with Plan of Care  Patient       Patient will benefit from skilled therapeutic intervention in order to improve the following deficits and impairments:  Abnormal gait, Decreased activity tolerance, Decreased endurance, Decreased range of motion, Decreased strength, Difficulty walking, Increased fascial restricitons, Increased muscle spasms, Impaired flexibility, Improper body mechanics, Postural dysfunction, Pain  Visit Diagnosis: Pain in right hip  Chronic right-sided low back pain, unspecified whether sciatica present     Problem List Patient Active Problem List   Diagnosis Date Noted  . Trochanteric bursitis, right hip 08/27/2017  . History of total hip arthroplasty, right 08/27/2017  . COPD (chronic obstructive pulmonary disease) (HCC) 12/03/2015  . Hypoglycemia 12/03/2015  . Sepsis (HCC) 12/03/2015  . Intractable vomiting with nausea 10/15/2015  . Transaminasemia 10/15/2015  . Diarrhea 10/15/2015  . Lactic acidosis 10/14/2015  . AKI (acute kidney injury) (HCC) 10/14/2015  . Alcohol abuse 10/14/2015  . COPD with acute exacerbation (HCC) 10/14/2015  . CAP (community acquired pneumonia) 10/14/2015  . Atypical lymphocytes present on peripheral blood smear   . Dehydration     Sherrie Mustache, PTA 04/05/2018, 9:10 AM  [facility] [address] [place], [place], [address] Phone: [phone]   Fax:  [phone]  Name: Micheal Carey MRN: [id number] Date of Birth: 02/07/1950

## 2018-04-05 NOTE — Patient Instructions (Signed)
Access Code: [email or web address]  URL: [email or web address]  Date: 04/05/2018  Prepared by: Jannette Spanner   Exercises  Supine Lower Trunk Rotation - 10 reps - 1 sets - 10 hold - 2x daily - 7x weekly  Hooklying Single Knee to Chest Stretch - 3 reps - 2 sets - 30 hold - 2x daily - 7x weekly  Supine Pelvic Rocking - 10 reps - 2 sets - 2x daily - 7x weekly

## 2018-04-08 ENCOUNTER — Ambulatory Visit (INDEPENDENT_AMBULATORY_CARE_PROVIDER_SITE_OTHER): Payer: Medicare Other | Admitting: Physician Assistant

## 2018-04-08 ENCOUNTER — Encounter (INDEPENDENT_AMBULATORY_CARE_PROVIDER_SITE_OTHER): Payer: Self-pay | Admitting: Physician Assistant

## 2018-04-08 DIAGNOSIS — M545 Low back pain, unspecified: Secondary | ICD-10-CM

## 2018-04-08 DIAGNOSIS — M7061 Trochanteric bursitis, right hip: Secondary | ICD-10-CM

## 2018-04-08 NOTE — Progress Notes (Signed)
HPI: Mr. Meader returns today for follow-up of his low back pain and right hip pain.  He states that he feels that shot helped somewhat the hip pain but mostly that therapy is helped with his back and hip pain.  He is having no radicular symptoms down either leg.  He is going to therapy once weekly.  He is working on home exercise program.  He is mostly stiff in the morning and feels better throughout the day.  Review of systems see HPI otherwise negative  Physical exam: General well-developed well-nourished male in no acute distress. Psych: Alert and oriented x3 Lower extremities: Good range of motion bilateral hips without pain.  Tenderness over the trochanteric region both hips right greater than left.  Tight hamstrings bilaterally. Lumbar spine: Limited flexion and extension without pain.  Negative straight leg raise bilaterally.  Impression: Right hip trochanteric Bursitis Low Back pain  Plan: He will continue to work with physical therapy on back strengthening, hamstring stretching and IT band stretching.  Discontinued therapy once he is a good home exercise program.  Follow-up with [place] on as-needed basis.  Questions encouraged and answered at length.

## 2018-04-12 ENCOUNTER — Ambulatory Visit: Payer: Medicare Other | Admitting: Physical Therapy

## 2018-04-12 ENCOUNTER — Other Ambulatory Visit: Payer: Self-pay

## 2018-04-12 DIAGNOSIS — M545 Low back pain: Secondary | ICD-10-CM

## 2018-04-12 DIAGNOSIS — M25551 Pain in right hip: Secondary | ICD-10-CM | POA: Diagnosis not present

## 2018-04-12 DIAGNOSIS — G8929 Other chronic pain: Secondary | ICD-10-CM

## 2018-04-12 NOTE — Therapy (Addendum)
[facility] [address] [place], [place], [address] Phone: [phone]   Fax:  [phone]  Physical Therapy Treatment/Discharge  Patient Details  Name: Micheal Carey MRN: [id number] Date of Birth: 01/27/1951 Referring Provider (PT): Blackman, Christopher Y, MD   Encounter Date: 04/12/2018  PT End of Session - 04/12/18 0908    Visit Number  3    Number of Visits  12    Date for PT Re-Evaluation  05/01/18    Authorization Type  [employer] MCR    PT Start Time  0805    PT Stop Time  0900    PT Time Calculation (min)  55 min    Activity Tolerance  Patient tolerated treatment well    Behavior During Therapy  WFL for tasks assessed/performed       Past Medical History:  Diagnosis Date  . CAP (community acquired pneumonia) 10/14/2015  . Pneumonia    "twice before" (10/14/2015)    Past Surgical History:  Procedure Laterality Date  . JOINT REPLACEMENT    . TOTAL HIP ARTHROPLASTY Right     There were no vitals filed for this visit.  Subjective Assessment - 04/12/18 0842    Subjective  Pt reports the pain is a lot better and he is walking better, he still reports 6/10 pain but this is a lot better for him compared to the 10/10 pain he is used to being in. He says the stretching in the morning is helping the morning pain some    Diagnostic tests  Imaging: hip XR: "Status post right total hip arthroplasty. Components are well-seated. No evidence of hardware failure. No acute fractures no bony abnormalities. Lumbar XR: Endplate spurring lower lumbar spine. Disc     Currently in Pain?  Yes    Pain Score  6     Pain Location  Hip    Pain Orientation  Right    Pain Descriptors / Indicators  Aching;Sharp    Pain Type  Chronic pain    Pain Onset  More than a month ago    Pain Frequency  Intermittent                       [facility] Adult PT Treatment/Exercise - 04/12/18 0001      Lumbar Exercises: Stretches   Active  Hamstring Stretch  2 reps;30 seconds    Single Knee to Chest Stretch  2 reps;30 seconds    Lower Trunk Rotation  10 seconds    Lower Trunk Rotation Limitations  5 reps each side with opposite head turn     ITB Stretch  2 reps;30 seconds    Piriformis Stretch  2 reps;30 seconds      Lumbar Exercises: Aerobic   Recumbent Bike  L1 X 6 min      Lumbar Exercises: Standing   Other Standing Lumbar Exercises  sidestepping with red band around knees at counter up/down X 3      Lumbar Exercises: Seated   Sit to Stand Limitations  with red band around knees X 10, no UE       Knee/Hip Exercises: Supine   Bridges Limitations  15 reps with clam red band      Knee/Hip Exercises: Sidelying   Clams  laying on Lt side red band X 2X10      Moist Heat Therapy   Number Minutes Moist Heat  15 Minutes    Moist Heat Location  Lumbar Spine        Cryotherapy   Number Minutes Cryotherapy  15 Minutes    Cryotherapy Location  Hip    Type of Cryotherapy  Ice pack      Electrical Stimulation   Electrical Stimulation Location  Rt lumbar and hip    Electrical Stimulation Action  IFC    Electrical Stimulation Parameters  tolerance    Electrical Stimulation Goals  Pain      Manual Therapy   Manual therapy comments  Micheal hip PROM flexion and IR, interspaced with LAD                  PT Long Term Goals - 03/20/18 1314      PT LONG TERM GOAL #1   Title  Pt will be I and compliant with HEP. (6 weeks 05/01/18)    Status  New      PT LONG TERM GOAL #2   Title  Pt will improve Rt LE strength to at least 4+/5MMT grossly tested in sitting    Status  New      PT LONG TERM GOAL #3   Title  Pt will report overall pain less than 5/10 to improve activity tolerace. (6 weeks 05/01/18)      PT LONG TERM GOAL #4   Title  Pt will improve lumbar and Rt hip ROM to WFL. (6 weeks 05/01/18)    Status  New            Plan - 04/12/18 0911    Clinical Impression Statement  Pt had overall less pain today  and improved activity tolerance. He seemed to benefit from some MT and then was able to load his Rt hip more today with strength program with good tolerance. Session ended with MHP and TENS as he continues to relay pain relief with this.     Rehab Potential  Good    Clinical Impairments Affecting Rehab Potential  chronic nature of pain    PT Frequency  2x / week    PT Duration  6 weeks    PT Treatment/Interventions  Cryotherapy;Electrical Stimulation;Iontophoresis 4mg/ml Dexamethasone;Moist Heat;Ultrasound;Gait training;Stair training;Therapeutic activities;Therapeutic exercise;Balance training;Neuromuscular re-education;Passive range of motion;Dry needling;Manual techniques;Spinal Manipulations    PT Next Visit Plan  progress as able, needs streching, and hip ROM, Rt LE strength, modalaties and MT for pain; how is morning pain? Did he try stretching prior to standing?     PT Home Exercise Plan  IT band stretch, HSS,pirformis stretch, bridges,clams, standing hip 3 way, lumbar ext, Morning stretches: SKTC, LTR, pelvic rocking     Consulted and Agree with Plan of Care  Patient       Patient will benefit from skilled therapeutic intervention in order to improve the following deficits and impairments:  Abnormal gait, Decreased activity tolerance, Decreased endurance, Decreased range of motion, Decreased strength, Difficulty walking, Increased fascial restricitons, Increased muscle spasms, Impaired flexibility, Improper body mechanics, Postural dysfunction, Pain  Visit Diagnosis: Pain in right hip  Chronic right-sided low back pain, unspecified whether sciatica present     Problem List Patient Active Problem List   Diagnosis Date Noted  . Trochanteric bursitis, right hip 08/27/2017  . History of total hip arthroplasty, right 08/27/2017  . COPD (chronic obstructive pulmonary disease) (HCC) 12/03/2015  . Hypoglycemia 12/03/2015  . Sepsis (HCC) 12/03/2015  . Intractable vomiting with nausea  10/15/2015  . Transaminasemia 10/15/2015  . Diarrhea 10/15/2015  . Lactic acidosis 10/14/2015  . AKI (acute kidney injury) (HCC) 10/14/2015  . Alcohol abuse 10/14/2015  .   COPD with acute exacerbation ([place]) 10/14/2015  . CAP (community acquired pneumonia) 10/14/2015  . Atypical lymphocytes present on peripheral blood smear   . Dehydration     Silvestre Mesi 04/12/2018, 9:15 AM  [facility] [address] [place], [place], [address] Phone: [phone]   Fax:  [phone]  Name: Micheal Carey MRN: [id number] Date of Birth: 01-19-1951  PHYSICAL THERAPY DISCHARGE SUMMARY  Visits from Start of Care: 3  Current functional level related to goals / functional outcomes: See above   Remaining deficits: See above   Education / Equipment: Anatomy of condition, POC, HEP, exercise form/rationale  Plan: Patient agrees to discharge.  Patient goals were not met. Patient is being discharged due to                                                     ?????    Does not feel safe returning to clinic at this time due to COVID-19. He will obtain a new referral when he is ready to return.  Jessica C. Hightower PT, DPT 06/05/18 1:33 PM

## 2018-04-19 ENCOUNTER — Ambulatory Visit: Payer: Medicare Other | Admitting: Physical Therapy

## 2018-04-26 ENCOUNTER — Ambulatory Visit: Payer: Medicare Other | Admitting: Physical Therapy

## 2018-05-03 ENCOUNTER — Ambulatory Visit: Payer: Medicare Other | Admitting: Physical Therapy

## 2018-05-09 ENCOUNTER — Telehealth: Payer: Self-pay | Admitting: Physical Therapy

## 2018-05-09 NOTE — Telephone Encounter (Signed)
He was contacted today regarding temporary reduction of Outpatient Rehabilitation Services at [facility] due to concerns for community transmission of COVID-19.  Patient identity was verified.  Assessed if patient needed to be seen in person by clinician (recent fall or acute injury that requires hands on assessment and advice, change in diet order, post-surgical, special cases, etc.).    Patient did not have an acute/special need that requires in person visit. Proceeded with phone call.  Therapist advised the patient to continue to perform his/her HEP and assured he/she had no unanswered questions or concerns at this time.   The patient was offered and declined the continuation of their plan of care by using methods such as an E-Visit, virtual check in, or Telehealth visit.  Outpatient Rehabilitation Services at [facility] will follow up with this client when we are able to safely resume care at the clinic to all populations.    Patient is aware we can be reached by telephone during limited business hours in the meantime.   Ivery Quale, PT, DPT 05/09/18 12:56 PM

## 2018-06-05 ENCOUNTER — Telehealth: Payer: Self-pay | Admitting: Physical Therapy

## 2018-06-05 NOTE — Telephone Encounter (Signed)
Spoke with patient regarding availability of appointments in clinic. States he would like to hold on PT and I advised that he will be d/c and will need a new referral when he feels safe returning. Pt verbalized agreement. Kyreese Chio C. Norvin Ohlin PT, DPT 06/05/18 1:32 PM

## 2018-09-08 ENCOUNTER — Inpatient Hospital Stay (HOSPITAL_COMMUNITY)
Admission: EM | Admit: 2018-09-08 | Discharge: 2018-11-09 | DRG: 004 | Disposition: A | Discharge status: Rehabilitation Facility | Payer: Medicare Other | Attending: Internal Medicine | Admitting: Internal Medicine

## 2018-09-08 ENCOUNTER — Encounter (HOSPITAL_COMMUNITY): Payer: Self-pay | Admitting: Emergency Medicine

## 2018-09-08 ENCOUNTER — Inpatient Hospital Stay (HOSPITAL_COMMUNITY): Payer: Medicare Other

## 2018-09-08 ENCOUNTER — Emergency Department (HOSPITAL_COMMUNITY): Payer: Medicare Other

## 2018-09-08 DIAGNOSIS — J189 Pneumonia, unspecified organism: Secondary | ICD-10-CM | POA: Diagnosis not present

## 2018-09-08 DIAGNOSIS — N179 Acute kidney failure, unspecified: Secondary | ICD-10-CM | POA: Diagnosis not present

## 2018-09-08 DIAGNOSIS — L89151 Pressure ulcer of sacral region, stage 1: Secondary | ICD-10-CM | POA: Diagnosis not present

## 2018-09-08 DIAGNOSIS — Z515 Encounter for palliative care: Secondary | ICD-10-CM | POA: Diagnosis not present

## 2018-09-08 DIAGNOSIS — Z87891 Personal history of nicotine dependence: Secondary | ICD-10-CM

## 2018-09-08 DIAGNOSIS — R402232 Coma scale, best verbal response, inappropriate words, at arrival to emergency department: Secondary | ICD-10-CM | POA: Diagnosis present

## 2018-09-08 DIAGNOSIS — L8990 Pressure ulcer of unspecified site, unspecified stage: Secondary | ICD-10-CM | POA: Diagnosis not present

## 2018-09-08 DIAGNOSIS — I10 Essential (primary) hypertension: Secondary | ICD-10-CM | POA: Diagnosis present

## 2018-09-08 DIAGNOSIS — Z930 Tracheostomy status: Secondary | ICD-10-CM

## 2018-09-08 DIAGNOSIS — L89626 Pressure-induced deep tissue damage of left heel: Secondary | ICD-10-CM | POA: Diagnosis not present

## 2018-09-08 DIAGNOSIS — G931 Anoxic brain damage, not elsewhere classified: Secondary | ICD-10-CM | POA: Diagnosis present

## 2018-09-08 DIAGNOSIS — Z789 Other specified health status: Secondary | ICD-10-CM

## 2018-09-08 DIAGNOSIS — Z9289 Personal history of other medical treatment: Secondary | ICD-10-CM

## 2018-09-08 DIAGNOSIS — J9600 Acute respiratory failure, unspecified whether with hypoxia or hypercapnia: Secondary | ICD-10-CM | POA: Diagnosis not present

## 2018-09-08 DIAGNOSIS — Z79899 Other long term (current) drug therapy: Secondary | ICD-10-CM

## 2018-09-08 DIAGNOSIS — J13 Pneumonia due to Streptococcus pneumoniae: Secondary | ICD-10-CM | POA: Diagnosis not present

## 2018-09-08 DIAGNOSIS — E871 Hypo-osmolality and hyponatremia: Secondary | ICD-10-CM | POA: Diagnosis not present

## 2018-09-08 DIAGNOSIS — N390 Urinary tract infection, site not specified: Secondary | ICD-10-CM | POA: Diagnosis not present

## 2018-09-08 DIAGNOSIS — T50901A Poisoning by unspecified drugs, medicaments and biological substances, accidental (unintentional), initial encounter: Secondary | ICD-10-CM | POA: Diagnosis present

## 2018-09-08 DIAGNOSIS — R402112 Coma scale, eyes open, never, at arrival to emergency department: Secondary | ICD-10-CM | POA: Diagnosis present

## 2018-09-08 DIAGNOSIS — Z23 Encounter for immunization: Secondary | ICD-10-CM

## 2018-09-08 DIAGNOSIS — R627 Adult failure to thrive: Secondary | ICD-10-CM | POA: Diagnosis present

## 2018-09-08 DIAGNOSIS — Y95 Nosocomial condition: Secondary | ICD-10-CM | POA: Diagnosis not present

## 2018-09-08 DIAGNOSIS — I469 Cardiac arrest, cause unspecified: Secondary | ICD-10-CM | POA: Diagnosis present

## 2018-09-08 DIAGNOSIS — A419 Sepsis, unspecified organism: Secondary | ICD-10-CM | POA: Diagnosis present

## 2018-09-08 DIAGNOSIS — Y92230 Patient room in hospital as the place of occurrence of the external cause: Secondary | ICD-10-CM | POA: Diagnosis not present

## 2018-09-08 DIAGNOSIS — R0902 Hypoxemia: Secondary | ICD-10-CM

## 2018-09-08 DIAGNOSIS — Z978 Presence of other specified devices: Secondary | ICD-10-CM

## 2018-09-08 DIAGNOSIS — R1310 Dysphagia, unspecified: Secondary | ICD-10-CM | POA: Diagnosis not present

## 2018-09-08 DIAGNOSIS — Y92481 Parking lot as the place of occurrence of the external cause: Secondary | ICD-10-CM | POA: Diagnosis not present

## 2018-09-08 DIAGNOSIS — F1020 Alcohol dependence, uncomplicated: Secondary | ICD-10-CM | POA: Diagnosis present

## 2018-09-08 DIAGNOSIS — Z9911 Dependence on respirator [ventilator] status: Secondary | ICD-10-CM

## 2018-09-08 DIAGNOSIS — R402342 Coma scale, best motor response, flexion withdrawal, at arrival to emergency department: Secondary | ICD-10-CM | POA: Diagnosis present

## 2018-09-08 DIAGNOSIS — R403 Persistent vegetative state: Secondary | ICD-10-CM | POA: Diagnosis present

## 2018-09-08 DIAGNOSIS — J410 Simple chronic bronchitis: Secondary | ICD-10-CM | POA: Diagnosis not present

## 2018-09-08 DIAGNOSIS — Z8701 Personal history of pneumonia (recurrent): Secondary | ICD-10-CM

## 2018-09-08 DIAGNOSIS — J9601 Acute respiratory failure with hypoxia: Secondary | ICD-10-CM | POA: Diagnosis present

## 2018-09-08 DIAGNOSIS — Z7289 Other problems related to lifestyle: Secondary | ICD-10-CM

## 2018-09-08 DIAGNOSIS — T401X1A Poisoning by heroin, accidental (unintentional), initial encounter: Principal | ICD-10-CM | POA: Diagnosis present

## 2018-09-08 DIAGNOSIS — Z7189 Other specified counseling: Secondary | ICD-10-CM | POA: Diagnosis not present

## 2018-09-08 DIAGNOSIS — F1010 Alcohol abuse, uncomplicated: Secondary | ICD-10-CM | POA: Diagnosis present

## 2018-09-08 DIAGNOSIS — J441 Chronic obstructive pulmonary disease with (acute) exacerbation: Secondary | ICD-10-CM | POA: Diagnosis present

## 2018-09-08 DIAGNOSIS — E876 Hypokalemia: Secondary | ICD-10-CM | POA: Diagnosis not present

## 2018-09-08 DIAGNOSIS — J440 Chronic obstructive pulmonary disease with acute lower respiratory infection: Secondary | ICD-10-CM | POA: Diagnosis present

## 2018-09-08 DIAGNOSIS — J9621 Acute and chronic respiratory failure with hypoxia: Secondary | ICD-10-CM | POA: Diagnosis present

## 2018-09-08 DIAGNOSIS — Y908 Blood alcohol level of 240 mg/100 ml or more: Secondary | ICD-10-CM | POA: Diagnosis present

## 2018-09-08 DIAGNOSIS — F1120 Opioid dependence, uncomplicated: Secondary | ICD-10-CM | POA: Diagnosis present

## 2018-09-08 DIAGNOSIS — Z96641 Presence of right artificial hip joint: Secondary | ICD-10-CM | POA: Diagnosis present

## 2018-09-08 DIAGNOSIS — E440 Moderate protein-calorie malnutrition: Secondary | ICD-10-CM | POA: Diagnosis not present

## 2018-09-08 DIAGNOSIS — X58XXXA Exposure to other specified factors, initial encounter: Secondary | ICD-10-CM | POA: Diagnosis not present

## 2018-09-08 DIAGNOSIS — J158 Pneumonia due to other specified bacteria: Secondary | ICD-10-CM | POA: Diagnosis not present

## 2018-09-08 DIAGNOSIS — R509 Fever, unspecified: Secondary | ICD-10-CM | POA: Diagnosis not present

## 2018-09-08 DIAGNOSIS — R197 Diarrhea, unspecified: Secondary | ICD-10-CM | POA: Diagnosis not present

## 2018-09-08 DIAGNOSIS — I468 Cardiac arrest due to other underlying condition: Secondary | ICD-10-CM | POA: Diagnosis present

## 2018-09-08 DIAGNOSIS — T510X1A Toxic effect of ethanol, accidental (unintentional), initial encounter: Secondary | ICD-10-CM | POA: Diagnosis present

## 2018-09-08 DIAGNOSIS — J449 Chronic obstructive pulmonary disease, unspecified: Secondary | ICD-10-CM | POA: Diagnosis present

## 2018-09-08 DIAGNOSIS — B379 Candidiasis, unspecified: Secondary | ICD-10-CM | POA: Diagnosis not present

## 2018-09-08 DIAGNOSIS — S20421A Blister (nonthermal) of right back wall of thorax, initial encounter: Secondary | ICD-10-CM | POA: Diagnosis not present

## 2018-09-08 DIAGNOSIS — J9602 Acute respiratory failure with hypercapnia: Secondary | ICD-10-CM | POA: Diagnosis not present

## 2018-09-08 DIAGNOSIS — Z7401 Bed confinement status: Secondary | ICD-10-CM

## 2018-09-08 DIAGNOSIS — R64 Cachexia: Secondary | ICD-10-CM | POA: Diagnosis not present

## 2018-09-08 DIAGNOSIS — Z20828 Contact with and (suspected) exposure to other viral communicable diseases: Secondary | ICD-10-CM | POA: Diagnosis present

## 2018-09-08 DIAGNOSIS — Z6822 Body mass index (BMI) 22.0-22.9, adult: Secondary | ICD-10-CM

## 2018-09-08 DIAGNOSIS — L89622 Pressure ulcer of left heel, stage 2: Secondary | ICD-10-CM | POA: Diagnosis not present

## 2018-09-08 DIAGNOSIS — G8929 Other chronic pain: Secondary | ICD-10-CM | POA: Diagnosis present

## 2018-09-08 DIAGNOSIS — R1110 Vomiting, unspecified: Secondary | ICD-10-CM | POA: Diagnosis not present

## 2018-09-08 DIAGNOSIS — Z0189 Encounter for other specified special examinations: Secondary | ICD-10-CM

## 2018-09-08 DIAGNOSIS — J988 Other specified respiratory disorders: Secondary | ICD-10-CM | POA: Diagnosis not present

## 2018-09-08 DIAGNOSIS — E870 Hyperosmolality and hypernatremia: Secondary | ICD-10-CM | POA: Diagnosis not present

## 2018-09-08 DIAGNOSIS — R112 Nausea with vomiting, unspecified: Secondary | ICD-10-CM

## 2018-09-08 DIAGNOSIS — Z7951 Long term (current) use of inhaled steroids: Secondary | ICD-10-CM

## 2018-09-08 DIAGNOSIS — T5191XA Toxic effect of unspecified alcohol, accidental (unintentional), initial encounter: Secondary | ICD-10-CM | POA: Diagnosis present

## 2018-09-08 DIAGNOSIS — J690 Pneumonitis due to inhalation of food and vomit: Secondary | ICD-10-CM | POA: Diagnosis not present

## 2018-09-08 DIAGNOSIS — I340 Nonrheumatic mitral (valve) insufficiency: Secondary | ICD-10-CM | POA: Diagnosis not present

## 2018-09-08 HISTORY — DX: Opioid abuse, uncomplicated: F11.10

## 2018-09-08 HISTORY — DX: Alcohol abuse, uncomplicated: F10.10

## 2018-09-08 HISTORY — DX: Cardiac arrest, cause unspecified: I46.9

## 2018-09-08 LAB — CBC WITH DIFFERENTIAL/PLATELET
Abs Immature Granulocytes: 0.21 10*3/uL — ABNORMAL HIGH (ref 0.00–0.07)
Abs Immature Granulocytes: 0.04 10*3/uL (ref 0.00–0.07)
Basophils Absolute: 0.1 10*3/uL (ref 0.0–0.1)
Basophils Absolute: 0.1 10*3/uL (ref 0.0–0.1)
Basophils Relative: 1 %
Basophils Relative: 1 %
Eosinophils Absolute: 0.4 10*3/uL (ref 0.0–0.5)
Eosinophils Absolute: 0.1 10*3/uL (ref 0.0–0.5)
Eosinophils Relative: 4 %
Eosinophils Relative: 1 %
HCT: 39.4 % (ref 39.0–52.0)
HCT: 36.5 % — ABNORMAL LOW (ref 39.0–52.0)
Hemoglobin: 13.1 g/dL (ref 13.0–17.0)
Hemoglobin: 12.3 g/dL — ABNORMAL LOW (ref 13.0–17.0)
Immature Granulocytes: 2 %
Immature Granulocytes: 0 %
Lymphocytes Relative: 30 %
Lymphocytes Relative: 11 %
Lymphs Abs: 3.2 10*3/uL (ref 0.7–4.0)
Lymphs Abs: 1.3 10*3/uL (ref 0.7–4.0)
MCH: 28.7 pg (ref 26.0–34.0)
MCH: 28.8 pg (ref 26.0–34.0)
MCHC: 33.2 g/dL (ref 30.0–36.0)
MCHC: 33.7 g/dL (ref 30.0–36.0)
MCV: 86.2 fL (ref 80.0–100.0)
MCV: 85.5 fL (ref 80.0–100.0)
Monocytes Absolute: 0.8 10*3/uL (ref 0.1–1.0)
Monocytes Absolute: 1.7 10*3/uL — ABNORMAL HIGH (ref 0.1–1.0)
Monocytes Relative: 8 %
Monocytes Relative: 14 %
Neutro Abs: 5.9 10*3/uL (ref 1.7–7.7)
Neutro Abs: 8.7 10*3/uL — ABNORMAL HIGH (ref 1.7–7.7)
Neutrophils Relative %: 55 %
Neutrophils Relative %: 73 %
Platelets: 261 10*3/uL (ref 150–400)
Platelets: 241 10*3/uL (ref 150–400)
RBC: 4.57 MIL/uL (ref 4.22–5.81)
RBC: 4.27 MIL/uL (ref 4.22–5.81)
RDW: 12.5 % (ref 11.5–15.5)
RDW: 12.5 % (ref 11.5–15.5)
WBC: 10.5 10*3/uL (ref 4.0–10.5)
WBC: 12 10*3/uL — ABNORMAL HIGH (ref 4.0–10.5)
nRBC: 0 % (ref 0.0–0.2)
nRBC: 0 % (ref 0.0–0.2)

## 2018-09-08 LAB — COMPREHENSIVE METABOLIC PANEL
ALT: 33 U/L (ref 0–44)
ALT: 32 U/L (ref 0–44)
AST: 86 U/L — ABNORMAL HIGH (ref 15–41)
AST: 97 U/L — ABNORMAL HIGH (ref 15–41)
Albumin: 3.7 g/dL (ref 3.5–5.0)
Albumin: 3.5 g/dL (ref 3.5–5.0)
Alkaline Phosphatase: 89 U/L (ref 38–126)
Alkaline Phosphatase: 70 U/L (ref 38–126)
Anion gap: 14 (ref 5–15)
Anion gap: 12 (ref 5–15)
BUN: 5 mg/dL — ABNORMAL LOW (ref 8–23)
BUN: 6 mg/dL — ABNORMAL LOW (ref 8–23)
CO2: 20 mmol/L — ABNORMAL LOW (ref 22–32)
CO2: 21 mmol/L — ABNORMAL LOW (ref 22–32)
Calcium: 8.1 mg/dL — ABNORMAL LOW (ref 8.9–10.3)
Calcium: 7.8 mg/dL — ABNORMAL LOW (ref 8.9–10.3)
Chloride: 106 mmol/L (ref 98–111)
Chloride: 108 mmol/L (ref 98–111)
Creatinine, Ser: 1.4 mg/dL — ABNORMAL HIGH (ref 0.61–1.24)
Creatinine, Ser: 1.09 mg/dL (ref 0.61–1.24)
GFR calc Af Amer: >60 mL/min (ref >60)
GFR calc Af Amer: >60 mL/min (ref >60)
GFR calc non Af Amer: 52 mL/min — ABNORMAL LOW (ref >60)
GFR calc non Af Amer: >60 mL/min (ref >60)
Glucose, Bld: 230 mg/dL — ABNORMAL HIGH (ref 70–99)
Glucose, Bld: 134 mg/dL — ABNORMAL HIGH (ref 70–99)
Potassium: 3.1 mmol/L — ABNORMAL LOW (ref 3.5–5.1)
Potassium: 3.3 mmol/L — ABNORMAL LOW (ref 3.5–5.1)
Sodium: 140 mmol/L (ref 135–145)
Sodium: 141 mmol/L (ref 135–145)
Total Bilirubin: 0.4 mg/dL (ref 0.3–1.2)
Total Bilirubin: 1 mg/dL (ref 0.3–1.2)
Total Protein: 7 g/dL (ref 6.5–8.1)
Total Protein: 6.4 g/dL — ABNORMAL LOW (ref 6.5–8.1)

## 2018-09-08 LAB — POCT I-STAT 7, (LYTES, BLD GAS, ICA,H+H)
Acid-base deficit: 1 mmol/L (ref 0.0–2.0)
Acid-base deficit: 3 mmol/L — ABNORMAL HIGH (ref 0.0–2.0)
Bicarbonate: 22.2 mmol/L (ref 20.0–28.0)
Bicarbonate: 21.3 mmol/L (ref 20.0–28.0)
Calcium, Ion: 1.03 mmol/L — ABNORMAL LOW (ref 1.15–1.40)
Calcium, Ion: 1.12 mmol/L — ABNORMAL LOW (ref 1.15–1.40)
HCT: 34 % — ABNORMAL LOW (ref 39.0–52.0)
HCT: 36 % — ABNORMAL LOW (ref 39.0–52.0)
Hemoglobin: 11.6 g/dL — ABNORMAL LOW (ref 13.0–17.0)
Hemoglobin: 12.2 g/dL — ABNORMAL LOW (ref 13.0–17.0)
O2 Saturation: 100 %
O2 Saturation: 95 %
Patient temperature: 98.6
Potassium: 3.1 mmol/L — ABNORMAL LOW (ref 3.5–5.1)
Potassium: 3.3 mmol/L — ABNORMAL LOW (ref 3.5–5.1)
Sodium: 144 mmol/L (ref 135–145)
Sodium: 144 mmol/L (ref 135–145)
TCO2: 23 mmol/L (ref 22–32)
TCO2: 22 mmol/L (ref 22–32)
pCO2 arterial: 30.2 mmHg — ABNORMAL LOW (ref 32.0–48.0)
pCO2 arterial: 34.9 mmHg (ref 32.0–48.0)
pH, Arterial: 7.475 — ABNORMAL HIGH (ref 7.350–7.450)
pH, Arterial: 7.394 (ref 7.350–7.450)
pO2, Arterial: 486 mmHg — ABNORMAL HIGH (ref 83.0–108.0)
pO2, Arterial: 76 mmHg — ABNORMAL LOW (ref 83.0–108.0)

## 2018-09-08 LAB — LACTIC ACID, PLASMA
Lactic Acid, Venous: 5.7 mmol/L (ref 0.5–1.9)
Lactic Acid, Venous: 4.3 mmol/L (ref 0.5–1.9)
Lactic Acid, Venous: 3.7 mmol/L (ref 0.5–1.9)

## 2018-09-08 LAB — RAPID URINE DRUG SCREEN, HOSP PERFORMED
Amphetamines: NOT DETECTED
Barbiturates: NOT DETECTED
Benzodiazepines: NOT DETECTED
Cocaine: NOT DETECTED
Opiates: POSITIVE — AB
Tetrahydrocannabinol: NOT DETECTED

## 2018-09-08 LAB — PHOSPHORUS: Phosphorus: 2 mg/dL — ABNORMAL LOW (ref 2.5–4.6)

## 2018-09-08 LAB — URINALYSIS, ROUTINE W REFLEX MICROSCOPIC
Bilirubin Urine: NEGATIVE
Glucose, UA: 150 mg/dL — AB
Ketones, ur: NEGATIVE mg/dL
Leukocytes,Ua: NEGATIVE
Nitrite: NEGATIVE
Protein, ur: 100 mg/dL — AB
Specific Gravity, Urine: 1.017 (ref 1.005–1.030)
pH: 6 (ref 5.0–8.0)

## 2018-09-08 LAB — GLUCOSE, CAPILLARY
Glucose-Capillary: 113 mg/dL — ABNORMAL HIGH (ref 70–99)
Glucose-Capillary: 131 mg/dL — ABNORMAL HIGH (ref 70–99)

## 2018-09-08 LAB — MAGNESIUM: Magnesium: 1.7 mg/dL (ref 1.7–2.4)

## 2018-09-08 LAB — PROTIME-INR
INR: 1.4 — ABNORMAL HIGH (ref 0.8–1.2)
Prothrombin Time: 16.6 seconds — ABNORMAL HIGH (ref 11.4–15.2)

## 2018-09-08 LAB — APTT: aPTT: 28 seconds (ref 24–36)

## 2018-09-08 LAB — TROPONIN I (HIGH SENSITIVITY)
Troponin I (High Sensitivity): 9 ng/L (ref <18)
Troponin I (High Sensitivity): 125 ng/L (ref <18)

## 2018-09-08 LAB — ETHANOL: Alcohol, Ethyl (B): 276 mg/dL — ABNORMAL HIGH (ref <10)

## 2018-09-08 LAB — PROCALCITONIN: Procalcitonin: 0.12 ng/mL

## 2018-09-08 LAB — ACETAMINOPHEN LEVEL: Acetaminophen (Tylenol), Serum: <10 ug/mL — ABNORMAL LOW (ref 10–30)

## 2018-09-08 LAB — BRAIN NATRIURETIC PEPTIDE: B Natriuretic Peptide: 48.2 pg/mL (ref 0.0–100.0)

## 2018-09-08 LAB — SARS CORONAVIRUS 2 BY RT PCR (HOSPITAL ORDER, PERFORMED IN ~~LOC~~ HOSPITAL LAB): SARS Coronavirus 2: NEGATIVE

## 2018-09-08 LAB — CORTISOL: Cortisol, Plasma: 9.5 ug/dL

## 2018-09-08 LAB — TRIGLYCERIDES: Triglycerides: 182 mg/dL — ABNORMAL HIGH (ref <150)

## 2018-09-08 LAB — SALICYLATE LEVEL: Salicylate Lvl: <7 mg/dL (ref 2.8–30.0)

## 2018-09-08 MED ORDER — PROPOFOL 1000 MG/100ML IV EMUL
INTRAVENOUS | Status: AC
  Filled 2018-09-08: qty 100

## 2018-09-08 MED ORDER — CHLORHEXIDINE GLUCONATE 0.12% ORAL RINSE (MEDLINE KIT)
15.0000 mL | Freq: Two times a day (BID) | OROMUCOSAL | Status: DC
  Administered 2018-09-08 – 2018-10-25 (×94): 15 mL via OROMUCOSAL

## 2018-09-08 MED ORDER — FENTANYL CITRATE (PF) 100 MCG/2ML IJ SOLN
25.0000 ug | INTRAMUSCULAR | Status: DC | PRN
  Administered 2018-09-12: 18:00:00 25 ug via INTRAVENOUS
  Filled 2018-09-08: qty 2

## 2018-09-08 MED ORDER — SODIUM CHLORIDE 0.9 % IV SOLN
250.0000 mL | INTRAVENOUS | Status: DC
  Administered 2018-09-08 – 2018-09-16 (×4): 250 mL via INTRAVENOUS

## 2018-09-08 MED ORDER — ALBUTEROL SULFATE (2.5 MG/3ML) 0.083% IN NEBU
2.5000 mg | INHALATION_SOLUTION | RESPIRATORY_TRACT | Status: DC | PRN
  Administered 2018-09-19 – 2018-09-22 (×3): 2.5 mg via RESPIRATORY_TRACT
  Filled 2018-09-08 (×2): qty 3

## 2018-09-08 MED ORDER — INSULIN ASPART 100 UNIT/ML ~~LOC~~ SOLN
2.0000 [IU] | SUBCUTANEOUS | Status: DC
  Administered 2018-09-08 – 2018-09-09 (×2): 2 [IU] via SUBCUTANEOUS
  Administered 2018-09-09: 12:00:00 4 [IU] via SUBCUTANEOUS
  Administered 2018-09-09 – 2018-09-14 (×7): 2 [IU] via SUBCUTANEOUS

## 2018-09-08 MED ORDER — SODIUM CHLORIDE 0.9 % IV BOLUS
1000.0000 mL | Freq: Once | INTRAVENOUS | Status: AC
  Administered 2018-09-08: 16:00:00 1000 mL via INTRAVENOUS

## 2018-09-08 MED ORDER — PROPOFOL 1000 MG/100ML IV EMUL
5.0000 ug/kg/min | INTRAVENOUS | Status: DC
  Administered 2018-09-08: 16:00:00 10 ug/kg/min via INTRAVENOUS
  Administered 2018-09-09: 5 ug/kg/min via INTRAVENOUS
  Filled 2018-09-08 (×2): qty 100

## 2018-09-08 MED ORDER — NOREPINEPHRINE 4 MG/250ML-% IV SOLN: 2.0000 ug/min | INTRAVENOUS | Status: DC

## 2018-09-08 MED ORDER — FENTANYL CITRATE (PF) 100 MCG/2ML IJ SOLN
25.0000 ug | INTRAMUSCULAR | Status: DC | PRN
  Administered 2018-09-09 – 2018-09-13 (×5): 100 ug via INTRAVENOUS
  Filled 2018-09-08 (×5): qty 2

## 2018-09-08 MED ORDER — ORAL CARE MOUTH RINSE
15.0000 mL | OROMUCOSAL | Status: DC
  Administered 2018-09-08 – 2018-10-25 (×432): 15 mL via OROMUCOSAL

## 2018-09-08 MED ORDER — SODIUM CHLORIDE 0.9 % IV SOLN
INTRAVENOUS | Status: DC
  Administered 2018-09-08: 21:00:00 via INTRAVENOUS

## 2018-09-08 MED ORDER — POTASSIUM CHLORIDE 10 MEQ/100ML IV SOLN
10.0000 meq | INTRAVENOUS | Status: AC
  Administered 2018-09-08 – 2018-09-09 (×4): 10 meq via INTRAVENOUS
  Filled 2018-09-08 (×4): qty 100

## 2018-09-08 MED ORDER — POTASSIUM CHLORIDE 20 MEQ/15ML (10%) PO SOLN
40.0000 meq | Freq: Three times a day (TID) | ORAL | Status: AC
  Administered 2018-09-08 – 2018-09-09 (×2): 40 meq
  Filled 2018-09-08 (×2): qty 30

## 2018-09-08 MED ORDER — PANTOPRAZOLE SODIUM 40 MG PO PACK
40.0000 mg | PACK | Freq: Every day | ORAL | Status: DC
  Administered 2018-09-08: 22:00:00 40 mg
  Filled 2018-09-08 (×2): qty 20

## 2018-09-08 MED ORDER — CHLORHEXIDINE GLUCONATE CLOTH 2 % EX PADS
6.0000 | MEDICATED_PAD | Freq: Every day | CUTANEOUS | Status: DC
  Administered 2018-09-08 – 2018-09-14 (×6): 6 via TOPICAL

## 2018-09-08 NOTE — ED Notes (Signed)
Attempted report 

## 2018-09-08 NOTE — ED Provider Notes (Signed)
[facility] Provider Note   CSN: [id number] Arrival date & time: 09/08/18  1557    History   Chief Complaint No chief complaint on file.   HPI Micheal Carey is a 68 y.o. male who presents to the ED via EMS postarrest after being found down in a parking lot this afternoon.  EMS reports that bystanders found patient on the ground and found him to be pulseless.  CPR was initiated by bystanders.  EMS reports patient was asystolic on their arrival and received approximately 10 minutes of CPR and 1 round of epinephrine prior to obtaining ROSC.  A king airway was placed in the field.  Post-ROSC rhythm was sinus tachycardia.     The history is limited by the condition of the patient.    Past Medical History:  Diagnosis Date  . CAP (community acquired pneumonia) 10/14/2015  . Cardiac arrest (HCC) 09/08/2018  . ETOH abuse   . Opioid abuse (HCC)   . Pneumonia    "twice before" (10/14/2015)    Patient Active Problem List   Diagnosis Date Noted  . Acute respiratory failure with hypoxemia (HCC) 09/08/2018  . Trochanteric bursitis, right hip 08/27/2017  . History of total hip arthroplasty, right 08/27/2017  . COPD (chronic obstructive pulmonary disease) (HCC) 12/03/2015  . Hypoglycemia 12/03/2015  . Sepsis (HCC) 12/03/2015  . Intractable vomiting with nausea 10/15/2015  . Transaminasemia 10/15/2015  . Diarrhea 10/15/2015  . Lactic acidosis 10/14/2015  . AKI (acute kidney injury) (HCC) 10/14/2015  . Alcohol abuse 10/14/2015  . COPD with acute exacerbation (HCC) 10/14/2015  . CAP (community acquired pneumonia) 10/14/2015  . Atypical lymphocytes present on peripheral blood smear   . Dehydration     Past Surgical History:  Procedure Laterality Date  . JOINT REPLACEMENT    . TOTAL HIP ARTHROPLASTY Right         Home Medications    Prior to Admission medications   Medication Sig Start Date End Date Taking? Authorizing Provider  albuterol  (PROVENTIL HFA;VENTOLIN HFA) 108 (90 Base) MCG/ACT inhaler Inhale 2 puffs into the lungs every 6 (six) hours as needed for wheezing or shortness of breath.    [provider]  albuterol (PROVENTIL) (2.5 MG/3ML) 0.083% nebulizer solution Take 2.5 mg by nebulization every 6 (six) hours as needed for wheezing or shortness of breath.    [provider]  amLODipine (NORVASC) 5 MG tablet Take 5 mg by mouth daily. 08/16/17   [provider]  budesonide-formoterol (SYMBICORT) 160-4.5 MCG/ACT inhaler Inhale 2 puffs into the lungs 2 (two) times daily.    [provider]  BYSTOLIC 5 MG tablet Take 5 mg by mouth daily. 04/06/18   [provider]  Cholecalciferol (VITAMIN D PO) Take 1 tablet by mouth daily.    [provider]  cloNIDine (CATAPRES) 0.1 MG tablet Take 1 tablet (0.1 mg total) by mouth every 6 (six) hours for 3 days. 10/27/17 10/30/17  Renne CriglerGeiple, Joshua, PA-C  diclofenac sodium (VOLTAREN) 1 % GEL APPLY 4 GRAMS AA QID PRN FOR PAIN 04/05/18   [provider]  loperamide (IMODIUM) 2 MG capsule Take 1 capsule (2 mg total) by mouth 4 (four) times daily as needed for diarrhea or loose stools. Patient not taking: Reported on 03/20/2018 10/27/17   Renne CriglerGeiple, Joshua, PA-C  Multiple Vitamins-Minerals (MULTIVITAMIN WITH MINERALS) tablet Take 1 tablet by mouth daily.    [provider]  naproxen (NAPROSYN) 500 MG tablet TK 1 T PO BID  WF 12/20/17   [provider]  ondansetron (ZOFRAN ODT) 4 MG disintegrating tablet Take 1 tablet (4 mg total) by mouth every 8 (eight) hours as needed for nausea or vomiting. Patient not taking: Reported on 03/20/2018 10/27/17   Carlisle Cater, PA-C  oxyCODONE-acetaminophen (PERCOCET) 7.5-325 MG tablet Take 1 tablet by mouth every 6 (six) hours as needed for pain. 09/10/17   [provider]  oxyCODONE-acetaminophen (PERCOCET/ROXICET) 5-325 MG tablet Take 1 tablet by mouth every 6 (six) hours as needed for severe  pain. Patient not taking: Reported on 03/20/2018 10/27/17   Carlisle Cater, PA-C  potassium chloride SA (K-DUR,KLOR-CON) 20 MEQ tablet Take 20 mEq by mouth 2 (two) times daily.    [provider]  pregabalin (LYRICA) 100 MG capsule TK ONE C PO BID 11/14/17   [provider]  sildenafil (VIAGRA) 100 MG tablet Take 100 mg by mouth daily as needed for erectile dysfunction.    [provider]  tiZANidine (ZANAFLEX) 4 MG tablet TK 1 T PO BID PRN 03/06/18   [provider]    Family History Family History  Problem Relation Age of Onset  . Diabetes Mother   . Cancer Mother   . Diabetes Father   . Stroke Neg Hx   . CAD Neg Hx     Social History Social History   Tobacco Use  . Smoking status: Former Research scientist (life sciences)  . Smokeless tobacco: Never Used  Substance Use Topics  . Alcohol use: Yes    Comment: every day beer 4-5 cans   . Drug use: Not on file     Allergies   Patient has no known allergies.   Review of Systems Review of Systems  Unable to perform ROS: Patient unresponsive     Physical Exam Updated Vital Signs BP 130/76   Pulse 65   Resp 17   Ht 5\' 8"  (1.727 m)   Wt 68 kg   SpO2 100%   BMI 22.81 kg/m   Physical Exam Vitals signs and nursing note reviewed.  Constitutional:      General: He is in acute distress.     Appearance: He is ill-appearing and toxic-appearing.     Comments: Unresponsive.  HENT:     Head: Normocephalic and atraumatic.     Nose: Nose normal. No congestion or rhinorrhea.     Mouth/Throat:     Mouth: Mucous membranes are moist.     Pharynx: Oropharynx is clear.     Comments: King airway in place Eyes:     Conjunctiva/sclera: Conjunctivae normal.     Comments: Pupils are pinpoint bilaterally  Neck:     Musculoskeletal: Normal range of motion. No neck rigidity.  Cardiovascular:     Rate and Rhythm: Normal rate and regular rhythm.     Pulses: Normal pulses.     Heart sounds: Normal heart sounds.  Pulmonary:      Comments: Mechanically ventilated.  Breath sounds present and equal bilaterally. Abdominal:     General: Abdomen is flat. There is no distension.     Palpations: Abdomen is soft.  Musculoskeletal: Normal range of motion.        General: No swelling, tenderness, deformity or signs of injury.  Skin:    General: Skin is warm and dry.  Neurological:     GCS: GCS eye subscore is 1. GCS verbal subscore is 1. GCS motor subscore is 1.     Comments: Unresponsive.  No purposeful movements noted.  ED Treatments / Results  Labs (all labs ordered are listed, but only abnormal results are displayed) Labs Reviewed  CBC WITH DIFFERENTIAL/PLATELET - Abnormal; Notable for the following components:      Result Value   Abs Immature Granulocytes 0.21 (*)    All other components within normal limits  COMPREHENSIVE METABOLIC PANEL - Abnormal; Notable for the following components:   Potassium 3.1 (*)    CO2 20 (*)    Glucose, Bld 230 (*)    BUN 5 (*)    Creatinine, Ser 1.40 (*)    Calcium 8.1 (*)    AST 86 (*)    GFR calc non Af Amer 52 (*)    All other components within normal limits  ETHANOL - Abnormal; Notable for the following components:   Alcohol, Ethyl (B) 276 (*)    All other components within normal limits  ACETAMINOPHEN LEVEL - Abnormal; Notable for the following components:   Acetaminophen (Tylenol), Serum <10 (*)    All other components within normal limits  RAPID URINE DRUG SCREEN, HOSP PERFORMED - Abnormal; Notable for the following components:   Opiates POSITIVE (*)    All other components within normal limits  URINALYSIS, ROUTINE W REFLEX MICROSCOPIC - Abnormal; Notable for the following components:   APPearance HAZY (*)    Glucose, UA 150 (*)    Hgb urine dipstick SMALL (*)    Protein, ur 100 (*)    Bacteria, UA RARE (*)    All other components within normal limits  LACTIC ACID, PLASMA - Abnormal; Notable for the following components:   Lactic Acid, Venous 5.7 (*)     All other components within normal limits  LACTIC ACID, PLASMA - Abnormal; Notable for the following components:   Lactic Acid, Venous 4.3 (*)    All other components within normal limits  TRIGLYCERIDES - Abnormal; Notable for the following components:   Triglycerides 182 (*)    All other components within normal limits  POCT I-STAT 7, (LYTES, BLD GAS, ICA,H+H) - Abnormal; Notable for the following components:   pH, Arterial 7.475 (*)    pCO2 arterial 30.2 (*)    pO2, Arterial 486.0 (*)    Potassium 3.1 (*)    Calcium, Ion 1.03 (*)    HCT 34.0 (*)    Hemoglobin 11.6 (*)    All other components within normal limits  SARS CORONAVIRUS 2 (HOSPITAL ORDER, PERFORMED IN [facility])  URINE CULTURE  CULTURE, BLOOD (ROUTINE X 2)  CULTURE, BLOOD (ROUTINE X 2)  CULTURE, BLOOD (ROUTINE X 2)  CULTURE, BLOOD (ROUTINE X 2)  URINE CULTURE  CULTURE, RESPIRATORY  SALICYLATE LEVEL  BLOOD GAS, ARTERIAL  HIV ANTIBODY (ROUTINE TESTING W REFLEX)  COMPREHENSIVE METABOLIC PANEL  MAGNESIUM  PHOSPHORUS  LACTIC ACID, PLASMA  LACTIC ACID, PLASMA  PROCALCITONIN  BRAIN NATRIURETIC PEPTIDE  CORTISOL  CBC WITH DIFFERENTIAL/PLATELET  PROTIME-INR  APTT  URINALYSIS, ROUTINE W REFLEX MICROSCOPIC  BLOOD GAS, ARTERIAL  CBC  BASIC METABOLIC PANEL  BLOOD GAS, ARTERIAL  MAGNESIUM  PHOSPHORUS  TROPONIN I (HIGH SENSITIVITY)  TROPONIN I (HIGH SENSITIVITY)    EKG EKG Interpretation  Date/Time:  Sunday September 08 2018 16:01:57 EDT Ventricular Rate:  89 PR Interval:    QRS Duration: 119 QT Interval:  371 QTC Calculation: 452 R Axis:   91 Text Interpretation:  Sinus rhythm Nonspecific intraventricular conduction delay Anteroseptal infarct, age indeterminate ST elevation, consider inferior injury Artifact in lead(s) II III aVR aVL aVF V1 V2  V3 No significant change since last tracing Confirmed by Richardean Canal ([id number]) on 09/08/2018 4:13:54 PM   Radiology Dg Chest Port 1 View  Result Date:  09/08/2018 CLINICAL DATA:  68 year old male status post intubation. EXAM: PORTABLE CHEST 1 VIEW COMPARISON:  Chest x-ray 10/27/2017. FINDINGS: An endotracheal tube is in place with tip 4.8 cm above the carina. A nasogastric tube is seen extending into the stomach, however, the tip of the nasogastric tube extends below the lower margin of the image. Transcutaneous defibrillator pads project over the lower left hemithorax. Mild scarring in the right mid lung. Lung volumes are low. No consolidative airspace disease. No pleural effusions. No pneumothorax. No pulmonary nodule or mass noted. Pulmonary vasculature and the cardiomediastinal silhouette are within normal limits. IMPRESSION: 1. Support apparatus, as above. 2. Low lung volumes without radiographic evidence of acute cardiopulmonary disease. Electronically Signed   By: Trudie Reed M.D.   On: 09/08/2018 17:02    Procedures Procedure Name: Intubation Date/Time: 09/08/2018 4:10 PM Performed by: Garry Heater, MD Pre-anesthesia Checklist: Patient identified, Patient being monitored and Emergency Drugs available Preoxygenation: Pre-oxygenation with 100% oxygen (via king airway) Induction Type: Rapid sequence Laryngoscope Size: Glidescope Grade View: Grade I Tube size: 8.0 mm Number of attempts: 1 Airway Equipment and Method: Video-laryngoscopy and Rigid stylet Placement Confirmation: ETT inserted through vocal cords under direct vision,  Positive ETCO2 and Breath sounds checked- equal and bilateral Secured at: 24 cm Tube secured with: ETT holder Dental Injury: Teeth and Oropharynx as per pre-operative assessment       (including critical care time)  Medications Ordered in ED Medications  propofol (DIPRIVAN) 1000 MG/100ML infusion (has no administration in time range)  propofol (DIPRIVAN) 1000 MG/100ML infusion (10 mcg/kg/min  68 kg Intravenous New Bag/Given 09/08/18 1623)  pantoprazole sodium (PROTONIX) 40 mg/20 mL oral suspension 40 mg  (has no administration in time range)  0.9 %  sodium chloride infusion (has no administration in time range)  0.9 %  sodium chloride infusion (has no administration in time range)  norepinephrine (LEVOPHED) 4mg  in premix infusion (has no administration in time range)  potassium chloride 10 mEq in 100 mL IVPB (has no administration in time range)  potassium chloride 20 MEQ/15ML (10%) solution 40 mEq (has no administration in time range)  fentaNYL (SUBLIMAZE) injection 25 mcg (has no administration in time range)  fentaNYL (SUBLIMAZE) injection 25-100 mcg (has no administration in time range)  insulin aspart (novoLOG) injection 2-6 Units (has no administration in time range)  albuterol (PROVENTIL) (2.5 MG/3ML) 0.083% nebulizer solution 2.5 mg (has no administration in time range)  sodium chloride 0.9 % bolus 1,000 mL (0 mLs Intravenous Stopped 09/08/18 1741)     Initial Impression / Assessment and Plan / ED Course  I have reviewed the triage vital signs and the nursing notes.  Pertinent labs & imaging results that were available during my care of the patient were reviewed by me and considered in my medical decision making (see chart for details).        Micheal Carey is a 68 y.o. male who presents to the ED via EMS postarrest after being found down in a parking lot this afternoon.  ROSC was achieved in the field after approximately 10 minutes of CPR and one dose of epinephrine.  On arrival to the ED, patient's GCS was 3, he had adequate peripheral pulses, and he had a manual blood pressure in the 120s/60s.  Patient's king airway was exchanged for an ET  tube shortly after arrival.  Patient was intubated with rocuronium and etomidate in one attempt with video laryngoscopy.  Chest x-ray showed adequate ET tube placement and no acute cardiopulmonary abnormalities.  CT head and neck were ordered due to unknown cause of patient being found on the ground.  Labs significant for ethanol level  of 276 and lactic acid of 5.7.  COVID-19 testing negative.  UDS was positive for opiates.  High-sensitivity troponin was negative at 9.  Patient's cardiac arrest is favored to be secondary to respiratory arrest from intoxication with alcohol and opiates.  Critical care was consulted for admission and evaluated patient in the ED.  Patient will be admitted to the ICU.   Final Clinical Impressions(s) / ED Diagnoses   Final diagnoses:  Cardiac arrest (HCC)  Acute respiratory failure, unspecified whether with hypoxia or hypercapnia [facility](HCC)  Alcohol use    ED Discharge Orders    None       Garry HeaterEames, Cortasia Screws, MD 09/08/18 1936    Charlynne PanderYao, David Hsienta, MD 09/08/18 2257

## 2018-09-08 NOTE — Progress Notes (Signed)
Pt transported to CT then transported to 4Q59 without complications.

## 2018-09-08 NOTE — Progress Notes (Signed)
Patient arrived to 2M07 via stretcher from ED. Report taken from ED nurse via phone. Patient is intubated and unresponsive. Two bilateral PIV's in each wrist are C/D/I. Both flushed and saline locked. Patient also has a IO in left tibia. Flushed and saline locked. IV consult placed for IV nurse to come and remove. OGT to LWS. VSS. Temp is 96.4 (bladder). Warm blankets placed on patient.No family is present. Scd's placed on BLE.CHG bath given. Yellow, metal bracelet removed from patients right wrist and placed in specimen cup and placed in his patient belonging bag along with his clothing.  No issues at this time. Will continue to monitor.

## 2018-09-08 NOTE — H&P (Signed)
NAME:  Micheal Carey, MRN:  [id number], DOB:  [birth year], LOS: 0 ADMISSION DATE:  09/08/2018, CONSULTATION DATE:  09/08/2018 REFERRING MD:  EDP Silverio Lay- Yao, CHIEF COMPLAINT:  Cardiac arrest and acute respiratory failure   Brief History   68 year old male with history of etoh and opiate abuse who was found down in the parking lot where he was in PEA.  Resuscitated after 10 minutes with 1 epi and became hemodynamically stable but remained completely unresponsive.  PCCM was called on consultation.  No further history is available at this point.  Utox positive with opiates and etoh positive.    History of present illness   68 year old male with history of etoh and opiate abuse who was found down in the parking lot where he was in PEA.  Resuscitated after 10 minutes with 1 epi and became hemodynamically stable but remained completely unresponsive.  PCCM was called on consultation.  No further history is available at this point.  Utox positive with opiates and etoh positive.    Past Medical History  Substance abuse  Significant Hospital Events   CPR 8/9>>>  Consults:  PCCM  Procedures:  ETT 8/9>>>  Significant Diagnostic Tests:  Head CT 8/9>>> CXR 8/9>>>I reviewed myself, ETT is in a good place  Micro Data:  Blood 8/9>>> Urine 8/9>>> Sputum 8/9>>>  Antimicrobials:  N/A   Interim history/subjective:  Completely unresponsive, no events  Objective   Blood pressure 98/62, pulse 67, resp. rate 16, height 5\' 8"  (1.727 m), weight 68 kg, SpO2 100 %.    Vent Mode: PRVC FiO2 (%):  [100 %] 100 % Set Rate:  [16 bmp] 16 bmp Vt Set:  [560 mL] 560 mL PEEP:  [5 cmH20] 5 cmH20 Plateau Pressure:  [17 cmH20] 17 cmH20  No intake or output data in the 24 hours ending 09/08/18 1827 Filed Weights   09/08/18 1608  Weight: 68 kg    Examination: General: Acutely ill appearing male, unresponsive HENT: [place]/AT, PERRL, EOM-I and MMM, ETT in place Lungs: CTA bilaterally Cardiovascular: RRR, Nl S1/S2 and  -M/R/G Abdomen: Soft, NT, ND and +BS Extremities: -edema and -tenderness Neuro: Unresponsive, gag present, no corneal, no pupillary, no dolls eyes and no respiratory drive Skin: Intact  Resolved Hospital Problem list   N/A  Assessment & Plan:  68 year old male with substance abuse history that likely overdosed and suffered a cardiac arrest.  Discussed with EDP and PCCM-MD.  Cardiac arrest: likely due to respiratory arrest from overdose  - Tele monitoring  - Levophed for BP support if needed  - IVF  - No hypothermia given unknown downtime and unknown head CT status  Acute respiratory failure likely due to heroine overdose  - Full vent support  - Adjust vent for ABG  - Titrate O2 for sat of 88-92%  - PRN albuterol  - Hold SBT for now  Acute anoxic encephalopathy  - Head CT  - EEG  - Neuro consult in AM if needed  - No hypothermia due to unknown downtime and very poor neurologic exam  - D/C sedation  - Monitor  AKI:  - Replace K  - Recheck  - Check Mg and phos  - NS 100 ml/hour  - If no improvement will consider renal U/S  FEN:  - Nutrition for TF  - Protonix PO  - SCD's  - If head CT is negative will consider lovenox  - Social work to get family for plan of care  Labs   CBC: Recent Labs  Lab 09/08/18 1619 09/08/18 1700  WBC 10.5  --   NEUTROABS 5.9  --   HGB 13.1 11.6*  HCT 39.4 34.0*  MCV 86.2  --   PLT 261  --     Basic Metabolic Panel: Recent Labs  Lab 09/08/18 1619 09/08/18 1700  NA 140 144  K 3.1* 3.1*  CL 106  --   CO2 20*  --   GLUCOSE 230*  --   BUN 5*  --   CREATININE 1.40*  --   CALCIUM 8.1*  --    GFR: Estimated Creatinine Clearance: 50.6 mL/min (A) (by C-G formula based on SCr of 1.4 mg/dL (H)). Recent Labs  Lab 09/08/18 1619 09/08/18 1621 09/08/18 1645  WBC 10.5  --   --   LATICACIDVEN  --  5.7* 4.3*    Liver Function Tests: Recent Labs  Lab 09/08/18 1619  AST 86*  ALT 33  ALKPHOS 89  BILITOT 0.4  PROT 7.0   ALBUMIN 3.7   No results for input(s): LIPASE, AMYLASE in the last 168 hours. No results for input(s): AMMONIA in the last 168 hours.  ABG    Component Value Date/Time   PHART 7.475 (H) 09/08/2018 1700   PCO2ART 30.2 (L) 09/08/2018 1700   PO2ART 486.0 (H) 09/08/2018 1700   HCO3 22.2 09/08/2018 1700   TCO2 23 09/08/2018 1700   ACIDBASEDEF 1.0 09/08/2018 1700   O2SAT 100.0 09/08/2018 1700     Coagulation Profile: No results for input(s): INR, PROTIME in the last 168 hours.  Cardiac Enzymes: No results for input(s): CKTOTAL, CKMB, CKMBINDEX, TROPONINI in the last 168 hours.  HbA1C: Hgb A1c MFr Bld  Date/Time Value Ref Range Status  10/15/2015 09:06 AM 4.7 (L) 4.8 - 5.6 % Final    Comment:    (NOTE)         Pre-diabetes: 5.7 - 6.4         Diabetes: >6.4         Glycemic control for adults with diabetes: <7.0   10/14/2015 07:01 PM 4.9 4.8 - 5.6 % Final    Comment:    (NOTE)         Pre-diabetes: 5.7 - 6.4         Diabetes: >6.4         Glycemic control for adults with diabetes: <7.0     CBG: No results for input(s): GLUCAP in the last 168 hours.  Review of Systems:   Unattainable  Past Medical History  He,  has a past medical history of CAP (community acquired pneumonia) (10/14/2015) and Pneumonia.   Surgical History    Past Surgical History:  Procedure Laterality Date  . JOINT REPLACEMENT    . TOTAL HIP ARTHROPLASTY Right      Social History   reports that he has quit smoking. He has never used smokeless tobacco. He reports current alcohol use.   Family History   His family history includes Cancer in his mother; Diabetes in his father and mother. There is no history of Stroke or CAD.   Allergies No Known Allergies   Home Medications  Prior to Admission medications   Medication Sig Start Date End Date Taking? Authorizing Provider  albuterol (PROVENTIL HFA;VENTOLIN HFA) 108 (90 Base) MCG/ACT inhaler Inhale 2 puffs into the lungs every 6 (six) hours as  needed for wheezing or shortness of breath.    [provider]  albuterol (PROVENTIL) (2.5 MG/3ML) 0.083%  nebulizer solution Take 2.5 mg by nebulization every 6 (six) hours as needed for wheezing or shortness of breath.    [provider]  amLODipine (NORVASC) 5 MG tablet Take 5 mg by mouth daily. 08/16/17   [provider]  budesonide-formoterol (SYMBICORT) 160-4.5 MCG/ACT inhaler Inhale 2 puffs into the lungs 2 (two) times daily.    [provider]  BYSTOLIC 5 MG tablet Take 5 mg by mouth daily. 04/06/18   [provider]  Cholecalciferol (VITAMIN D PO) Take 1 tablet by mouth daily.    [provider]  cloNIDine (CATAPRES) 0.1 MG tablet Take 1 tablet (0.1 mg total) by mouth every 6 (six) hours for 3 days. 10/27/17 10/30/17  Carlisle Cater, PA-C  diclofenac sodium (VOLTAREN) 1 % GEL APPLY 4 GRAMS AA QID PRN FOR PAIN 04/05/18   [provider]  loperamide (IMODIUM) 2 MG capsule Take 1 capsule (2 mg total) by mouth 4 (four) times daily as needed for diarrhea or loose stools. Patient not taking: Reported on 03/20/2018 10/27/17   Carlisle Cater, PA-C  Multiple Vitamins-Minerals (MULTIVITAMIN WITH MINERALS) tablet Take 1 tablet by mouth daily.    [provider]  naproxen (NAPROSYN) 500 MG tablet TK 1 T PO BID WF 12/20/17   [provider]  ondansetron (ZOFRAN ODT) 4 MG disintegrating tablet Take 1 tablet (4 mg total) by mouth every 8 (eight) hours as needed for nausea or vomiting. Patient not taking: Reported on 03/20/2018 10/27/17   Carlisle Cater, PA-C  oxyCODONE-acetaminophen (PERCOCET) 7.5-325 MG tablet Take 1 tablet by mouth every 6 (six) hours as needed for pain. 09/10/17   [provider]  oxyCODONE-acetaminophen (PERCOCET/ROXICET) 5-325 MG tablet Take 1 tablet by mouth every 6 (six) hours as needed for severe pain. Patient not taking: Reported on 03/20/2018 10/27/17   Carlisle Cater, PA-C  potassium chloride SA  (K-DUR,KLOR-CON) 20 MEQ tablet Take 20 mEq by mouth 2 (two) times daily.    [provider]  pregabalin (LYRICA) 100 MG capsule TK ONE C PO BID 11/14/17   [provider]  sildenafil (VIAGRA) 100 MG tablet Take 100 mg by mouth daily as needed for erectile dysfunction.    [provider]  tiZANidine (ZANAFLEX) 4 MG tablet TK 1 T PO BID PRN 03/06/18   [provider]    The patient is critically ill with multiple organ systems failure and requires high complexity decision making for assessment and support, frequent evaluation and titration of therapies, application of advanced monitoring technologies and extensive interpretation of multiple databases.   Critical Care Time devoted to patient care services described in this note is  45  Minutes. This time reflects time of care of this signee Dr Jennet Maduro. This critical care time does not reflect procedure time, or teaching time or supervisory time of PA/NP/Med student/Med Resident etc but could involve care discussion time.  Rush Farmer, M.D. [facility] Pulmonary/Critical Care Medicine. Pager: [phone]. After hours pager: [phone].

## 2018-09-08 NOTE — ED Triage Notes (Signed)
Pt here post arrest found down in a parking lot asystole first rhythm then PEA , 10 mins  Of cpr and 1 epi then pulses regained , unknown down time before cpr started

## 2018-09-08 NOTE — ED Notes (Signed)
ED TO INPATIENT HANDOFF REPORT  ED Nurse Name and Phone #:  [phone] S Name/Age/Gender Micheal Carey 68 y.o. male Room/Bed: TRABC/TRABC  Code Status   Code Status: Full Code  Home/SNF/Other  Triage Complete: Triage complete  Chief Complaint CPR  Triage Note Pt here post arrest found down in a parking lot asystole first rhythm then PEA , 10 mins  Of cpr and 1 epi then pulses regained , unknown down time before cpr started    Allergies No Known Allergies  Level of Care/Admitting Diagnosis ED Disposition    ED Disposition Condition Comment   Admit  Hospital Area: [facility] [[id number]]  Level of Care: ICU [6]  Covid Evaluation: Confirmed COVID Negative  Diagnosis: Acute respiratory failure with hypoxemia [facility]) [[id number]]  Admitting Physician: Efraim Kaufmann  Attending Physician: Alyson Reedy [phone]  Estimated length of stay: 3 - 4 days  Certification:: I certify this patient will need inpatient services for at least 2 midnights  Bed request comments:  PT Class (Do Not Modify): Inpatient [101]  PT Acc Code (Do Not Modify): Private [1]       B Medical/Surgery History Past Medical History:  Diagnosis Date  . CAP (community acquired pneumonia) 10/14/2015  . Pneumonia    "twice before" (10/14/2015)   Past Surgical History:  Procedure Laterality Date  . JOINT REPLACEMENT    . TOTAL HIP ARTHROPLASTY Right      A IV Location/Drains/Wounds Patient Lines/Drains/Airways Status   Active Line/Drains/Airways    Name:   Placement date:   Placement time:   Site:   Days:   Peripheral IV 09/08/18 Right Wrist   09/08/18    1610    Wrist   less than 1   Peripheral IV 09/08/18 Left Wrist   09/08/18    1611    Wrist   less than 1   NG/OG Tube Orogastric 16 Fr. Center mouth Xray   09/08/18    1628    Center mouth   less than 1   Airway 8 mm   09/08/18    1600     less than 1          Intake/Output Last 24 hours No intake or output data in the  24 hours ending 09/08/18 1844  Labs/Imaging Results for orders placed or performed during the hospital encounter of 09/08/18 (from the past 48 hour(s))  Ethanol     Status: Abnormal   Collection Time: 09/08/18  4:18 PM  Result Value Ref Range   Alcohol, Ethyl (B) 276 (H) <10 mg/dL    Comment: (NOTE) Lowest detectable limit for serum alcohol is 10 mg/dL. For medical purposes only. Performed at [facility], [address] [place], [place] [address]   Salicylate level     Status: None   Collection Time: 09/08/18  4:18 PM  Result Value Ref Range   Salicylate Lvl <7.0 2.8 - 30.0 mg/dL    Comment: Performed at [facility], [address] [place], [place] [address]  Acetaminophen level     Status: Abnormal   Collection Time: 09/08/18  4:18 PM  Result Value Ref Range   Acetaminophen (Tylenol), Serum <10 (L) 10 - 30 ug/mL    Comment: (NOTE) Therapeutic concentrations vary significantly. A range of 10-30 ug/mL  may be an effective concentration for many patients. However, some  are best treated at concentrations outside of this range. Acetaminophen concentrations >150 ug/mL at 4  hours after ingestion  and >50 ug/mL at 12 hours after ingestion are often associated with  toxic reactions. Performed at [facility], [place] [address], [place], [place] [address]   CBC with Differential/Platelet     Status: Abnormal   Collection Time: 09/08/18  4:19 PM  Result Value Ref Range   WBC 10.5 4.0 - 10.5 K/uL   RBC 4.57 4.22 - 5.81 MIL/uL   Hemoglobin 13.1 13.0 - 17.0 g/dL   HCT 39.4 39.0 - 52.0 %   MCV 86.2 80.0 - 100.0 fL   MCH 28.7 26.0 - 34.0 pg   MCHC 33.2 30.0 - 36.0 g/dL   RDW 12.5 11.5 - 15.5 %   Platelets 261 150 - 400 K/uL   nRBC 0.0 0.0 - 0.2 %   Neutrophils Relative % 55 %   Neutro Abs 5.9 1.7 - 7.7 K/uL   Lymphocytes Relative 30 %   Lymphs Abs 3.2 0.7 - 4.0 K/uL   Monocytes Relative 8 %   Monocytes Absolute 0.8 0.1 - 1.0 K/uL   Eosinophils Relative 4 %    Eosinophils Absolute 0.4 0.0 - 0.5 K/uL   Basophils Relative 1 %   Basophils Absolute 0.1 0.0 - 0.1 K/uL   Immature Granulocytes 2 %   Abs Immature Granulocytes 0.21 (H) 0.00 - 0.07 K/uL    Comment: Performed at [place] [address] [place], [place] [address]  Comprehensive metabolic panel     Status: Abnormal   Collection Time: 09/08/18  4:19 PM  Result Value Ref Range   Sodium 140 135 - 145 mmol/L   Potassium 3.1 (L) 3.5 - 5.1 mmol/L   Chloride 106 98 - 111 mmol/L   CO2 20 (L) 22 - 32 mmol/L   Glucose, Bld 230 (H) 70 - 99 mg/dL   BUN 5 (L) 8 - 23 mg/dL   Creatinine, Ser 1.40 (H) 0.61 - 1.24 mg/dL   Calcium 8.1 (L) 8.9 - 10.3 mg/dL   Total Protein 7.0 6.5 - 8.1 g/dL   Albumin 3.7 3.5 - 5.0 g/dL   AST 86 (H) 15 - 41 U/L   ALT 33 0 - 44 U/L   Alkaline Phosphatase 89 38 - 126 U/L   Total Bilirubin 0.4 0.3 - 1.2 mg/dL   GFR calc non Af Amer 52 (L) >60 mL/min   GFR calc Af Amer >60 >60 mL/min   Anion gap 14 5 - 15    Comment: Performed at [facility], [place] [address] [place], [place] [address]  Troponin I (High Sensitivity)     Status: None   Collection Time: 09/08/18  4:19 PM  Result Value Ref Range   Troponin I (High Sensitivity) 9 <18 ng/L    Comment: (NOTE) Elevated high sensitivity troponin I (hsTnI) values and significant  changes across serial measurements may suggest ACS but many other  chronic and acute conditions are known to elevate hsTnI results.  Refer to the "Links" section for chest pain algorithms and additional  guidance. Performed at [facility], [place] [address], [place], [place] [address]   Triglycerides     Status: Abnormal   Collection Time: 09/08/18  4:19 PM  Result Value Ref Range   Triglycerides 182 (H) <150 mg/dL    Comment: Performed at [place] [address] [place], [place] [address]  Lactic acid, plasma     Status: Abnormal   Collection Time: 09/08/18  4:21 PM  Result Value Ref Range   Lactic Acid,  Venous 5.7 (HH) 0.5 - 1.9 mmol/L    Comment: CRITICAL RESULT CALLED TO, READ BACK BY AND VERIFIED WITH: Kevan RosebushGROSE, J RN @ [phone] ON 09/08/2018 BY TEMOCHE, H Performed at [facility], [address] [place], [place] [address]   SARS Coronavirus 2 [facility](Hospital order, Performed in [facility] hospital lab) Nasopharyngeal Nasopharyngeal Swab     Status: None   Collection Time: 09/08/18  4:28 PM   Specimen: Nasopharyngeal Swab  Result Value Ref Range   SARS Coronavirus 2 NEGATIVE NEGATIVE    Comment: (NOTE) If result is NEGATIVE SARS-CoV-2 target nucleic acids are NOT DETECTED. The SARS-CoV-2 RNA is generally detectable in upper and lower  respiratory specimens during the acute phase of infection. The lowest  concentration of SARS-CoV-2 viral copies this assay can detect is 250  copies / mL. A negative result does not preclude SARS-CoV-2 infection  and should not be used as the sole basis for treatment or other  patient management decisions.  A negative result may occur with  improper specimen collection / handling, submission of specimen other  than nasopharyngeal swab, presence of viral mutation(s) within the  areas targeted by this assay, and inadequate number of viral copies  (<250 copies / mL). A negative result must be combined with clinical  observations, patient history, and epidemiological information. If result is POSITIVE SARS-CoV-2 target nucleic acids are DETECTED. The SARS-CoV-2 RNA is generally detectable in upper and lower  respiratory specimens dur ing the acute phase of infection.  Positive  results are indicative of active infection with SARS-CoV-2.  Clinical  correlation with patient history and other diagnostic information is  necessary to determine patient infection status.  Positive results do  not rule out bacterial infection or co-infection with other viruses. If result is PRESUMPTIVE POSTIVE SARS-CoV-2 nucleic acids MAY BE PRESENT.   A presumptive positive  result was obtained on the submitted specimen  and confirmed on repeat testing.  While 2019 novel coronavirus  (SARS-CoV-2) nucleic acids may be present in the submitted sample  additional confirmatory testing may be necessary for epidemiological  and / or clinical management purposes  to differentiate between  SARS-CoV-2 and other Sarbecovirus currently known to infect humans.  If clinically indicated additional testing with an alternate test  methodology [id number]) is advised. The SARS-CoV-2 RNA is generally  detectable in upper and lower respiratory sp ecimens during the acute  phase of infection. The expected result is Negative. Fact Sheet for Patients:  BoilerBrush.com.cyhttps://www.fda.gov/media/136312/download Fact Sheet for Healthcare Providers: https://pope.com/https://www.fda.gov/media/136313/download This test is not yet approved or cleared by the [place] FDA and has been authorized for detection and/or diagnosis of SARS-CoV-2 by FDA under an Emergency Use Authorization (EUA).  This EUA will remain in effect (meaning this test can be used) for the duration of the COVID-19 declaration under Section 564(b)(1) of the Act, 21 U.S.C. section 360bbb-3(b)(1), unless the authorization is terminated or revoked sooner. Performed at [facility], [address] [place], [place] [address]   Rapid urine drug screen (hospital performed)     Status: Abnormal   Collection Time: 09/08/18  4:41 PM  Result Value Ref Range   Opiates POSITIVE (A) NONE DETECTED   Cocaine NONE DETECTED NONE DETECTED   Benzodiazepines NONE DETECTED NONE DETECTED   Amphetamines NONE DETECTED NONE DETECTED   Tetrahydrocannabinol NONE DETECTED NONE DETECTED   Barbiturates NONE DETECTED NONE DETECTED    Comment: (NOTE) DRUG SCREEN  FOR MEDICAL PURPOSES ONLY.  IF CONFIRMATION IS NEEDED FOR ANY PURPOSE, NOTIFY LAB WITHIN 5 DAYS. LOWEST DETECTABLE LIMITS FOR URINE DRUG SCREEN Drug Class                     Cutoff (ng/mL) Amphetamine  and metabolites    1000 Barbiturate and metabolites    200 Benzodiazepine                 200 Tricyclics and metabolites     300 Opiates and metabolites        300 Cocaine and metabolites        300 THC                            50 Performed at [facility], [address], [place], [place] [address]   Urinalysis, Routine w reflex microscopic     Status: Abnormal   Collection Time: 09/08/18  4:41 PM  Result Value Ref Range   Color, Urine YELLOW YELLOW   APPearance HAZY (A) CLEAR   Specific Gravity, Urine 1.017 1.005 - 1.030   pH 6.0 5.0 - 8.0   Glucose, UA 150 (A) NEGATIVE mg/dL   Hgb urine dipstick SMALL (A) NEGATIVE   Bilirubin Urine NEGATIVE NEGATIVE   Ketones, ur NEGATIVE NEGATIVE mg/dL   Protein, ur 604 (A) NEGATIVE mg/dL   Nitrite NEGATIVE NEGATIVE   Leukocytes,Ua NEGATIVE NEGATIVE   RBC / HPF 0-5 0 - 5 RBC/hpf   WBC, UA 11-20 0 - 5 WBC/hpf   Bacteria, UA RARE (A) NONE SEEN   Mucus PRESENT    Hyaline Casts, UA PRESENT     Comment: Performed at [facility], [address] [place], [place] [address]  Lactic acid, plasma     Status: Abnormal   Collection Time: 09/08/18  4:45 PM  Result Value Ref Range   Lactic Acid, Venous 4.3 (HH) 0.5 - 1.9 mmol/L    Comment: CRITICAL RESULT CALLED TO, READ BACK BY AND VERIFIED WITH: REABLEB, G RN @ 1730 ON 09/08/2018 BY TEMOCHE, H Performed at [facility], [address], [place], [place] [address]   I-STAT 7, (LYTES, BLD GAS, ICA, H+H)     Status: Abnormal   Collection Time: 09/08/18  5:00 PM  Result Value Ref Range   pH, Arterial 7.475 (H) 7.350 - 7.450   pCO2 arterial 30.2 (L) 32.0 - 48.0 mmHg   pO2, Arterial 486.0 (H) 83.0 - 108.0 mmHg   Bicarbonate 22.2 20.0 - 28.0 mmol/L   TCO2 23 22 - 32 mmol/L   O2 Saturation 100.0 %   Acid-base deficit 1.0 0.0 - 2.0 mmol/L   Sodium 144 135 - 145 mmol/L   Potassium 3.1 (L) 3.5 - 5.1 mmol/L   Calcium, Ion 1.03 (L) 1.15 - 1.40 mmol/L   HCT 34.0 (L) 39.0 - 52.0 %    Hemoglobin 11.6 (L) 13.0 - 17.0 g/dL   Patient temperature 78.2 F    Collection site RADIAL, ALLEN'S TEST ACCEPTABLE    Drawn by Operator    Sample type ARTERIAL    Dg Chest Port 1 View  Result Date: 09/08/2018 CLINICAL DATA:  68 year old male status post intubation. EXAM: PORTABLE CHEST 1 VIEW COMPARISON:  Chest x-ray 10/27/2017. FINDINGS: An endotracheal tube is in place with tip 4.8 cm above the carina. A nasogastric tube is seen extending into the stomach, however, the tip of the  nasogastric tube extends below the lower margin of the image. Transcutaneous defibrillator pads project over the lower left hemithorax. Mild scarring in the right mid lung. Lung volumes are low. No consolidative airspace disease. No pleural effusions. No pneumothorax. No pulmonary nodule or mass noted. Pulmonary vasculature and the cardiomediastinal silhouette are within normal limits. IMPRESSION: 1. Support apparatus, as above. 2. Low lung volumes without radiographic evidence of acute cardiopulmonary disease. Electronically Signed   By: Trudie Reedaniel  Entrikin M.D.   On: 09/08/2018 17:02    Pending Labs Unresulted Labs (From admission, onward)    Start     Ordered   09/09/18 0500  CBC  Tomorrow morning,   R     09/08/18 1828   09/09/18 0500  Basic metabolic panel  Tomorrow morning,   R     09/08/18 1828   09/09/18 0500  Blood gas, arterial  Tomorrow morning,   R     09/08/18 1828   09/09/18 0500  Magnesium  Tomorrow morning,   R     09/08/18 1828   09/09/18 0500  Phosphorus  Tomorrow morning,   R     09/08/18 1828   09/08/18 1824  Comprehensive metabolic panel  Once,   STAT     09/08/18 1828   09/08/18 1824  Magnesium  Once,   STAT     09/08/18 1828   09/08/18 1824  Phosphorus  Once,   STAT     09/08/18 1828   09/08/18 1824  Lactic acid, plasma  STAT Now then every 3 hours,   R (with STAT occurrences)     09/08/18 1828   09/08/18 1824  Procalcitonin  Once,   STAT     09/08/18 1828   09/08/18 1824  Brain  natriuretic peptide  Once,   STAT     09/08/18 1828   09/08/18 1824  Cortisol  ONCE - STAT,   STAT     09/08/18 1828   09/08/18 1824  CBC WITH DIFFERENTIAL  Once,   STAT     09/08/18 1828   09/08/18 1824  Protime-INR  Once,   STAT     09/08/18 1828   09/08/18 1824  APTT  Once,   STAT     09/08/18 1828   09/08/18 1824  Culture, blood (routine x 2)  BLOOD CULTURE X 2,   R (with STAT occurrences)     09/08/18 1828   09/08/18 1824  Urine culture  Once,   STAT     09/08/18 1828   09/08/18 1824  Culture, respiratory (tracheal aspirate)  Once,   STAT     09/08/18 1828   09/08/18 1824  Urinalysis, Routine w reflex microscopic  Once,   STAT     09/08/18 1828   09/08/18 1824  Blood gas, arterial  Once,   R     09/08/18 1828   09/08/18 1821  HIV antibody (Routine Testing)  Once,   STAT     09/08/18 1828   09/08/18 1648  Blood gas, arterial  Once,   R     09/08/18 1647   09/08/18 1613  Urine culture  ONCE - STAT,   STAT     09/08/18 1612   09/08/18 1613  Blood culture (routine x 2)  BLOOD CULTURE X 2,   STAT     09/08/18 1612   09/08/18 1611  Triglycerides  (propofol (DIPRIVAN) infusion)  Every 72 hours,   R    Comments: while on propofol (  DIPRIVAN)    09/08/18 1611          Vitals/Pain Today's Vitals   09/08/18 1745 09/08/18 1800 09/08/18 1815 09/08/18 1830  BP: 103/66 106/68 138/76 123/81  Pulse: 66 63 72 66  Resp: 16 16 17 16   SpO2: 100% 100% 100% 100%  Weight:      Height:      PainSc:        Isolation Precautions Airborne and Contact precautions  Medications Medications  propofol (DIPRIVAN) 1000 MG/100ML infusion (has no administration in time range)  propofol (DIPRIVAN) 1000 MG/100ML infusion (10 mcg/kg/min  68 kg Intravenous New Bag/Given 09/08/18 1623)  pantoprazole sodium (PROTONIX) 40 mg/20 mL oral suspension 40 mg (has no administration in time range)  0.9 %  sodium chloride infusion (has no administration in time range)  0.9 %  sodium chloride infusion (has no  administration in time range)  norepinephrine (LEVOPHED) 4mg  in 250mL premix infusion (has no administration in time range)  potassium chloride 10 mEq in 100 mL IVPB (has no administration in time range)  potassium chloride 20 MEQ/15ML (10%) solution 40 mEq (has no administration in time range)  fentaNYL (SUBLIMAZE) injection 25 mcg (has no administration in time range)  fentaNYL (SUBLIMAZE) injection 25-100 mcg (has no administration in time range)  insulin aspart (novoLOG) injection 2-6 Units (has no administration in time range)  albuterol (PROVENTIL) (2.5 MG/3ML) 0.083% nebulizer solution 2.5 mg (has no administration in time range)  sodium chloride 0.9 % bolus 1,000 mL (0 mLs Intravenous Stopped 09/08/18 1741)    Mobility      Focused Assessments    R Recommendations: See Admitting Provider Note  Report given to:   Additional Notes:

## 2018-09-08 NOTE — ED Notes (Signed)
Sedation was turned off by CC MD 1820. Pt still has no purposeful movement.

## 2018-09-08 NOTE — Progress Notes (Signed)
IO removed from left tibia without complications. Vaseline and gauze with pressure dressing applied and pressure held for 15 minutes.

## 2018-09-09 ENCOUNTER — Inpatient Hospital Stay (HOSPITAL_COMMUNITY): Payer: Medicare Other

## 2018-09-09 ENCOUNTER — Other Ambulatory Visit: Payer: Self-pay

## 2018-09-09 ENCOUNTER — Encounter (HOSPITAL_COMMUNITY): Payer: Self-pay

## 2018-09-09 DIAGNOSIS — J9601 Acute respiratory failure with hypoxia: Secondary | ICD-10-CM

## 2018-09-09 DIAGNOSIS — E876 Hypokalemia: Secondary | ICD-10-CM

## 2018-09-09 DIAGNOSIS — G931 Anoxic brain damage, not elsewhere classified: Secondary | ICD-10-CM

## 2018-09-09 DIAGNOSIS — I340 Nonrheumatic mitral (valve) insufficiency: Secondary | ICD-10-CM

## 2018-09-09 DIAGNOSIS — I469 Cardiac arrest, cause unspecified: Secondary | ICD-10-CM

## 2018-09-09 LAB — BASIC METABOLIC PANEL
Anion gap: 12 (ref 5–15)
BUN: 5 mg/dL — ABNORMAL LOW (ref 8–23)
CO2: 20 mmol/L — ABNORMAL LOW (ref 22–32)
Calcium: 7.9 mg/dL — ABNORMAL LOW (ref 8.9–10.3)
Chloride: 108 mmol/L (ref 98–111)
Creatinine, Ser: 0.84 mg/dL (ref 0.61–1.24)
GFR calc Af Amer: >60 mL/min (ref >60)
GFR calc non Af Amer: >60 mL/min (ref >60)
Glucose, Bld: 112 mg/dL — ABNORMAL HIGH (ref 70–99)
Potassium: 3.6 mmol/L (ref 3.5–5.1)
Sodium: 140 mmol/L (ref 135–145)

## 2018-09-09 LAB — POCT I-STAT 7, (LYTES, BLD GAS, ICA,H+H)
Acid-base deficit: 1 mmol/L (ref 0.0–2.0)
Bicarbonate: 23 mmol/L (ref 20.0–28.0)
Calcium, Ion: 1.12 mmol/L — ABNORMAL LOW (ref 1.15–1.40)
HCT: 35 % — ABNORMAL LOW (ref 39.0–52.0)
Hemoglobin: 11.9 g/dL — ABNORMAL LOW (ref 13.0–17.0)
O2 Saturation: 100 %
Patient temperature: 98.6
Potassium: 3.6 mmol/L (ref 3.5–5.1)
Sodium: 144 mmol/L (ref 135–145)
TCO2: 24 mmol/L (ref 22–32)
pCO2 arterial: 36.7 mmHg (ref 32.0–48.0)
pH, Arterial: 7.405 (ref 7.350–7.450)
pO2, Arterial: 167 mmHg — ABNORMAL HIGH (ref 83.0–108.0)

## 2018-09-09 LAB — CBC
HCT: 35.7 % — ABNORMAL LOW (ref 39.0–52.0)
Hemoglobin: 12.1 g/dL — ABNORMAL LOW (ref 13.0–17.0)
MCH: 28.6 pg (ref 26.0–34.0)
MCHC: 33.9 g/dL (ref 30.0–36.0)
MCV: 84.4 fL (ref 80.0–100.0)
Platelets: 227 10*3/uL (ref 150–400)
RBC: 4.23 MIL/uL (ref 4.22–5.81)
RDW: 12.4 % (ref 11.5–15.5)
WBC: 11.4 10*3/uL — ABNORMAL HIGH (ref 4.0–10.5)
nRBC: 0 % (ref 0.0–0.2)

## 2018-09-09 LAB — URINE CULTURE: Culture: NO GROWTH

## 2018-09-09 LAB — LACTIC ACID, PLASMA: Lactic Acid, Venous: 2.6 mmol/L (ref 0.5–1.9)

## 2018-09-09 LAB — PHOSPHORUS: Phosphorus: 1.6 mg/dL — ABNORMAL LOW (ref 2.5–4.6)

## 2018-09-09 LAB — HIV ANTIBODY (ROUTINE TESTING W REFLEX): HIV Screen 4th Generation wRfx: NONREACTIVE

## 2018-09-09 LAB — GLUCOSE, CAPILLARY
Glucose-Capillary: 102 mg/dL — ABNORMAL HIGH (ref 70–99)
Glucose-Capillary: 110 mg/dL — ABNORMAL HIGH (ref 70–99)
Glucose-Capillary: 124 mg/dL — ABNORMAL HIGH (ref 70–99)
Glucose-Capillary: 137 mg/dL — ABNORMAL HIGH (ref 70–99)
Glucose-Capillary: 147 mg/dL — ABNORMAL HIGH (ref 70–99)
Glucose-Capillary: 170 mg/dL — ABNORMAL HIGH (ref 70–99)

## 2018-09-09 LAB — MRSA PCR SCREENING

## 2018-09-09 LAB — ECHOCARDIOGRAM COMPLETE
Height: 68 in
Weight: 2324.53 oz

## 2018-09-09 LAB — MAGNESIUM: Magnesium: 1.5 mg/dL — ABNORMAL LOW (ref 1.7–2.4)

## 2018-09-09 MED ORDER — ENOXAPARIN SODIUM 40 MG/0.4ML ~~LOC~~ SOLN
40.0000 mg | SUBCUTANEOUS | Status: DC
  Administered 2018-09-09 – 2018-09-16 (×8): 40 mg via SUBCUTANEOUS
  Filled 2018-09-09 (×8): qty 0.4

## 2018-09-09 MED ORDER — DEXMEDETOMIDINE HCL IN NACL 400 MCG/100ML IV SOLN: 0.4000 ug/kg/h | INTRAVENOUS | Status: DC

## 2018-09-09 MED ORDER — VITAL AF 1.2 CAL PO LIQD
1000.0000 mL | ORAL | Status: DC
  Administered 2018-09-09: 1000 mL

## 2018-09-09 MED ORDER — SODIUM CHLORIDE 0.9 % IV SOLN
6.0000 g | Freq: Once | INTRAVENOUS | Status: AC
  Administered 2018-09-09: 05:00:00 6 g via INTRAVENOUS
  Filled 2018-09-09: qty 12

## 2018-09-09 MED ORDER — PANTOPRAZOLE SODIUM 40 MG IV SOLR
40.0000 mg | INTRAVENOUS | Status: DC
  Administered 2018-09-09 – 2018-09-13 (×5): 40 mg via INTRAVENOUS
  Filled 2018-09-09 (×5): qty 40

## 2018-09-09 MED ORDER — SODIUM PHOSPHATES 45 MMOLE/15ML IV SOLN
30.0000 mmol | Freq: Once | INTRAVENOUS | Status: AC
  Administered 2018-09-09: 30 mmol via INTRAVENOUS
  Filled 2018-09-09: qty 10

## 2018-09-09 MED ORDER — PNEUMOCOCCAL VAC POLYVALENT 25 MCG/0.5ML IJ INJ
0.5000 mL | INJECTION | INTRAMUSCULAR | Status: AC | PRN
  Administered 2018-11-08: 16:00:00 0.5 mL via INTRAMUSCULAR

## 2018-09-09 MED ORDER — PROPOFOL 1000 MG/100ML IV EMUL
5.0000 ug/kg/min | INTRAVENOUS | Status: DC
  Administered 2018-09-09: 25 ug/kg/min via INTRAVENOUS
  Administered 2018-09-09: 20 ug/kg/min via INTRAVENOUS
  Administered 2018-09-10: 50 ug/kg/min via INTRAVENOUS
  Administered 2018-09-10: 14:00:00 35 ug/kg/min via INTRAVENOUS
  Administered 2018-09-10: 19:00:00 50 ug/kg/min via INTRAVENOUS
  Administered 2018-09-10: 25 ug/kg/min via INTRAVENOUS
  Administered 2018-09-11: 40 ug/kg/min via INTRAVENOUS
  Administered 2018-09-11: 60 ug/kg/min via INTRAVENOUS
  Administered 2018-09-11 (×2): 40 ug/kg/min via INTRAVENOUS
  Administered 2018-09-12: 30 ug/kg/min via INTRAVENOUS
  Administered 2018-09-12: 60 ug/kg/min via INTRAVENOUS
  Administered 2018-09-12: 40 ug/kg/min via INTRAVENOUS
  Administered 2018-09-13: 60 ug/kg/min via INTRAVENOUS
  Administered 2018-09-13: 70 ug/kg/min via INTRAVENOUS
  Administered 2018-09-13: 50 ug/kg/min via INTRAVENOUS
  Administered 2018-09-13: 60 ug/kg/min via INTRAVENOUS
  Administered 2018-09-14 (×2): 70 ug/kg/min via INTRAVENOUS
  Filled 2018-09-09 (×3): qty 100
  Filled 2018-09-09: qty 200
  Filled 2018-09-09 (×9): qty 100
  Filled 2018-09-09: qty 3000
  Filled 2018-09-09 (×8): qty 100

## 2018-09-09 MED ORDER — PRO-STAT SUGAR FREE PO LIQD
30.0000 mL | Freq: Every day | ORAL | Status: DC
  Administered 2018-09-09 – 2018-09-11 (×3): 30 mL
  Filled 2018-09-09 (×5): qty 30

## 2018-09-09 MED ORDER — CHLORHEXIDINE GLUCONATE 0.12 % MT SOLN
OROMUCOSAL | Status: AC
  Filled 2018-09-09: qty 15

## 2018-09-09 NOTE — Progress Notes (Signed)
Initial Nutrition Assessment  DOCUMENTATION CODES:   Not applicable  INTERVENTION:    Vital AF 1.2 at 50 ml/h (1200 ml per day)   Pro-stat 30 ml once daily   Provides 1540 kcal (1778 kcal total with Propofol), 105 gm protein, 973 ml free water daily  NUTRITION DIAGNOSIS:   Inadequate oral intake related to inability to eat as evidenced by NPO status.  GOAL:   Patient will meet greater than or equal to 90% of their needs  MONITOR:   Vent status, TF tolerance, Labs  REASON FOR ASSESSMENT:   Ventilator, Consult Enteral/tube feeding initiation and management  ASSESSMENT:   68 yo male admitted with PEA arrest after being found down in a parking lot. Urine screen positive for opiates, ETOH positive. PMH includes alcohol and opiate abuse, CAP.   Received MD Consult for TF initiation and management. OGT in place.   Patient is currently intubated on ventilator support. Patient was unable to be weaned this morning. MV: 7.6 L/min Temp (24hrs), Avg:98.9 F (37.2 C), Min:96.4 F (35.8 C), Max:100 F (37.8 C)  Propofol: 9 ml/hr providing 238 kcal from lipid  Labs reviewed. Phosphorus 1.6 (L), magnesium 1.5 (L) CBG's: 102-137  Medications reviewed and include novolog, KCl, mag sulfate, sodium phosphate, propofol.    NUTRITION - FOCUSED PHYSICAL EXAM:  unable to complete, working remotely  Diet Order:   Diet Order            Diet NPO time specified  Diet effective now              EDUCATION NEEDS:   No education needs have been identified at this time  Skin:  Skin Assessment: Reviewed RN Assessment  Last BM:  PTA  Height:   Ht Readings from Last 1 Encounters:  09/08/18 5\' 8"  (1.727 m)    Weight:   Wt Readings from Last 1 Encounters:  09/09/18 65.9 kg    Ideal Body Weight:  70 kg  BMI:  Body mass index is 22.09 kg/m.  Estimated Nutritional Needs:   Kcal:  1700  Protein:  100-115 gm  Fluid:  >/= 1.7 L    Molli Barrows, RD, LDN,  [place] Pager [phone] After Hours Pager [phone]

## 2018-09-09 NOTE — Progress Notes (Signed)
[facility] Progress Note Patient Name: Rashed Edler DOB: 1950/10/08 MRN: [id number]   Date of Service  09/09/2018  HPI/Events of Note  NGT to LIS - Not able to get Protonix per tube.   eICU Interventions  Will order Protonix IV.      Intervention Category Major Interventions: Other:  Lysle Dingwall 09/09/2018, 10:28 PM

## 2018-09-09 NOTE — Progress Notes (Signed)
CRITICAL VALUE ALERT  Critical Value:  TROPONIN 125  Date & Time Notied:  09/08/18  2147  Provider Notified: Warren Lacy (LIZ)  Orders Received/Actions taken: NO ORDERS AT THIS TIME

## 2018-09-09 NOTE — Progress Notes (Signed)
  Echocardiogram 2D Echocardiogram has been performed.  Micheal Carey 09/09/2018, 10:58 AM

## 2018-09-09 NOTE — Progress Notes (Signed)
[facility] Progress Note Patient Name: Eula Mazzola DOB: 01/08/1951 MRN: [id number]   Date of Service  09/09/2018  HPI/Events of Note  Hypophosphatemia - PO4--- = 1.5 and Creatinine = 0.84.   eICU Interventions  Will replace PO4---.     Intervention Category Major Interventions: Electrolyte abnormality - evaluation and management  Sommer,Steven Eugene 09/09/2018, 4:14 AM

## 2018-09-09 NOTE — Progress Notes (Signed)
[facility] ADULT ICU REPLACEMENT PROTOCOL FOR AM LAB REPLACEMENT ONLY  The patient does apply for the [facility] Adult ICU Electrolyte Replacment Protocol based on the criteria listed below:   1. Is GFR >/= 40 ml/min? Yes.    Patient's GFR today is >60 2. Is urine output >/= 0.5 ml/kg/hr for the last 6 hours? Yes.   Patient's UOP is 0.8 ml/kg/hr 3. Is BUN < 60 mg/dL? Yes.    Patient's BUN today is 5 4. Abnormal electrolyte(s): k 3.6, mag 1.5, phos 1.6 5. Ordered repletion with: protocol 6. If a panic level lab has been reported, has the CCM MD in charge been notified? Yes.  .   Physician:  Oletta Darter Previously placed orders for standing potassium replacement already. Replace mag. Asked MD to order phos d/t limited IV access, mag and phos both run over 6 hours  Micheal Carey A 09/09/2018 3:59 AM

## 2018-09-09 NOTE — Progress Notes (Signed)
Attempted wean with pt. Pt had no pt effort and was placed back on previous mode. RT will attempt wean at a later time. RN aware.

## 2018-09-09 NOTE — Progress Notes (Signed)
EEG complete - results pending 

## 2018-09-09 NOTE — Procedures (Signed)
Patient Name: Micheal Carey  MRN: [id number]  Epilepsy Attending: Lora Havens  Referring Physician/Provider: Dr Jennet Maduro Date: 09/09/2018                                                                                 Duration: 26.01  Patient history: 68 year old male with history of cardiac arrest.  EEG to evaluate for seizures.  Level of alertness: Comatose  Technical aspects: This EEG study was done with scalp electrodes positioned according to the 10-20 International system of electrode placement. Electrical activity was acquired at a sampling rate of 500Hz  and reviewed with a high frequency filter of 70Hz  and a low frequency filter of 1Hz . EEG data were recorded continuously and digitally stored.   Description: EEG showed continuous diffuse background suppression.  It was reactive to noxious stimulation. Hyperventilation and photic stimulation were not performed.  IMPRESSION: This study is suggestive of profound severe encephalopathy. No seizures or epileptiform discharges were seen throughout the recording.  Micheal Carey

## 2018-09-09 NOTE — Consult Note (Addendum)
Neurology Consultation Reason for Consult: Unresponsive Referring Physician: Dr. Nelda Marseille  CC: unresponsive   History is obtained from:EMR  HPI: Micheal Carey is a 68 y.o. male with COPD, ETOH, Opiate abuse who presented on 8/9 post being found down in the parking lot in [place].  His total downtime is unknown. Patient was resuscitated for 10 minutes with 1 epi. He is hemodynamically stable but unresponsive. In ED patient was found to have an etoh level of 276 and opiates on uds. On arrival-CT head without any acute intracranial abnormalities and no acute cervical spine fractures seen on CT cervical spine.   Neurology consulted to evaluate acute anoxic encephalopathy.   ROS: Unable to obtain due to altered mental status.   Past Medical History:  Diagnosis Date  . CAP (community acquired pneumonia) 10/14/2015  . Cardiac arrest (Micheal Carey) 09/08/2018  . ETOH abuse   . Opioid abuse ([place])   . Pneumonia    "twice before" (10/14/2015)     Family History  Problem Relation Age of Onset  . Diabetes Mother   . Cancer Mother   . Diabetes Father   . Stroke Neg Hx   . CAD Neg Hx      Social History:  reports that he has quit smoking. He has never used smokeless tobacco. He reports current alcohol use. He reports current drug use. Drug: Oxycodone. Unable to ascertain due to patient's mentation  Exam: Current vital signs: BP (!) 147/67   Pulse 79   Temp 98.4 F (36.9 C)   Resp 12   Ht 5\' 8"  (1.727 m)   Wt 65.9 kg   SpO2 99%   BMI 22.09 kg/m  Vital signs in last 24 hours: Temp:  [96.4 F (35.8 C)-100 F (37.8 C)] 98.4 F (36.9 C) (08/10 1100) Pulse Rate:  [63-93] 79 (08/10 1111) Resp:  [12-24] 12 (08/10 1111) BP: (90-155)/(61-84) 147/67 (08/10 1111) SpO2:  [99 %-100 %] 99 % (08/10 1111) FiO2 (%):  [40 %-100 %] 40 % (08/10 1111) Weight:  [65.9 kg-68 kg] 65.9 kg (08/10 0253)  Physical Exam  Constitutional: Appears well-developed and well-nourished.  Mental Status: Patient does  not respond to verbal stimuli.  Does not respond to deep sternal rub.  Does not follow commands.  No verbalizations noted.  Cranial Nerves: II: patient does not respond confrontation bilaterally,  III,IV,VI: doll's response absent bilaterally. pupils right 3 mm, left 3 mm.  His eyes are forced down into the left V,VII: corneal reflex present bilaterally  IX,X: gag reflex present, XI: trapezius strength unable to test bilaterally XII: tongue strength unable to test Motor: Extremities flaccid throughout.  No spontaneous movement noted.  Sensory: Does not respond to noxious stimuli in any extremity. Deep Tendon Reflexes:  Absent throughout. Plantars: upgoing bilaterally Cerebellar: Unable to perform  I have reviewed labs in epic and the results pertinent to this consultation are:  BMP: na 140, k 3.6, bicarb 20, cr 0.84, ag 12 Mg 1.5, phos 1.6 Ph 7.405, pco2 36.7, po2 167, bicarb 23  I have reviewed the images obtained: CT head without any acute intracranial abnormalities and Ct spine without acute fracture   Impression:  Patient is a 68 year old man with past medical history of COPD, alcohol abuse opiate abuse presenting on 09/08/2018 after being found down in a parking lot in [place] with unknown prior downtime.  He was resuscitated for at least 10 minutes with 1 round of epi.  He was hemodynamically stable but remained unresponsive to verbal or noxious stimulation.  Neurological consultation obtained for possible anoxia/hypoxia. Patient's exam is very poor.  He has been off of sedation at least for an hour before this exam and did not have any meaningful responses. I suspect that he might have suffered severe anoxic/hypoxic injury and needs further investigation with EEG and MRI. #Acute anoxic encephalopathy   #acute respiratory failure secondary to persumed heroin overdose   #Post cardiac arrest  Recommendations: EEG MRI brain 48 to 72 hours post event Frequent neurological  monitoring  Will reassess in another 24 hrs for neurological response    Lorenso CourierVahini Chundi, MD Internal Medicine PGY3 Pager:[phone] 09/09/2018, 11:26 AM   Attending Neurohospitalist Addendum Patient seen and examined with APP/Resident. Agree with the history and physical as documented above. Agree with the plan as documented, which I helped formulate. I have independently reviewed the chart, obtained history, review of systems and examined the patient.I have personally reviewed pertinent head/neck/spine imaging (CT/MRI). Please feel free to call with any questions. --- Milon DikesAshish Treniece Holsclaw, MD [facility] Pager: [phone]  If 7pm to 7am, please call on call as listed on AMION.

## 2018-09-09 NOTE — Progress Notes (Signed)
Patient gagging on ETT. Propofol started and prn Fentanyl given. Patient now resting in bed comfortably. Will continue to monitor.

## 2018-09-09 NOTE — Progress Notes (Signed)
CSW spoke with patient's son Marlowe Aschoff at [phone] to obtain updated contact and POA information. Marlowe Aschoff reports that his father is married to [employer], however the couple is currently separated and there is a 50B protection order in place. Marlowe Aschoff reports that he and his sister Leonidas live in [place]. Marlowe Aschoff reports that his father does not have a POA. 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 Nelda Marseille attempted to contact him. CSW provided Jud with a brief summary as to why his father was at [facility]. Marlowe Aschoff reports that his sister Chriss Czar would call CSW for an update as well.  CSW spoke with Chyvonne to provide her with an update regarding her father's current condition. Chriss Czar is requesting a call from the medical staff.  Madilyn Fireman, MSW, LCSW-A Clinical Social Worker [facility] [phone]

## 2018-09-09 NOTE — Progress Notes (Signed)
Assisted tele visit to patient with daughter.  Obie Silos Samson, RN  

## 2018-09-09 NOTE — Progress Notes (Signed)
NAME:  Micheal Carey, MRN:  [id number], DOB:  04-Dec-1950, LOS: 1 ADMISSION DATE:  09/08/2018, CONSULTATION DATE:  09/08/2018 REFERRING MD:  EDP Silverio Lay- Yao, CHIEF COMPLAINT:  Cardiac arrest and acute respiratory failure   Brief History   68 year old male with history of etoh and opiate abuse who was found down in the parking lot where he was in [place].  Resuscitated after 10 minutes with 1 epi and became hemodynamically stable but remained completely unresponsive.  PCCM was called on consultation.  No further history is available at this point.  Utox positive with opiates and etoh positive.    History of present illness   68 year old male with history of etoh and opiate abuse who was found down in the parking lot where he was in [place].  Resuscitated after 10 minutes with 1 epi and became hemodynamically stable but remained completely unresponsive.  PCCM was called on consultation.  No further history is available at this point.  Utox positive with opiates and etoh positive.    Past Medical History  Substance abuse  Significant Hospital Events   CPR 8/9>>>  Consults:  PCCM  Procedures:  ETT 8/9>>>  Significant Diagnostic Tests:  Head CT 8/9>>> CXR 8/9>>>I reviewed myself, ETT is in a good place  Micro Data:  Blood 8/9>>> Urine 8/9>>> Sputum 8/9>>>  Antimicrobials:  N/A   Interim history/subjective:  No events overnight, failed weaning today  Objective   Blood pressure (!) 147/68, pulse 83, temperature 99.5 F (37.5 C), resp. rate 13, height 5\' 8"  (1.727 m), weight 65.9 kg, SpO2 99 %.    Vent Mode: PRVC FiO2 (%):  [40 %-100 %] 40 % Set Rate:  [12 bmp-16 bmp] 12 bmp Vt Set:  [560 mL] 560 mL PEEP:  [5 cmH20] 5 cmH20 Pressure Support:  [10 cmH20] 10 cmH20 Plateau Pressure:  [17 cmH20-23 cmH20] 23 cmH20   Intake/Output Summary (Last 24 hours) at 09/09/2018 0956 Last data filed at 09/09/2018 0900 Gross per 24 hour  Intake 3111.64 ml  Output 2150 ml  Net 961.64 ml   Filed Weights   09/08/18 1608 09/08/18 2042 09/09/18 0253  Weight: 68 kg 65.9 kg 65.9 kg    Examination: General: Acutely ill appearing male, NAD HENT: [place]/AT, PERRL, EOM-I and MMM, ETT in place Lungs: CTA bilaterally Cardiovascular: RRR, Nl S1/S2 and -M/R/G Abdomen: Soft, NT, ND and +BS Extremities: -edema and -tenderness Neuro: Unresponsive, gag present, no corneal, no pupillary, no dolls eyes and no respiratory drive Skin: Intact  I reviewed CXR myself, ETT is in a good position  Resolved Hospital Problem list   N/A  Assessment & Plan:  68 year old male with substance abuse history that likely overdosed and suffered a cardiac arrest.  Discussed with EDP and PCCM-MD.  Cardiac arrest: likely due to respiratory arrest from overdose  - Tele monitoring  - D/C levophed  - KVO IVF and start TF  - No hypothermia given unknown downtime and unknown head CT status  Acute respiratory failure likely due to heroine overdose  - Begin PS trials but no extubation given neuro status  - Adjust vent for ABG  - Titrate O2 for sat of 88-92%  - PRN albuterol  Acute anoxic encephalopathy  - Head CT negative  - EEG pending  - Neuro consult called  - No hypothermia due to unknown downtime and very poor neurologic exam  - D/C sedation  - Monitor  AKI:  - Replace K, Phos and Mg  -  Recheck in AM  - KVO IVF  FEN:  - Nutrition for TF  - Protonix PO  - SCD's  Attempted to call son for goals of care discussion but went to voice mail  Labs   CBC: Recent Labs  Lab 09/08/18 1619 09/08/18 1700 09/08/18 1953 09/08/18 2037 09/09/18 0313 09/09/18 0427  WBC 10.5  --   --  12.0* 11.4*  --   NEUTROABS 5.9  --   --  8.7*  --   --   HGB 13.1 11.6* 12.2* 12.3* 12.1* 11.9*  HCT 39.4 34.0* 36.0* 36.5* 35.7* 35.0*  MCV 86.2  --   --  85.5 84.4  --   PLT 261  --   --  241 227  --     Basic Metabolic Panel: Recent Labs  Lab 09/08/18 1619 09/08/18 1700 09/08/18 1953 09/08/18 2037 09/09/18 0313 09/09/18  0427  NA 140 144 144 141 140 144  K 3.1* 3.1* 3.3* 3.3* 3.6 3.6  CL 106  --   --  108 108  --   CO2 20*  --   --  21* 20*  --   GLUCOSE 230*  --   --  134* 112*  --   BUN 5*  --   --  6* 5*  --   CREATININE 1.40*  --   --  1.09 0.84  --   CALCIUM 8.1*  --   --  7.8* 7.9*  --   MG  --   --   --  1.7 1.5*  --   PHOS  --   --   --  2.0* 1.6*  --    GFR: Estimated Creatinine Clearance: 81.7 mL/min (by C-G formula based on SCr of 0.84 mg/dL). Recent Labs  Lab 09/08/18 1619 09/08/18 1621 09/08/18 1645 09/08/18 2037 09/08/18 2353 09/09/18 0313  PROCALCITON  --   --   --  0.12  --   --   WBC 10.5  --   --  12.0*  --  11.4*  LATICACIDVEN  --  5.7* 4.3* 3.7* 2.6*  --     Liver Function Tests: Recent Labs  Lab 09/08/18 1619 09/08/18 2037  AST 86* 97*  ALT 33 32  ALKPHOS 89 70  BILITOT 0.4 1.0  PROT 7.0 6.4*  ALBUMIN 3.7 3.5   No results for input(s): LIPASE, AMYLASE in the last 168 hours. No results for input(s): AMMONIA in the last 168 hours.  ABG    Component Value Date/Time   PHART 7.405 09/09/2018 0427   PCO2ART 36.7 09/09/2018 0427   PO2ART 167.0 (H) 09/09/2018 0427   HCO3 23.0 09/09/2018 0427   TCO2 24 09/09/2018 0427   ACIDBASEDEF 1.0 09/09/2018 0427   O2SAT 100.0 09/09/2018 0427     Coagulation Profile: Recent Labs  Lab 09/08/18 2037  INR 1.4*    Cardiac Enzymes: No results for input(s): CKTOTAL, CKMB, CKMBINDEX, TROPONINI in the last 168 hours.  HbA1C: Hgb A1c MFr Bld  Date/Time Value Ref Range Status  10/15/2015 09:06 AM 4.7 (L) 4.8 - 5.6 % Final    Comment:    (NOTE)         Pre-diabetes: 5.7 - 6.4         Diabetes: >6.4         Glycemic control for adults with diabetes: <7.0   10/14/2015 07:01 PM 4.9 4.8 - 5.6 % Final    Comment:    (NOTE)  Pre-diabetes: 5.7 - 6.4         Diabetes: >6.4         Glycemic control for adults with diabetes: <7.0     CBG: Recent Labs  Lab 09/08/18 2045 09/08/18 2350 09/09/18 0340 09/09/18  0751  GLUCAP 131* 113* 102* 137*   The patient is critically ill with multiple organ systems failure and requires high complexity decision making for assessment and support, frequent evaluation and titration of therapies, application of advanced monitoring technologies and extensive interpretation of multiple databases.   Critical Care Time devoted to patient care services described in this note is  32  Minutes. This time reflects time of care of this signee Dr Jennet Maduro. This critical care time does not reflect procedure time, or teaching time or supervisory time of PA/NP/Med student/Med Resident etc but could involve care discussion time.  Rush Farmer, M.D. [facility] Pulmonary/Critical Care Medicine. Pager: [phone]. After hours pager: [phone].

## 2018-09-10 ENCOUNTER — Inpatient Hospital Stay (HOSPITAL_COMMUNITY): Payer: Medicare Other

## 2018-09-10 LAB — BASIC METABOLIC PANEL
Anion gap: 11 (ref 5–15)
BUN: 5 mg/dL — ABNORMAL LOW (ref 8–23)
CO2: 22 mmol/L (ref 22–32)
Calcium: 8.4 mg/dL — ABNORMAL LOW (ref 8.9–10.3)
Chloride: 106 mmol/L (ref 98–111)
Creatinine, Ser: 0.88 mg/dL (ref 0.61–1.24)
GFR calc Af Amer: >60 mL/min (ref >60)
GFR calc non Af Amer: >60 mL/min (ref >60)
Glucose, Bld: 118 mg/dL — ABNORMAL HIGH (ref 70–99)
Potassium: 3.2 mmol/L — ABNORMAL LOW (ref 3.5–5.1)
Sodium: 139 mmol/L (ref 135–145)

## 2018-09-10 LAB — CBC
HCT: 38.7 % — ABNORMAL LOW (ref 39.0–52.0)
Hemoglobin: 13.2 g/dL (ref 13.0–17.0)
MCH: 28.8 pg (ref 26.0–34.0)
MCHC: 34.1 g/dL (ref 30.0–36.0)
MCV: 84.5 fL (ref 80.0–100.0)
Platelets: 223 10*3/uL (ref 150–400)
RBC: 4.58 MIL/uL (ref 4.22–5.81)
RDW: 12.4 % (ref 11.5–15.5)
WBC: 12.1 10*3/uL — ABNORMAL HIGH (ref 4.0–10.5)
nRBC: 0 % (ref 0.0–0.2)

## 2018-09-10 LAB — POCT I-STAT 7, (LYTES, BLD GAS, ICA,H+H)
Acid-Base Excess: 3 mmol/L — ABNORMAL HIGH (ref 0.0–2.0)
Bicarbonate: 25.7 mmol/L (ref 20.0–28.0)
Calcium, Ion: 1.19 mmol/L (ref 1.15–1.40)
HCT: 40 % (ref 39.0–52.0)
Hemoglobin: 13.6 g/dL (ref 13.0–17.0)
O2 Saturation: 100 %
Patient temperature: 98.6
Potassium: 3.2 mmol/L — ABNORMAL LOW (ref 3.5–5.1)
Sodium: 138 mmol/L (ref 135–145)
TCO2: 27 mmol/L (ref 22–32)
pCO2 arterial: 32.5 mmHg (ref 32.0–48.0)
pH, Arterial: 7.506 — ABNORMAL HIGH (ref 7.350–7.450)
pO2, Arterial: 177 mmHg — ABNORMAL HIGH (ref 83.0–108.0)

## 2018-09-10 LAB — LACTIC ACID, PLASMA: Lactic Acid, Venous: 1.3 mmol/L (ref 0.5–1.9)

## 2018-09-10 LAB — TRIGLYCERIDES: Triglycerides: 173 mg/dL — ABNORMAL HIGH (ref <150)

## 2018-09-10 LAB — MRSA CULTURE: Culture: NOT DETECTED

## 2018-09-10 LAB — GLUCOSE, CAPILLARY
Glucose-Capillary: 105 mg/dL — ABNORMAL HIGH (ref 70–99)
Glucose-Capillary: 103 mg/dL — ABNORMAL HIGH (ref 70–99)
Glucose-Capillary: 137 mg/dL — ABNORMAL HIGH (ref 70–99)
Glucose-Capillary: 132 mg/dL — ABNORMAL HIGH (ref 70–99)
Glucose-Capillary: 100 mg/dL — ABNORMAL HIGH (ref 70–99)

## 2018-09-10 LAB — PHOSPHORUS: Phosphorus: 1.9 mg/dL — ABNORMAL LOW (ref 2.5–4.6)

## 2018-09-10 LAB — MAGNESIUM: Magnesium: 2.2 mg/dL (ref 1.7–2.4)

## 2018-09-10 MED ORDER — CHLORHEXIDINE GLUCONATE 0.12 % MT SOLN
OROMUCOSAL | Status: AC
  Filled 2018-09-10: qty 15

## 2018-09-10 MED ORDER — POTASSIUM CHLORIDE 10 MEQ/100ML IV SOLN
10.0000 meq | INTRAVENOUS | Status: AC
  Administered 2018-09-10 (×4): 10 meq via INTRAVENOUS
  Filled 2018-09-10 (×4): qty 100

## 2018-09-10 MED ORDER — POTASSIUM PHOSPHATES 15 MMOLE/5ML IV SOLN
20.0000 mmol | Freq: Once | INTRAVENOUS | Status: AC
  Administered 2018-09-10: 12:00:00 20 mmol via INTRAVENOUS
  Filled 2018-09-10: qty 6.67

## 2018-09-10 NOTE — Progress Notes (Signed)
Advanced ETT from 24 to 26 at the lip per order. RN assisted at bedside.

## 2018-09-10 NOTE — Progress Notes (Signed)
[facility] ADULT ICU REPLACEMENT PROTOCOL FOR AM LAB REPLACEMENT ONLY  The patient does apply for the [facility] Adult ICU Electrolyte Replacment Protocol based on the criteria listed below:   1. Is GFR >/= 40 ml/min? Yes.    Patient's GFR today is >60 2. Is urine output >/= 0.5 ml/kg/hr for the last 6 hours? Yes.   Patient's UOP is 1 ml/kg/hr 3. Is BUN < 60 mg/dL? Yes.    Patient's BUN today is 5 4. Abnormal electrolyte(s): K-3.2 5. Ordered repletion with: per protocol 6. If a panic level lab has been reported, has the CCM MD in charge been notified? Yes.  .   Physician:  Dr. Terrill Mohr, Philis Nettle 09/10/2018 6:07 AM

## 2018-09-10 NOTE — Progress Notes (Addendum)
Subjective: Patient is not interactive, not alert, and on a vent.  Exam: Vitals:   09/10/18 0735 09/10/18 0800  BP:  (!) 149/77  Pulse:  91  Resp:  18  Temp: (!) 100.5 F (38.1 C)   SpO2:  98%   Gen: In bed, NAD Resp: non-labored breathing, no acute distress, on ventilator Abd: soft, nt  Mental Status: Patient does not respond to verbal stimuli. Does not follow commands.  No verbalizations noted.   Cranial Nerves: II: patient does not respond confrontation bilaterally  III,IV,VI:  pupils right 3 mm, left 3 mm V,VII: corneal reflex present bilaterally  VIII: patient does not respond to verbal stimuli IX,X: gag reflex present XI: trapezius strength unable to test bilaterally XII: tongue strength unable to test  Motor: Extremities flaccid throughout.  Some spontaneous movement noted.    No purposeful movements noted.  Sensory: Responds to noxious stimuli in lower extremities  Deep Tendon Reflexes:  Absent throughout.  Plantars: upgoing bilaterally  Cerebellar: Unable to perform  Pertinent Labs:  BMP: na 139, k 3.2, bicarb 22, cr 0.88, ag 11 CBC: wbc 12.1, hb 13.2, hct 38.7, plt 223 PH 7.506, pco2 32.5, po2 177, o2 sat 100  Impression:  Micheal Carey is a 68 y.o m with copd, etoh and opiate abuse who presented post a pea arrest. He is being followed by neurology due to acute encephalopathy likely due to anoxia/hypoxia. Patient's brainstem reflexes intact and his neurological status continues to improve.   #Acute anoxic encephalopathy  -EEG showed profound severe encephalopathy without seizure or epileptiform discharges  #Acute respiratory failure secondary to presumed heroin overdose   #Post cardiac arrest   Recommendations:  -continue to monitor neurovascular status closely -Will do repeat MRI tomorrow 8/12  Lars Mage, MD Internal Medicine PGY3 Pager:[phone] 09/10/2018, 9:21 AM    Attending addendum Patient seen and examined On  examination,Has weak pupillary reflexes bilaterally, has corneal reflexes bilaterally, has cough and gag.  Remains flaccid throughout but noted some bilateral arm flexion spontaneously and in response to suctioning his endotracheal tube. On lower extremity noxious stimulation, he triple flexes. EEG done yesterday showed diffuse background suppression, which was reactive to noxious stimulation suggestive of profound severe encephalopathy.  No seizure seen.  Post cardiac arrest-likely acute anoxic/hypoxic encephalopathy.  Recommendations as above  We will follow with you  -- Amie Portland, MD [facility] Pager: [phone] If 7pm to 7am, please call on call as listed on AMION.

## 2018-09-10 NOTE — Progress Notes (Signed)
NAME:  Micheal Carey, MRN:  [id number], DOB:  [birth year], LOS: 2 ADMISSION DATE:  09/08/2018, CONSULTATION DATE:  09/08/2018 REFERRING MD:  EDP Silverio Lay- Yao, CHIEF COMPLAINT:  Cardiac arrest and acute respiratory failure   Brief History   Micheal Carey is a 68 year old male with PMH of COPD, ETOH and opiate abuse who was found down in the parking lot with PEA and now s/p CPR and intubation and admitted for acute respiratory failure with hypoxemia.  History of present illness   68 year old male with history of ETOH and opiate abuse who was found down in the parking lot where he was found unresponsive by bystanders. Cardiac rhythm showed asystole first then PEA. CPR was done for 10 minutes with 1 epi and he pulses returned. Unknown down time before CPR. He became hemodynamically stable but remained completely unresponsive. Patient had a palpable pulse on arrival with some spontaneous movements but did not follow directions. Upon review of his chart, it was found that patient has hx of drug and alcohol problems. Patient was intubated in the ED. His alcohol level was 276 and UDS + for opiates. COVID negative. Suspect drug overdose as the cause of AMS. PCCM was called on consultation.   Past Medical History  Substance abuse, COPD  Significant Hospital Events   CPR 8/9>>> Intubation 8/9 >>>  Consults:  PCCM Neurology  Procedures:  ETT 8/9>>>  Significant Diagnostic Tests:  CXR 8/9 >>> Low lung volume, no acute cardiopulmonary dz, ETT w/ tip 4.8 cm above carina Head CT 8/9 >>> No acute intracranial abnormality. CT C-spine >>> No acute cervical spine fracture. CXR 8/9 >>> I reviewed myself, ETT is in a good place CXR 8/10 >>> No acute cardiopulmonary abnormality. ETT stable and in appropriate location EEG 8/10 >>> Suggestive of profound severe encephalopathy. No seizures or epileptiform discharges  TTE ECHO 8/10 >>> Normal LVSF, EF 60-65%, mild MR CXR 8/11 >>> ET in stable position. NGT slightly  withdrawn, Suggested advancement of ~10 cm. No acute cardiopulmonary disease.  Micro Data:  SARS coronavirus 2 8/9 >>> Negative MRSA PCR 8/9 >>> Negative Blood 8/9>>> No growth in 24 hr Urine 8/9>>> No growth  Antimicrobials:  N/A   Interim history/subjective:  Patient had no acute events overnight. Per nurse, pt mildly contracted overnight but no change in mental status. Intermittently gagged on tube so propofol restarted at a low dose. This AM, CXR shows NGT slightly withdrawn to the level of the GE junction (suggested 10 cm advancement). Neuro saw patient this AM and recommending MRI tomorrow to assess for anoxic brain changes. Patient moved all extremities, had corneal reflex and mild pupillary reaction.   Objective   Blood pressure (!) 149/77, pulse 91, temperature (!) 100.5 F (38.1 C), temperature source Axillary, resp. rate 18, height 5\' 8"  (1.727 m), weight 65.4 kg, SpO2 98 %.    Vent Mode: PSV;CPAP FiO2 (%):  [40 %] 40 % Set Rate:  [12 bmp] 12 bmp Vt Set:  [560 mL] 560 mL PEEP:  [5 cmH20] 5 cmH20 Pressure Support:  [10 cmH20] 10 cmH20 Plateau Pressure:  [15 cmH20-21 cmH20] 15 cmH20   Intake/Output Summary (Last 24 hours) at 09/10/2018 0909 Last data filed at 09/10/2018 0800 Gross per 24 hour  Intake 1038.81 ml  Output 2420 ml  Net -1381.19 ml   Filed Weights   09/08/18 2042 09/09/18 0253 09/10/18 0329  Weight: 65.9 kg 65.9 kg 65.4 kg    Examination: General: Acutely ill appearing male,  unresponsive. NAD HENT: [place]/AT, PERRL, ETT in place Lungs: CTAB, no increased WOB. No wheezing Cardiovascular: RRR, Nl S1/S2, no m/r/g Abdomen: Soft, NT, ND and +BS Extremities: Well-perfused. Distal pulses 2+. No LE edema Neuro: On vent, obtuse, gag present, pupils sluggish reaction to light, does not follow commands, moves all extremities. Corneal reflex in touch. Slightly contracted. Positive triple reflexion Skin: Warm. Well-perfused  Resolved Hospital Problem list   N/A   Assessment & Plan:  68 year old male with hx of COPD and substance abuse found in cardiac arrest likely 2/2 to drug overdose, now s/p resuscitation and intubation on vent management w/ acute respiratory failure and altered mental status.  Cardiac arrest: likely secondary to respiratory arrest from overdose - Continue tele monitoring - Continue levophed for BP support  - Continue IVF @ 75 mL/hr  Acute respiratory failure likely due to heroine overdose - Weaning on ZO:XWRUPS:CPAP FiO2 40, Peep 5, Pressure support 10, RR 20-23 - ABG w/ pH 7.506, pCO2 32.5, pO2 177 <- 167, normal HCO3 - Recheck ABG and consider decreasing FiO2 to decrease pO2 or TV to increase CO2 - Titrate O2 for sat of 88-92% - Wean of as tolerated - PRN albuterol  Acute anoxic encephalopathy - Head CT w/ no acute intracranial abnormality - EEG 8/10 suggestive of profound severe encephalopathy. No seizures or epileptiform discharges  - Neuro consult, appreciate recs - On propofol 25 mcg/kg/min and prn fentanyl - Brain MRI tomorrow AM - Wean as tolerated  AKI: Improved - Creatine of 0.88 - K down to 3.2, continue IV K+ - Mg improved to 2.2, monitor - Continue NS 75 ml/hr - Renal status improving, no need for renal U/S at the moment  FEN: - Nutrition for TF - IV Protonix - SCD's - Social work to get family for plan of care - Bedrest  Labs   CBC: Recent Labs  Lab 09/08/18 1619  09/08/18 2037 09/09/18 0313 09/09/18 0427 09/10/18 0359 09/10/18 0402  WBC 10.5  --  12.0* 11.4*  --   --  12.1*  NEUTROABS 5.9  --  8.7*  --   --   --   --   HGB 13.1   < > 12.3* 12.1* 11.9* 13.6 13.2  HCT 39.4   < > 36.5* 35.7* 35.0* 40.0 38.7*  MCV 86.2  --  85.5 84.4  --   --  84.5  PLT 261  --  241 227  --   --  223   < > = values in this interval not displayed.    Basic Metabolic Panel: Recent Labs  Lab 09/08/18 1619  09/08/18 2037 09/09/18 0313 09/09/18 0427 09/10/18 0359 09/10/18 0402  NA 140   < > 141 140 144 138  139  K 3.1*   < > 3.3* 3.6 3.6 3.2* 3.2*  CL 106  --  108 108  --   --  106  CO2 20*  --  21* 20*  --   --  22  GLUCOSE 230*  --  134* 112*  --   --  118*  BUN 5*  --  6* 5*  --   --  5*  CREATININE 1.40*  --  1.09 0.84  --   --  0.88  CALCIUM 8.1*  --  7.8* 7.9*  --   --  8.4*  MG  --   --  1.7 1.5*  --   --  2.2  PHOS  --   --  2.0* 1.6*  --   --  1.9*   < > = values in this interval not displayed.   GFR: Estimated Creatinine Clearance: 77.4 mL/min (by C-G formula based on SCr of 0.88 mg/dL). Recent Labs  Lab 09/08/18 1619 09/08/18 1621 09/08/18 1645 09/08/18 2037 09/08/18 2353 09/09/18 0313 09/10/18 0402  PROCALCITON  --   --   --  0.12  --   --   --   WBC 10.5  --   --  12.0*  --  11.4* 12.1*  LATICACIDVEN  --  5.7* 4.3* 3.7* 2.6*  --   --     Liver Function Tests: Recent Labs  Lab 09/08/18 1619 09/08/18 2037  AST 86* 97*  ALT 33 32  ALKPHOS 89 70  BILITOT 0.4 1.0  PROT 7.0 6.4*  ALBUMIN 3.7 3.5   No results for input(s): LIPASE, AMYLASE in the last 168 hours. No results for input(s): AMMONIA in the last 168 hours.  ABG    Component Value Date/Time   PHART 7.506 (H) 09/10/2018 0359   PCO2ART 32.5 09/10/2018 0359   PO2ART 177.0 (H) 09/10/2018 0359   HCO3 25.7 09/10/2018 0359   TCO2 27 09/10/2018 0359   ACIDBASEDEF 1.0 09/09/2018 0427   O2SAT 100.0 09/10/2018 0359     Coagulation Profile: Recent Labs  Lab 09/08/18 2037  INR 1.4*    Cardiac Enzymes: No results for input(s): CKTOTAL, CKMB, CKMBINDEX, TROPONINI in the last 168 hours.  HbA1C: Hgb A1c MFr Bld  Date/Time Value Ref Range Status  10/15/2015 09:06 AM 4.7 (L) 4.8 - 5.6 % Final    Comment:    (NOTE)         Pre-diabetes: 5.7 - 6.4         Diabetes: >6.4         Glycemic control for adults with diabetes: <7.0   10/14/2015 07:01 PM 4.9 4.8 - 5.6 % Final    Comment:    (NOTE)         Pre-diabetes: 5.7 - 6.4         Diabetes: >6.4         Glycemic control for adults with diabetes:  <7.0     CBG: Recent Labs  Lab 09/09/18 1537 09/09/18 1926 09/09/18 2339 09/10/18 0324 09/10/18 0730  GLUCAP 147* 110* 124* 105* 103*     Linwood Dibbles, MS4

## 2018-09-10 NOTE — Progress Notes (Signed)
Daughter Micheal Carey called and updated about her father's condition. She also stated that only her, Micheal Carey, and Micheal Carey (Daughter-in-law) are the only individuals that should be given any information about the condition of Micheal Carey.

## 2018-09-10 NOTE — Progress Notes (Signed)
CSW received information that the patient's wife (POA) passed away last night at [facility]. CSW spoke with the patient's son Marlowe Aschoff to obtain information about which of his siblings was the oldest to have a designated Media planner. Marlowe Aschoff confirms that he is the oldest child of the patient. Marlowe Aschoff reports he is currently in route to [employer] from [place] and expects to arrive today at approximately 5pm. CSW informed Marlowe Aschoff that the medical team would be in contact with him for discussion and he is agreeable.  CSW will continue following.  Madilyn Fireman, MSW, LCSW-A Clinical Social Worker [facility] [phone]

## 2018-09-11 ENCOUNTER — Inpatient Hospital Stay (HOSPITAL_COMMUNITY): Payer: Medicare Other

## 2018-09-11 LAB — BASIC METABOLIC PANEL
Anion gap: 13 (ref 5–15)
BUN: 13 mg/dL (ref 8–23)
CO2: 20 mmol/L — ABNORMAL LOW (ref 22–32)
Calcium: 8.5 mg/dL — ABNORMAL LOW (ref 8.9–10.3)
Chloride: 107 mmol/L (ref 98–111)
Creatinine, Ser: 1.29 mg/dL — ABNORMAL HIGH (ref 0.61–1.24)
GFR calc Af Amer: >60 mL/min (ref >60)
GFR calc non Af Amer: 58 mL/min — ABNORMAL LOW (ref >60)
Glucose, Bld: 108 mg/dL — ABNORMAL HIGH (ref 70–99)
Potassium: 4 mmol/L (ref 3.5–5.1)
Sodium: 140 mmol/L (ref 135–145)

## 2018-09-11 LAB — GLUCOSE, CAPILLARY
Glucose-Capillary: 102 mg/dL — ABNORMAL HIGH (ref 70–99)
Glucose-Capillary: 104 mg/dL — ABNORMAL HIGH (ref 70–99)
Glucose-Capillary: 105 mg/dL — ABNORMAL HIGH (ref 70–99)
Glucose-Capillary: 105 mg/dL — ABNORMAL HIGH (ref 70–99)
Glucose-Capillary: 123 mg/dL — ABNORMAL HIGH (ref 70–99)
Glucose-Capillary: 94 mg/dL (ref 70–99)
Glucose-Capillary: 98 mg/dL (ref 70–99)

## 2018-09-11 LAB — MRSA PCR SCREENING

## 2018-09-11 MED ORDER — PIPERACILLIN-TAZOBACTAM 3.375 G IVPB
3.3750 g | Freq: Three times a day (TID) | INTRAVENOUS | Status: DC
  Administered 2018-09-11 – 2018-09-13 (×7): 3.375 g via INTRAVENOUS
  Filled 2018-09-11 (×8): qty 50

## 2018-09-11 MED ORDER — PIPERACILLIN-TAZOBACTAM 3.375 G IVPB 30 MIN
3.3750 g | Freq: Once | INTRAVENOUS | Status: AC
  Administered 2018-09-11: 3.375 g via INTRAVENOUS
  Filled 2018-09-11: qty 50

## 2018-09-11 MED ORDER — VANCOMYCIN HCL 10 G IV SOLR
1500.0000 mg | Freq: Once | INTRAVENOUS | Status: AC
  Administered 2018-09-11: 03:00:00 1500 mg via INTRAVENOUS
  Filled 2018-09-11: qty 1500

## 2018-09-11 MED ORDER — VANCOMYCIN HCL IN DEXTROSE 1-5 GM/200ML-% IV SOLN
1000.0000 mg | INTRAVENOUS | Status: DC
  Administered 2018-09-12 (×2): 1000 mg via INTRAVENOUS
  Filled 2018-09-11 (×2): qty 200

## 2018-09-11 MED ORDER — VANCOMYCIN HCL 10 G IV SOLR: 1250.0000 mg | INTRAVENOUS | Status: DC

## 2018-09-11 MED ORDER — IBUPROFEN 100 MG/5ML PO SUSP
400.0000 mg | Freq: Three times a day (TID) | ORAL | Status: DC | PRN
  Filled 2018-09-11: qty 20

## 2018-09-11 NOTE — Procedures (Signed)
Patient Name: Win Guajardo  MRN: [id number]  Epilepsy Attending: Lora Havens  Referring Physician/Provider: Dr Amie Portland                                                  Duration: 26.01  Patient history: 68 year old male with history of cardiac arrest.  EEG to evaluate for seizures.  Level of alertness: Comatose  Technical aspects: This EEG study was done with scalp electrodes positioned according to the 10-20 International system of electrode placement. Electrical activity was acquired at a sampling rate of 500Hz  and reviewed with a high frequency filter of 70Hz  and a low frequency filter of 1Hz . EEG data were recorded continuously and digitally stored.   Description: EEG showed generalized 8-9hz  alpha frequency intermixed with 13-15hz  beta frequency.  It was not reactive to noxious stimulation. Ventilator related artifact was also noted. Hyperventilation and photic stimulation were not performed.   IMPRESSION: This study is suggestive of profound severe encephalopathy. No seizures or clear epileptiform discharges were seen throughout the recording.      Eleanna Theilen Barbra Sarks

## 2018-09-11 NOTE — Progress Notes (Signed)
MRI of the brain completed. Changes on DWI and FLAIR suggestive of hypoxic ischemic injury. Exam remains poor. EEG repeated today.  Shows evidence of profound encephalopathy and there was no reaction to noxious stimulation.  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 Over 42 hours after the event, with no change in exam and the EEG and MRI showing the changes described above, I do not anticipate him progressing towards a neurological meaningful recovery. I did discuss these findings in detail with son and daughter at the patient's bedside this evening.  I have asked him to consider further steps and goals of care with the information provided.  They will be returning back tomorrow and will have discussions with the critical care team on steps going forward.  I will be available to follow with you.  -- Amie Portland, MD [facility] Pager: [phone] If 7pm to 7am, please call on call as listed on AMION.

## 2018-09-11 NOTE — Progress Notes (Signed)
Pt was transported to and from MRI by RT with the ventilator. Pt was suctioned prior to leaving the unit and there were no complications during transport or the procedure. Pt is now back in room 2M07 and is resting comfortably. Will continue to monitor.

## 2018-09-11 NOTE — Progress Notes (Signed)
[facility] Progress Note Patient Name: Micheal Carey DOB: [birth year] MRN: [id number]   Date of Service  09/11/2018  HPI/Events of Note  Fever to 102.7 - Request for Tylenol. AST = 97. Therefore, can't use Tylenol. Creatinine = 0.88. Not on Abx Rx.  eICU Interventions  Will order: 1. Blood cultures X 2 now. 2. Tracheal aspirate culture now. 3. Vancomycin and Zosyn per pharmacy consult.  4. Motrin suspension 400 mg per tube Q 8 hours PRN Temp > 100.5 F.        Rayleen Wyrick Cornelia Copa 09/11/2018, 12:34 AM

## 2018-09-11 NOTE — Progress Notes (Signed)
Pt unavailable at this time due to going to MRI - will check back as schedule permits.

## 2018-09-11 NOTE — Progress Notes (Signed)
NAME:  Micheal Carey, MRN:  [id number], DOB:  10-17-1950, LOS: 3 ADMISSION DATE:  09/08/2018, CONSULTATION DATE:  09/08/2018 REFERRING MD:  EDP Darl Householder, CHIEF COMPLAINT:  Cardiac arrest and acute respiratory failure   Brief History   Mr. Micheal Carey is a 68 year old male with PMH of COPD, ETOH and opiate abuse who was found down in the parking lot with PEA and now s/p CPR and intubation and admitted for acute respiratory failure with hypoxemia.  History of present illness   68 year old male with history of ETOH and opiate abuse who was found down in the parking lot where he was found unresponsive by bystanders. Cardiac rhythm showed asystole first then PEA. CPR was done for 10 minutes with 1 epi and he pulses returned. Unknown down time before CPR. He became hemodynamically stable but remained completely unresponsive. Patient had a palpable pulse on arrival with some spontaneous movements but did not follow directions. Upon review of his chart, it was found that patient has hx of drug and alcohol problems. Patient was intubated in the ED. His alcohol level was 276 and UDS + for opiates. COVID negative. Suspect drug overdose as the cause of AMS. PCCM was called on consultation.   Past Medical History  Substance abuse, COPD  Significant Hospital Events   CPR 8/9>>> Intubation 8/9 >>>  Consults:  PCCM Neurology  Procedures:  ETT 8/9>>>  Significant Diagnostic Tests:  CXR 8/9 >>> Low lung volume, no acute cardiopulmonary dz, ETT w/ tip 4.8 cm above carina Head CT 8/9 >>> No acute intracranial abnormality. CT C-spine >>> No acute cervical spine fracture. CXR 8/9 >>> I reviewed myself, ETT is in a good place CXR 8/10 >>> No acute cardiopulmonary abnormality. ETT stable and in appropriate location EEG 8/10 >>> Suggestive of profound severe encephalopathy. No seizures or epileptiform discharges  TTE ECHO 8/10 >>> Normal LVSF, EF 60-65%, mild MR CXR 8/11 >>> ET in stable position. NGT slightly  withdrawn, Suggested advancement of ~10 cm. No acute cardiopulmonary disease.  Micro Data:  SARS coronavirus 2 8/9 >>> Negative MRSA PCR 8/9 >>> Negative Blood 8/9>>> No growth in 24 hr Urine 8/9>>> No growth  Antimicrobials:  N/A   Interim history/subjective:  Patient spiked a fever to 102.7 overnight. Was not given tylenol due to elevated LFTs. Given Advil. Blood cultures sent and pt started on zosyn and vanc. No significant change in mental status. Continues to be on sedation (propofol 20). Getting MRI and EEG today, per neuro  Objective   Blood pressure 138/83, pulse 100, temperature 99.6 F (37.6 C), temperature source Oral, resp. rate 16, height 5\' 8"  (1.727 m), weight 65 kg, SpO2 99 %.    Vent Mode: PRVC FiO2 (%):  [40 %] 40 % Set Rate:  [12 bmp] 12 bmp Vt Set:  [560 mL] 560 mL PEEP:  [5 cmH20] 5 cmH20 Pressure Support:  [10 cmH20] 10 cmH20 Plateau Pressure:  [15 cmH20-27 cmH20] 26 cmH20   Intake/Output Summary (Last 24 hours) at 09/11/2018 0834 Last data filed at 09/11/2018 0700 Gross per 24 hour  Intake 1863.32 ml  Output 1175 ml  Net 688.32 ml   Filed Weights   09/09/18 0253 09/10/18 0329 09/11/18 0500  Weight: 65.9 kg 65.4 kg 65 kg    Examination: General: Acutely ill appearing male, unresponsive. NAD HENT: Liberal/AT, PERRL, ETT in place Lungs: CTAB, no increased WOB. No wheezing Cardiovascular: RRR, Nl S1/S2, no m/r/g Abdomen: Soft, NT, ND and +BS Extremities: Well-perfused.  Distal pulses 2+. No LE edema Neuro: On vent, obtuse, gag present, pupils continues to be sluggish reaction to light, does not follow commands, moves all extremities. Corneal reflex in touch. Less contracted today. Skin: Warm. Well-perfused  Resolved Hospital Problem list   N/A  Assessment & Plan:  68 year old male with hx of COPD and substance abuse found in cardiac arrest likely 2/2 to drug overdose, now s/p resuscitation and intubation on vent management w/ acute respiratory failure  and altered mental status.  Cardiac arrest: likely secondary to respiratory arrest from overdose - Continue tele monitoring - Off pressors - Continue IVF @ 75 mL/hr  Acute respiratory failure likely due to heroine overdose - Back on full vent support FiO2 40, Peep 5, RR 12, TV 560 - Continue vent support - Titrate O2 for sat of 88-92% - Wean of as tolerated - PRN albuterol  Acute anoxic encephalopathy - Head CT w/ no acute intracranial abnormality - EEG 8/10 suggestive of profound severe encephalopathy. No seizures or epileptiform discharges  - Neuro consult, appreciate recs - On propofol 20 mcg/kg/min  - F/u Brain MRI  - F/u EEG - Wean as tolerated  Sepsis? - Elevated temp to 102.7 overnight with tachycardia in the setting of acute respiratory failure - Cultures pending - Continue Vanc and Zosyn - Pharmacy consult, appreciate recs  AKI: Improved - Creatine increased to 1.29 <- 0.88 - K+ improved to 4,  - Mg improved to 2.2, monitor - Continue NS 75 ml/hr - Renal status improving, no need for renal U/S at the moment  FEN: - Nutrition for TF - IV Protonix - SCD's - Social work to get family for plan of care - Bedrest  Labs   CBC: Recent Labs  Lab 09/08/18 1619  09/08/18 2037 09/09/18 0313 09/09/18 0427 09/10/18 0359 09/10/18 0402  WBC 10.5  --  12.0* 11.4*  --   --  12.1*  NEUTROABS 5.9  --  8.7*  --   --   --   --   HGB 13.1   < > 12.3* 12.1* 11.9* 13.6 13.2  HCT 39.4   < > 36.5* 35.7* 35.0* 40.0 38.7*  MCV 86.2  --  85.5 84.4  --   --  84.5  PLT 261  --  241 227  --   --  223   < > = values in this interval not displayed.    Basic Metabolic Panel: Recent Labs  Lab 09/08/18 1619  09/08/18 2037 09/09/18 0313 09/09/18 0427 09/10/18 0359 09/10/18 0402 09/11/18 0145  NA 140   < > 141 140 144 138 139 140  K 3.1*   < > 3.3* 3.6 3.6 3.2* 3.2* 4.0  CL 106  --  108 108  --   --  106 107  CO2 20*  --  21* 20*  --   --  22 20*  GLUCOSE 230*  --  134*  112*  --   --  118* 108*  BUN 5*  --  6* 5*  --   --  5* 13  CREATININE 1.40*  --  1.09 0.84  --   --  0.88 1.29*  CALCIUM 8.1*  --  7.8* 7.9*  --   --  8.4* 8.5*  MG  --   --  1.7 1.5*  --   --  2.2  --   PHOS  --   --  2.0* 1.6*  --   --  1.9*  --    < > =  values in this interval not displayed.   GFR: Estimated Creatinine Clearance: 52.5 mL/min (A) (by C-G formula based on SCr of 1.29 mg/dL (H)). Recent Labs  Lab 09/08/18 1619  09/08/18 1645 09/08/18 2037 09/08/18 2353 09/09/18 0313 09/10/18 0402 09/10/18 1153  PROCALCITON  --   --   --  0.12  --   --   --   --   WBC 10.5  --   --  12.0*  --  11.4* 12.1*  --   LATICACIDVEN  --    < > 4.3* 3.7* 2.6*  --   --  1.3   < > = values in this interval not displayed.    Liver Function Tests: Recent Labs  Lab 09/08/18 1619 09/08/18 2037  AST 86* 97*  ALT 33 32  ALKPHOS 89 70  BILITOT 0.4 1.0  PROT 7.0 6.4*  ALBUMIN 3.7 3.5   No results for input(s): LIPASE, AMYLASE in the last 168 hours. No results for input(s): AMMONIA in the last 168 hours.  ABG    Component Value Date/Time   PHART 7.506 (H) 09/10/2018 0359   PCO2ART 32.5 09/10/2018 0359   PO2ART 177.0 (H) 09/10/2018 0359   HCO3 25.7 09/10/2018 0359   TCO2 27 09/10/2018 0359   ACIDBASEDEF 1.0 09/09/2018 0427   O2SAT 100.0 09/10/2018 0359     Coagulation Profile: Recent Labs  Lab 09/08/18 2037  INR 1.4*    Cardiac Enzymes: No results for input(s): CKTOTAL, CKMB, CKMBINDEX, TROPONINI in the last 168 hours.  HbA1C: Hgb A1c MFr Bld  Date/Time Value Ref Range Status  10/15/2015 09:06 AM 4.7 (L) 4.8 - 5.6 % Final    Comment:    (NOTE)         Pre-diabetes: 5.7 - 6.4         Diabetes: >6.4         Glycemic control for adults with diabetes: <7.0   10/14/2015 07:01 PM 4.9 4.8 - 5.6 % Final    Comment:    (NOTE)         Pre-diabetes: 5.7 - 6.4         Diabetes: >6.4         Glycemic control for adults with diabetes: <7.0     CBG: Recent Labs  Lab  09/10/18 1530 09/10/18 1950 09/11/18 0005 09/11/18 0428 09/11/18 0755  GLUCAP 132* 100* 98 94 102*     Sharrell KuProsper Marleni Gallardo, MS4

## 2018-09-11 NOTE — Progress Notes (Addendum)
Neurology progress note  Subjective: Micheal Carey was seen resting in his bed this morning. He continues to be on vent and not alert.   Exam: Vitals:   09/11/18 0801 09/11/18 0900  BP: 138/83 (!) 154/93  Pulse: 100 (!) 101  Resp: 16 17  Temp:    SpO2: 99% 99%   Gen: In bed, NAD Resp: non-labored breathing, no acute distress, on vent Abd: soft, nt  Mental Status: Patient does not respond to verbal stimuli.  Does not respond to deep sternal rub.  Does not follow commands.  No verbalizations noted.  Cranial Nerves: II: patient does not respond confrontation bilaterally,  III,IV,VI: doll's response absent bilaterally. pupils right 3 mm, left 3 mm, weak pupillary reflex on the right, pupillary reflex intact on left. V,VII: corneal reflex absent bilaterally  VIII: patient does not respond to verbal stimuli IX,X: gag reflex present, XI: trapezius strength unable to test bilaterally XII: tongue strength unable to test Motor: Extremities flaccid throughout.  No spontaneous movement noted.  No purposeful movements noted. Sensory: Does not respond to noxious stimuli in any extremity. Deep Tendon Reflexes:  Absent throughout. Plantars: absent bilaterally Cerebellar: Unable to perform  Pertinent Labs:  BMP: Na 140, K4, bicarb 20, Cr 1.29, AG 13.  Impression:  Micheal Carey is a 68 y.o m with copd, etoh and opiate abuse who presented post a pea arrest. He is being followed by neurology due to acute encephalopathy likely due to anoxia/hypoxia. Patient's brainstem reflexes intact and his neurological status continues to improve.   #Acute anoxic encephalopathy  -EEG showed profound severe encephalopathy without seizure or epileptiform discharges  #Acute respiratory failure secondary to presumed heroin overdose   #Post cardiac arrest   Recommendations:  -Repeat MRI -Repeat EEG -Continue to monitor for neurovascular status -We will need to arrange family meeting to discuss goals  of care  Lars Mage, MD Internal Medicine PGY3 Pager:[phone] 09/11/2018, 9:23 AM   Attending addendum Patient seen and examined Agree with the history and physical documented above. Independently reviewed the patient's chart, and perform an examination. Most likely hypoxic ischemic encephalopathy secondary to presumed opiate overdose  Recommendations are to repeat an MRI and EEG.  We will follow after the results are available  -- Amie Portland, MD [facility] Pager: [phone] If 7pm to 7am, please call on call as listed on AMION.

## 2018-09-11 NOTE — Progress Notes (Addendum)
CSW spoke with patient's son Marlowe Aschoff to inform him of visiting policy information. Marlowe Aschoff states understanding that he can be the only visitor moving forward as he is the designated decision maker for this patient. Marlowe Aschoff reports he will arrive at [facility] today about 3pm. CSW updated patient's RN.  Madilyn Fireman, MSW, LCSW-A Clinical Social Worker [facility] [phone]

## 2018-09-11 NOTE — Progress Notes (Addendum)
Pharmacy Antibiotic Note  Micheal Carey is a 68 y.o. male admitted on 09/08/2018 s/p PEA arrest likely secondary to heroine overdose. Pharmacy has been consulted for vancomycin and Zosyn dosing for new onset fevers early this morning.   Since starting vancomycin, the patient's Scr increased from 0.88 to 1.29. WBC 12.1, stable. Currently afebrile, RR 16-21, BP 138/105.   Plan: - Decrease vancomycin from 1250mg  IV every 24hr to 1000mg  IV every 24hr - Goal AUC 400-550. Estimated AUC: 445. SCr used: 1.29 - Continue Zosyn 3.375g IV every 8 hrs - Monitor renal function closely as patient has AKI, consider deescalating antibiotics when possible - Monitor C/S, clinical progression, and vancomycin levels as needed at steady state  Microbiology:  8/9 COVID: neg 8/9 BCx x4: NG x3 days 8/9 UCx: neg 8/11 MRSA PCR: neg 8/12 MRSA PCR: unable to determine/invalid 8/12 BCx: sent 8/12 RCx (tracheal aspirate): Gram stain-few gram (+) cocci, rare gram (+) rods  Antimicrobial Agents: Vancomycin 8/12 >> Zosyn 8/12 >>  Height: 5\' 8"  (172.7 cm) Weight: 143 lb 4.8 oz (65 kg) IBW/kg (Calculated) : 68.4  Temp (24hrs), Avg:100 F (37.8 C), Min:98.5 F (36.9 C), Max:102.7 F (39.3 C)  Recent Labs  Lab 09/08/18 1619 09/08/18 1621 09/08/18 1645 09/08/18 2037 09/08/18 2353 09/09/18 0313 09/10/18 0402 09/10/18 1153 09/11/18 0145  WBC 10.5  --   --  12.0*  --  11.4* 12.1*  --   --   CREATININE 1.40*  --   --  1.09  --  0.84 0.88  --  1.29*  LATICACIDVEN  --  5.7* 4.3* 3.7* 2.6*  --   --  1.3  --     Estimated Creatinine Clearance: 52.5 mL/min (A) (by C-G formula based on SCr of 1.29 mg/dL (H)).    No Known Allergies  Agnes Lawrence, PharmD PGY1 Pharmacy Resident

## 2018-09-11 NOTE — Progress Notes (Signed)
Pharmacy Antibiotic Note  Micheal Carey is a 68 y.o. male admitted on 09/08/2018 s/p PEA arrest likely 2/2 to heroine overdose, now with new fevers.  Pharmacy has been consulted for vancomycin and Zosyn dosing.  Plan: Vancomycin 1500mg  IV x1, then 1250mg  IV every 24h Goal AUC 400-550. Expected AUC: 459 SCr used: 0.88 Zosyn 3.375g IV every 8 hours Monitor renal function, Cx and clinical progression Vancomycin levels as needed at steady state  Height: 5\' 8"  (172.7 cm) Weight: 144 lb 2.9 oz (65.4 kg) IBW/kg (Calculated) : 68.4  Temp (24hrs), Avg:100.7 F (38.2 C), Min:99.8 F (37.7 C), Max:102.7 F (39.3 C)  Recent Labs  Lab 09/08/18 1619 09/08/18 1621 09/08/18 1645 09/08/18 2037 09/08/18 2353 09/09/18 0313 09/10/18 0402 09/10/18 1153  WBC 10.5  --   --  12.0*  --  11.4* 12.1*  --   CREATININE 1.40*  --   --  1.09  --  0.84 0.88  --   LATICACIDVEN  --  5.7* 4.3* 3.7* 2.6*  --   --  1.3    Estimated Creatinine Clearance: 77.4 mL/min (by C-G formula based on SCr of 0.88 mg/dL).    No Known Allergies  [name], PharmD Clinical Pharmacist Please check AMION for all [place] numbers 09/11/2018 12:42 AM

## 2018-09-11 NOTE — Progress Notes (Signed)
EEG complete - results pending 

## 2018-09-12 ENCOUNTER — Inpatient Hospital Stay (HOSPITAL_COMMUNITY): Payer: Medicare Other

## 2018-09-12 LAB — GLUCOSE, CAPILLARY
Glucose-Capillary: 107 mg/dL — ABNORMAL HIGH (ref 70–99)
Glucose-Capillary: 122 mg/dL — ABNORMAL HIGH (ref 70–99)
Glucose-Capillary: 119 mg/dL — ABNORMAL HIGH (ref 70–99)
Glucose-Capillary: 92 mg/dL (ref 70–99)
Glucose-Capillary: 103 mg/dL — ABNORMAL HIGH (ref 70–99)
Glucose-Capillary: 92 mg/dL (ref 70–99)

## 2018-09-12 LAB — TRIGLYCERIDES: Triglycerides: 157 mg/dL — ABNORMAL HIGH (ref <150)

## 2018-09-12 LAB — HEPATIC FUNCTION PANEL
ALT: 22 U/L (ref 0–44)
AST: 48 U/L — ABNORMAL HIGH (ref 15–41)
Albumin: 3.1 g/dL — ABNORMAL LOW (ref 3.5–5.0)
Alkaline Phosphatase: 64 U/L (ref 38–126)
Bilirubin, Direct: 0.6 mg/dL — ABNORMAL HIGH (ref 0.0–0.2)
Indirect Bilirubin: 1.5 mg/dL — ABNORMAL HIGH (ref 0.3–0.9)
Total Bilirubin: 2.1 mg/dL — ABNORMAL HIGH (ref 0.3–1.2)
Total Protein: 7.1 g/dL (ref 6.5–8.1)

## 2018-09-12 LAB — BASIC METABOLIC PANEL
Anion gap: 12 (ref 5–15)
BUN: 19 mg/dL (ref 8–23)
CO2: 22 mmol/L (ref 22–32)
Calcium: 8.3 mg/dL — ABNORMAL LOW (ref 8.9–10.3)
Chloride: 109 mmol/L (ref 98–111)
Creatinine, Ser: 1.3 mg/dL — ABNORMAL HIGH (ref 0.61–1.24)
GFR calc Af Amer: >60 mL/min (ref >60)
GFR calc non Af Amer: 57 mL/min — ABNORMAL LOW (ref >60)
Glucose, Bld: 117 mg/dL — ABNORMAL HIGH (ref 70–99)
Potassium: 3.3 mmol/L — ABNORMAL LOW (ref 3.5–5.1)
Sodium: 143 mmol/L (ref 135–145)

## 2018-09-12 LAB — CBC WITH DIFFERENTIAL/PLATELET
Abs Immature Granulocytes: 0.07 10*3/uL (ref 0.00–0.07)
Basophils Absolute: 0.1 10*3/uL (ref 0.0–0.1)
Basophils Relative: 1 %
Eosinophils Absolute: 0.3 10*3/uL (ref 0.0–0.5)
Eosinophils Relative: 2 %
HCT: 38 % — ABNORMAL LOW (ref 39.0–52.0)
Hemoglobin: 12.9 g/dL — ABNORMAL LOW (ref 13.0–17.0)
Immature Granulocytes: 1 %
Lymphocytes Relative: 7 %
Lymphs Abs: 1 10*3/uL (ref 0.7–4.0)
MCH: 28.7 pg (ref 26.0–34.0)
MCHC: 33.9 g/dL (ref 30.0–36.0)
MCV: 84.4 fL (ref 80.0–100.0)
Monocytes Absolute: 1.1 10*3/uL — ABNORMAL HIGH (ref 0.1–1.0)
Monocytes Relative: 8 %
Neutro Abs: 11.5 10*3/uL — ABNORMAL HIGH (ref 1.7–7.7)
Neutrophils Relative %: 81 %
Platelets: 216 10*3/uL (ref 150–400)
RBC: 4.5 MIL/uL (ref 4.22–5.81)
RDW: 12.5 % (ref 11.5–15.5)
WBC: 14 10*3/uL — ABNORMAL HIGH (ref 4.0–10.5)
nRBC: 0 % (ref 0.0–0.2)

## 2018-09-12 LAB — PROTIME-INR
INR: 1.2 (ref 0.8–1.2)
Prothrombin Time: 15.3 seconds — ABNORMAL HIGH (ref 11.4–15.2)

## 2018-09-12 LAB — MRSA CULTURE: Culture: NOT DETECTED

## 2018-09-12 LAB — MAGNESIUM: Magnesium: 2.3 mg/dL (ref 1.7–2.4)

## 2018-09-12 LAB — PHOSPHORUS: Phosphorus: 3 mg/dL (ref 2.5–4.6)

## 2018-09-12 MED ORDER — SODIUM CHLORIDE 0.9 % IV BOLUS
1000.0000 mL | Freq: Once | INTRAVENOUS | Status: AC
  Administered 2018-09-12: 1000 mL via INTRAVENOUS

## 2018-09-12 MED ORDER — THIAMINE HCL 100 MG/ML IJ SOLN
100.0000 mg | Freq: Every day | INTRAMUSCULAR | Status: DC
  Administered 2018-09-12 – 2018-09-13 (×2): 100 mg via INTRAVENOUS
  Filled 2018-09-12 (×2): qty 2

## 2018-09-12 MED ORDER — POTASSIUM CHLORIDE 20 MEQ/15ML (10%) PO SOLN
40.0000 meq | Freq: Once | ORAL | Status: AC
  Administered 2018-09-12: 40 meq
  Filled 2018-09-12: qty 30

## 2018-09-12 MED ORDER — FOLIC ACID 5 MG/ML IJ SOLN
1.0000 mg | Freq: Every day | INTRAMUSCULAR | Status: DC
  Administered 2018-09-12 – 2018-09-13 (×2): 1 mg via INTRAVENOUS
  Filled 2018-09-12 (×3): qty 0.2

## 2018-09-12 NOTE — Progress Notes (Signed)
NAME:  Micheal Carey, MRN:  [id number], DOB:  1950-04-08, LOS: 4 ADMISSION DATE:  09/08/2018, CONSULTATION DATE:  09/08/2018 REFERRING MD:  EDP Silverio Lay- Yao, CHIEF COMPLAINT:  Cardiac arrest and acute respiratory failure   Brief History   Mr. Micheal Carey is a 68 year old male with PMH of COPD, ETOH and opiate abuse who was found down in the parking lot with PEA and now s/p CPR and intubation and admitted for acute respiratory failure with hypoxemia.  History of present illness   68 year old male with history of ETOH and opiate abuse who was found down in the parking lot where he was found unresponsive by bystanders. Cardiac rhythm showed asystole first then PEA. CPR was done for 10 minutes with 1 epi and he pulses returned. Unknown down time before CPR. He became hemodynamically stable but remained completely unresponsive. Patient had a palpable pulse on arrival with some spontaneous movements but did not follow directions. Upon review of his chart, it was found that patient has hx of drug and alcohol problems. Patient was intubated in the ED. His alcohol level was 276 and UDS + for opiates. COVID negative. Suspect drug overdose as the cause of AMS. PCCM was called on consultation.   Past Medical History  Substance abuse, COPD  Significant Hospital Events   CPR 8/9>>> Intubation 8/9 >>>  Consults:  PCCM Neurology  Procedures:  ETT 8/9>>>  Significant Diagnostic Tests:  CXR 8/9 >>> Low lung volume, no acute cardiopulmonary dz, ETT w/ tip 4.8 cm above carina Head CT 8/9 >>> No acute intracranial abnormality. CT C-spine >>> No acute cervical spine fracture. CXR 8/9 >>> I reviewed myself, ETT is in a good place CXR 8/10 >>> No acute cardiopulmonary abnormality. ETT stable and in appropriate location EEG 8/10 >>> Suggestive of profound severe encephalopathy. No seizures or epileptiform discharges  TTE ECHO 8/10 >>> Normal LVSF, EF 60-65%, mild MR CXR 8/11 >>> ET in stable position. NGT slightly  withdrawn, Suggested advancement of ~10 cm. No acute cardiopulmonary disease. MRI brain 8/12 >>> Supratentorial and infratentorial diffusion-weighted and T2/FLAIR signal abnormality c/w hypoxic/ischemic injury. Moderate chronic small vessel ischemic disease. EEG 8/12 >>> Suggestive of profound severe encephalopathy. No seizures or clear epileptiform discharges  CXR 8/13 >>> No edema or consolidation. ETT tip 4.9 cm above the carina  Micro Data:  SARS coronavirus 2 8/9 >>> Negative MRSA PCR 8/9 >>> Negative Blood 8/9>>> No growth in 24 hr Urine 8/9>>> No growth Blood 8/12 >>> pending Respiratory culture 8/12 >>> pending     Gram stain >>> Mod WBC, mostly PMNs, few gram + cocci, rare gram + rods  Antimicrobials:  Zosyn 8/12 >>> Vancomycin 8/12 >>>  Interim history/subjective:  Patient went for Brain MRI and EEG yesterday. There was a discussion with his son and daughter about his prognosis. He remained a febrile overnight but continues to be on sedation and full vent support. This AM, he opens eyes, blinks and move extremities spontaneously, but no meaning movement and does not follow commands.  Objective   Blood pressure 110/77, pulse (!) 109, temperature 98.8 F (37.1 C), temperature source Oral, resp. rate (!) 23, height 5\' 8"  (1.727 m), weight 61.8 kg, SpO2 99 %.    Vent Mode: PRVC FiO2 (%):  [30 %-40 %] 30 % Set Rate:  [12 bmp] 12 bmp Vt Set:  [560 mL] 560 mL PEEP:  [5 cmH20] 5 cmH20 Plateau Pressure:  [18 cmH20-25 cmH20] 18 cmH20   Intake/Output Summary (Last  24 hours) at 09/12/2018 0910 Last data filed at 09/12/2018 0845 Gross per 24 hour  Intake 467.64 ml  Output 1690 ml  Net -1222.36 ml   Filed Weights   09/10/18 0329 09/11/18 0500 09/12/18 0500  Weight: 65.4 kg 65 kg 61.8 kg   Examination: General: Acutely ill appearing male, unresponsive. NAD HENT: [name], PERRL, ETT in place Lungs: Intubated, CTAB, no increased WOB. No wheezing Cardiovascular: tachycardic, normal  rhythm, Nl S1/S2, no m/r/g Abdomen: Soft, NT, ND and +BS Extremities: Well-perfused. Distal pulses 2+. No LE edema Neuro: On vent, obtuse, gag present, pupils sluggish reaction to light, does not follow commands, moves all extremities spontaneously. Corneal reflex in touch. No reaction to painful stimuli Skin: Warm. Well-perfused  Resolved Hospital Problem list   N/A  Assessment & Plan:  68 year old male with hx of COPD and substance abuse found in cardiac arrest likely 2/2 to drug overdose, now s/p resuscitation and intubation on vent management w/ acute respiratory failure and altered mental status.  Cardiac arrest: likely secondary to respiratory arrest from overdose - Continue tele monitoring - Off pressors - Continue IVF @ 75 mL/hr  Acute respiratory failure likely due to heroine overdose - Full vent support FiO2 30, Peep 5, RR 12, TV 560 - Continue vent support, and wean as tolerated - Titrate O2 for sat of 88-92% - PRN albuterol - Continues to be on sedation due to coughing fits and gagging on ETT w/o sedation  Acute anoxic encephalopathy - Head CT w/ no acute intracranial abnormality - EEG 8/10 suggestive of profound severe encephalopathy. No seizures or epileptiform discharges, repeat EEG 8/12 no change from previous - Brain MRI shows DWI and FLAIR changes suggestive of global hypoxic ischemic injury. - No significant changes or progress on neuro exam - Continues to fail weaning due to gagging and coughing fits - Propofol down to 15 mcg/kg/min  - Neuro following, but does not feel anything needs to be done neurologically as progression to meaningful neurological status is poor.   Sepsis? - Remained afebrile overnight, still tachy - Cultures still pending - Check pro-calcitonin - Vanc reduced to 1000mg  IV q24hr - Continue Zosyn 3.375g IV q8hr - Pharmacy consult, appreciate recs  AKI: Worsening - Creatine increased to 1.3 - K+ decreased to 3.3, replete - Mg improved  to 2.3, monitor - Continue NS 75 ml/hr - Consider renal U/S if no improvement in renal function  FEN: - Nutrition for TF - IV Protonix - SCD's - Social work to get family for plan of care - Bedrest  Labs   CBC: Recent Labs  Lab 09/08/18 1619  09/08/18 2037 09/09/18 0313 09/09/18 0427 09/10/18 0359 09/10/18 0402 09/12/18 0047  WBC 10.5  --  12.0* 11.4*  --   --  12.1* 14.0*  NEUTROABS 5.9  --  8.7*  --   --   --   --  11.5*  HGB 13.1   < > 12.3* 12.1* 11.9* 13.6 13.2 12.9*  HCT 39.4   < > 36.5* 35.7* 35.0* 40.0 38.7* 38.0*  MCV 86.2  --  85.5 84.4  --   --  84.5 84.4  PLT 261  --  241 227  --   --  223 216   < > = values in this interval not displayed.    Basic Metabolic Panel: Recent Labs  Lab 09/08/18 2037 09/09/18 0313 09/09/18 0427 09/10/18 0359 09/10/18 0402 09/11/18 0145 09/12/18 0047  NA 141 140 144 138 139 140  143  K 3.3* 3.6 3.6 3.2* 3.2* 4.0 3.3*  CL 108 108  --   --  106 107 109  CO2 21* 20*  --   --  22 20* 22  GLUCOSE 134* 112*  --   --  118* 108* 117*  BUN 6* 5*  --   --  5* 13 19  CREATININE 1.09 0.84  --   --  0.88 1.29* 1.30*  CALCIUM 7.8* 7.9*  --   --  8.4* 8.5* 8.3*  MG 1.7 1.5*  --   --  2.2  --  2.3  PHOS 2.0* 1.6*  --   --  1.9*  --  3.0   GFR: Estimated Creatinine Clearance: 49.5 mL/min (A) (by C-G formula based on SCr of 1.3 mg/dL (H)). Recent Labs  Lab 09/08/18 1645 09/08/18 2037 09/08/18 2353 09/09/18 0313 09/10/18 0402 09/10/18 1153 09/12/18 0047  PROCALCITON  --  0.12  --   --   --   --   --   WBC  --  12.0*  --  11.4* 12.1*  --  14.0*  LATICACIDVEN 4.3* 3.7* 2.6*  --   --  1.3  --     Liver Function Tests: Recent Labs  Lab 09/08/18 1619 09/08/18 2037 09/12/18 0047  AST 86* 97* 48*  ALT 33 32 22  ALKPHOS 89 70 64  BILITOT 0.4 1.0 2.1*  PROT 7.0 6.4* 7.1  ALBUMIN 3.7 3.5 3.1*   No results for input(s): LIPASE, AMYLASE in the last 168 hours. No results for input(s): AMMONIA in the last 168 hours.  ABG     Component Value Date/Time   PHART 7.506 (H) 09/10/2018 0359   PCO2ART 32.5 09/10/2018 0359   PO2ART 177.0 (H) 09/10/2018 0359   HCO3 25.7 09/10/2018 0359   TCO2 27 09/10/2018 0359   ACIDBASEDEF 1.0 09/09/2018 0427   O2SAT 100.0 09/10/2018 0359     Coagulation Profile: Recent Labs  Lab 09/08/18 2037 09/12/18 0047  INR 1.4* 1.2    Cardiac Enzymes: No results for input(s): CKTOTAL, CKMB, CKMBINDEX, TROPONINI in the last 168 hours.  HbA1C: Hgb A1c MFr Bld  Date/Time Value Ref Range Status  10/15/2015 09:06 AM 4.7 (L) 4.8 - 5.6 % Final    Comment:    (NOTE)         Pre-diabetes: 5.7 - 6.4         Diabetes: >6.4         Glycemic control for adults with diabetes: <7.0   10/14/2015 07:01 PM 4.9 4.8 - 5.6 % Final    Comment:    (NOTE)         Pre-diabetes: 5.7 - 6.4         Diabetes: >6.4         Glycemic control for adults with diabetes: <7.0     CBG: Recent Labs  Lab 09/11/18 1640 09/11/18 1952 09/11/18 2333 09/12/18 0337 09/12/18 0802  GLUCAP 104* 105* 105* 107* 122*   Sharrell KuProsper Jaileen Janelle, MS4

## 2018-09-12 NOTE — Progress Notes (Addendum)
Neurology Progress Note   S:// Patient seen and examined. No changes in clinical exam overnight per patient's RN. MRI completed- DWI and FLAIR changes suggestive of global hypoxic ischemic injury.   O:// Current vital signs: BP 122/75   Pulse (!) 101   Temp 98.8 F (37.1 C) (Oral)   Resp 12   Ht _0  (1.727 m)   Wt 61.8 kg   SpO2 99%   BMI 20.72 kg/m  Vital signs in last 24 hours: Temp:  [98.1 F (36.7 C)-100.1 F (37.8 C)] 98.8 F (37.1 C) (08/13 0805) Pulse Rate:  [91-106] 101 (08/13 0700) Resp:  [12-24] 12 (08/13 0700) BP: (113-164)/(69-105) 122/75 (08/13 0700) SpO2:  [98 %-100 %] 99 % (08/13 0700) FiO2 (%):  [30 %-40 %] 30 % (08/13 0400) Weight:  [61.8 kg] 61.8 kg (08/13 0500) Patient was on propofol-held for examination. Neurological exam Sedated intubated-sedation held for exam No spontaneous movements noted. Upon holding sedation, about after 5 minutes of discontinuing propofol, there was some spontaneous eye opening. He did not follow any commands or track the examiner. Cranial nerves: Pupils appear equal, round reactive light, he has corneal reflexes present bilaterally, he had roving eye movements.  Facial symmetry difficult to ascertain because of the tube Motor exam: No spontaneous movements noted initially but during the encounter, he did have some spontaneous mild extensor posturing of both upper extremities.  No response to upper or lower extremity to noxious stimulation Sensory exam: As above Coordination cannot be tested due to his mentation Gait also cannot be tested due to his mentation  Medications  Current Facility-Administered Medications:  .  0.9 %  sodium chloride infusion, 250 mL, Intravenous, Continuous, Rush Farmer, MD, Stopped at 09/11/18 0247 .  albuterol (PROVENTIL) (2.5 MG/3ML) 0.083% nebulizer solution 2.5 mg, 2.5 mg, Nebulization, Q2H PRN, Rush Farmer, MD .  chlorhexidine gluconate (MEDLINE KIT) (PERIDEX) 0.12 % solution 15  mL, 15 mL, Mouth Rinse, BID, Rush Farmer, MD, 15 mL at 09/11/18 2000 .  Chlorhexidine Gluconate Cloth 2 % PADS 6 each, 6 each, Topical, Q0600, Rush Farmer, MD, 6 each at 09/11/18 1150 .  enoxaparin (LOVENOX) injection 40 mg, 40 mg, Subcutaneous, Q24H, Rush Farmer, MD, 40 mg at 09/11/18 0904 .  feeding supplement (PRO-STAT SUGAR FREE 64) liquid 30 mL, 30 mL, Per Tube, Daily, Rush Farmer, MD, 30 mL at 09/11/18 0904 .  feeding supplement (VITAL AF 1.2 CAL) liquid 1,000 mL, 1,000 mL, Per Tube, Continuous, Rush Farmer, MD, Stopped at 09/09/18 1344 .  fentaNYL (SUBLIMAZE) injection 25 mcg, 25 mcg, Intravenous, Q15 min PRN, Rush Farmer, MD .  fentaNYL (SUBLIMAZE) injection 25-100 mcg, 25-100 mcg, Intravenous, Q30 min PRN, Rush Farmer, MD, 100 mcg at 09/09/18 2309 .  ibuprofen (ADVIL) 100 MG/5ML suspension 400 mg, 400 mg, Per Tube, Q8H PRN, Anders Simmonds, MD .  insulin aspart (novoLOG) injection 2-6 Units, 2-6 Units, Subcutaneous, Q4H, Rush Farmer, MD, 2 Units at 09/12/18 [phone] .  MEDLINE mouth rinse, 15 mL, Mouth Rinse, 10 times per day, Rush Farmer, MD, 15 mL at 09/12/18 0523 .  pantoprazole (PROTONIX) injection 40 mg, 40 mg, Intravenous, Q24H, Anders Simmonds, MD, 40 mg at 09/11/18 2156 .  piperacillin-tazobactam (ZOSYN) IVPB 3.375 g, 3.375 g, Intravenous, Q8H, [name], RPH, Last Rate: 12.5 mL/hr at 09/12/18 0618, 3.375 g at 09/12/18 0618 .  pneumococcal 23 valent vaccine (PNU-IMMUNE) injection 0.5 mL, 0.5 mL, Intramuscular, Prior to discharge,  Rush Farmer, MD .  propofol (DIPRIVAN) 1000 MG/100ML infusion, 5-80 mcg/kg/min, Intravenous, Titrated, Rush Farmer, MD, Last Rate: 11.7 mL/hr at 09/12/18 0600, 30 mcg/kg/min at 09/12/18 0600 .  vancomycin (VANCOCIN) IVPB 1000 mg/200 mL premix, 1,000 mg, Intravenous, Q24H, Ramaswamy, Murali, MD, Last Rate: 200 mL/hr at 09/12/18 0042, 1,000 mg at 09/12/18 0042 Labs CBC    Component Value Date/Time   WBC 14.0  (H) 09/12/2018 0047   RBC 4.50 09/12/2018 0047   HGB 12.9 (L) 09/12/2018 0047   HCT 38.0 (L) 09/12/2018 0047   PLT 216 09/12/2018 0047   MCV 84.4 09/12/2018 0047   MCH 28.7 09/12/2018 0047   MCHC 33.9 09/12/2018 0047   RDW 12.5 09/12/2018 0047   LYMPHSABS 1.0 09/12/2018 0047   MONOABS 1.1 (H) 09/12/2018 0047   EOSABS 0.3 09/12/2018 0047   BASOSABS 0.1 09/12/2018 0047    CMP     Component Value Date/Time   NA 143 09/12/2018 0047   K 3.3 (L) 09/12/2018 0047   CL 109 09/12/2018 0047   CO2 22 09/12/2018 0047   GLUCOSE 117 (H) 09/12/2018 0047   BUN 19 09/12/2018 0047   CREATININE 1.30 (H) 09/12/2018 0047   CALCIUM 8.3 (L) 09/12/2018 0047   PROT 7.1 09/12/2018 0047   ALBUMIN 3.1 (L) 09/12/2018 0047   AST 48 (H) 09/12/2018 0047   ALT 22 09/12/2018 0047   ALKPHOS 64 09/12/2018 0047   BILITOT 2.1 (H) 09/12/2018 0047   GFRNONAA 57 (L) 09/12/2018 0047   GFRAA >60 09/12/2018 0047    glycosylated hemoglobin  Lipid Panel     Component Value Date/Time   TRIG 173 (H) 09/10/2018 0402     Imaging I have reviewed images in epic and the results pertinent to this consultation are: MRI brain suggestive of diffuse hypoxic anoxic brain injury.  EEG with severe encephalopathy and no reaction to noxious stimulation.  Assessment: 68 year old man with past history of COPD, alcohol and opiate abuse presented after PEA arrest with presumed opiate overdose, continue to be encephalopathic and neurology was consulted for evaluation for possible hypoxic anoxic brain injury and neuro prognosis. Current exam-he does have intact brainstem reflexes but has not shown any improvement otherwise in his neurological exam. I had a detailed discussion with the son and daughter who are visiting from [place] and explained to them that 79 to 72 hours after the presentation, a poor neurological exam with no cortical signs usually portends a grim chance for any meaningful neurological  recovery.   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 I would recommend continuing the goals of care conversation as I discussed in detail with the family that the chances of meaningful neurological recovery are grim and I do not suspect that he would progress to brain death based on his imaging of the brain.  He will have a very poor quality of life going forward if full scope of care is pursued.  Family verbalized understanding of the information that I provided with them. I will be available to meet with the family if needed. Please call with questions.  -- Amie Portland, MD [facility] Pager: [phone] If 7pm to 7am, please call on call as listed on AMION.  CRITICAL CARE ATTESTATION Performed by: Amie Portland, MD Total critical care time: 40 minutes Critical care time was exclusive of separately billable procedures and treating  other patients and/or supervising APPs/Residents/Students Critical care was necessary to treat or prevent imminent or life-threatening deterioration due to hypoxic anoxic brain injury This patient is critically ill and at significant risk for neurological worsening and/or death and care requires constant monitoring. Critical care was time spent personally by me on the following activities: development of treatment plan with patient and/or surrogate as well as nursing, discussions with consultants, evaluation of patient's response to treatment, examination of patient, obtaining history from patient or surrogate, ordering and performing treatments and interventions, ordering and review of laboratory studies, ordering and review of radiographic studies, pulse oximetry, re-evaluation of patient's condition, participation in multidisciplinary rounds and medical decision making of high complexity in the care of this  patient.

## 2018-09-12 NOTE — Progress Notes (Signed)
CSW met with patient's son and daughter at bedside to have discussion regarding a discharge plan. Patient's children have decided to proceed with a tracheostomy.  Children were questioning CSW on options for care, including SNF placement or transfer to another hospital in [place] closer to their homes. CSW thoroughly explained that the patient would require long term Medicaid, the out of pocket cost to transfer the patient to another facility, in addition to finding an accepting facility and physician. Both children stated understanding but requested to speak with MD once again. CSW notified MD and he spoke with the family. Children are optimistic about their father's recovery.  CSW spoke with Leanord Hawking, Unit Director to discuss the need for the order for disclosure for [employer]. Angela Nevin reached out to Gerald Dexter of Security to request his assistance.  Madilyn Fireman, MSW, LCSW-A Clinical Social Worker [facility] [phone]

## 2018-09-13 ENCOUNTER — Inpatient Hospital Stay (HOSPITAL_COMMUNITY): Payer: Medicare Other

## 2018-09-13 DIAGNOSIS — J9600 Acute respiratory failure, unspecified whether with hypoxia or hypercapnia: Secondary | ICD-10-CM

## 2018-09-13 LAB — BASIC METABOLIC PANEL
Anion gap: 13 (ref 5–15)
BUN: 21 mg/dL (ref 8–23)
CO2: 18 mmol/L — ABNORMAL LOW (ref 22–32)
Calcium: 8.7 mg/dL — ABNORMAL LOW (ref 8.9–10.3)
Chloride: 117 mmol/L — ABNORMAL HIGH (ref 98–111)
Creatinine, Ser: 1.1 mg/dL (ref 0.61–1.24)
GFR calc Af Amer: >60 mL/min (ref >60)
GFR calc non Af Amer: >60 mL/min (ref >60)
Glucose, Bld: 102 mg/dL — ABNORMAL HIGH (ref 70–99)
Potassium: 3.3 mmol/L — ABNORMAL LOW (ref 3.5–5.1)
Sodium: 148 mmol/L — ABNORMAL HIGH (ref 135–145)

## 2018-09-13 LAB — CBC WITH DIFFERENTIAL/PLATELET
Abs Immature Granulocytes: 0.04 10*3/uL (ref 0.00–0.07)
Basophils Absolute: 0.1 10*3/uL (ref 0.0–0.1)
Basophils Relative: 1 %
Eosinophils Absolute: 0.9 10*3/uL — ABNORMAL HIGH (ref 0.0–0.5)
Eosinophils Relative: 9 %
HCT: 39.4 % (ref 39.0–52.0)
Hemoglobin: 13 g/dL (ref 13.0–17.0)
Immature Granulocytes: 0 %
Lymphocytes Relative: 10 %
Lymphs Abs: 1 10*3/uL (ref 0.7–4.0)
MCH: 28.4 pg (ref 26.0–34.0)
MCHC: 33 g/dL (ref 30.0–36.0)
MCV: 86 fL (ref 80.0–100.0)
Monocytes Absolute: 0.9 10*3/uL (ref 0.1–1.0)
Monocytes Relative: 10 %
Neutro Abs: 6.6 10*3/uL (ref 1.7–7.7)
Neutrophils Relative %: 70 %
Platelets: 179 10*3/uL (ref 150–400)
RBC: 4.58 MIL/uL (ref 4.22–5.81)
RDW: 12.9 % (ref 11.5–15.5)
WBC: 9.4 10*3/uL (ref 4.0–10.5)
nRBC: 0 % (ref 0.0–0.2)

## 2018-09-13 LAB — GLUCOSE, CAPILLARY
Glucose-Capillary: 80 mg/dL (ref 70–99)
Glucose-Capillary: 110 mg/dL — ABNORMAL HIGH (ref 70–99)
Glucose-Capillary: 92 mg/dL (ref 70–99)
Glucose-Capillary: 95 mg/dL (ref 70–99)
Glucose-Capillary: 109 mg/dL — ABNORMAL HIGH (ref 70–99)

## 2018-09-13 LAB — CULTURE, BLOOD (ROUTINE X 2)
Culture: NO GROWTH
Culture: NO GROWTH
Culture: NO GROWTH
Culture: NO GROWTH
Special Requests: ADEQUATE
Special Requests: ADEQUATE
Special Requests: ADEQUATE

## 2018-09-13 LAB — MAGNESIUM: Magnesium: 2.5 mg/dL — ABNORMAL HIGH (ref 1.7–2.4)

## 2018-09-13 LAB — PHOSPHORUS: Phosphorus: 2.6 mg/dL (ref 2.5–4.6)

## 2018-09-13 MED ORDER — PRO-STAT SUGAR FREE PO LIQD
30.0000 mL | Freq: Three times a day (TID) | ORAL | Status: DC
  Administered 2018-09-13 – 2018-09-17 (×12): 30 mL
  Filled 2018-09-13 (×12): qty 30

## 2018-09-13 MED ORDER — VITAL HIGH PROTEIN PO LIQD
1000.0000 mL | ORAL | Status: DC
  Administered 2018-09-13 – 2018-09-17 (×4): 1000 mL

## 2018-09-13 NOTE — Progress Notes (Signed)
NAME:  Micheal Carey, MRN:  [id number], DOB:  01-Jun-1950, LOS: 5 ADMISSION DATE:  09/08/2018, CONSULTATION DATE:  09/08/2018 REFERRING MD:  EDP Darl Householder, CHIEF COMPLAINT:  Cardiac arrest and acute respiratory failure   Brief History   Mr. [name] is a 68 year old male with PMH of COPD, ETOH and opiate abuse who was found down in the parking lot with PEA and now s/p CPR and intubation and admitted for acute respiratory failure with hypoxemia.  History of present illness   68 year old male with history of ETOH and opiate abuse who was found down in the parking lot where he was found unresponsive by bystanders. Cardiac rhythm showed asystole first then PEA. CPR was done for 10 minutes with 1 epi and he pulses returned. Unknown down time before CPR. He became hemodynamically stable but remained completely unresponsive. Patient had a palpable pulse on arrival with some spontaneous movements but did not follow directions. Upon review of his chart, it was found that patient has hx of drug and alcohol problems. Patient was intubated in the ED. His alcohol level was 276 and UDS + for opiates. COVID negative. Suspect drug overdose as the cause of AMS. PCCM was called on consultation.   Past Medical History  Substance abuse, COPD  Significant Hospital Events   CPR 8/9>>> Intubation 8/9 >>>  Consults:  PCCM Neurology  Procedures:  ETT 8/9>>>  Significant Diagnostic Tests:  CXR 8/9 >>> Low lung volume, no acute cardiopulmonary dz, ETT w/ tip 4.8 cm above carina Head CT 8/9 >>> No acute intracranial abnormality. CT C-spine >>> No acute cervical spine fracture. CXR 8/9 >>> I reviewed myself, ETT is in a good place CXR 8/10 >>> No acute cardiopulmonary abnormality. ETT stable and in appropriate location EEG 8/10 >>> Suggestive of profound severe encephalopathy. No seizures or epileptiform discharges  TTE ECHO 8/10 >>> Normal LVSF, EF 60-65%, mild MR CXR 8/11 >>> ET in stable position. NGT slightly  withdrawn, Suggested advancement of ~10 cm. No acute cardiopulmonary disease. MRI brain 8/12 >>> Supratentorial and infratentorial diffusion-weighted and T2/FLAIR signal abnormality c/w hypoxic/ischemic injury. Moderate chronic small vessel ischemic disease. EEG 8/12 >>> Suggestive of profound severe encephalopathy. No seizures or clear epileptiform discharges  CXR 8/13 >>> No edema or consolidation. ETT tip 4.9 cm above the carina CXR 8/14 >>> No acute abnormality  Micro Data:  SARS coronavirus 2 8/9 >>> Negative MRSA PCR 8/9 >>> Negative Blood 8/9>>> No growth in 24 hr Urine 8/9>>> No growth Blood 8/12 >>> No growth in 1 day Respiratory culture 8/12 >>> pending     Gram stain >>> Mod WBC, mostly PMNs, few gram + cocci, rare gram + rods  Antimicrobials:  Zosyn 8/12 >>> Vancomycin 8/12 >>>  Interim history/subjective:  Yesterday, the team had a discussion with the pat's children about his goals of care. They decided they want him to get a trach and they want to transfer him back to [place] after the procedure. They want [place] to continue his full code status. Patient had no acute events overnight.   Objective   Blood pressure 111/79, pulse 63, temperature 97.8 F (36.6 C), temperature source Axillary, resp. rate 12, height 5\' 8"  (1.727 m), weight 62.1 kg, SpO2 100 %.    Vent Mode: PRVC FiO2 (%):  [30 %] 30 % Set Rate:  [12 bmp] 12 bmp Vt Set:  [560 mL] 560 mL PEEP:  [5 cmH20] 5 cmH20 Plateau Pressure:  [16 cmH20-19 cmH20] 17  cmH20   Intake/Output Summary (Last 24 hours) at 09/13/2018 0804 Last data filed at 09/13/2018 0600 Gross per 24 hour  Intake 2176.47 ml  Output 625 ml  Net 1551.47 ml   Filed Weights   09/11/18 0500 09/12/18 0500 09/13/18 0336  Weight: 65 kg 61.8 kg 62.1 kg   Examination: General: Acutely ill appearing male, unresponsive. NAD HENT: [place]/AT, PERRL, ETT in place Lungs: Intubated, CTAB, no increased WOB. No wheezing Cardiovascular: tachycardic, normal  rhythm, Nl S1/S2, no m/r/g Abdomen: Soft, NT, ND and +BS Extremities: Well-perfused. Distal pulses 2+. No LE edema Neuro: On vent, obtuse, gag present, pupils sluggish reaction to light, does not follow commands, moves all extremities spontaneously. Corneal reflex in touch. No reaction to painful stimuli Skin: Warm. Well-perfused  Resolved Hospital Problem list   N/A  Assessment & Plan:  68 year old male with hx of COPD and substance abuse found in cardiac arrest likely 2/2 to drug overdose, now s/p resuscitation and intubation on vent management w/ acute respiratory failure and altered mental status.  Cardiac arrest: likely secondary to respiratory arrest from overdose - Continue tele monitoring - Off pressors - Continue IVF @ 75 mL/hr  Acute respiratory failure likely due to heroine overdose - Continue full vent support FiO2 30, Peep 5, RR 12, TV 560 - Wean as tolerated - PRN albuterol - Continues to be on sedation due to coughing fits and gagging on ETT w/o sedation  Acute anoxic encephalopathy - Head CT w/ no acute intracranial abnormality - EEG 8/10 suggestive of profound severe encephalopathy. No seizures or epileptiform discharges, repeat EEG 8/12 no change from previous - Brain MRI shows DWI and FLAIR changes suggestive of global hypoxic ischemic injury. - No significant changes or progress on neuro exam - Continues to fail weaning due to gagging and coughing fits - Propofol down to 15 mcg/kg/min  - Neuro following, but does not feel anything needs to be done neurologically as progression to meaningful neurological status is poor.  - Family wants trach placed - Plan for trach on 8/17 w/ Dr. Molli KnockYacoub  Sepsis? - Normal WBC, still afebrile - Cultures negative - D/c vanc - Continue Zosyn 3.375g IV q8hr - Pharmacy consult, appreciate recs  AKI: Stable - F/u BMP today - K+ decreased to 3.3, replete - Mg improved to 2.3, monitor - Continue NS 75 ml/hr - Consider renal U/S  if no improvement in renal function  FEN: - Nutrition for TF - IV Protonix - SCD's - Social work to get family for plan of care - Bedrest  Labs   CBC: Recent Labs  Lab 09/08/18 1619  09/08/18 2037 09/09/18 0313 09/09/18 0427 09/10/18 0359 09/10/18 0402 09/12/18 0047 09/13/18 0328  WBC 10.5  --  12.0* 11.4*  --   --  12.1* 14.0* 9.4  NEUTROABS 5.9  --  8.7*  --   --   --   --  11.5* 6.6  HGB 13.1   < > 12.3* 12.1* 11.9* 13.6 13.2 12.9* 13.0  HCT 39.4   < > 36.5* 35.7* 35.0* 40.0 38.7* 38.0* 39.4  MCV 86.2  --  85.5 84.4  --   --  84.5 84.4 86.0  PLT 261  --  241 227  --   --  223 216 179   < > = values in this interval not displayed.    Basic Metabolic Panel: Recent Labs  Lab 09/08/18 2037 09/09/18 [id number] 09/09/18 0427 09/10/18 0359 09/10/18 0402 09/11/18 0145 09/12/18 0047  09/13/18 0328  NA 141 140 144 138 139 140 143  --   K 3.3* 3.6 3.6 3.2* 3.2* 4.0 3.3*  --   CL 108 108  --   --  106 107 109  --   CO2 21* 20*  --   --  22 20* 22  --   GLUCOSE 134* 112*  --   --  118* 108* 117*  --   BUN 6* 5*  --   --  5* 13 19  --   CREATININE 1.09 0.84  --   --  0.88 1.29* 1.30*  --   CALCIUM 7.8* 7.9*  --   --  8.4* 8.5* 8.3*  --   MG 1.7 1.5*  --   --  2.2  --  2.3 2.5*  PHOS 2.0* 1.6*  --   --  1.9*  --  3.0 2.6   GFR: Estimated Creatinine Clearance: 49.8 mL/min (A) (by C-G formula based on SCr of 1.3 mg/dL (H)). Recent Labs  Lab 09/08/18 1645 09/08/18 2037 09/08/18 2353 09/09/18 0313 09/10/18 0402 09/10/18 1153 09/12/18 0047 09/13/18 0328  PROCALCITON  --  0.12  --   --   --   --   --   --   WBC  --  12.0*  --  11.4* 12.1*  --  14.0* 9.4  LATICACIDVEN 4.3* 3.7* 2.6*  --   --  1.3  --   --     Liver Function Tests: Recent Labs  Lab 09/08/18 1619 09/08/18 2037 09/12/18 0047  AST 86* 97* 48*  ALT 33 32 22  ALKPHOS 89 70 64  BILITOT 0.4 1.0 2.1*  PROT 7.0 6.4* 7.1  ALBUMIN 3.7 3.5 3.1*   No results for input(s): LIPASE, AMYLASE in the last 168  hours. No results for input(s): AMMONIA in the last 168 hours.  ABG    Component Value Date/Time   PHART 7.506 (H) 09/10/2018 0359   PCO2ART 32.5 09/10/2018 0359   PO2ART 177.0 (H) 09/10/2018 0359   HCO3 25.7 09/10/2018 0359   TCO2 27 09/10/2018 0359   ACIDBASEDEF 1.0 09/09/2018 0427   O2SAT 100.0 09/10/2018 0359     Coagulation Profile: Recent Labs  Lab 09/08/18 2037 09/12/18 0047  INR 1.4* 1.2    Cardiac Enzymes: No results for input(s): CKTOTAL, CKMB, CKMBINDEX, TROPONINI in the last 168 hours.  HbA1C: Hgb A1c MFr Bld  Date/Time Value Ref Range Status  10/15/2015 09:06 AM 4.7 (L) 4.8 - 5.6 % Final    Comment:    (NOTE)         Pre-diabetes: 5.7 - 6.4         Diabetes: >6.4         Glycemic control for adults with diabetes: <7.0   10/14/2015 07:01 PM 4.9 4.8 - 5.6 % Final    Comment:    (NOTE)         Pre-diabetes: 5.7 - 6.4         Diabetes: >6.4         Glycemic control for adults with diabetes: <7.0     CBG: Recent Labs  Lab 09/12/18 1538 09/12/18 1933 09/12/18 2315 09/13/18 0351 09/13/18 0732  GLUCAP 92 103* 92 80 110*   Sharrell KuProsper Nery Kalisz, MS4

## 2018-09-13 NOTE — Progress Notes (Signed)
CSW received call from [name] requesting an update as to when the patient would receive his trach. CSW explained to [name] that no updates were available at this time but that he would be given a call closer to the time of the procedure.  Madilyn Fireman, MSW, LCSW-A Clinical Social Worker [name] [phone]

## 2018-09-13 NOTE — Progress Notes (Signed)
Nutrition Follow-up  DOCUMENTATION CODES:   Non-severe (moderate) malnutrition in context of social or environmental circumstances  INTERVENTION:   Resume TF via OGT:   Vital High Protein at 30 ml/h (720 ml per day)   Pro-stat 30 ml TID   Provides 1020 kcal (1628 kcal total with kcal from Propofol), 108 gm protein, 602 ml free water daily   NUTRITION DIAGNOSIS:   Moderate Malnutrition related to social / environmental circumstances(Hx substance abuse) as evidenced by moderate muscle depletion, moderate fat depletion.  Ongoing   GOAL:   Patient will meet greater than or equal to 90% of their needs  Unmet  MONITOR:   Vent status, TF tolerance, Labs  REASON FOR ASSESSMENT:   Ventilator, Consult Enteral/tube feeding initiation and management  ASSESSMENT:   68 yo male admitted with PEA arrest after being found down in a parking lot. Urine screen positive for opiates, ETOH positive. PMH includes alcohol and opiate abuse, CAP.  S/P family meeting 8/13. Family has decided to proceed with tracheostomy and PEG. Unsure timing of these procedures. OG tube in place to suction. ~350 ml output in canister this morning. Spoke with MD, okay to resume TF today.   Patient is currently intubated on ventilator support MV: 7.3 L/min Temp (24hrs), Avg:98.7 F (37.1 C), Min:97.8 F (36.6 C), Max:99.5 F (37.5 C)  Propofol: 23.4 ml/hr providing 618 kcal from lipid  Labs reviewed. Triglycerides 157 (H), magnesium 2.5 (H), potassium 3.3 (L) CBG's: 80-110  Medications reviewed and include folic acid, novolog, thiamine.  TF has been off since 8/9, only received 90 ml on 8/9 when it was initiated.   NUTRITION - FOCUSED PHYSICAL EXAM:    Most Recent Value  Orbital Region  Moderate depletion  Upper Arm Region  Moderate depletion  Thoracic and Lumbar Region  Mild depletion  Buccal Region  Unable to assess  Temple Region  Mild depletion  Clavicle Bone Region  Moderate depletion   Clavicle and Acromion Bone Region  Mild depletion  Scapular Bone Region  Unable to assess  Dorsal Hand  Unable to assess  Patellar Region  Moderate depletion  Anterior Thigh Region  Moderate depletion  Posterior Calf Region  Moderate depletion  Edema (RD Assessment)  None  Hair  Reviewed  Eyes  Unable to assess  Mouth  Unable to assess  Skin  Reviewed  Nails  Unable to assess       Diet Order:   Diet Order            Diet NPO time specified  Diet effective now              EDUCATION NEEDS:   No education needs have been identified at this time  Skin:  Skin Assessment: Reviewed RN Assessment  Last BM:  8/13 type 6  Height:   Ht Readings from Last 1 Encounters:  09/08/18 5\' 8"  (1.727 m)    Weight:   Wt Readings from Last 1 Encounters:  09/13/18 62.1 kg    Ideal Body Weight:  70 kg  BMI:  Body mass index is 20.82 kg/m.  Estimated Nutritional Needs:   Kcal:  1600  Protein:  100-115 gm  Fluid:  >/= 1.7 L    Molli Barrows, RD, LDN, [place] Pager [phone] After Hours Pager [phone]

## 2018-09-14 ENCOUNTER — Other Ambulatory Visit: Payer: Self-pay

## 2018-09-14 DIAGNOSIS — E440 Moderate protein-calorie malnutrition: Secondary | ICD-10-CM | POA: Insufficient documentation

## 2018-09-14 LAB — CULTURE, RESPIRATORY W GRAM STAIN: Special Requests: NORMAL

## 2018-09-14 LAB — CBC WITH DIFFERENTIAL/PLATELET
Abs Immature Granulocytes: 0.04 10*3/uL (ref 0.00–0.07)
Basophils Absolute: 0.1 10*3/uL (ref 0.0–0.1)
Basophils Relative: 1 %
Eosinophils Absolute: 0.7 10*3/uL — ABNORMAL HIGH (ref 0.0–0.5)
Eosinophils Relative: 8 %
HCT: 40.2 % (ref 39.0–52.0)
Hemoglobin: 13.1 g/dL (ref 13.0–17.0)
Immature Granulocytes: 1 %
Lymphocytes Relative: 9 %
Lymphs Abs: 0.8 10*3/uL (ref 0.7–4.0)
MCH: 28.8 pg (ref 26.0–34.0)
MCHC: 32.6 g/dL (ref 30.0–36.0)
MCV: 88.4 fL (ref 80.0–100.0)
Monocytes Absolute: 0.7 10*3/uL (ref 0.1–1.0)
Monocytes Relative: 8 %
Neutro Abs: 6.3 10*3/uL (ref 1.7–7.7)
Neutrophils Relative %: 73 %
Platelets: 217 10*3/uL (ref 150–400)
RBC: 4.55 MIL/uL (ref 4.22–5.81)
RDW: 13.1 % (ref 11.5–15.5)
WBC: 8.5 10*3/uL (ref 4.0–10.5)
nRBC: 0 % (ref 0.0–0.2)

## 2018-09-14 LAB — GLUCOSE, CAPILLARY
Glucose-Capillary: 102 mg/dL — ABNORMAL HIGH (ref 70–99)
Glucose-Capillary: 103 mg/dL — ABNORMAL HIGH (ref 70–99)
Glucose-Capillary: 108 mg/dL — ABNORMAL HIGH (ref 70–99)
Glucose-Capillary: 115 mg/dL — ABNORMAL HIGH (ref 70–99)
Glucose-Capillary: 117 mg/dL — ABNORMAL HIGH (ref 70–99)
Glucose-Capillary: 125 mg/dL — ABNORMAL HIGH (ref 70–99)
Glucose-Capillary: 138 mg/dL — ABNORMAL HIGH (ref 70–99)

## 2018-09-14 LAB — CK TOTAL AND CKMB (NOT AT ARMC)
CK, MB: 1.8 ng/mL (ref 0.5–5.0)
Relative Index: 0.8 (ref 0.0–2.5)
Total CK: 230 U/L (ref 49–397)

## 2018-09-14 LAB — LACTIC ACID, PLASMA: Lactic Acid, Venous: 1.7 mmol/L (ref 0.5–1.9)

## 2018-09-14 LAB — PHOSPHORUS: Phosphorus: 3.2 mg/dL (ref 2.5–4.6)

## 2018-09-14 LAB — MAGNESIUM: Magnesium: 2.4 mg/dL (ref 1.7–2.4)

## 2018-09-14 MED ORDER — FENTANYL 2500MCG IN NS 250ML (10MCG/ML) PREMIX INFUSION
25.0000 ug/h | INTRAVENOUS | Status: DC
  Administered 2018-09-15: 50 ug/h via INTRAVENOUS
  Administered 2018-09-15: 100 ug/h via INTRAVENOUS
  Administered 2018-09-18: 25 ug/h via INTRAVENOUS
  Administered 2018-09-20: 100 ug/h via INTRAVENOUS
  Filled 2018-09-14 (×4): qty 250

## 2018-09-14 MED ORDER — ONDANSETRON HCL 4 MG/2ML IJ SOLN
4.0000 mg | Freq: Four times a day (QID) | INTRAMUSCULAR | Status: DC | PRN
  Administered 2018-09-14 – 2018-09-16 (×3): 4 mg via INTRAVENOUS
  Filled 2018-09-14 (×3): qty 2

## 2018-09-14 MED ORDER — POTASSIUM CHLORIDE 20 MEQ/15ML (10%) PO SOLN
40.0000 meq | Freq: Once | ORAL | Status: AC
  Administered 2018-09-14: 10:00:00 40 meq
  Filled 2018-09-14: qty 30

## 2018-09-14 MED ORDER — INSULIN ASPART 100 UNIT/ML ~~LOC~~ SOLN
2.0000 [IU] | SUBCUTANEOUS | Status: DC
  Administered 2018-09-14 – 2018-09-18 (×5): 2 [IU] via SUBCUTANEOUS
  Administered 2018-09-19: 4 [IU] via SUBCUTANEOUS
  Administered 2018-09-19 – 2018-09-28 (×16): 2 [IU] via SUBCUTANEOUS

## 2018-09-14 MED ORDER — FOLIC ACID 1 MG PO TABS
1.0000 mg | ORAL_TABLET | Freq: Every day | ORAL | Status: DC
  Administered 2018-09-14 – 2018-11-09 (×57): 1 mg
  Filled 2018-09-14 (×58): qty 1

## 2018-09-14 MED ORDER — VITAMIN B-1 100 MG PO TABS
100.0000 mg | ORAL_TABLET | Freq: Every day | ORAL | Status: DC
  Administered 2018-09-14 – 2018-11-09 (×57): 100 mg
  Filled 2018-09-14 (×59): qty 1

## 2018-09-14 MED ORDER — MIDAZOLAM HCL 2 MG/2ML IJ SOLN
1.0000 mg | INTRAMUSCULAR | Status: DC | PRN
  Administered 2018-09-14 (×2): 1 mg via INTRAVENOUS
  Filled 2018-09-14 (×2): qty 2

## 2018-09-14 MED ORDER — PANTOPRAZOLE SODIUM 40 MG PO PACK
40.0000 mg | PACK | ORAL | Status: DC
  Administered 2018-09-14 – 2018-10-27 (×44): 40 mg
  Filled 2018-09-14 (×44): qty 20

## 2018-09-14 MED ORDER — BISACODYL 10 MG RE SUPP
10.0000 mg | Freq: Every day | RECTAL | Status: DC | PRN
  Filled 2018-09-14: qty 1

## 2018-09-14 MED ORDER — SODIUM CHLORIDE 0.9 % IV SOLN
2.0000 g | INTRAVENOUS | Status: DC
  Administered 2018-09-14 – 2018-09-16 (×3): 2 g via INTRAVENOUS
  Filled 2018-09-14 (×3): qty 20

## 2018-09-14 MED ORDER — DOCUSATE SODIUM 50 MG/5ML PO LIQD: 100.0000 mg | Freq: Two times a day (BID) | ORAL | Status: DC | PRN

## 2018-09-14 MED ORDER — FREE WATER
100.0000 mL | Freq: Three times a day (TID) | Status: DC
  Administered 2018-09-14 – 2018-09-15 (×3): 100 mL

## 2018-09-14 MED ORDER — FENTANYL CITRATE (PF) 100 MCG/2ML IJ SOLN
25.0000 ug | INTRAMUSCULAR | Status: DC | PRN
  Administered 2018-09-14 – 2018-09-28 (×37): 100 ug via INTRAVENOUS
  Administered 2018-09-29: 50 ug via INTRAVENOUS
  Administered 2018-09-29 – 2018-10-08 (×17): 100 ug via INTRAVENOUS
  Filled 2018-09-14 (×51): qty 2

## 2018-09-14 NOTE — Progress Notes (Signed)
NAME:  Micheal Carey, MRN:  [id number], DOB:  01/12/1951, LOS: 6 ADMISSION DATE:  09/08/2018, CONSULTATION DATE:  09/08/2018 REFERRING MD:  Dr. Silverio LayYao, ER, CHIEF COMPLAINT:  AMS   Brief History   68 yo male found in parking lot with PEA arrest with unknown downtime.  Required intubation.  UDS positive for opiates and ETOH 276.  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  Past Medical History  COPD, ETOH, opiate abuse  Significant Hospital Events   8/09 Admit 8/11 Fever 102.7  Consults:  Neurology  Procedures:  ETT 8/09 >>  Significant Diagnostic Tests:  Echo 8/10 >> EF 60 to 65%, mild MR EEG 8/12 >> profound, severe encephalopathy MRI brain 8/12 >> hypoxic/ischemic injury, moderate chronic small vessel ischemic disease  Micro Data:  COVID 8/09 >> negative Blood 8/09 >> negative Blood 8/12 >> Sputum 8/12 >> Strep pneumoniae  Antimicrobials:  Vancomycin 8/11  Zosyn 8/11 >> 8/13 Rocephin 8/15 >>   Interim history/subjective:  Remains on vent, sedation.  Objective   Blood pressure (!) 149/83, pulse 78, temperature 97.6 F (36.4 C), temperature source Axillary, resp. rate 12, height 5\' 8"  (1.727 m), weight 62.2 kg, SpO2 100 %.    Vent Mode: PRVC FiO2 (%):  [30 %] 30 % Set Rate:  [12 bmp] 12 bmp Vt Set:  [560 mL] 560 mL PEEP:  [5 cmH20] 5 cmH20 Plateau Pressure:  [16 cmH20-23 cmH20] 16 cmH20   Intake/Output Summary (Last 24 hours) at 09/14/2018 0912 Last data filed at 09/14/2018 0600 Gross per 24 hour  Intake 1089.42 ml  Output 900 ml  Net 189.42 ml   Filed Weights   09/12/18 0500 09/13/18 0336 09/14/18 0500  Weight: 61.8 kg 62.1 kg 62.2 kg    Examination:  General - on vent Eyes - pupils midpoint ENT - ETT in place Cardiac - regular rate/rhythm, no murmur Chest - equal breath sounds b/l, no wheezing or rales Abdomen - soft, non tender, + bowel sounds Extremities - no cyanosis, clubbing, or edema Skin - no rashes Neuro - opens eyes  spontaneously, not following commands, decreased muscle tone     Resolved Hospital Problem list     Assessment & Plan:   Acute hypoxic respiratory failure from accidental opiate/ETOH overdose. Hx of COPD. Failure to wean from ventilator due to mental status. - plan for tracheostomy later this week - f/u CXR intermittently - prn BDs  Pneumococcal pneumonia. 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 - RASS goal 0 - change to prn versed, fentanyl  PEA cardiac arrest after opiate/ETOH induced respiratory failure. - monitor hemodynamics  Hypernatremia. Hypokalemia. - f/u BMET - free water - replace K  Moderate protein calorie malnutrition. - tube feeds - will need PEG tube  Best practice:  Diet: tube feeds DVT prophylaxis: lovenox GI prophylaxis: protonix Mobility: bed rest Code Status: full code Disposition: ICU  Labs    CMP Latest Ref Rng & Units 09/13/2018 09/12/2018 09/11/2018  Glucose 70 - 99 mg/dL 409(W102(H) 119(J117(H) 478(G108(H)  BUN 8 - 23 mg/dL 21 19 13   Creatinine 0.61 - 1.24 mg/dL 9.561.10 2.13(Y1.30(H) 8.65(H1.29(H)  Sodium 135 - 145 mmol/L 148(H) 143 140  Potassium 3.5 - 5.1 mmol/L 3.3(L) 3.3(L) 4.0  Chloride 98 - 111 mmol/L 117(H) 109 107  CO2 22 - 32 mmol/L 18(L) 22 20(L)  Calcium 8.9 - 10.3 mg/dL 8.4(O8.7(L) 8.3(L) 8.5(L)  Total Protein 6.5 - 8.1 g/dL - 7.1 -  Total Bilirubin  0.3 - 1.2 mg/dL - 2.1(H) -  Alkaline Phos 38 - 126 U/L - 64 -  AST 15 - 41 U/L - 48(H) -  ALT 0 - 44 U/L - 22 -   CBC Latest Ref Rng & Units 09/14/2018 09/13/2018 09/12/2018  WBC 4.0 - 10.5 K/uL 8.5 9.4 14.0(H)  Hemoglobin 13.0 - 17.0 g/dL 13.1 13.0 12.9(L)  Hematocrit 39.0 - 52.0 % 40.2 39.4 38.0(L)  Platelets 150 - 400 K/uL 217 179 216   CBG (last 3)  Recent Labs    09/14/18 0022 09/14/18 0406 09/14/18 0728  GLUCAP 108* 115* 103*   ABG    Component Value Date/Time   PHART 7.506 (H) 09/10/2018 0359   PCO2ART 32.5 09/10/2018 0359   PO2ART 177.0 (H)  09/10/2018 0359   HCO3 25.7 09/10/2018 0359   TCO2 27 09/10/2018 0359   ACIDBASEDEF 1.0 09/09/2018 0427   O2SAT 100.0 09/10/2018 0359   CC time 32 minutes  Chesley Mires, MD [place] 09/14/2018, 9:25 AM

## 2018-09-14 NOTE — Progress Notes (Addendum)
Pt w/ pronounced vomiting at 1600. Placed to suction and updated MD. Holding TF and supplement for 4 hours; zofran given and fent 100 mcg for increased clamping down on ETT, posturing, gaging and couphing. Appears improved with sedation, though still with occasional posturing. Bite block placed to prevent clamping ETT.

## 2018-09-14 NOTE — Progress Notes (Signed)
[facility] Progress Note Patient Name: Seena Face DOB: May 28, 1950 MRN: [id number]   Date of Service  09/14/2018  HPI/Events of Note  RN called to let me know that sedation was stopped today but patient has been getting 100 microgram IV fentanyl pushes per hour, along with PRN versed pushes as well. Very uncomfortable looking on the vent, not following commands but with vent dys-synchrony  eICU Interventions  Will resume fentanyl infusion and try to limit dosing to just what is needed to keep the patient calm and synchronous with vent Will DC versed for now  Discussed with RN and saw the patient on camera      Intervention Category Major Interventions: Respiratory failure - evaluation and management Intermediate Interventions: Change in mental status - evaluation and management  Pax Reasoner G Delphin Funes 09/14/2018, 11:50 PM

## 2018-09-14 NOTE — Progress Notes (Signed)
Pt w/ "convulsive" couphing and gaging. Fent 100 mcg given for 3rd dose over past 2.5 hours.

## 2018-09-15 LAB — GLUCOSE, CAPILLARY
Glucose-Capillary: 107 mg/dL — ABNORMAL HIGH (ref 70–99)
Glucose-Capillary: 117 mg/dL — ABNORMAL HIGH (ref 70–99)
Glucose-Capillary: 118 mg/dL — ABNORMAL HIGH (ref 70–99)
Glucose-Capillary: 118 mg/dL — ABNORMAL HIGH (ref 70–99)
Glucose-Capillary: 86 mg/dL (ref 70–99)
Glucose-Capillary: 91 mg/dL (ref 70–99)

## 2018-09-15 LAB — BASIC METABOLIC PANEL
Anion gap: 10 (ref 5–15)
Anion gap: 12 (ref 5–15)
BUN: 18 mg/dL (ref 8–23)
BUN: 18 mg/dL (ref 8–23)
CO2: 21 mmol/L — ABNORMAL LOW (ref 22–32)
CO2: 20 mmol/L — ABNORMAL LOW (ref 22–32)
Calcium: 9 mg/dL (ref 8.9–10.3)
Calcium: 8.9 mg/dL (ref 8.9–10.3)
Chloride: 122 mmol/L — ABNORMAL HIGH (ref 98–111)
Chloride: 123 mmol/L — ABNORMAL HIGH (ref 98–111)
Creatinine, Ser: 0.88 mg/dL (ref 0.61–1.24)
Creatinine, Ser: 0.87 mg/dL (ref 0.61–1.24)
GFR calc Af Amer: >60 mL/min (ref >60)
GFR calc Af Amer: >60 mL/min (ref >60)
GFR calc non Af Amer: >60 mL/min (ref >60)
GFR calc non Af Amer: >60 mL/min (ref >60)
Glucose, Bld: 120 mg/dL — ABNORMAL HIGH (ref 70–99)
Glucose, Bld: 123 mg/dL — ABNORMAL HIGH (ref 70–99)
Potassium: 2.9 mmol/L — ABNORMAL LOW (ref 3.5–5.1)
Potassium: 3.6 mmol/L (ref 3.5–5.1)
Sodium: 153 mmol/L — ABNORMAL HIGH (ref 135–145)
Sodium: 155 mmol/L — ABNORMAL HIGH (ref 135–145)

## 2018-09-15 MED ORDER — POTASSIUM CHLORIDE 20 MEQ/15ML (10%) PO SOLN
40.0000 meq | Freq: Once | ORAL | Status: AC
  Administered 2018-09-15: 40 meq
  Filled 2018-09-15: qty 30

## 2018-09-15 MED ORDER — NYSTATIN 100000 UNIT/ML MT SUSP
5.0000 mL | Freq: Three times a day (TID) | OROMUCOSAL | Status: AC
  Administered 2018-09-15 – 2018-09-24 (×40): 500000 [IU] via OROMUCOSAL
  Filled 2018-09-15 (×40): qty 5

## 2018-09-15 MED ORDER — CHLORHEXIDINE GLUCONATE CLOTH 2 % EX PADS
6.0000 | MEDICATED_PAD | Freq: Every day | CUTANEOUS | Status: DC
  Administered 2018-09-15: 6 via TOPICAL

## 2018-09-15 MED ORDER — FREE WATER
200.0000 mL | Freq: Three times a day (TID) | Status: DC
  Administered 2018-09-15 – 2018-09-16 (×3): 200 mL

## 2018-09-15 MED ORDER — POTASSIUM CHLORIDE IN NACL 20-0.45 MEQ/L-% IV SOLN
INTRAVENOUS | Status: DC
  Administered 2018-09-15 – 2018-09-16 (×3): via INTRAVENOUS
  Filled 2018-09-15 (×3): qty 1000

## 2018-09-15 NOTE — Progress Notes (Signed)
Orthopedic Tech Progress Note Patient Details:  Micheal Carey [birth year] [id number]  Ortho Devices Type of Ortho Device: Louretta Parma boot Ortho Device/Splint Location: bilateral Ortho Device/Splint Interventions: Adjustment, Application, Ordered   Post Interventions Patient Tolerated: Well Instructions Provided: Care of device, Adjustment of device   [name] 09/15/2018, 10:52 AM

## 2018-09-15 NOTE — Progress Notes (Signed)
NAME:  Micheal Carey, MRN:  [id number], DOB:  [birth year], LOS: 7 ADMISSION DATE:  09/08/2018, CONSULTATION DATE:  09/08/2018 REFERRING MD:  Dr. Silverio LayYao, ER, CHIEF COMPLAINT:  AMS   Brief History   68 yo male found in parking lot with PEA arrest with unknown downtime.  Required intubation.  UDS positive for opiates and ETOH 276.  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  Past Medical History  COPD, ETOH, opiate abuse  Significant Hospital Events   8/09 Admit 8/11 Fever 102.7  Consults:  Neurology  Procedures:  ETT 8/09 >>  Significant Diagnostic Tests:  Echo 8/10 >> EF 60 to 65%, mild MR EEG 8/12 >> profound, severe encephalopathy MRI brain 8/12 >> hypoxic/ischemic injury, moderate chronic small vessel ischemic disease  Micro Data:  COVID 8/09 >> negative Blood 8/09 >> negative Blood 8/12 >> Sputum 8/12 >> Strep pneumoniae  Antimicrobials:  Vancomycin 8/11  Zosyn 8/11 >> 8/13 Rocephin 8/15 >>   Interim history/subjective:  Had episode of vomiting yesterday and tube feeds temporarily held.  More uncomfortable appearing last night and started on fentanyl gtt.  Objective   Blood pressure (!) 144/68, pulse 62, temperature (!) 97.1 F (36.2 C), temperature source Axillary, resp. rate 13, height 5\' 8"  (1.727 m), weight 59.1 kg, SpO2 100 %.    Vent Mode: PRVC FiO2 (%):  [30 %] 30 % Set Rate:  [12 bmp] 12 bmp Vt Set:  [560 mL] 560 mL PEEP:  [5 cmH20] 5 cmH20 Plateau Pressure:  [17 cmH20-18 cmH20] 18 cmH20   Intake/Output Summary (Last 24 hours) at 09/15/2018 0804 Last data filed at 09/15/2018 0700 Gross per 24 hour  Intake 1664.95 ml  Output 1920 ml  Net -255.05 ml   Filed Weights   09/13/18 0336 09/14/18 0500 09/15/18 0450  Weight: 62.1 kg 62.2 kg 59.1 kg    Examination:  General - on vent Eyes - pupils midpoint ENT - ETT in place Cardiac - regular rate/rhythm, no murmur Chest - equal breath sounds b/l, no wheezing or rales Abdomen -  soft, non tender, + bowel sounds Extremities - no cyanosis, clubbing, or edema Skin - no rashes Neuro - doesn't follow commands   Resolved Hospital Problem list     Assessment & Plan:   Acute hypoxic respiratory failure from accidental opiate/ETOH overdose. Hx of COPD. Failure to wean from ventilator due to mental status. - plan for trach later this week - f/u CXR intermittently - prn BDs  Pneumococcal pneumonia. 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 - RASS goal 0 - fentanyl gtt with prn versed  PEA cardiac arrest after opiate/ETOH induced respiratory failure. - monitor hemodynamics  Hypernatremia. Hypokalemia. - f/u BMET - increase free water to 200 ml q8h - continue D5 1/2 NS - replace K  Moderate protein calorie malnutrition. Vomiting episode 8/15. - tube feeds - will need PEG tube - prn zofran  Thrush. - day 2 of nystatin  Best practice:  Diet: tube feeds DVT prophylaxis: lovenox GI prophylaxis: protonix Mobility: bed rest Code Status: full code Disposition: ICU  Labs    CMP Latest Ref Rng & Units 09/15/2018 09/13/2018 09/12/2018  Glucose 70 - 99 mg/dL 409(W120(H) 119(J102(H) 478(G117(H)  BUN 8 - 23 mg/dL 18 21 19   Creatinine 0.61 - 1.24 mg/dL 9.560.88 2.131.10 0.86(V1.30(H)  Sodium 135 - 145 mmol/L 153(H) 148(H) 143  Potassium 3.5 - 5.1 mmol/L 2.9(L) 3.3(L) 3.3(L)  Chloride 98 - 111 mmol/L 122(H)  117(H) 109  CO2 22 - 32 mmol/L 21(L) 18(L) 22  Calcium 8.9 - 10.3 mg/dL 9.0 8.7(L) 8.3(L)  Total Protein 6.5 - 8.1 g/dL - - 7.1  Total Bilirubin 0.3 - 1.2 mg/dL - - 2.1(H)  Alkaline Phos 38 - 126 U/L - - 64  AST 15 - 41 U/L - - 48(H)  ALT 0 - 44 U/L - - 22   CBC Latest Ref Rng & Units 09/14/2018 09/13/2018 09/12/2018  WBC 4.0 - 10.5 K/uL 8.5 9.4 14.0(H)  Hemoglobin 13.0 - 17.0 g/dL 13.1 13.0 12.9(L)  Hematocrit 39.0 - 52.0 % 40.2 39.4 38.0(L)  Platelets 150 - 400 K/uL 217 179 216   CBG (last 3)  Recent Labs    09/14/18 2350 09/15/18  0401 09/15/18 0727  GLUCAP 125* 107* 117*   ABG    Component Value Date/Time   PHART 7.506 (H) 09/10/2018 0359   PCO2ART 32.5 09/10/2018 0359   PO2ART 177.0 (H) 09/10/2018 0359   HCO3 25.7 09/10/2018 0359   TCO2 27 09/10/2018 0359   ACIDBASEDEF 1.0 09/09/2018 0427   O2SAT 100.0 09/10/2018 [place], MD [place] 09/15/2018, 8:04 AM

## 2018-09-15 NOTE — Progress Notes (Signed)
Assisted tele visit to patient with daughter.  Brinly Maietta P, RN  

## 2018-09-15 NOTE — Progress Notes (Signed)
[facility] Progress Note Patient Name: Micheal Carey DOB: 22-Apr-1950 MRN: [id number]   Date of Service  09/15/2018  HPI/Events of Note  Oral thrush  eICU Interventions  Nystatin ordered     Intervention Category Intermediate Interventions: Other:  Margaretmary Lombard 09/15/2018, 6:30 AM

## 2018-09-15 NOTE — Progress Notes (Signed)
eLink Physician-Brief Progress Note Patient Name: Micheal Carey DOB: Nov 13, 1950 MRN: [id number]   Date of Service  09/15/2018  HPI/Events of Note  AM BMP- sodium 153, K is 2.9  eICU Interventions  Add 1/2 ns with 20 meq Kcl at 75 cc/hour Add 40 meq oral potassium via NG tube Check repeat at 11 am      Intervention Category Major Interventions: Electrolyte abnormality - evaluation and management  Margaretmary Lombard 09/15/2018, 6:13 AM

## 2018-09-16 ENCOUNTER — Inpatient Hospital Stay (HOSPITAL_COMMUNITY): Payer: Medicare Other

## 2018-09-16 ENCOUNTER — Encounter (HOSPITAL_COMMUNITY): Payer: Medicare Other

## 2018-09-16 LAB — CULTURE, BLOOD (ROUTINE X 2)
Culture: NO GROWTH
Culture: NO GROWTH
Special Requests: ADEQUATE
Special Requests: ADEQUATE

## 2018-09-16 LAB — PROTIME-INR
INR: 1.2 (ref 0.8–1.2)
Prothrombin Time: 15 seconds (ref 11.4–15.2)

## 2018-09-16 LAB — GLUCOSE, CAPILLARY
Glucose-Capillary: 112 mg/dL — ABNORMAL HIGH (ref 70–99)
Glucose-Capillary: 112 mg/dL — ABNORMAL HIGH (ref 70–99)
Glucose-Capillary: 115 mg/dL — ABNORMAL HIGH (ref 70–99)
Glucose-Capillary: 110 mg/dL — ABNORMAL HIGH (ref 70–99)
Glucose-Capillary: 98 mg/dL (ref 70–99)

## 2018-09-16 LAB — BASIC METABOLIC PANEL
Anion gap: 8 (ref 5–15)
BUN: 15 mg/dL (ref 8–23)
CO2: 22 mmol/L (ref 22–32)
Calcium: 9 mg/dL (ref 8.9–10.3)
Chloride: 124 mmol/L — ABNORMAL HIGH (ref 98–111)
Creatinine, Ser: 0.8 mg/dL (ref 0.61–1.24)
GFR calc Af Amer: >60 mL/min (ref >60)
GFR calc non Af Amer: >60 mL/min (ref >60)
Glucose, Bld: 120 mg/dL — ABNORMAL HIGH (ref 70–99)
Potassium: 3.6 mmol/L (ref 3.5–5.1)
Sodium: 154 mmol/L — ABNORMAL HIGH (ref 135–145)

## 2018-09-16 LAB — APTT: aPTT: 40 seconds — ABNORMAL HIGH (ref 24–36)

## 2018-09-16 LAB — MAGNESIUM: Magnesium: 2.1 mg/dL (ref 1.7–2.4)

## 2018-09-16 MED ORDER — PROPOFOL 10 MG/ML IV BOLUS
500.0000 mg | Freq: Once | INTRAVENOUS | Status: DC
  Filled 2018-09-16: qty 60

## 2018-09-16 MED ORDER — VECURONIUM BROMIDE 10 MG IV SOLR
10.0000 mg | Freq: Once | INTRAVENOUS | Status: AC
  Administered 2018-09-16: 14:00:00 10 mg via INTRAVENOUS
  Filled 2018-09-16: qty 10

## 2018-09-16 MED ORDER — FREE WATER
200.0000 mL | Freq: Four times a day (QID) | Status: DC
  Administered 2018-09-16 – 2018-09-19 (×6): 200 mL

## 2018-09-16 MED ORDER — CHLORHEXIDINE GLUCONATE CLOTH 2 % EX PADS
6.0000 | MEDICATED_PAD | Freq: Every day | CUTANEOUS | Status: DC
  Administered 2018-09-16 – 2018-10-12 (×25): 6 via TOPICAL

## 2018-09-16 MED ORDER — MIDAZOLAM HCL 2 MG/2ML IJ SOLN
2.0000 mg | INTRAMUSCULAR | Status: DC | PRN
  Administered 2018-09-16 (×3): 2 mg via INTRAVENOUS
  Filled 2018-09-16 (×3): qty 2

## 2018-09-16 MED ORDER — SODIUM CHLORIDE 0.9 % IV SOLN
INTRAVENOUS | Status: DC | PRN
  Administered 2018-09-27 – 2018-09-28 (×2): 250 mL via INTRAVENOUS
  Administered 2018-09-30: 02:00:00 200 mL via INTRAVENOUS
  Administered 2018-10-01: 250 mL via INTRAVENOUS
  Administered 2018-10-04: 500 mL via INTRAVENOUS

## 2018-09-16 MED ORDER — ETOMIDATE 2 MG/ML IV SOLN
40.0000 mg | Freq: Once | INTRAVENOUS | Status: AC
  Administered 2018-09-16: 14:00:00 20 mg via INTRAVENOUS
  Filled 2018-09-16: qty 20

## 2018-09-16 MED ORDER — CLONAZEPAM 0.5 MG PO TBDP
0.5000 mg | ORAL_TABLET | Freq: Two times a day (BID) | ORAL | Status: DC
  Administered 2018-09-16 – 2018-09-18 (×5): 0.5 mg
  Filled 2018-09-16 (×5): qty 1

## 2018-09-16 MED ORDER — FENTANYL CITRATE (PF) 100 MCG/2ML IJ SOLN
200.0000 ug | Freq: Once | INTRAMUSCULAR | Status: AC
  Administered 2018-09-16: 100 ug via INTRAVENOUS
  Filled 2018-09-16: qty 4

## 2018-09-16 MED ORDER — MIDAZOLAM HCL 2 MG/2ML IJ SOLN
5.0000 mg | Freq: Once | INTRAMUSCULAR | Status: AC
  Administered 2018-09-16: 2 mg via INTRAVENOUS
  Filled 2018-09-16: qty 6

## 2018-09-16 MED ORDER — KCL IN DEXTROSE-NACL 20-5-0.45 MEQ/L-%-% IV SOLN
INTRAVENOUS | Status: DC
  Administered 2018-09-16 – 2018-09-18 (×4): via INTRAVENOUS
  Filled 2018-09-16 (×5): qty 1000

## 2018-09-16 MED ORDER — OXYCODONE HCL 5 MG PO TABS: 5.0000 mg | ORAL_TABLET | ORAL | Status: DC | PRN

## 2018-09-16 MED ORDER — QUETIAPINE FUMARATE 50 MG PO TABS
50.0000 mg | ORAL_TABLET | Freq: Two times a day (BID) | ORAL | Status: DC
  Administered 2018-09-16 – 2018-09-18 (×5): 50 mg
  Filled 2018-09-16 (×5): qty 1

## 2018-09-16 NOTE — Evaluation (Signed)
Occupational Therapy Evaluation Patient Details Name: Micheal Carey MRN: [id number] DOB: 06-08-1950 Today's Date: 09/16/2018    History of Present Illness Pt is a 68 year old man admitted 09/08/18 after found down with PEA arrest, unknown down time. Pt intubated. +severe hypoxic encephalopathy, ETOH and opiates.   Clinical Impression   Pt assumed to be independent prior to admission. Referred to OT specifically for splint evaluation. Pt is dependent in all ADL and demonstrates posturing of UEs with flexion of hands and would benefit from resting hand splints at night. Requested order from MD for pre-fabricated splints. Will follow to monitor fit and tolerance.     Follow Up Recommendations  SNF;LTACH;Supervision/Assistance - 24 hour(per RN, pt to be transported to [place] near family)    Equipment Recommendations       Recommendations for Other Services       Precautions / Restrictions Precautions Precautions: Fall;Other (comment) Precaution Comments: ventilator Restrictions Weight Bearing Restrictions: No      Mobility Bed Mobility               General bed mobility comments: total assist  Transfers                 General transfer comment: deferred    Balance                                           ADL either performed or assessed with clinical judgement   ADL                                         General ADL Comments: requires total care     Vision   Additional Comments: does not respond to threat or track     Perception     Praxis      Pertinent Vitals/Pain Pain Assessment: Faces Faces Pain Scale: No hurt     Hand Dominance     Extremity/Trunk Assessment Upper Extremity Assessment Upper Extremity Assessment: RUE deficits/detail;LUE deficits/detail RUE Deficits / Details: grimaces with shoulder PROM, postures in elbow extension and shoulder adduction, flexed fingers are easily ranged RUE Coordination:  decreased fine motor;decreased gross motor LUE Deficits / Details: same as right LUE Coordination: decreased fine motor;decreased gross motor   Lower Extremity Assessment Lower Extremity Assessment: Generalized weakness       Communication Communication Communication: Other (comment)(intubated)   Cognition Arousal/Alertness: Awake/alert Behavior During Therapy: Flat affect Overall Cognitive Status: Impaired/Different from baseline Area of Impairment: Following commands                       Following Commands: (does not follow commands)       General Comments: responds to pain stimuli with grimacing   General Comments       Exercises     Shoulder Instructions      Home Living Family/patient expects to be discharged to:: Skilled nursing facility Living Arrangements: Alone                                      Prior Functioning/Environment Level of Independence: Independent  OT Problem List: Decreased range of motion;Decreased cognition;Impaired tone      OT Treatment/Interventions: Splinting;Therapeutic exercise    OT Goals(Current goals can be found in the care plan section) Acute Rehab OT Goals OT Goal Formulation: Patient unable to participate in goal setting Time For Goal Achievement: 09/30/18 Potential to Achieve Goals: Good ADL Goals Additional ADL Goal #1: Pt will tolerate B resting hand splints at night without pressure areas or indication of pain. Additional ADL Goal #2: Pt will tolerate B UE PROM.  OT Frequency: Min 2X/week   Barriers to D/C:            Co-evaluation              AM-PAC OT "6 Clicks" Daily Activity     Outcome Measure Help from another person eating meals?: Total Help from another person taking care of personal grooming?: Total Help from another person toileting, which includes using toliet, bedpan, or urinal?: Total Help from another person bathing (including washing,  rinsing, drying)?: Total Help from another person to put on and taking off regular upper body clothing?: Total Help from another person to put on and taking off regular lower body clothing?: Total 6 Click Score: 6   End of Session    Activity Tolerance: Patient tolerated treatment well Patient left: in bed;with call bell/phone within reach;with nursing/sitter in room  OT Visit Diagnosis: Muscle weakness (generalized) (M62.81)                Time: [id number] OT Time Calculation (min): 12 min Charges:  OT General Charges $OT Visit: 1 Visit OT Evaluation $OT Eval High Complexity: 1 High  Martie RoundJulie Deontray Hunnicutt, OTR/L Acute Rehabilitation Services Pager: [phone] Office: [phone]  [name], Azaleah Usman Lynn 09/16/2018, 10:00 AM

## 2018-09-16 NOTE — Procedures (Signed)
Percutaneous Tracheostomy Placement  Consent from family.  Patient sedated, paralyzed and position.  Placed on 100% FiO2 and RR matched.  Area cleaned and draped.  Lidocaine/epi injected.  Skin incision done followed by blunt dissection.  Trachea palpated then punctured, catheter passed and visualized bronchoscopically.  Wire placed and visualized.  Catheter removed.  Airway then entered and dilated.  Size 6 cuffed shiley trach placed and visualized bronchoscopically well above carina.  Good volume returns.  Patient tolerated the procedure well without complications.  Minimal blood loss.  CXR ordered and pending.  Pete Babcock, NP as first assist  Elly Haffey G. Catherin Doorn, M.D. [facility]. Pager: [phone]. After hours pager: [phone].  

## 2018-09-16 NOTE — Progress Notes (Signed)
Orthopedic Tech Progress Note Patient Details:  [name] [birth year] [id number] Called in order to [facility] for Micheal Carey Patient ID: Micheal Carey, male   DOB: 1950-08-17, 68 y.o.   MRN: [id number]   [name] 09/16/2018, 11:42 AM

## 2018-09-16 NOTE — Progress Notes (Signed)
NAME:  Micheal Carey, MRN:  [id number], DOB:  30-Jun-1950, LOS: 8 ADMISSION DATE:  09/08/2018, CONSULTATION DATE:  09/08/2018 REFERRING MD:  EDP Silverio Lay- Yao, CHIEF COMPLAINT:  Cardiac arrest and acute respiratory failure   Brief History   Mr. Micheal Carey is a 68 year old male with PMH of COPD, ETOH and opiate abuse who was found down in the parking lot with PEA and now s/p CPR and intubation and admitted for acute respiratory failure with hypoxemia.  History of present illness   68 year old male with history of ETOH and opiate abuse who was found down in the parking lot where he was found unresponsive by bystanders. Cardiac rhythm showed asystole first then PEA. CPR was done for 10 minutes with 1 epi and he pulses returned. Unknown down time before CPR. He became hemodynamically stable but remained completely unresponsive. Patient had a palpable pulse on arrival with some spontaneous movements but did not follow directions. Upon review of his chart, it was found that patient has hx of drug and alcohol problems. Patient was intubated in the ED. His alcohol level was 276 and UDS + for opiates. COVID negative. Suspect drug overdose as the cause of AMS. PCCM was called on consultation.   Past Medical History  Substance abuse, COPD  Significant Hospital Events   CPR 8/9>>> Intubation 8/9 >>>  Consults:  PCCM Neurology  Procedures:  ETT 8/9>>>  Significant Diagnostic Tests:  CXR 8/9 >>> Low lung volume, no acute cardiopulmonary dz, ETT w/ tip 4.8 cm above carina Head CT 8/9 >>> No acute intracranial abnormality. CT C-spine >>> No acute cervical spine fracture. CXR 8/9 >>> I reviewed myself, ETT is in a good place CXR 8/10 >>> No acute cardiopulmonary abnormality. ETT stable and in appropriate location EEG 8/10 >>> Suggestive of profound severe encephalopathy. No seizures or epileptiform discharges  TTE ECHO 8/10 >>> Normal LVSF, EF 60-65%, mild MR CXR 8/11 >>> ET in stable position. NGT slightly  withdrawn, Suggested advancement of ~10 cm. No acute cardiopulmonary disease. MRI brain 8/12 >>> Supratentorial and infratentorial diffusion-weighted and T2/FLAIR signal abnormality c/w hypoxic/ischemic injury. Moderate chronic small vessel ischemic disease. EEG 8/12 >>> Suggestive of profound severe encephalopathy. No seizures or clear epileptiform discharges  CXR 8/13 >>> No edema or consolidation. ETT tip 4.9 cm above the carina CXR 8/14 >>> No acute abnormality  Micro Data:  SARS coronavirus 2 8/9 >>> Negative MRSA PCR 8/9 >>> Negative Blood 8/9>>> No growth in 24 hr Urine 8/9>>> No growth Blood 8/12 >>> No growth in 5 day Respiratory culture 8/12 >>> few strep pneumo     Gram stain >>> Mod WBC, mostly PMNs, few gram + cocci, rare gram + rods  Antimicrobials:  Zosyn 8/12 >>> 8/13 Vancomycin 8/12 >>> 8/14 Rocephin 8/15 >>> 8/17 Interim history/subjective:  Over the weekend, patient continued to be agitated and gagged over vent requiring fentanyl drip and PRN versed. He developed hypernatremia and hypokalemia, which are being addressed with free water and D5 1/2NS w/ K+. He was started on rocephin for pneumococcal pneumonia.    Objective   Blood pressure (!) 151/77, pulse 75, temperature 98.5 F (36.9 C), temperature source Oral, resp. rate 12, height 5\' 8"  (1.727 m), weight 60.4 kg, SpO2 100 %.    Vent Mode: PRVC FiO2 (%):  [30 %] 30 % Set Rate:  [12 bmp] 12 bmp Vt Set:  [560 mL] 560 mL PEEP:  [5 cmH20] 5 cmH20 Plateau Pressure:  [14 cmH20-20 cmH20] 16  cmH20   Intake/Output Summary (Last 24 hours) at 09/16/2018 0821 Last data filed at 09/16/2018 0800 Gross per 24 hour  Intake 3661.91 ml  Output 1595 ml  Net 2066.91 ml   Filed Weights   09/14/18 0500 09/15/18 0450 09/16/18 0359  Weight: 62.2 kg 59.1 kg 60.4 kg   Examination: General: Acutely ill appearing male, grimacing to pain. HENT: [place]/AT, PERRL, ETT in place Lungs: Intubated, CTAB, no increased WOB. No wheezing  Cardiovascular: tachycardic, normal rhythm, Nl S1/S2, no m/r/g Abdomen: Soft, NT, ND and +BS Extremities: Well-perfused. Distal pulses 2+. No LE edema Neuro: On vent, obtuse, gag present, pupils sluggish reaction to light, does not follow commands, moves all extremities spontaneously. Corneal reflex in touch. Reacts to painful stimuli Skin: Warm. Well-perfused  Resolved Hospital Problem list   N/A  Assessment & Plan:  68 year old male with hx of COPD and substance abuse found in cardiac arrest likely 2/2 to drug overdose, now s/p resuscitation and intubation on vent management w/ acute respiratory failure and altered mental status.  Acute respiratory failure likely due to heroine overdose - Mental status precludes weaning off vent - Continue full vent support FiO2 30, Peep 5, RR 12, TV 560 - Wean as tolerated - PRN albuterol - On fentanyl drip and PRN versed - F/u CXR 8/18 AM - Trach placement today  Cardiac arrest: likely secondary to respiratory arrest from overdose - Continue tele monitoring - Off pressors - Continue IVF @ 75 mL/hr  Acute anoxic encephalopathy - Head CT w/ no acute intracranial abnormality - EEG 8/10 suggestive of profound severe encephalopathy. No seizures or epileptiform discharges, repeat EEG 8/12 no change from previous - Brain MRI shows DWI and FLAIR changes suggestive of global hypoxic ischemic injury. - Now responding to painful stimuli today - Continues to fail weaning due to gagging and coughing fits - Plan for trach today w/ Dr. Molli KnockYacoub  Pneumococcal pneumonia - Resp culture grew few strep pneumo - Started on rocephin on 8/15 - WBC now at 8.5, remains afebrile - D/c rocephin  AKI: Stable  - Developed hypernatremia over the weekend - Na up to 154 today, on 1/2NS @ 75/mL/hr - K+ and Mg improved - Continue 1/2NS 75 ml/hr and free water  FEN: - Nutrition for TF - IV Protonix - SCD's - Social work to get family for plan of care - Bedrest   Labs   CBC: Recent Labs  Lab 09/10/18 0359 09/10/18 0402 09/12/18 0047 09/13/18 0328 09/14/18 0357  WBC  --  12.1* 14.0* 9.4 8.5  NEUTROABS  --   --  11.5* 6.6 6.3  HGB 13.6 13.2 12.9* 13.0 13.1  HCT 40.0 38.7* 38.0* 39.4 40.2  MCV  --  84.5 84.4 86.0 88.4  PLT  --  223 216 179 217    Basic Metabolic Panel: Recent Labs  Lab 09/10/18 0402  09/12/18 0047 09/13/18 0328 09/13/18 1141 09/14/18 0357 09/15/18 0420 09/15/18 1207 09/16/18 0602  NA 139   < > 143  --  148*  --  153* 155* 154*  K 3.2*   < > 3.3*  --  3.3*  --  2.9* 3.6 3.6  CL 106   < > 109  --  117*  --  122* 123* 124*  CO2 22   < > 22  --  18*  --  21* 20* 22  GLUCOSE 118*   < > 117*  --  102*  --  120* 123* 120*  BUN  5*   < > 19  --  21  --  18 18 15   CREATININE 0.88   < > 1.30*  --  1.10  --  0.88 0.87 0.80  CALCIUM 8.4*   < > 8.3*  --  8.7*  --  9.0 8.9 9.0  MG 2.2  --  2.3 2.5*  --  2.4  --   --  2.1  PHOS 1.9*  --  3.0 2.6  --  3.2  --   --   --    < > = values in this interval not displayed.   GFR: Estimated Creatinine Clearance: 78.6 mL/min (by C-G formula based on SCr of 0.8 mg/dL). Recent Labs  Lab 09/10/18 0402 09/10/18 1153 09/12/18 0047 09/13/18 0328 09/14/18 0357  WBC 12.1*  --  14.0* 9.4 8.5  LATICACIDVEN  --  1.3  --   --  1.7    Liver Function Tests: Recent Labs  Lab 09/12/18 0047  AST 48*  ALT 22  ALKPHOS 64  BILITOT 2.1*  PROT 7.1  ALBUMIN 3.1*   No results for input(s): LIPASE, AMYLASE in the last 168 hours. No results for input(s): AMMONIA in the last 168 hours.  ABG    Component Value Date/Time   PHART 7.506 (H) 09/10/2018 0359   PCO2ART 32.5 09/10/2018 0359   PO2ART 177.0 (H) 09/10/2018 0359   HCO3 25.7 09/10/2018 0359   TCO2 27 09/10/2018 0359   ACIDBASEDEF 1.0 09/09/2018 0427   O2SAT 100.0 09/10/2018 0359     Coagulation Profile: Recent Labs  Lab 09/12/18 0047  INR 1.2    Cardiac Enzymes: Recent Labs  Lab 09/14/18 0357  CKTOTAL 230  CKMB 1.8     HbA1C: Hgb A1c MFr Bld  Date/Time Value Ref Range Status  10/15/2015 09:06 AM 4.7 (L) 4.8 - 5.6 % Final    Comment:    (NOTE)         Pre-diabetes: 5.7 - 6.4         Diabetes: >6.4         Glycemic control for adults with diabetes: <7.0   10/14/2015 07:01 PM 4.9 4.8 - 5.6 % Final    Comment:    (NOTE)         Pre-diabetes: 5.7 - 6.4         Diabetes: >6.4         Glycemic control for adults with diabetes: <7.0     CBG: Recent Labs  Lab 09/15/18 1548 09/15/18 1940 09/15/18 2332 09/16/18 0406 09/16/18 0733  GLUCAP 118* 91 86 112* 112*   Linwood Dibbles, MS4

## 2018-09-16 NOTE — Progress Notes (Signed)
[facility] Progress Note Patient Name: Briton Sellman DOB: 1950/11/08 MRN: [id number]   Date of Service  09/16/2018  HPI/Events of Note  Agitation on ventilator. MD notes mention fentanyl drip and PRN versed but no order noted for versed  eICU Interventions  Versed PRN ordered      Intervention Category Major Interventions: Delirium, psychosis, severe agitation - evaluation and management  Margaretmary Lombard 09/16/2018, 5:37 AM

## 2018-09-16 NOTE — Procedures (Signed)
Bedside Tracheostomy Insertion Procedure Note   Patient Details:   Name: Micheal Carey DOB: Jun 08, 1950 MRN: [id number]  Procedure: Tracheostomy  Pre Procedure Assessment: ET Tube Size: 8.0 ET Tube secured at lip (cm): 27 Bite block in place: No Breath Sounds: Rhonch  Post Procedure Assessment: BP (!) 163/82   Pulse 80   Temp 97.7 F (36.5 C) (Oral)   Resp 15   Ht 5\' 8"  (1.727 m)   Wt 60.4 kg   SpO2 100%   BMI 20.25 kg/m  O2 sats: stable throughout Complications: No apparent complications Patient did tolerate procedure well Tracheostomy Brand:Shiley Tracheostomy Style:Cuffed Tracheostomy Size: 6 Tracheostomy Secured JME:QASTMHD, velcro Tracheostomy Placement Confirmation:Trach cuff visualized and in place and Chest X ray ordered for placement    Kathie Dike 09/16/2018, 1:44 PM

## 2018-09-16 NOTE — Procedures (Signed)
Cortrak  Tube Type:  Cortrak - 43 inches Tube Location:  Right nare Initial Placement:  Stomach Secured by: Bridle Technique Used to Measure Tube Placement:  Documented cm marking at nare/ corner of mouth Cortrak Secured At:  70 cm    Cortrak Tube Team Note:  Consult received to place a Cortrak feeding tube.   No x-ray is required. RN may begin using tube.   If the tube becomes dislodged please keep the tube and contact the Cortrak team at [email or web address] (password [email or web address]) for replacement.  If after hours and replacement cannot be delayed, place a NG tube and confirm placement with an abdominal x-ray.    Koleen Distance MS, RD, LDN Pager #- [phone] Office#- [phone] After Hours Pager: [phone]

## 2018-09-16 NOTE — Procedures (Signed)
Bronchoscopy Procedure Note Micheal Carey [id number] 1950-06-01  Procedure: Bronchoscopy Indications: Diagnostic evaluation of the airways  Procedure Details Consent: Risks of procedure as well as the alternatives and risks of each were explained to the (patient/caregiver).  Consent for procedure obtained. Time Out: Verified patient identification, verified procedure, site/side was marked, verified correct patient position, special equipment/implants available, medications/allergies/relevent history reviewed, required imaging and test results available.  Performed  In preparation for procedure, patient was given 100% FiO2. Sedation: Benzodiazepines, Muscle relaxants, Etomidate and fentanyl  Airway entered and the following bronchi were examined: RUL, RML, RLL, LUL, LLL and Bronchi.    Bronchoscope removed.  , Patient placed back on 100% FiO2 at conclusion of procedure.    Evaluation Hemodynamic Status: BP stable throughout; O2 sats: stable throughout Patient's Current Condition: stable Specimens:  None Complications: No apparent complications Patient did tolerate procedure well.   Micheal Carey Micheal Carey 09/16/2018

## 2018-09-16 NOTE — Progress Notes (Signed)
SLP Cancellation Note  Patient Details Name: Micheal Carey MRN: [id number] DOB: 1950-10-30   Cancelled treatment:       Reason Eval/Treat Not Completed: Other (comment) Patient with new tracheostomy. Orders for SLP eval and treat for PMSV and swallowing received. Per RN, pt has not been following commands. Will follow pt closely for readiness for SLP interventions as appropriate.     Venita Sheffield Horst Ostermiller 09/16/2018, 2:16 PM  Pollyann Glen, M.A. [place] Acute Environmental education officer [phone] Office [phone]

## 2018-09-16 NOTE — Progress Notes (Deleted)
[place] Progress Note Patient Name: Micheal Carey DOB: Aug 09, 1950 MRN: [id number]   Date of Service  09/16/2018  HPI/Events of Note  Self extubated and I saw him immediately on camera. RN had turned off propofol. Patient fully awake, alert, laughing, saying he now feels better,. No chest pain, good cough, able to lift arms easily. Being suctioned and very cooperative. On 6 liter nasal o2, his O2 sats are 97.   eICU Interventions  NPO Close monitoring ABG at 3 am Plz call us with results  We may use BIPAP if his condition declines. Certainly does not need re intubation at this time and continue to observe     Intervention Category Major Interventions: Respiratory failure - evaluation and management  Naila Elizondo G Jalesa Thien 09/16/2018, 1:50 AM

## 2018-09-17 DIAGNOSIS — J9602 Acute respiratory failure with hypercapnia: Secondary | ICD-10-CM

## 2018-09-17 LAB — GLUCOSE, CAPILLARY
Glucose-Capillary: 103 mg/dL — ABNORMAL HIGH (ref 70–99)
Glucose-Capillary: 104 mg/dL — ABNORMAL HIGH (ref 70–99)
Glucose-Capillary: 121 mg/dL — ABNORMAL HIGH (ref 70–99)
Glucose-Capillary: 129 mg/dL — ABNORMAL HIGH (ref 70–99)
Glucose-Capillary: 140 mg/dL — ABNORMAL HIGH (ref 70–99)
Glucose-Capillary: 97 mg/dL (ref 70–99)

## 2018-09-17 LAB — BASIC METABOLIC PANEL
Anion gap: 6 (ref 5–15)
BUN: 14 mg/dL (ref 8–23)
CO2: 23 mmol/L (ref 22–32)
Calcium: 8.7 mg/dL — ABNORMAL LOW (ref 8.9–10.3)
Chloride: 121 mmol/L — ABNORMAL HIGH (ref 98–111)
Creatinine, Ser: 0.9 mg/dL (ref 0.61–1.24)
GFR calc Af Amer: >60 mL/min (ref >60)
GFR calc non Af Amer: >60 mL/min (ref >60)
Glucose, Bld: 120 mg/dL — ABNORMAL HIGH (ref 70–99)
Potassium: 3 mmol/L — ABNORMAL LOW (ref 3.5–5.1)
Sodium: 150 mmol/L — ABNORMAL HIGH (ref 135–145)

## 2018-09-17 LAB — SARS CORONAVIRUS 2 BY RT PCR (HOSPITAL ORDER, PERFORMED IN ~~LOC~~ HOSPITAL LAB): SARS Coronavirus 2: NEGATIVE

## 2018-09-17 MED ORDER — ONDANSETRON HCL 4 MG PO TABS: 4.0000 mg | ORAL_TABLET | Freq: Four times a day (QID) | ORAL | Status: DC | PRN

## 2018-09-17 MED ORDER — CEFAZOLIN SODIUM-DEXTROSE 2-4 GM/100ML-% IV SOLN
2.0000 g | INTRAVENOUS | Status: AC
  Filled 2018-09-17: qty 100

## 2018-09-17 MED ORDER — ENOXAPARIN SODIUM 40 MG/0.4ML ~~LOC~~ SOLN: 40.0000 mg | SUBCUTANEOUS | Status: DC

## 2018-09-17 MED ORDER — ONDANSETRON HCL 4 MG/2ML IJ SOLN: 4.0000 mg | Freq: Four times a day (QID) | INTRAMUSCULAR | Status: DC | PRN

## 2018-09-17 MED ORDER — POTASSIUM CHLORIDE 20 MEQ/15ML (10%) PO SOLN
30.0000 meq | ORAL | Status: AC
  Administered 2018-09-17 (×2): 30 meq
  Filled 2018-09-17 (×2): qty 30

## 2018-09-17 NOTE — Progress Notes (Signed)
NAME:  Micheal Carey, MRN:  [id number], DOB:  11-28-1950, LOS: 9 ADMISSION DATE:  09/08/2018, CONSULTATION DATE:  09/08/2018 REFERRING MD:  EDP Darl Householder, CHIEF COMPLAINT:  Cardiac arrest and acute respiratory failure   Brief History   Micheal Carey is a 68 year old male with PMH of COPD, ETOH and opiate abuse who was found down in the parking lot with PEA and now s/p CPR and intubation and admitted for acute respiratory failure with hypoxemia.  History of present illness   68 year old male with history of ETOH and opiate abuse who was found down in the parking lot where he was found unresponsive by bystanders. Cardiac rhythm showed asystole first then PEA. CPR was done for 10 minutes with 1 epi and he pulses returned. Unknown down time before CPR. He became hemodynamically stable but remained completely unresponsive. Patient had a palpable pulse on arrival with some spontaneous movements but did not follow directions. Upon review of his chart, it was found that patient has hx of drug and alcohol problems. Patient was intubated in the ED. His alcohol level was 276 and UDS + for opiates. COVID negative. Suspect drug overdose as the cause of AMS. PCCM was called on consultation.   Past Medical History  Substance abuse, COPD  Significant Hospital Events   CPR 8/9 Intubation 8/9 >>> Tracheostomy 8/17 >>> Cortrak 8/17 >>> PEG placement 8/18 >>>  Consults:  PCCM Neurology  Procedures:  ETT 8/9>>> Trach 8/17 >>> Cortrak 8/17  PEG 8/18  Significant Diagnostic Tests:  CXR 8/9 >>> Low lung volume, no acute cardiopulmonary dz, ETT w/ tip 4.8 cm above carina Head CT 8/9 >>> No acute intracranial abnormality. CT C-spine >>> No acute cervical spine fracture. CXR 8/9 >>> I reviewed myself, ETT is in a good place CXR 8/10 >>> No acute cardiopulmonary abnormality. ETT stable and in appropriate location EEG 8/10 >>> Suggestive of profound severe encephalopathy. No seizures or epileptiform discharges  TTE  ECHO 8/10 >>> Normal LVSF, EF 60-65%, mild MR CXR 8/11 >>> ET in stable position. NGT slightly withdrawn, Suggested advancement of ~10 cm. No acute cardiopulmonary disease. MRI brain 8/12 >>> Supratentorial and infratentorial diffusion-weighted and T2/FLAIR signal abnormality c/w hypoxic/ischemic injury. Moderate chronic small vessel ischemic disease. EEG 8/12 >>> Suggestive of profound severe encephalopathy. No seizures or clear epileptiform discharges  CXR 8/13 >>> No edema or consolidation. ETT tip 4.9 cm above the carina CXR 8/14 >>> No acute abnormality CXR 8/17 >>> Satisfactory positioning of tracheostomy tube.  No acute findings.  Micro Data:  SARS coronavirus 2 8/9 >>> Negative MRSA PCR 8/9 >>> Negative Blood 8/9>>> No growth in 24 hr Urine 8/9>>> No growth Blood 8/12 >>> No growth in 5 day Respiratory culture 8/12 >>> few strep pneumo     Gram stain >>> Mod WBC, mostly PMNs, few gram + cocci, rare gram + rods  Antimicrobials:  Zosyn 8/12 >>> 8/13 Vancomycin 8/12 >>> 8/14 Rocephin 8/15 >>> 8/17  Interim history/subjective:  Patient had trach and cortrak placed yesterday with no complications. He had an episode of vomiting overnight but no significant events. His Na+ is improving but K+ is decreasing. Plan to have a PEG place today and discuss disposition with family.  Objective   Blood pressure (!) 156/77, pulse 85, temperature 99.3 F (37.4 C), temperature source Axillary, resp. rate 13, height 5\' 8"  (1.727 m), weight 62.8 kg, SpO2 100 %.    Vent Mode: PRVC FiO2 (%):  [30 %] 30 %  Set Rate:  [12 bmp] 12 bmp Vt Set:  [560 mL] 560 mL PEEP:  [5 cmH20] 5 cmH20 Plateau Pressure:  [16 cmH20-27 cmH20] 16 cmH20   Intake/Output Summary (Last 24 hours) at 09/17/2018 1047 Last data filed at 09/17/2018 1000 Gross per 24 hour  Intake 3197.23 ml  Output 1955 ml  Net 1242.23 ml   Filed Weights   09/16/18 0359 09/16/18 1017 09/17/18 0500  Weight: 60.4 kg 60.4 kg 62.8 kg    Examination: General: Acutely ill appearing male, grimacing to pain. Looks contracted HENT: [place]/AT, PERRL, cortrack and trach in place Lungs: Intubated, CTAB, no increased WOB. No wheezing Cardiovascular: tachycardic, normal rhythm, Nl S1/S2, no m/r/g Abdomen: Soft, NT, ND and +BS Extremities: Well-perfused. Distal pulses 2+. No LE edema Neuro: On vent, pupils sluggish reaction to light, does not follow commands, moves all extremities spontaneously, no purposeful movement. Corneal reflex in touch. Reacts to painful stimuli. Skin: Warm. Well-perfused  Resolved Hospital Problem list   N/A  Assessment & Plan:  68 year old male with hx of COPD and substance abuse found in cardiac arrest likely 2/2 to drug overdose, now s/p resuscitation and intubation on vent management w/ acute respiratory failure and altered mental status.  Acute respiratory failure likely due to heroine overdose - Mental status precludes weaning off vent - Continue full vent support FiO2 30, Peep 5, RR 12, TV 560 - Wean as tolerated - PRN albuterol - Fentanyl drip down to 25 mcg - PEG placement today  Cardiac arrest: likely secondary to respiratory arrest from overdose - Continue tele monitoring - Off pressors - Continue IVF @ 75 mL/hr  Acute anoxic encephalopathy - Head CT w/ no acute intracranial abnormality - EEG 8/10 suggestive of profound severe encephalopathy. No seizures or epileptiform discharges, repeat EEG 8/12 no change from previous - Brain MRI shows DWI and FLAIR changes suggestive of global hypoxic ischemic injury. 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 - Continue 1/2NS @ 75/mL/hr -  Daily BMET  FEN: - Nutrition for TF -Thiamine and folic acid supplementation - IV Protonix - SCD's - Discussing disposition with family - Bedrest  Labs   CBC: Recent Labs  Lab 09/12/18 0047 09/13/18 0328 09/14/18 0357  WBC 14.0* 9.4 8.5  NEUTROABS 11.5* 6.6 6.3  HGB 12.9* 13.0 13.1  HCT 38.0* 39.4 40.2  MCV 84.4 86.0 88.4  PLT 216 179 217    Basic Metabolic Panel: Recent Labs  Lab 09/12/18 0047 09/13/18 0328 09/13/18 1141 09/14/18 0357 09/15/18 0420 09/15/18 1207 09/16/18 0602 09/17/18 0440  NA 143  --  148*  --  153* 155* 154* 150*  K 3.3*  --  3.3*  --  2.9* 3.6 3.6 3.0*  CL 109  --  117*  --  122* 123* 124* 121*  CO2 22  --  18*  --  21* 20* 22 23  GLUCOSE 117*  --  102*  --  120* 123* 120* 120*  BUN 19  --  21  --  18 18 15 14   CREATININE 1.30*  --  1.10  --  0.88 0.87 0.80 0.90  CALCIUM 8.3*  --  8.7*  --  9.0 8.9 9.0 8.7*  MG 2.3 2.5*  --  2.4  --   --  2.1  --   PHOS 3.0 2.6  --  3.2  --   --   --   --    GFR: Estimated Creatinine Clearance: 72.7 mL/min (by C-G formula based on SCr of 0.9 mg/dL). Recent Labs  Lab 09/10/18 1153 09/12/18 0047 09/13/18 0328 09/14/18 0357  WBC  --  14.0* 9.4 8.5  LATICACIDVEN 1.3  --   --  1.7    Liver Function Tests: Recent Labs  Lab 09/12/18 0047  AST 48*  ALT 22  ALKPHOS 64  BILITOT 2.1*  PROT 7.1  ALBUMIN 3.1*   No results for input(s): LIPASE, AMYLASE in the last 168 hours. No results for input(s): AMMONIA in the last 168 hours.  ABG    Component Value Date/Time   PHART 7.506 (H) 09/10/2018 0359   PCO2ART 32.5 09/10/2018 0359   PO2ART 177.0 (H) 09/10/2018 0359   HCO3 25.7 09/10/2018 0359   TCO2 27 09/10/2018 0359   ACIDBASEDEF 1.0 09/09/2018 0427   O2SAT 100.0 09/10/2018 0359     Coagulation Profile: Recent Labs  Lab 09/12/18 0047 09/16/18 1237  INR 1.2 1.2    Cardiac Enzymes: Recent Labs  Lab 09/14/18 0357  CKTOTAL 230  CKMB 1.8    HbA1C: Hgb A1c MFr Bld  Date/Time Value  Ref Range Status  10/15/2015 09:06 AM 4.7 (L) 4.8 - 5.6 % Final    Comment:    (NOTE)         Pre-diabetes: 5.7 - 6.4         Diabetes: >6.4         Glycemic control for adults with diabetes: <7.0   10/14/2015 07:01 PM 4.9 4.8 - 5.6 % Final    Comment:    (NOTE)         Pre-diabetes: 5.7 - 6.4         Diabetes: >6.4         Glycemic control for adults with diabetes: <7.0     CBG: Recent Labs  Lab 09/16/18 1528 09/16/18 1939 09/17/18 0036 09/17/18 0344 09/17/18 0730  GLUCAP 110* 98 121* 140* 97   Sharrell KuProsper Bryan Goin, MS4

## 2018-09-17 NOTE — Progress Notes (Signed)
Occupational Therapy Treatment Patient Details Name: Micheal Carey MRN: [id number] DOB: November 20, 1950 Today's Date: 09/17/2018    History of present illness Pt is a 68 year old man admitted 09/08/18 after found down with PEA arrest, unknown down time. Pt intubated. +severe hypoxic encephalopathy, ETOH and opiates.   OT comments  Pt with lethargy today. Performed B UE PROM. B resting hand splints have not yet been delivered, followed up with ortho tech.   Follow Up Recommendations  SNF;LTACH;Supervision/Assistance - 24 hour(pt to go to [place] near his family)    Equipment Recommendations       Recommendations for Other Services      Precautions / Restrictions Precautions Precautions: Fall;Other (comment) Precaution Comments: trach/ventilator       Mobility Bed Mobility               General bed mobility comments: total assist  Transfers                      Balance                                           ADL either performed or assessed with clinical judgement   ADL                                               Vision       Perception     Praxis      Cognition Arousal/Alertness: Lethargic Behavior During Therapy: Flat affect Overall Cognitive Status: Impaired/Different from baseline Area of Impairment: Following commands                               General Comments: no following commands, eyes closed predominantly today        Exercises Exercises: General Upper Extremity General Exercises - Upper Extremity Shoulder Flexion: PROM;Both;10 reps;Supine Shoulder ABduction: PROM;Both;10 reps;Supine Shoulder Horizontal ABduction: PROM;Both;10 reps;Supine Shoulder Horizontal ADduction: PROM;Both;10 reps;Supine Elbow Flexion: PROM;Both;10 reps;Supine Elbow Extension: PROM;Both;10 reps;Supine Wrist Flexion: PROM;Both;10 reps;Supine Wrist Extension: PROM;Both;10 reps;Supine Composite Extension:  PROM;Both;5 reps;Supine   Shoulder Instructions       General Comments      Pertinent Vitals/ Pain       Pain Assessment: Faces Faces Pain Scale: No hurt  Home Living                                          Prior Functioning/Environment              Frequency  Min 2X/week        Progress Toward Goals  OT Goals(current goals can now be found in the care plan section)  Progress towards OT goals: Not progressing toward goals - comment  Acute Rehab OT Goals OT Goal Formulation: Patient unable to participate in goal setting Time For Goal Achievement: 09/30/18 Potential to Achieve Goals: Good  Plan Discharge plan remains appropriate    Co-evaluation                 AM-PAC OT "6 Clicks" Daily Activity     Outcome Measure  Help from another person eating meals?: Total Help from another person taking care of personal grooming?: Total Help from another person toileting, which includes using toliet, bedpan, or urinal?: Total Help from another person bathing (including washing, rinsing, drying)?: Total Help from another person to put on and taking off regular upper body clothing?: Total Help from another person to put on and taking off regular lower body clothing?: Total 6 Click Score: 6    End of Session    OT Visit Diagnosis: Muscle weakness (generalized) (M62.81)   Activity Tolerance Patient limited by lethargy   Patient Left in bed;with call bell/phone within reach;with nursing/sitter in room   Nurse Communication Other (comment)(hand splints have not arrived)        Time: 6004-5997 OT Time Calculation (min): 16 min  Charges: OT General Charges $OT Visit: 1 Visit OT Treatments $Therapeutic Exercise: 8-22 mins  Nestor Lewandowsky, OTR/L Acute Rehabilitation Services Pager: [phone] Office: [phone]   Malka So 09/17/2018, 11:50 AM

## 2018-09-17 NOTE — Progress Notes (Signed)
[facility] ADULT ICU REPLACEMENT PROTOCOL FOR AM LAB REPLACEMENT ONLY  The patient does apply for the [facility] Adult ICU Electrolyte Replacment Protocol based on the criteria listed below:   1. Is GFR >/= 40 ml/min? Yes.    Patient's GFR today is >60 2. Is urine output >/= 0.5 ml/kg/hr for the last 6 hours? Yes.   Patient's UOP is 0.8 ml/kg/hr 3. Is BUN < 60 mg/dL? Yes.    Patient's BUN today is 14 4. Abnormal electrolyte(s): K-3.0 5. Ordered repletion with: per protocol 6. If a panic level lab has been reported, has the CCM MD in charge been notified? Yes.  .   Physician:  Dr. Terrill Mohr, Philis Nettle 09/17/2018 6:02 AM

## 2018-09-17 NOTE — Progress Notes (Signed)
Patient ID: Micheal Carey, male   DOB: 08-Jan-1951, 68 y.o.   MRN: [id number]   G tube in IR rescheduled secondary to schedule today  We will call for pt 8/19  RN aware

## 2018-09-17 NOTE — Progress Notes (Signed)
I paged and spoke to Jannifer Franklin, PA and informed her that patient did have episode of vomiting this morning around 0700 and it was reported to me that tube feedings were paused at this time. Order received to administer medications per tube. No further orders received at this time.  Myrle Sheng, BSN, RN 44M/85M MICU

## 2018-09-17 NOTE — Progress Notes (Signed)
PT Cancellation Note  Patient Details Name: Micheal Carey MRN: [id number] DOB: November 11, 1950   Cancelled Treatment:    Reason Eval/Treat Not Completed: Lethargy limiting ability to participate. Will monitor for ability to participate.    Shary Decamp [facility] 09/17/2018, 3:34 PM [place] Pager [phone] Office [phone]

## 2018-09-17 NOTE — Progress Notes (Signed)
CSW received call from patient's daughter Micheal Carey requesting an update on patient. CSW informed her that he received his trach yesterday and that PEG placement was scheduled for sometime today. Micheal Carey requested that CSW contact [facility] to determine if and where this patient was receiving support services. CSW and Micheal Carey discussed the criteria for transferring this patient to another facility and she stated understanding. Micheal Carey also stated that there were no financial barriers to transporting the patient via ambulance to [place] if that option is available.  CSW will contact [place] in attempt to determine patient's status there.  Madilyn Fireman, MSW, LCSW-A Clinical Social Worker [facility] [phone]

## 2018-09-17 NOTE — Consult Note (Signed)
Chief Complaint: Patient was seen in consultation today for percutaneous gastric tube placement at the request of Dr Claudette HeadW Yacoub   Supervising Physician: Gilmer MorWagner, Jaime  [facility] Status: [facility]  History of Present Illness: Micheal Carey is a 68 y.o. male   Resp failure-- accidental overdose Found down/ PEA arrest Anoxic encephalopathy COPD PNA-- treated On trach/vent No response  Dysphagia FTT Protein calorie malnutrition Long term care  Request for percutaneous gastric tube placement Dr Loreta AveWagner has reviewed imaging and approves procedure   Past Medical History:  Diagnosis Date   CAP (community acquired pneumonia) 10/14/2015   Cardiac arrest (HCC) 09/08/2018   ETOH abuse    Opioid abuse (HCC)    Pneumonia    "twice before" (10/14/2015)    Past Surgical History:  Procedure Laterality Date   JOINT REPLACEMENT     TOTAL HIP ARTHROPLASTY Right     Allergies: Patient has no known allergies.  Medications: Prior to Admission medications   Medication Sig Start Date End Date Taking? Authorizing Provider  albuterol (PROVENTIL HFA;VENTOLIN HFA) 108 (90 Base) MCG/ACT inhaler Inhale 2 puffs into the lungs every 6 (six) hours as needed for wheezing or shortness of breath.    [provider]  albuterol (PROVENTIL) (2.5 MG/3ML) 0.083% nebulizer solution Take 2.5 mg by nebulization every 6 (six) hours as needed for wheezing or shortness of breath.    [provider]  amLODipine (NORVASC) 5 MG tablet Take 5 mg by mouth daily. 08/16/17   [provider]  budesonide-formoterol (SYMBICORT) 160-4.5 MCG/ACT inhaler Inhale 2 puffs into the lungs 2 (two) times daily.    [provider]  BYSTOLIC 5 MG tablet Take 5 mg by mouth daily. 04/06/18   [provider]  Cholecalciferol (VITAMIN D PO) Take 1 tablet by mouth daily.    [provider]  cloNIDine (CATAPRES) 0.1 MG tablet Take 1 tablet (0.1 mg total) by mouth every 6  (six) hours for 3 days. 10/27/17 10/30/17  Renne CriglerGeiple, Joshua, PA-C  diclofenac sodium (VOLTAREN) 1 % GEL APPLY 4 GRAMS AA QID PRN FOR PAIN 04/05/18   [provider]  Multiple Vitamins-Minerals (MULTIVITAMIN WITH MINERALS) tablet Take 1 tablet by mouth daily.    [provider]  naproxen (NAPROSYN) 500 MG tablet TK 1 T PO BID WF 12/20/17   [provider]  oxyCODONE-acetaminophen (PERCOCET) 7.5-325 MG tablet Take 1 tablet by mouth See admin instructions. Five times daily as needed for pain 09/10/17   [provider]  potassium chloride SA (K-DUR,KLOR-CON) 20 MEQ tablet Take 20 mEq by mouth 2 (two) times daily.    [provider]  pregabalin (LYRICA) 100 MG capsule TK ONE C PO BID 11/14/17   [provider]  sildenafil (VIAGRA) 100 MG tablet Take 100 mg by mouth daily as needed for erectile dysfunction.    [provider]  tiZANidine (ZANAFLEX) 4 MG tablet TK 1 T PO BID PRN 03/06/18   [provider]     Family History  Problem Relation Age of Onset   Diabetes Mother    Cancer Mother    Diabetes Father    Stroke Neg Hx    CAD Neg Hx     Social History   Socioeconomic History   Marital status: Married    Spouse name: Not on file   Number of children: Not on file   Years of education: Not on file   Highest education level: Not on file  Occupational History  Not on file  Social Needs   Financial resource strain: Not on file   Food insecurity    Worry: Not on file    Inability: Not on file   Transportation needs    Medical: Not on file    Non-medical: Not on file  Tobacco Use   Smoking status: Former Smoker   Smokeless tobacco: Never Used  Substance and Sexual Activity   Alcohol use: Yes    Comment: every day beer 4-5 cans    Drug use: Yes    Types: Oxycodone   Sexual activity: Not Currently  Lifestyle   Physical activity    Days per week: Not on file    Minutes per session: Not on file    Stress: Not on file  Relationships   Social connections    Talks on phone: Not on file    Gets together: Not on file    Attends religious service: Not on file    Active member of club or organization: Not on file    Attends meetings of clubs or organizations: Not on file    Relationship status: Not on file  Other Topics Concern   Not on file  Social History Narrative   Not on file    Review of Systems: A 12 point ROS discussed and pertinent positives are indicated in the HPI above.  All other systems are negative.    Vital Signs: BP (!) 139/112    Pulse 94    Temp 99.3 F (37.4 C) (Axillary)    Resp 19    Ht 5\' 8"  (1.727 m)    Wt 138 lb 7.2 oz (62.8 kg)    SpO2 100%    BMI 21.05 kg/m   Physical Exam Vitals signs reviewed.  Cardiovascular:     Rate and Rhythm: Normal rate.  Pulmonary:     Comments: Trach/vent Abdominal:     Palpations: Abdomen is soft.  Skin:    General: Skin is warm and dry.  Neurological:     Comments: No response  Psychiatric:     Comments: Consented with son Micheal Carey via phone     Imaging: Ct Abdomen Wo Contrast  Result Date: 09/17/2018 CLINICAL DATA:  Dysphagia, preop planning for gastrostomy placement EXAM: CT ABDOMEN WITHOUT CONTRAST TECHNIQUE: Multidetector CT imaging of the abdomen was performed following the standard protocol without IV contrast. COMPARISON:  04/12/2017 FINDINGS: Lower chest: patchy opacities in the dependent aspect of both lung bases may represent subsegmental atelectasis or infiltrates . No pleural or pericardial effusion. Hepatobiliary: No focal liver abnormality is seen. No gallstones, gallbladder wall thickening, or biliary dilatation. Pancreas: Unremarkable. No pancreatic ductal dilatation or surrounding inflammatory changes. Spleen: Normal in size without focal abnormality. Adrenals/Urinary Tract: Adrenal glands are unremarkable. Kidneys are normal, without renal calculi, focal lesion, or hydronephrosis. Stomach/Bowel: The  stomach is nondistended. The body and antrum are posterior to the left lobe of the liver but feeding tube extends to the duodenal bulb. Visualized portions of small bowel and colon unremarkable. Vascular/Lymphatic: No retroperitoneal or mesenteric adenopathy evident. Other: No ascites. No free air. Musculoskeletal: No acute or significant osseous findings. Small umbilical hernia containing only mesenteric fat. IMPRESSION: 1. The decompressed stomach is posterior to the left lobe of the liver. Presumably appropriate gastric distension would distend the stomach infrahepatic and allow safe percutaneous access for gastrostomy as there is a significant window between the inferior margin of the liver and transverse colon. 2. No acute findings. 3. Feeding tube terminating in  the duodenal bulb. 4. Small umbilical hernia containing only mesenteric fat. 5. Probable subsegmental atelectasis in both lung bases. Electronically Signed   By: Lucrezia Europe M.D.   On: 09/17/2018 07:47   Dg Abd 1 View  Result Date: 09/10/2018 CLINICAL DATA:  Encounter for OG tube placement EXAM: ABDOMEN - 1 VIEW COMPARISON:  September 10, 2018. FINDINGS: The tip of the OG tube is seen coiled over the stomach in the left upper quadrant. IMPRESSION: OG tube coiled within the stomach. Electronically Signed   By: Prudencio Pair M.D.   On: 09/10/2018 18:18   Ct Head Wo Contrast  Result Date: 09/08/2018 CLINICAL DATA:  Head trauma EXAM: CT HEAD WITHOUT CONTRAST CT CERVICAL SPINE WITHOUT CONTRAST TECHNIQUE: Multidetector CT imaging of the head and cervical spine was performed following the standard protocol without intravenous contrast. Multiplanar CT image reconstructions of the cervical spine were also generated. COMPARISON:  10/27/2017. FINDINGS: CT HEAD FINDINGS Brain: No evidence of acute infarction, hemorrhage, hydrocephalus, extra-axial collection or mass lesion/mass effect. Vascular: No hyperdense vessel or unexpected calcification. Skull: Normal.  Negative for fracture or focal lesion. Sinuses/Orbits: No acute finding. Other: None. CT CERVICAL SPINE FINDINGS Alignment: Normal. Skull base and vertebrae: There is no displaced fracture. No dislocation. There is a lucent lesion in the C6 vertebral body with a a relatively narrow zone of transition. This is favored to represent a benign process. Soft tissues and spinal canal: No prevertebral fluid or swelling. No visible canal hematoma. Disc levels:  Normal Upper chest: Negative. Other: None IMPRESSION: 1. No acute intracranial abnormality. 2. No acute cervical spine fracture. Electronically Signed   By: Constance Holster M.D.   On: 09/08/2018 20:32   Ct Cervical Spine Wo Contrast  Result Date: 09/08/2018 CLINICAL DATA:  Head trauma EXAM: CT HEAD WITHOUT CONTRAST CT CERVICAL SPINE WITHOUT CONTRAST TECHNIQUE: Multidetector CT imaging of the head and cervical spine was performed following the standard protocol without intravenous contrast. Multiplanar CT image reconstructions of the cervical spine were also generated. COMPARISON:  10/27/2017. FINDINGS: CT HEAD FINDINGS Brain: No evidence of acute infarction, hemorrhage, hydrocephalus, extra-axial collection or mass lesion/mass effect. Vascular: No hyperdense vessel or unexpected calcification. Skull: Normal. Negative for fracture or focal lesion. Sinuses/Orbits: No acute finding. Other: None. CT CERVICAL SPINE FINDINGS Alignment: Normal. Skull base and vertebrae: There is no displaced fracture. No dislocation. There is a lucent lesion in the C6 vertebral body with a a relatively narrow zone of transition. This is favored to represent a benign process. Soft tissues and spinal canal: No prevertebral fluid or swelling. No visible canal hematoma. Disc levels:  Normal Upper chest: Negative. Other: None IMPRESSION: 1. No acute intracranial abnormality. 2. No acute cervical spine fracture. Electronically Signed   By: Constance Holster M.D.   On: 09/08/2018 20:32    Mr Brain Wo Contrast  Result Date: 09/11/2018 CLINICAL DATA:  Anoxic brain damage. Additional history: Patient found down August 9th in parking lot in PEA arrest. Unknown down time. EXAM: MRI HEAD WITHOUT CONTRAST TECHNIQUE: Multiplanar, multiecho pulse sequences of the brain and surrounding structures were obtained without intravenous contrast. COMPARISON:  Head CT 09/08/2018 FINDINGS: Brain: There is diffuse cortical diffusion-weighted signal hyperintensity bilaterally. There is also symmetric diffusion-weighted signal abnormality within the bilateral thalami, caudate nuclei, medial temporal lobes/hippocampi and bilateral cerebellum. Corresponding patchy T2/FLAIR hyperintensity at the sites. There is also appreciable swelling of the bilateral thalami. Additional moderate scattered T2/FLAIR hyperintensity within the cerebral white matter is nonspecific but consistent with chronic  small vessel ischemic disease. No evidence of intracranial mass or intracranial hemorrhage. No midline shift or extra-axial collection. Mild generalized parenchymal atrophy. Vascular: Flow voids maintained within the proximal large vessels. Skull and upper cervical spine: Normal marrow signal. Sinuses/Orbits: The imaged globes and orbits demonstrate no acute abnormality. Mild scattered paranasal sinus mucosal thickening. Secretions within the nasopharynx. Small right mastoid effusion. These results were called by telephone at the time of interpretation on 09/11/2018 at 1:45 pm to Dr. Milon Dikes , who verbally acknowledged these results. IMPRESSION: Supratentorial and infratentorial diffusion-weighted and T2/FLAIR signal abnormality, as described, and consistent with hypoxic/ischemic injury. Moderate chronic small vessel ischemic disease. Electronically Signed   By: Jackey Loge   On: 09/11/2018 13:52   Dg Chest Port 1 View  Result Date: 09/16/2018 CLINICAL DATA:  Tracheostomy EXAM: PORTABLE CHEST 1 VIEW COMPARISON:  09/13/2018  FINDINGS: Interval removal of endotracheal and enteric tubes. Interval placement of tracheostomy tube with distal tip positioned in the midthoracic trachea. Cardiomediastinal silhouette is stable. Lungs are clear. No pleural effusion or pneumothorax. IMPRESSION: Satisfactory positioning of tracheostomy tube.  No acute findings. Electronically Signed   By: Duanne Guess M.D.   On: 09/16/2018 13:52   Dg Chest Port 1 View  Result Date: 09/13/2018 CLINICAL DATA:  Hypoxia EXAM: PORTABLE CHEST 1 VIEW COMPARISON:  09/12/2018 FINDINGS: Cardiac shadow is stable. Endotracheal tube and gastric catheter are noted in satisfactory position. The lungs are clear. No bony abnormality is noted. IMPRESSION: Tubes and lines as described above. No acute abnormality noted. Electronically Signed   By: Alcide Clever M.D.   On: 09/13/2018 07:08   Dg Chest Port 1 View  Result Date: 09/12/2018 CLINICAL DATA:  Hypoxia EXAM: PORTABLE CHEST 1 VIEW COMPARISON:  September 11, 2018 FINDINGS: Endotracheal tube tip is 4.9 cm above the carina. Nasogastric tube tip and side port are in the stomach. No evident pneumothorax. There is no edema or consolidation. Heart size and pulmonary vascularity are normal. No adenopathy. No bone lesions. IMPRESSION: Tube positions as described without pneumothorax. No edema or consolidation. Cardiac silhouette within normal limits. Electronically Signed   By: Bretta Bang III M.D.   On: 09/12/2018 07:17   Dg Chest Port 1 View  Result Date: 09/11/2018 CLINICAL DATA:  Check endotracheal tube placement EXAM: PORTABLE CHEST 1 VIEW COMPARISON:  09/10/2018 FINDINGS: Cardiac shadow is stable. Endotracheal tube and gastric catheter are noted in satisfactory position. The lungs are well aerated bilaterally. No focal infiltrate or effusion is seen. No bony abnormality is noted. IMPRESSION: No acute abnormality noted. Tubes and lines in satisfactory position. Electronically Signed   By: Alcide Clever M.D.   On:  09/11/2018 08:11   Dg Chest Port 1 View  Result Date: 09/10/2018 CLINICAL DATA:  Endotracheal tube. EXAM: PORTABLE CHEST 1 VIEW COMPARISON:  Chest x-rays dated 09/10/2018 and 09/09/2018. FINDINGS: Endotracheal tube appears well positioned with tip just above the level of the carina. Enteric tube has retracted to the level of the mid esophagus. Lungs are clear. No pleural effusion or pneumothorax seen. IMPRESSION: 1. Enteric tube has retracted to the level of the mid esophagus. Recommend re-advancing to the stomach. 2. Endotracheal tube remains well positioned with tip just above the level of the carina. 3. Lungs are clear. Electronically Signed   By: Bary Richard M.D.   On: 09/10/2018 13:36   Dg Chest Port 1 View  Result Date: 09/10/2018 CLINICAL DATA:  Intubation. EXAM: PORTABLE CHEST 1 VIEW COMPARISON:  09/09/2018. FINDINGS: Endotracheal to  in stable position. NG tube is been slightly withdrawn, its side port is at the gastroesophageal junction. Advancement of approximately 10 cm suggested. Heart size stable. Lungs are clear. No pleural effusion or pneumothorax. IMPRESSION: 1. Endotracheal tube in stable position. NG tube has been slightly withdrawn, its side port is at the gastroesophageal junction level. Advancement of approximately 10 cm suggested. 2.  No acute cardiopulmonary disease. Electronically Signed   By: Maisie Fushomas  Register   On: 09/10/2018 07:59   Dg Chest Port 1 View  Result Date: 09/09/2018 CLINICAL DATA:  Endotracheal tube and central line placement. EXAM: PORTABLE CHEST 1 VIEW COMPARISON:  Radiographs of September 08, 2018. FINDINGS: The heart size and mediastinal contours are within normal limits. Endotracheal and nasogastric tubes are in grossly good position. Both lungs are clear. The visualized skeletal structures are unremarkable. IMPRESSION: Stable support apparatus. No acute cardiopulmonary abnormality seen. Electronically Signed   By: Lupita RaiderJames  Green Jr M.D.   On: 09/09/2018 07:50    Dg Chest Port 1 View  Result Date: 09/08/2018 CLINICAL DATA:  Endotracheal tube placement. EXAM: PORTABLE CHEST 1 VIEW COMPARISON:  September 08, 2018 FINDINGS: Endotracheal tube terminates 4.6 cm above the carina. Enteric catheter transverses the thorax. Transcutaneous defibrillator pads overlie the thorax. Cardiomediastinal silhouette is normal. Mediastinal contours appear intact. There is no evidence of focal airspace consolidation, pleural effusion or pneumothorax. Scarring versus atelectasis in the right upper lung field. Osseous structures are without acute abnormality. Soft tissues are grossly normal. IMPRESSION: Endotracheal tube terminates 4.6 cm above the carina. No acute findings. Electronically Signed   By: Ted Mcalpineobrinka  Dimitrova M.D.   On: 09/08/2018 19:39   Dg Chest Port 1 View  Result Date: 09/08/2018 CLINICAL DATA:  68 year old male status post intubation. EXAM: PORTABLE CHEST 1 VIEW COMPARISON:  Chest x-ray 10/27/2017. FINDINGS: An endotracheal tube is in place with tip 4.8 cm above the carina. A nasogastric tube is seen extending into the stomach, however, the tip of the nasogastric tube extends below the lower margin of the image. Transcutaneous defibrillator pads project over the lower left hemithorax. Mild scarring in the right mid lung. Lung volumes are low. No consolidative airspace disease. No pleural effusions. No pneumothorax. No pulmonary nodule or mass noted. Pulmonary vasculature and the cardiomediastinal silhouette are within normal limits. IMPRESSION: 1. Support apparatus, as above. 2. Low lung volumes without radiographic evidence of acute cardiopulmonary disease. Electronically Signed   By: Trudie Reedaniel  Entrikin M.D.   On: 09/08/2018 17:02    Labs:  CBC: Recent Labs    09/10/18 0402 09/12/18 0047 09/13/18 0328 09/14/18 0357  WBC 12.1* 14.0* 9.4 8.5  HGB 13.2 12.9* 13.0 13.1  HCT 38.7* 38.0* 39.4 40.2  PLT 223 216 179 217    COAGS: Recent Labs    09/08/18 2037  09/12/18 0047 09/16/18 1237  INR 1.4* 1.2 1.2  APTT 28  --  40*    BMP: Recent Labs    09/15/18 0420 09/15/18 1207 09/16/18 0602 09/17/18 0440  NA 153* 155* 154* 150*  K 2.9* 3.6 3.6 3.0*  CL 122* 123* 124* 121*  CO2 21* 20* 22 23  GLUCOSE 120* 123* 120* 120*  BUN 18 18 15 14   CALCIUM 9.0 8.9 9.0 8.7*  CREATININE 0.88 0.87 0.80 0.90  GFRNONAA >60 >60 >60 >60  GFRAA >60 >60 >60 >60    LIVER FUNCTION TESTS: Recent Labs    10/27/17 1348 09/08/18 1619 09/08/18 2037 09/12/18 0047  BILITOT 2.0* 0.4 1.0 2.1*  AST 50*  86* 97* 48*  ALT 28 33 32 22  ALKPHOS 91 89 70 64  PROT 8.2* 7.0 6.4* 7.1  ALBUMIN 4.7 3.7 3.5 3.1*    TUMOR MARKERS: No results for input(s): AFPTM, CEA, CA199, CHROMGRNA in the last 8760 hours.  Assessment and Plan:  Anoxic encephalopathy Dysphagia FTT; protein calorie malnutrition Need for long term care Scheduled for percutaneous gastric tube placement Risks and benefits discussed with the [name] via phone including, but not limited to the need for a barium enema during the procedure, bleeding, infection, peritonitis, or damage to adjacent structures.  All questions were answered, he is agreeable to proceed. Consent signed and in chart  Thank you for this interesting consult.  I greatly enjoyed meeting Micheal LampHarvey Carey and look forward to participating in their care.  A copy of this report was sent to the requesting provider on this date.  Electronically Signed: Robet LeuPamela A Joanie Duprey, PA-C 09/17/2018, 8:39 AM   I spent a total of 40 Minutes    in face to face in clinical consultation, greater than 50% of which was counseling/coordinating care for percutaneous gastric tube placement

## 2018-09-18 ENCOUNTER — Encounter (HOSPITAL_COMMUNITY): Payer: Self-pay | Admitting: Interventional Radiology

## 2018-09-18 ENCOUNTER — Inpatient Hospital Stay (HOSPITAL_COMMUNITY): Payer: Medicare Other

## 2018-09-18 DIAGNOSIS — I469 Cardiac arrest, cause unspecified: Secondary | ICD-10-CM | POA: Diagnosis present

## 2018-09-18 HISTORY — PX: IR GASTROSTOMY TUBE MOD SED: IMG625

## 2018-09-18 LAB — BASIC METABOLIC PANEL
Anion gap: 8 (ref 5–15)
BUN: 12 mg/dL (ref 8–23)
CO2: 22 mmol/L (ref 22–32)
Calcium: 8.5 mg/dL — ABNORMAL LOW (ref 8.9–10.3)
Chloride: 116 mmol/L — ABNORMAL HIGH (ref 98–111)
Creatinine, Ser: 0.88 mg/dL (ref 0.61–1.24)
GFR calc Af Amer: >60 mL/min (ref >60)
GFR calc non Af Amer: >60 mL/min (ref >60)
Glucose, Bld: 113 mg/dL — ABNORMAL HIGH (ref 70–99)
Potassium: 3.5 mmol/L (ref 3.5–5.1)
Sodium: 146 mmol/L — ABNORMAL HIGH (ref 135–145)

## 2018-09-18 LAB — GLUCOSE, CAPILLARY
Glucose-Capillary: 100 mg/dL — ABNORMAL HIGH (ref 70–99)
Glucose-Capillary: 108 mg/dL — ABNORMAL HIGH (ref 70–99)
Glucose-Capillary: 110 mg/dL — ABNORMAL HIGH (ref 70–99)
Glucose-Capillary: 117 mg/dL — ABNORMAL HIGH (ref 70–99)
Glucose-Capillary: 119 mg/dL — ABNORMAL HIGH (ref 70–99)
Glucose-Capillary: 124 mg/dL — ABNORMAL HIGH (ref 70–99)
Glucose-Capillary: 133 mg/dL — ABNORMAL HIGH (ref 70–99)

## 2018-09-18 MED ORDER — LIDOCAINE HCL 1 % IJ SOLN
INTRAMUSCULAR | Status: AC
  Filled 2018-09-18: qty 20

## 2018-09-18 MED ORDER — FENTANYL CITRATE (PF) 100 MCG/2ML IJ SOLN
INTRAMUSCULAR | Status: AC | PRN
  Administered 2018-09-18: 25 ug via INTRAVENOUS

## 2018-09-18 MED ORDER — CEFAZOLIN SODIUM-DEXTROSE 2-4 GM/100ML-% IV SOLN
2.0000 g | INTRAVENOUS | Status: AC
  Administered 2018-09-18: 2 g via INTRAVENOUS
  Filled 2018-09-18: qty 100

## 2018-09-18 MED ORDER — POTASSIUM CHLORIDE 10 MEQ/100ML IV SOLN
10.0000 meq | INTRAVENOUS | Status: AC
  Administered 2018-09-18 (×2): 10 meq via INTRAVENOUS
  Filled 2018-09-18 (×2): qty 100

## 2018-09-18 MED ORDER — IOHEXOL 300 MG/ML  SOLN
50.0000 mL | Freq: Once | INTRAMUSCULAR | Status: AC | PRN
  Administered 2018-09-18: 10 mL

## 2018-09-18 MED ORDER — ENOXAPARIN SODIUM 40 MG/0.4ML ~~LOC~~ SOLN
40.0000 mg | SUBCUTANEOUS | Status: DC
  Administered 2018-09-18 – 2018-11-08 (×52): 40 mg via SUBCUTANEOUS
  Filled 2018-09-18 (×52): qty 0.4

## 2018-09-18 MED ORDER — VITAL AF 1.2 CAL PO LIQD
1000.0000 mL | ORAL | Status: DC
  Administered 2018-09-19: 08:00:00 1000 mL

## 2018-09-18 MED ORDER — ACETAMINOPHEN 10 MG/ML IV SOLN
1000.0000 mg | Freq: Four times a day (QID) | INTRAVENOUS | Status: DC | PRN
  Administered 2018-09-18: 1000 mg via INTRAVENOUS
  Filled 2018-09-18: qty 100

## 2018-09-18 MED ORDER — ONDANSETRON HCL 4 MG PO TABS: 4.0000 mg | ORAL_TABLET | Freq: Four times a day (QID) | ORAL | Status: DC | PRN

## 2018-09-18 MED ORDER — OXYCODONE HCL 5 MG PO TABS: 5.0000 mg | ORAL_TABLET | Freq: Four times a day (QID) | ORAL | Status: DC

## 2018-09-18 MED ORDER — MIDAZOLAM HCL 2 MG/2ML IJ SOLN
INTRAMUSCULAR | Status: AC
  Filled 2018-09-18: qty 2

## 2018-09-18 MED ORDER — LIDOCAINE HCL 1 % IJ SOLN
INTRAMUSCULAR | Status: AC | PRN
  Administered 2018-09-18: 5 mL

## 2018-09-18 MED ORDER — ONDANSETRON HCL 4 MG/2ML IJ SOLN
4.0000 mg | Freq: Four times a day (QID) | INTRAMUSCULAR | Status: DC | PRN
  Administered 2018-09-21 – 2018-09-23 (×4): 4 mg via INTRAVENOUS
  Filled 2018-09-18 (×4): qty 2

## 2018-09-18 MED ORDER — ACETAMINOPHEN 500 MG PO TABS
500.0000 mg | ORAL_TABLET | Freq: Four times a day (QID) | ORAL | Status: DC
  Administered 2018-09-18 – 2018-09-26 (×28): 500 mg
  Filled 2018-09-18 (×28): qty 1

## 2018-09-18 MED ORDER — MIDAZOLAM HCL 2 MG/2ML IJ SOLN
INTRAMUSCULAR | Status: AC | PRN
  Administered 2018-09-18: 1 mg via INTRAVENOUS

## 2018-09-18 MED ORDER — GLUCAGON HCL RDNA (DIAGNOSTIC) 1 MG IJ SOLR
INTRAMUSCULAR | Status: AC
  Filled 2018-09-18: qty 1

## 2018-09-18 MED ORDER — GLUCAGON HCL RDNA (DIAGNOSTIC) 1 MG IJ SOLR
INTRAMUSCULAR | Status: AC | PRN
  Administered 2018-09-18: 1 mg via INTRAVENOUS

## 2018-09-18 MED ORDER — CEFAZOLIN SODIUM-DEXTROSE 2-4 GM/100ML-% IV SOLN
INTRAVENOUS | Status: AC
  Filled 2018-09-18: qty 100

## 2018-09-18 MED ORDER — OXYCODONE HCL 5 MG PO TABS
5.0000 mg | ORAL_TABLET | Freq: Four times a day (QID) | ORAL | Status: DC
  Administered 2018-09-19: 5 mg
  Filled 2018-09-18: qty 1

## 2018-09-18 MED ORDER — QUETIAPINE FUMARATE 50 MG PO TABS: 25.0000 mg | ORAL_TABLET | Freq: Two times a day (BID) | ORAL | Status: DC

## 2018-09-18 MED ORDER — POTASSIUM CHLORIDE 20 MEQ/15ML (10%) PO SOLN: 20.0000 meq | ORAL | Status: DC

## 2018-09-18 NOTE — Evaluation (Signed)
Physical Therapy Evaluation Patient Details Name: Micheal Carey MRN: [id number] DOB: [birth year] Today's Date: 09/18/2018   History of Present Illness  Pt is a 68 year old man admitted 09/08/18 after found down with PEA arrest, unknown down time. Pt intubated. +severe hypoxic encephalopathy, ETOH and opiates.  Clinical Impression  Pt admitted with above. Pt with trach/vent, no command follow. Pt rests head in R sidebending and rotation to the R. Pt grimacing with all movement. Pt with noted bilat extensor tone/posturing upon return to supine. Pt was total assist for transfer to EOB and did tolerated EOB x 12 min. Pt with no tracking of eyes but did voluntarily move head but not to command. Pt with strong preference to L leaning. Focused on trunk rotation and acknowledging R side with increased tactile cues. Acute PT to cont to follow.    Follow Up Recommendations LTACH    Equipment Recommendations  (TBD at next venue)    Recommendations for Other Services       Precautions / Restrictions Precautions Precautions: Fall;Other (comment) Precaution Comments: trach/ventilator Restrictions Weight Bearing Restrictions: No      Mobility  Bed Mobility Overal bed mobility: Needs Assistance Bed Mobility: Rolling;Sidelying to Sit;Sit to Supine Rolling: Total assist;+2 for physical assistance Sidelying to sit: Total assist;+2 for physical assistance   Sit to supine: Total assist;+2 for physical assistance(3rd for line management)   General bed mobility comments: pt with no initiation of task, pt complete dependent for mobiltiy  Transfers                 General transfer comment: unable this date  Ambulation/Gait             General Gait Details: unable  Stairs            Wheelchair Mobility    Modified Rankin (Stroke Patients Only)       Balance Overall balance assessment: Needs assistance Sitting-balance support: Feet supported;Bilateral upper extremity  supported Sitting balance-Leahy Scale: Zero Sitting balance - Comments: pt requiring maxA to maintain EOB balance, towards the end pt progressed to min/modA. pt with strong L lateral lean with no protective response. worked on trunk mobility and control. pt sat EOB x 12 min                                      Pertinent Vitals/Pain Pain Assessment: Faces Faces Pain Scale: Hurts even more Pain Location: throughout body, grimacing with ROM, passive turning of head Pain Descriptors / Indicators: Grimacing Pain Intervention(s): Monitored during session    Home Living Family/patient expects to be discharged to:: Skilled nursing facility Living Arrangements: Alone               Additional Comments: family having him flown to [place]    Prior Function Level of Independence: Independent         Comments: pt was found down in a parking lot     Hand Dominance        Extremity/Trunk Assessment   Upper Extremity Assessment Upper Extremity Assessment: (no active movement in bilat UEs)    Lower Extremity Assessment Lower Extremity Assessment: Generalized weakness(able to achieve full passive ROM, witness extensor posturing)    Cervical / Trunk Assessment Cervical / Trunk Assessment: (poor control)  Communication   Communication: Tracheostomy  Cognition Arousal/Alertness: Awake/alert Behavior During Therapy: Flat affect Overall Cognitive Status: Impaired/Different from baseline Area of  Impairment: Attention;Following commands;Safety/judgement;Awareness;Problem solving                   Current Attention Level: Focused   Following Commands: Follows one step commands inconsistently(pt with no command follow) Safety/Judgement: Decreased awareness of safety;Decreased awareness of deficits Awareness: Intellectual Problem Solving: Requires verbal cues;Requires tactile cues;Difficulty sequencing;Decreased initiation(pt with no processing) General  Comments: pt with eyes open, no tracking of eyes, pt with head resting in R side bending and rotation, passively will rotate to L however with grimace and returns to R rotation      General Comments General comments (skin integrity, edema, etc.): VSS, pt with productive cough, RN present to suction    Exercises     Assessment/Plan    PT Assessment Patient needs continued PT services  PT Problem List Decreased strength;Decreased range of motion;Decreased activity tolerance;Decreased balance;Decreased mobility;Decreased coordination;Decreased cognition;Decreased knowledge of use of DME;Decreased safety awareness;Decreased knowledge of precautions       PT Treatment Interventions DME instruction;Functional mobility training;Therapeutic activities;Therapeutic exercise;Balance training;Neuromuscular re-education;Cognitive remediation    PT Goals (Current goals can be found in the Care Plan section)  Acute Rehab PT Goals Patient Stated Goal: unable to state PT Goal Formulation: Patient unable to participate in goal setting Time For Goal Achievement: 10/02/18 Potential to Achieve Goals: Fair    Frequency Min 2X/week   Barriers to discharge Decreased caregiver support lives alone, per RN and OT family having him moved to [place]    Co-evaluation               AM-PAC PT "6 Clicks" Mobility  Outcome Measure Help needed turning from your back to your side while in a flat bed without using bedrails?: Total Help needed moving from lying on your back to sitting on the side of a flat bed without using bedrails?: Total Help needed moving to and from a bed to a chair (including a wheelchair)?: Total Help needed standing up from a chair using your arms (e.g., wheelchair or bedside chair)?: Total Help needed to walk in hospital room?: Total Help needed climbing 3-5 steps with a railing? : Total 6 Click Score: 6    End of Session Equipment Utilized During Treatment: Gait belt Activity  Tolerance: Patient tolerated treatment well Patient left: in bed;with call bell/phone within reach;with restraints reapplied;with nursing/sitter in room Nurse Communication: Mobility status(RN present for treatment) PT Visit Diagnosis: Unsteadiness on feet (R26.81);Muscle weakness (generalized) (M62.81);Difficulty in walking, not elsewhere classified (R26.2)    Time: 1610-96040901-0927 PT Time Calculation (min) (ACUTE ONLY): 26 min   Charges:   PT Evaluation $PT Eval Moderate Complexity: 1 Mod          Lewis ShockAshly Dewitt Judice, PT, DPT Acute Rehabilitation Services Pager #: [phone] Office #: [phone]   Iona Hansenshly M Camika Marsico 09/18/2018, 9:55 AM

## 2018-09-18 NOTE — Progress Notes (Signed)
CSW spoke with Lanelle Bal at the [facility] to obtain information on this patient's benefits and support through the [place]. Erline Levine reports that this patient has an assigned PCP and Education officer, museum through the [place]. Erline Levine provided CSW with the social worker's contact information which is Horald Chestnut [phone]. [phone].  CSW attempted to reach [facility] but was unsuccessful so a voicemail was left requesting a return call.  Madilyn Fireman, MSW, LCSW-A Clinical Social Worker [facility] [phone]

## 2018-09-18 NOTE — Procedures (Signed)
Interventional Radiology Procedure Note  Procedure: Placement of percutaneous 20F pull-through gastrostomy tube. Complications: None Recommendations: - NPO except for sips and chips remainder of today and overnight - Maintain G-tube to LWS until tomorrow morning  - May advance diet as tolerated and begin using tube tomorrow morning  Signed,  Heath K. McCullough, MD   

## 2018-09-18 NOTE — Progress Notes (Signed)
Orthopedic Tech Progress Note Patient Details:  Micheal Carey Oct 16, 1950 [id number]  Ortho Devices Type of Ortho Device: Louretta Parma boot Ortho Device/Splint Location: Bilateral unna boots Ortho Device/Splint Interventions: Application   Post Interventions Patient Tolerated: Well Instructions Provided: Care of device   [name] 09/18/2018, 3:50 PM

## 2018-09-18 NOTE — Progress Notes (Addendum)
Nutrition Follow-up RD working remotely.  DOCUMENTATION CODES:   Non-severe (moderate) malnutrition in context of social or environmental circumstances  INTERVENTION:    Resume TF after PEG placed and ready to use. Change TF to Vital AF 1.2 at 60 ml/h.   D/C Pro-stat.   Provides 1728 kcal, 108 gm protein, 1168 ml free water daily.  NUTRITION DIAGNOSIS:   Moderate Malnutrition related to social / environmental circumstances(Hx substance abuse) as evidenced by moderate muscle depletion, moderate fat depletion.  Ongoing  GOAL:   Patient will meet greater than or equal to 90% of their needs  Met with TF  MONITOR:   Vent status, TF tolerance, Labs  ASSESSMENT:   68 yo male admitted with PEA arrest after being found down in a parking lot. Urine screen positive for opiates, ETOH positive. PMH includes alcohol and opiate abuse, CAP.  S/P tracheostomy 8/17. Cortrak placed 8/17. Tip in the stomach. Scheduled for PEG today.  Remains on ventilator support. MV: 6.4 L/min Temp (24hrs), Avg:99.8 F (37.7 C), Min:99.4 F (37.4 C), Max:100.3 F (37.9 C)  Propofol: off  Labs reviewed. Sodium 146 (H) CBG's: [phone]  Medications reviewed and include folic acid, novolog, thiamine. IVF: D5 1/2 NS with KCl at 75 ml/h  Patient is currently receiving Vital High Protein at 30 ml/h (720 ml/day) with Prostat 30 ml TID to provide 1020 kcals, 108 gm protein, 602 ml free water daily. Free water flushes 200 ml every 6 hours.  Weight down 3.9 kg since admission I/O Net +4.1 L No edema documented today.  Diet Order:   Diet Order            Diet NPO time specified  Diet effective midnight              EDUCATION NEEDS:   No education needs have been identified at this time  Skin:  Skin Assessment: Reviewed RN Assessment  Last BM:  8/19 type 7  Height:   Ht Readings from Last 1 Encounters:  09/08/18 '5\' 8"'  (1.727 m)    Weight:   Wt Readings from Last 1  Encounters:  09/18/18 64.1 kg    Ideal Body Weight:  70 kg  BMI:  Body mass index is 21.49 kg/m.  Estimated Nutritional Needs:   Kcal:  4259  Protein:  100-115 gm  Fluid:  >/= 1.7 L    Molli Barrows, RD, LDN, [place] Pager [phone] After Hours Pager [phone]

## 2018-09-18 NOTE — Progress Notes (Signed)
[facility] [address] ADULT ICU REPLACEMENT PROTOCOL FOR AM LAB REPLACEMENT ONLY  The patient does apply for the [facility] Adult ICU Electrolyte Replacment Protocol based on the criteria listed below:   1. Is GFR >/= 40 ml/min? Yes.    Patient's GFR today is >60 2. Is urine output >/= 0.5 ml/kg/hr for the last 6 hours? Yes.   Patient's UOP is 1.3 ml/kg/hr 3. Is BUN < 60 mg/dL? Yes.    Patient's BUN today is 12 4. Abnormal electrolyte K-3.5 5. Ordered repletion with: per protocol 6. If a panic level lab has been reported, has the CCM MD in charge been notified? Yes.  .   Physician:  Dr. Terrill Mohr, Philis Nettle 09/18/2018 6:14 AM

## 2018-09-18 NOTE — Progress Notes (Signed)
Pt transferred to and from IR on ventilator without incident.

## 2018-09-18 NOTE — Progress Notes (Signed)
Occupational Therapy Treatment Patient Details Name: Micheal Carey MRN: [id number] DOB: [birth year] Today's Date: 09/18/2018    History of present illness Pt is a 68 year old man admitted 09/08/18 after found down with PEA arrest, unknown down time. Pt intubated. +severe hypoxic encephalopathy, ETOH and opiates.   OT comments  Pt with stable VS on vent for bed mobility and assisted to sit at EOB, RN managing multiple lines. Pt continues to grimace to pain stimuli, but does not respond to visual threat or appear to focus or track visually. Pt has received B resting hand splints, checked for fit and skin integrity with no issues. Posted sign over bed for night time use of splints with department phone number if concerns arise.   Follow Up Recommendations  SNF;LTACH;Supervision/Assistance - 24 hour(family plans to transport pt to [place])    Equipment Recommendations       Recommendations for Other Services      Precautions / Restrictions Precautions Precautions: Fall;Other (comment) Precaution Comments: trach/ventilator, rectal pouch Required Braces or Orthoses: Other Brace Other Brace: B resting hand splints checked for fit and skin integrity, placed sign over bed for night wear Restrictions Weight Bearing Restrictions: No       Mobility Bed Mobility Overal bed mobility: Needs Assistance Bed Mobility: Rolling;Sidelying to Sit;Sit to Supine Rolling: Total assist;+2 for physical assistance Sidelying to sit: Total assist;+2 for physical assistance   Sit to supine: Total assist;+2 for physical assistance(3rd for line management)   General bed mobility comments: pt with no initiation of task, pt complete dependent for mobiltiy  Transfers                 General transfer comment: unable this date    Balance Overall balance assessment: Needs assistance Sitting-balance support: Feet supported;Bilateral upper extremity supported Sitting balance-Leahy Scale: Zero Sitting balance -  Comments: pt requiring maxA to maintain EOB balance, towards the end pt progressed to min/modA. pt with strong L lateral lean with no protective response. worked on trunk mobility and control. pt sat EOB x 12 min                                    ADL either performed or assessed with clinical judgement   ADL                                         General ADL Comments: requires total care     Vision       Perception     Praxis      Cognition Arousal/Alertness: Awake/alert Behavior During Therapy: Flat affect Overall Cognitive Status: Impaired/Different from baseline Area of Impairment: Attention;Following commands;Safety/judgement;Awareness;Problem solving                   Current Attention Level: Focused   Following Commands: (pt not following commands) Safety/Judgement: Decreased awareness of safety;Decreased awareness of deficits Awareness: Intellectual Problem Solving: Requires verbal cues;Requires tactile cues;Difficulty sequencing;Decreased initiation(pt with no processing) General Comments: pt with eyes open, no tracking of eyes, pt with head resting in R side bending and rotation, passively will rotate to L however with grimace and returns to R rotation        Exercises     Shoulder Instructions       General Comments VSS, pt with productive cough,  RN present to suction    Pertinent Vitals/ Pain       Pain Assessment: Faces Faces Pain Scale: Hurts even more Pain Location: throughout body, grimacing with ROM, passive turning of head Pain Descriptors / Indicators: Grimacing Pain Intervention(s): Monitored during session  Home Living Family/patient expects to be discharged to:: Skilled nursing facility Living Arrangements: Alone                               Additional Comments: family having him flown to [place]      Prior Functioning/Environment Level of Independence: Independent         Comments: pt was found down in a parking lot   Frequency  Min 2X/week        Progress Toward Goals  OT Goals(current goals can now be found in the care plan section)     Acute Rehab OT Goals Patient Stated Goal: unable to state OT Goal Formulation: Patient unable to participate in goal setting Time For Goal Achievement: 09/30/18 Potential to Achieve Goals: Good  Plan Discharge plan remains appropriate    Co-evaluation    PT/OT/SLP Co-Evaluation/Treatment: Yes Reason for Co-Treatment: Complexity of the patient's impairments (multi-system involvement);For patient/therapist safety   OT goals addressed during session: Strengthening/ROM      AM-PAC OT "6 Clicks" Daily Activity     Outcome Measure   Help from another person eating meals?: Total Help from another person taking care of personal grooming?: Total Help from another person toileting, which includes using toliet, bedpan, or urinal?: Total Help from another person bathing (including washing, rinsing, drying)?: Total Help from another person to put on and taking off regular upper body clothing?: Total Help from another person to put on and taking off regular lower body clothing?: Total 6 Click Score: 6    End of Session    OT Visit Diagnosis: Muscle weakness (generalized) (M62.81)   Activity Tolerance Patient tolerated treatment well   Patient Left in bed;with call bell/phone within reach;with bed alarm set   Nurse Communication Mobility status;Other (comment)(RN assisted with management of multiple lines)        Time: [id number] OT Time Calculation (min): 28 min  Charges: OT General Charges $OT Visit: 1 Visit OT Treatments $Therapeutic Activity: 8-22 mins  Martie RoundJulie Simone Tuckey, OTR/L Acute Rehabilitation Services Pager: [phone] Office: [phone]   Micheal Carey, Micheal Carey 09/18/2018, 10:08 AM

## 2018-09-18 NOTE — Progress Notes (Signed)
Spoke to Dr. Tamala Julian and informed him that cortrak was removed during surgery and IR wrote orders for PEG use 8/20 at 0800. Order received to hold medications and flushes until PEG is available for use.   Myrle Sheng, BSN, RN 5M/59M MICU

## 2018-09-18 NOTE — Progress Notes (Signed)
[facility] Progress Note Patient Name: Micheal Carey DOB: February 03, 1950 MRN: [id number]   Date of Service  09/18/2018  HPI/Events of Note  Pt with temp up to 102, she is on ancef for possible Strept infection. Enteral route is not currently available for Tylenol.  eICU Interventions  Iv Tylenol order entered. Blood cultures x 2 ordered.        [name] 09/18/2018, 10:07 PM

## 2018-09-18 NOTE — Progress Notes (Signed)
NAME:  Ranson Belluomini, MRN:  [id number], DOB:  [birth year], LOS: 24 ADMISSION DATE:  09/08/2018, CONSULTATION DATE:  09/08/2018 REFERRING MD:  EDP Darl Householder, CHIEF COMPLAINT:  Cardiac arrest and acute respiratory failure   Brief History   Mr. Jerardo is a 68 year old male with PMH of COPD, ETOH and opiate abuse who was found down in the parking lot with PEA and now s/p CPR and intubation and admitted for acute respiratory failure with hypoxemia.  History of present illness   68 year old male with history of ETOH and opiate abuse who was found down in the parking lot where he was found unresponsive by bystanders. Cardiac rhythm showed asystole first then PEA. CPR was done for 10 minutes with 1 epi and he pulses returned. Unknown down time before CPR. He became hemodynamically stable but remained completely unresponsive. Patient had a palpable pulse on arrival with some spontaneous movements but did not follow directions. Upon review of his chart, it was found that patient has hx of drug and alcohol problems. Patient was intubated in the ED. His alcohol level was 276 and UDS + for opiates. COVID negative. Suspect drug overdose as the cause of AMS. PCCM was called on consultation.   Past Medical History  Substance abuse, COPD  Significant Hospital Events   CPR 8/9 Intubation 8/9 >>> Tracheostomy 8/17 >>> Cortrak 8/17 >>> PEG placement 8/19 >>>  Consults:  PCCM Neurology  Procedures:  ETT 8/9>>> Trach 8/17 >>> Cortrak 8/17  PEG 8/19  Significant Diagnostic Tests:  CXR 8/9 >>> Low lung volume, no acute cardiopulmonary dz, ETT w/ tip 4.8 cm above carina Head CT 8/9 >>> No acute intracranial abnormality. CT C-spine >>> No acute cervical spine fracture. CXR 8/9 >>> I reviewed myself, ETT is in a good place CXR 8/10 >>> No acute cardiopulmonary abnormality. ETT stable and in appropriate location EEG 8/10 >>> Suggestive of profound severe encephalopathy. No seizures or epileptiform discharges  TTE  ECHO 8/10 >>> Normal LVSF, EF 60-65%, mild MR CXR 8/11 >>> ET in stable position. NGT slightly withdrawn, Suggested advancement of ~10 cm. No acute cardiopulmonary disease. MRI brain 8/12 >>> Supratentorial and infratentorial diffusion-weighted and T2/FLAIR signal abnormality c/w hypoxic/ischemic injury. Moderate chronic small vessel ischemic disease. EEG 8/12 >>> Suggestive of profound severe encephalopathy. No seizures or clear epileptiform discharges  CXR 8/13 >>> No edema or consolidation. ETT tip 4.9 cm above the carina CXR 8/14 >>> No acute abnormality CXR 8/17 >>> Satisfactory positioning of tracheostomy tube.  No acute findings.  Micro Data:  SARS coronavirus 2 8/9 >>> Negative MRSA PCR 8/9 >>> Negative Blood 8/9>>> No growth in 24 hr Urine 8/9>>> No growth Blood 8/12 >>> No growth in 5 day Respiratory culture 8/12 >>> few strep pneumo     Gram stain >>> Mod WBC, mostly PMNs, few gram + cocci, rare gram + rods  Antimicrobials:  Zosyn 8/12 >>> 8/13 Vancomycin 8/12 >>> 8/14 Rocephin 8/15 >>> 8/17  Interim history/subjective:  No acute events overnight. Plan for Peg cancelled yesterday and rescheduled for today. Family updated about PEG placement today. Continues to be on vent and tube feeds  Objective   Blood pressure 135/89, pulse 75, temperature 99.4 F (37.4 C), temperature source Axillary, resp. rate 12, height 5\' 8"  (1.727 m), weight 64.1 kg, SpO2 100 %.    Vent Mode: PRVC FiO2 (%):  [30 %] 30 % Set Rate:  [12 bmp] 12 bmp Vt Set:  [560 mL] 560 mL PEEP:  [  5 cmH20] 5 cmH20 Plateau Pressure:  [15 cmH20-29 cmH20] 16 cmH20   Intake/Output Summary (Last 24 hours) at 09/18/2018 0816 Last data filed at 09/18/2018 0600 Gross per 24 hour  Intake 1658.82 ml  Output 1675 ml  Net -16.18 ml   Filed Weights   09/16/18 1017 09/17/18 0500 09/18/18 0500  Weight: 60.4 kg 62.8 kg 64.1 kg   Examination: General: Acutely ill appearing male, grimacing to pain. Looks contracted  HENT: [place]/AT, PERRL, cortrack and trach in place Lungs: Intubated, CTAB, no increased WOB. No wheezing Cardiovascular: tachycardic, normal rhythm, Nl S1/S2, no m/r/g Abdomen: Soft, NT, ND and +BS Extremities: Well-perfused. Distal pulses 2+. No LE edema Neuro: On vent, pupils sluggish reaction to light, does not follow commands, moves all extremities spontaneously, no purposeful movement. Corneal reflex in touch. Reacts to painful stimuli. Skin: Warm. Well-perfused  Resolved Hospital Problem list   N/A  Assessment & Plan:  68 year old male with hx of COPD and substance abuse found in cardiac arrest likely 2/2 to drug overdose, now s/p resuscitation and intubation on vent management w/ acute respiratory failure and altered mental status.  Acute respiratory failure likely due to heroine overdose - Mental status precludes weaning off vent - Full vent support, wean as tolerated - PRN albuterol - Fentanyl drip down to 25 mcg - PEG placement today  Cardiac arrest: likely secondary to respiratory arrest from overdose - Resuscitated, continue telemetry  Acute anoxic encephalopathy - Head CT neg, EEG 8/10 suggestive of profound severe encephalopathy. No seizures or epileptiform discharges, repeat EEG 8/12 no change from previous - Brain MRI shows DWI and FLAIR changes suggestive of global hypoxic ischemic injury. 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 - Continue 1L free water q6h and D51/2NS @ 75/mL/hr - Daily BMET  Goals of care. - Ongoing discussion w/ children about moving him to facility after PEG placement - CM following, appreciate support  FEN: - Nutrition for TF -Thiamine and folic  acid supplementation - IV Protonix - SCD's - Discussing disposition with family - Bedrest  Labs   CBC: Recent Labs  Lab 09/12/18 0047 09/13/18 0328 09/14/18 0357  WBC 14.0* 9.4 8.5  NEUTROABS 11.5* 6.6 6.3  HGB 12.9* 13.0 13.1  HCT 38.0* 39.4 40.2  MCV 84.4 86.0 88.4  PLT 216 179 217    Basic Metabolic Panel: Recent Labs  Lab 09/12/18 0047 09/13/18 0328  09/14/18 0357 09/15/18 0420 09/15/18 1207 09/16/18 0602 09/17/18 0440 09/18/18 0418  NA 143  --    < >  --  153* 155* 154* 150* 146*  K 3.3*  --    < >  --  2.9* 3.6 3.6 3.0* 3.5  CL 109  --    < >  --  122* 123* 124* 121* 116*  CO2 22  --    < >  --  21* 20* 22 23 22   GLUCOSE 117*  --    < >  --  120* 123* 120* 120* 113*  BUN 19  --    < >  --  18 18 15 14 12   CREATININE 1.30*  --    < >  --  0.88 0.87 0.80  0.90 0.88  CALCIUM 8.3*  --    < >  --  9.0 8.9 9.0 8.7* 8.5*  MG 2.3 2.5*  --  2.4  --   --  2.1  --   --   PHOS 3.0 2.6  --  3.2  --   --   --   --   --    < > = values in this interval not displayed.   GFR: Estimated Creatinine Clearance: 75.9 mL/min (by C-G formula based on SCr of 0.88 mg/dL). Recent Labs  Lab 09/12/18 0047 09/13/18 0328 09/14/18 0357  WBC 14.0* 9.4 8.5  LATICACIDVEN  --   --  1.7    Liver Function Tests: Recent Labs  Lab 09/12/18 0047  AST 48*  ALT 22  ALKPHOS 64  BILITOT 2.1*  PROT 7.1  ALBUMIN 3.1*   No results for input(s): LIPASE, AMYLASE in the last 168 hours. No results for input(s): AMMONIA in the last 168 hours.  ABG    Component Value Date/Time   PHART 7.506 (H) 09/10/2018 0359   PCO2ART 32.5 09/10/2018 0359   PO2ART 177.0 (H) 09/10/2018 0359   HCO3 25.7 09/10/2018 0359   TCO2 27 09/10/2018 0359   ACIDBASEDEF 1.0 09/09/2018 0427   O2SAT 100.0 09/10/2018 0359     Coagulation Profile: Recent Labs  Lab 09/12/18 0047 09/16/18 1237  INR 1.2 1.2    Cardiac Enzymes: Recent Labs  Lab 09/14/18 0357  CKTOTAL 230  CKMB 1.8    HbA1C: Hgb A1c MFr  Bld  Date/Time Value Ref Range Status  10/15/2015 09:06 AM 4.7 (L) 4.8 - 5.6 % Final    Comment:    (NOTE)         Pre-diabetes: 5.7 - 6.4         Diabetes: >6.4         Glycemic control for adults with diabetes: <7.0   10/14/2015 07:01 PM 4.9 4.8 - 5.6 % Final    Comment:    (NOTE)         Pre-diabetes: 5.7 - 6.4         Diabetes: >6.4         Glycemic control for adults with diabetes: <7.0     CBG: Recent Labs  Lab 09/17/18 1621 09/17/18 2008 09/18/18 0024 09/18/18 0433 09/18/18 0804  GLUCAP 104* 129* 124* 108* 110*   Sharrell KuProsper Teddie Curd, MS4

## 2018-09-19 ENCOUNTER — Inpatient Hospital Stay (HOSPITAL_COMMUNITY): Payer: Medicare Other

## 2018-09-19 LAB — BASIC METABOLIC PANEL
Anion gap: 9 (ref 5–15)
BUN: 9 mg/dL (ref 8–23)
CO2: 21 mmol/L — ABNORMAL LOW (ref 22–32)
Calcium: 8.8 mg/dL — ABNORMAL LOW (ref 8.9–10.3)
Chloride: 109 mmol/L (ref 98–111)
Creatinine, Ser: 0.92 mg/dL (ref 0.61–1.24)
GFR calc Af Amer: >60 mL/min (ref >60)
GFR calc non Af Amer: >60 mL/min (ref >60)
Glucose, Bld: 121 mg/dL — ABNORMAL HIGH (ref 70–99)
Potassium: 3.8 mmol/L (ref 3.5–5.1)
Sodium: 139 mmol/L (ref 135–145)

## 2018-09-19 LAB — GLUCOSE, CAPILLARY
Glucose-Capillary: 117 mg/dL — ABNORMAL HIGH (ref 70–99)
Glucose-Capillary: 102 mg/dL — ABNORMAL HIGH (ref 70–99)
Glucose-Capillary: 108 mg/dL — ABNORMAL HIGH (ref 70–99)
Glucose-Capillary: 125 mg/dL — ABNORMAL HIGH (ref 70–99)
Glucose-Capillary: 156 mg/dL — ABNORMAL HIGH (ref 70–99)

## 2018-09-19 MED ORDER — CARVEDILOL 6.25 MG PO TABS
6.2500 mg | ORAL_TABLET | Freq: Two times a day (BID) | ORAL | Status: DC
  Administered 2018-09-19 – 2018-11-07 (×98): 6.25 mg
  Filled 2018-09-19 (×99): qty 1

## 2018-09-19 MED ORDER — OXYCODONE HCL 5 MG PO TABS
10.0000 mg | ORAL_TABLET | Freq: Four times a day (QID) | ORAL | Status: DC
  Administered 2018-09-19 – 2018-09-26 (×28): 10 mg
  Filled 2018-09-19 (×28): qty 2

## 2018-09-19 MED ORDER — PREGABALIN 25 MG PO CAPS
25.0000 mg | ORAL_CAPSULE | Freq: Two times a day (BID) | ORAL | Status: DC
  Administered 2018-09-19 – 2018-11-09 (×103): 25 mg
  Filled 2018-09-19 (×103): qty 1

## 2018-09-19 MED ORDER — AMLODIPINE BESYLATE 5 MG PO TABS
5.0000 mg | ORAL_TABLET | Freq: Every day | ORAL | Status: DC
  Administered 2018-09-19 – 2018-10-01 (×13): 5 mg
  Filled 2018-09-19 (×14): qty 1

## 2018-09-19 NOTE — Progress Notes (Signed)
Responded to ventilator alarm, patient fighting ventilator - high peak pressures, tachypneic, and vomiting. Patient suctioned orally and via trach with significant amount of tan colored secretions resembling tube feeding. Paged Dr. Tamala Julian and informed him of this, orders received to place PEG tube to low wall suction and discontinue tube feeding. Patient continuing high peak pressure alarm, RT notified.  Myrle Sheng, BSN, RN 107M/58M MICU

## 2018-09-19 NOTE — Progress Notes (Addendum)
CSW attempted to reach Horald Chestnut, social worker at the [facility] to request his participation in coordinating this patient's care, specifically a possible transfer to a facility in [place]. There was no answer at [phone] ext. [phone] so an additional voicemail was left requesting a return call.  Madilyn Fireman, MSW, LCSW-A Clinical Social Worker [facility] [phone]

## 2018-09-19 NOTE — Progress Notes (Signed)
Referring Physician(s): DR W Yacoub  Supervising Physician: Micheal Carey, Micheal  Patient Status[facility]audette Carey[facility]  Chief Complaint:  Percutaneous gastric tube placed in IR 8/19 Dysphagia   Subjective:  G tube in place No bleeding Spiked temp last night now 99.3   Allergies: Patient has no known allergies.  Medications: Prior to Admission medications   Medication Sig Start Date End Date Taking? Authorizing Provider  albuterol (PROVENTIL HFA;VENTOLIN HFA) 108 (90 Base) MCG/ACT inhaler Inhale 2 puffs into the lungs every 6 (six) hours as needed for wheezing or shortness of breath.    [provider]  albuterol (PROVENTIL) (2.5 MG/3ML) 0.083% nebulizer solution Take 2.5 mg by nebulization every 6 (six) hours as needed for wheezing or shortness of breath.    [provider]  amLODipine (NORVASC) 5 MG tablet Take 5 mg by mouth daily. 08/16/17   [provider]  budesonide-formoterol (SYMBICORT) 160-4.5 MCG/ACT inhaler Inhale 2 puffs into the lungs 2 (two) times daily.    [provider]  BYSTOLIC 5 MG tablet Take 5 mg by mouth daily. 04/06/18   [provider]  Cholecalciferol (VITAMIN D PO) Take 1 tablet by mouth daily.    [provider]  cloNIDine (CATAPRES) 0.1 MG tablet Take 1 tablet (0.1 mg total) by mouth every 6 (six) hours for 3 days. 10/27/17 10/30/17  Renne CriglerGeiple, Joshua, PA-C  diclofenac sodium (VOLTAREN) 1 % GEL APPLY 4 GRAMS AA QID PRN FOR PAIN 04/05/18   [provider]  Multiple Vitamins-Minerals (MULTIVITAMIN WITH MINERALS) tablet Take 1 tablet by mouth daily.    [provider]  naproxen (NAPROSYN) 500 MG tablet TK 1 T PO BID WF 12/20/17   [provider]  oxyCODONE-acetaminophen (PERCOCET) 7.5-325 MG tablet Take 1 tablet by mouth See admin instructions. Five times daily as needed for pain 09/10/17   [provider]  potassium chloride SA (K-DUR,KLOR-CON) 20 MEQ tablet Take 20 mEq by mouth 2 (two)  times daily.    [provider]  pregabalin (LYRICA) 100 MG capsule TK ONE C PO BID 11/14/17   [provider]  sildenafil (VIAGRA) 100 MG tablet Take 100 mg by mouth daily as needed for erectile dysfunction.    [provider]  tiZANidine (ZANAFLEX) 4 MG tablet TK 1 T PO BID PRN 03/06/18   [provider]     Vital Signs: BP (!) 152/82    Pulse 96    Temp 99.3 F (37.4 C) (Axillary)    Resp 14    Ht 5\' 8"  (1.727 m)    Wt 141 lb 5 oz (64.1 kg)    SpO2 100%    BMI 21.49 kg/m   Physical Exam Vitals signs reviewed.  Skin:    General: Skin is warm and dry.     Comments: Site is clean and dry No bleeding No hematoma     Imaging: Ct Abdomen Wo Contrast  Result Date: 09/17/2018 CLINICAL DATA:  Dysphagia, preop planning for gastrostomy placement EXAM: CT ABDOMEN WITHOUT CONTRAST TECHNIQUE: Multidetector CT imaging of the abdomen was performed following the standard protocol without IV contrast. COMPARISON:  04/12/2017 FINDINGS: Lower chest: patchy opacities in the dependent aspect of both lung bases may represent subsegmental atelectasis or infiltrates . No pleural or pericardial effusion. Hepatobiliary: No focal liver abnormality is seen. No gallstones, gallbladder wall thickening, or biliary dilatation. Pancreas: Unremarkable. No pancreatic ductal dilatation or surrounding inflammatory changes. Spleen: Normal in size without focal abnormality. Adrenals/Urinary Tract: Adrenal  glands are unremarkable. Kidneys are normal, without renal calculi, focal lesion, or hydronephrosis. Stomach/Bowel: The stomach is nondistended. The body and antrum are posterior to the left lobe of the liver but feeding tube extends to the duodenal bulb. Visualized portions of small bowel and colon unremarkable. Vascular/Lymphatic: No retroperitoneal or mesenteric adenopathy evident. Other: No ascites. No free air. Musculoskeletal: No acute or significant osseous findings. Small umbilical  hernia containing only mesenteric fat. IMPRESSION: 1. The decompressed stomach is posterior to the left lobe of the liver. Presumably appropriate gastric distension would distend the stomach infrahepatic and allow safe percutaneous access for gastrostomy as there is a significant window between the inferior margin of the liver and transverse colon. 2. No acute findings. 3. Feeding tube terminating in the duodenal bulb. 4. Small umbilical hernia containing only mesenteric fat. 5. Probable subsegmental atelectasis in both lung bases. Electronically Signed   By: Micheal Carey M.D.   On: 09/17/2018 07:47   Ir Gastrostomy Tube Mod Sed  Result Date: 09/18/2018 INDICATION: 68 year old male with dysphagia in need of durable percutaneous gastrostomy tube for enteral feeding. EXAM: Fluoroscopically guided placement of percutaneous pull-through gastrostomy tube Interventional Radiologist:  Micheal Peaches, MD MEDICATIONS: 2 g Ancef, 1 mg glucagon and 1 mg Versed; Antibiotics were administered within 1 hour of the procedure. ANESTHESIA/SEDATION: Versed 1 mg IV; Fentanyl drip Moderate Sedation Time:  11 minutes The patient was continuously monitored during the procedure by the interventional radiology nurse under my direct supervision. CONTRAST:  70mL OMNIPAQUE IOHEXOL 300 MG/ML  SOLN FLUOROSCOPY TIME:  Fluoroscopy Time: 2 minutes 0 seconds (6 mGy). COMPLICATIONS: None immediate. PROCEDURE: Informed written consent was obtained from the patient after a thorough discussion of the procedural risks, benefits and alternatives. All questions were addressed. Maximal Sterile Barrier Technique was utilized including caps, mask, sterile gowns, sterile gloves, sterile drape, hand hygiene and skin antiseptic. A timeout was performed prior to the initiation of the procedure. Maximal barrier sterile technique utilized including caps, mask, sterile gowns, sterile gloves, large sterile drape, hand hygiene, and chlorhexadine skin prep. An  angled catheter was advanced over a wire under fluoroscopic guidance through the nose, down the esophagus and into the body of the stomach. The stomach was then insufflated with several 100 ml of air. Fluoroscopy confirmed location of the gastric bubble, as well as inferior displacement of the barium stained colon. Under direct fluoroscopic guidance, a single T-tack was placed, and the anterior gastric wall drawn up against the anterior abdominal wall. Percutaneous access was then obtained into the mid gastric body with an 18 gauge sheath needle. Aspiration of air, and injection of contrast material under fluoroscopy confirmed needle placement. An Amplatz wire was advanced in the gastric body and the access needle exchanged for a 9-French vascular sheath. A snare device was advanced through the vascular sheath and an Amplatz wire advanced through the angled catheter. The Amplatz wire was successfully snared and this was pulled up through the esophagus and out the mouth. A 20-French Alinda Dooms MIC-PEG tube was then connected to the snare and pulled through the mouth, down the esophagus, into the stomach and out to the anterior abdominal wall. Hand injection of contrast material confirmed intragastric location. The T-tack retention suture was then cut. The pull through peg tube was then secured with the external bumper and capped. The patient will be observed for several hours with the newly placed tube on low wall suction to evaluate for any post procedure complication. The patient tolerated the procedure  well, there is no immediate complication. IMPRESSION: Successful placement of a 20 French pull through gastrostomy tube. Electronically Signed   By: Malachy MoanHeath  McCullough M.D.   On: 09/18/2018 14:24   Dg Chest Port 1 View  Result Date: 09/16/2018 CLINICAL DATA:  Tracheostomy EXAM: PORTABLE CHEST 1 VIEW COMPARISON:  09/13/2018 FINDINGS: Interval removal of endotracheal and enteric tubes. Interval placement of  tracheostomy tube with distal tip positioned in the midthoracic trachea. Cardiomediastinal silhouette is stable. Lungs are clear. No pleural effusion or pneumothorax. IMPRESSION: Satisfactory positioning of tracheostomy tube.  No acute findings. Electronically Signed   By: Duanne GuessNicholas  Plundo M.D.   On: 09/16/2018 13:52    Labs:  CBC: Recent Labs    09/10/18 0402 09/12/18 0047 09/13/18 0328 09/14/18 0357  WBC 12.1* 14.0* 9.4 8.5  HGB 13.2 12.9* 13.0 13.1  HCT 38.7* 38.0* 39.4 40.2  PLT 223 216 179 217    COAGS: Recent Labs    09/08/18 2037 09/12/18 0047 09/16/18 1237  INR 1.4* 1.2 1.2  APTT 28  --  40*    BMP: Recent Labs    09/16/18 0602 09/17/18 0440 09/18/18 0418 09/19/18 0257  NA 154* 150* 146* 139  K 3.6 3.0* 3.5 3.8  CL 124* 121* 116* 109  CO2 22 23 22  21*  GLUCOSE 120* 120* 113* 121*  BUN 15 14 12 9   CALCIUM 9.0 8.7* 8.5* 8.8*  CREATININE 0.80 0.90 0.88 0.92  GFRNONAA >60 >60 >60 >60  GFRAA >60 >60 >60 >60    LIVER FUNCTION TESTS: Recent Labs    10/27/17 1348 09/08/18 1619 09/08/18 2037 09/12/18 0047  BILITOT 2.0* 0.4 1.0 2.1*  AST 50* 86* 97* 48*  ALT 28 33 32 22  ALKPHOS 91 89 70 64  PROT 8.2* 7.0 6.4* 7.1  ALBUMIN 4.7 3.7 3.5 3.1*    Assessment and Plan:  G tube intact No hematoma Spiked temp last pm-- now low grade May use G tube now  Electronically Signed: Robet LeuPamela A Kimi Bordeau, PA-C 09/19/2018, 6:49 AM   I spent a total of 15 Minutes at the the patient's bedside AND on the patient's hospital floor or unit, greater than 50% of which was counseling/coordinating care for perc G tube

## 2018-09-19 NOTE — Progress Notes (Signed)
EEG Completed; Results Pending  

## 2018-09-19 NOTE — Progress Notes (Signed)
[facility] Progress Note Patient Name: Willey Due DOB: 1950-09-22 MRN: [id number]   Date of Service  09/19/2018  HPI/Events of Note  Aspiration event following vomiting, and subsequent ventilator dyssynchrony.  eICU Interventions  Fentanyl infusion rate increased to optimize sedation. Portable CXR ordered.        Okoron[place]wo U Ogan 09/19/2018, 8:00 PM

## 2018-09-19 NOTE — Progress Notes (Signed)
NAME:  Micheal Carey, MRN:  [id number], DOB:  [birth year], LOS: 11 ADMISSION DATE:  09/08/2018, CONSULTATION DATE:  09/08/2018 REFERRING MD:  EDP Silverio Lay- Yao, CHIEF COMPLAINT:  Cardiac arrest and acute respiratory failure   Brief History   Mr. Micheal Carey is a 68 year old male with PMH of COPD, ETOH and opiate abuse who was found down in the parking lot with PEA and now s/p CPR and intubation and admitted for acute respiratory failure with hypoxemia.  History of present illness   68 year old male with history of ETOH and opiate abuse who was found down in the parking lot where he was found unresponsive by bystanders. Cardiac rhythm showed asystole first then PEA. CPR was done for 10 minutes with 1 epi and he pulses returned. Unknown down time before CPR. He became hemodynamically stable but remained completely unresponsive. Patient had a palpable pulse on arrival with some spontaneous movements but did not follow directions. Upon review of his chart, it was found that patient has hx of drug and alcohol problems. Patient was intubated in the ED. His alcohol level was 276 and UDS + for opiates. COVID negative. Suspect drug overdose as the cause of AMS. PCCM was called on consultation.   Past Medical History  Substance abuse, COPD  Significant Hospital Events   CPR 8/9 Intubation 8/9 >>> Tracheostomy 8/17 >>> Cortrak 8/17 >>> PEG placement 8/19 >>>  Consults:  PCCM Neurology  Procedures:  ETT 8/9>>> Trach 8/17 >>> Cortrak 8/17  PEG 8/19 >>> Successful placement of a 20 French pull through gastrostomy tube.  Significant Diagnostic Tests:  CXR 8/9 >>> Low lung volume, no acute cardiopulmonary dz, ETT w/ tip 4.8 cm above carina Head CT 8/9 >>> No acute intracranial abnormality. CT C-spine >>> No acute cervical spine fracture. CXR 8/9 >>> I reviewed myself, ETT is in a good place CXR 8/10 >>> No acute cardiopulmonary abnormality. ETT stable and in appropriate location EEG 8/10 >>> Suggestive of  profound severe encephalopathy. No seizures or epileptiform discharges  TTE ECHO 8/10 >>> Normal LVSF, EF 60-65%, mild MR CXR 8/11 >>> ET in stable position. NGT slightly withdrawn, Suggested advancement of ~10 cm. No acute cardiopulmonary disease. MRI brain 8/12 >>> Supratentorial and infratentorial diffusion-weighted and T2/FLAIR signal abnormality c/w hypoxic/ischemic injury. Moderate chronic small vessel ischemic disease. EEG 8/12 >>> Suggestive of profound severe encephalopathy. No seizures or clear epileptiform discharges  CXR 8/13 >>> No edema or consolidation. ETT tip 4.9 cm above the carina CXR 8/14 >>> No acute abnormality CXR 8/17 >>> Satisfactory positioning of tracheostomy tube.  No acute findings.  Micro Data:  SARS coronavirus 2 8/9 >>> Negative MRSA PCR 8/9 >>> Negative Blood 8/9>>> No growth in 24 hr Urine 8/9>>> No growth Blood 8/12 >>> No growth in 5 day Respiratory culture 8/12 >>> few strep pneumo     Gram stain >>> Mod WBC, mostly PMNs, few gram + cocci, rare gram + rods Blood 8/20 >>> pending  Antimicrobials:  Zosyn 8/12 >>> 8/13 Vancomycin 8/12 >>> 8/14 Rocephin 8/15 >>> 8/17  Interim history/subjective:  Patient had PEG tube placed successfully yesterday. Overnight, he had a temp up to 102.8. IV tylenol was ordered and blood cultures were sent. This AM, no significant change in neruological status. Continues to grimace to pain, opens eyes ocasionally and moves all extremities spontaneously.  Objective   Blood pressure (!) 161/78, pulse 80, temperature 99 F (37.2 C), temperature source Axillary, resp. rate 14, height 5\' 8"  (1.727 m),  weight 64.1 kg, SpO2 100 %.    Vent Mode: PRVC FiO2 (%):  [30 %-100 %] 30 % Set Rate:  [12 bmp] 12 bmp Vt Set:  [506 mL-560 mL] 560 mL PEEP:  [5 cmH20] 5 cmH20 Plateau Pressure:  [19 cmH20] 19 cmH20   Intake/Output Summary (Last 24 hours) at 09/19/2018 0836 Last data filed at 09/19/2018 0800 Gross per 24 hour  Intake 2185.7  ml  Output 2175 ml  Net 10.7 ml   Filed Weights   09/16/18 1017 09/17/18 0500 09/18/18 0500  Weight: 60.4 kg 62.8 kg 64.1 kg   Examination: General: Acutely ill appearing male, grimacing to pain. HENT: [place]/AT, PERRL, PEG tube in place, clean and intach Lungs: Intubated, CTAB, no increased WOB. No wheezing Cardiovascular: tachycardic, normal rhythm, Nl S1/S2, no m/r/g Abdomen: Soft, NT, ND and +BS Extremities: Well-perfused. Distal pulses 2+. No LE edema Neuro: On vent, pupils sluggish reaction to light, does not follow commands, moves all extremities spontaneously, no purposeful movement. Corneal reflex in touch. Reacts to painful stimuli. Skin: Warm. Well-perfused  Resolved Hospital Problem list   N/A  Assessment & Plan:  68 year old male with hx of COPD and substance abuse found in cardiac arrest likely 2/2 to drug overdose, now s/p resuscitation and intubation on vent management w/ acute respiratory failure and altered mental status. Now with PEG tube and trach  Acute respiratory failure likely due to heroine overdose - Mental status precludes weaning off vent - Full vent support, wean as tolerated - PRN albuterol - Fentanyl drip 50  Cardiac arrest: likely secondary to respiratory arrest from overdose - Resuscitated, continue telemetry  Acute anoxic encephalopathy - Head CT neg, EEG 8/10 suggestive of profound severe encephalopathy. No seizures or epileptiform discharges, repeat EEG 8/12 no change from previous - Brain MRI shows DWI and FLAIR changes suggestive of global hypoxic ischemic injury. - F/u repeat EEG - Wean off IV sedation - Continue Seroquel 25 mg BID, oxy PRN - Start lyrica 25 mg BID  Pneumococcal pneumonia - Resp culture grew few strep pneumo - Off abx, spiked fever overnight - Given IV tylenol, new cultures sent - Start abx if culture are positive  AKI: Improved - Na+ improved to 139, K+ improved to 3.8 - D/c fluids - Daily BMET  HTN. - sBP in  150s and 160s - Restart home meds  Goals of care. - Ongoing discussion w/ children about moving him to facility after PEG placement - CM following, appreciate support  FEN: - Nutrition for TF -Thiamine and folic acid supplementation - IV Protonix - Lovenox and SCD's - Discussing disposition with family - Bedrest  Labs   CBC: Recent Labs  Lab 09/13/18 0328 09/14/18 0357  WBC 9.4 8.5  NEUTROABS 6.6 6.3  HGB 13.0 13.1  HCT 39.4 40.2  MCV 86.0 88.4  PLT 179 409    Basic Metabolic Panel: Recent Labs  Lab 09/13/18 0328  09/14/18 0357  09/15/18 1207 09/16/18 0602 09/17/18 0440 09/18/18 0418 09/19/18 0257  NA  --    < >  --    < > 155* 154* 150* 146* 139  K  --    < >  --    < > 3.6 3.6 3.0* 3.5 3.8  CL  --    < >  --    < > 123* 124* 121* 116* 109  CO2  --    < >  --    < > 20* 22 23 22  21*  GLUCOSE  --    < >  --    < > 123* 120* 120* 113* 121*  BUN  --    < >  --    < > 18 15 14 12 9   CREATININE  --    < >  --    < > 0.87 0.80 0.90 0.88 0.92  CALCIUM  --    < >  --    < > 8.9 9.0 8.7* 8.5* 8.8*  MG 2.5*  --  2.4  --   --  2.1  --   --   --   PHOS 2.6  --  3.2  --   --   --   --   --   --    < > = values in this interval not displayed.   GFR: Estimated Creatinine Clearance: 72.6 mL/min (by C-G formula based on SCr of 0.92 mg/dL). Recent Labs  Lab 09/13/18 0328 09/14/18 0357  WBC 9.4 8.5  LATICACIDVEN  --  1.7    Liver Function Tests: No results for input(s): AST, ALT, ALKPHOS, BILITOT, PROT, ALBUMIN in the last 168 hours. No results for input(s): LIPASE, AMYLASE in the last 168 hours. No results for input(s): AMMONIA in the last 168 hours.  ABG    Component Value Date/Time   PHART 7.506 (H) 09/10/2018 0359   PCO2ART 32.5 09/10/2018 0359   PO2ART 177.0 (H) 09/10/2018 0359   HCO3 25.7 09/10/2018 0359   TCO2 27 09/10/2018 0359   ACIDBASEDEF 1.0 09/09/2018 0427   O2SAT 100.0 09/10/2018 0359     Coagulation Profile: Recent Labs  Lab 09/16/18 1237   INR 1.2    Cardiac Enzymes: Recent Labs  Lab 09/14/18 0357  CKTOTAL 230  CKMB 1.8    HbA1C: Hgb A1c MFr Bld  Date/Time Value Ref Range Status  10/15/2015 09:06 AM 4.7 (L) 4.8 - 5.6 % Final    Comment:    (NOTE)         Pre-diabetes: 5.7 - 6.4         Diabetes: >6.4         Glycemic control for adults with diabetes: <7.0   10/14/2015 07:01 PM 4.9 4.8 - 5.6 % Final    Comment:    (NOTE)         Pre-diabetes: 5.7 - 6.4         Diabetes: >6.4         Glycemic control for adults with diabetes: <7.0     CBG: Recent Labs  Lab 09/18/18 1608 09/18/18 1943 09/18/18 2343 09/19/18 0341 09/19/18 0753  GLUCAP 100* 119* 133* 117* 102*   Sharrell KuProsper Tiona Ruane, MS4

## 2018-09-19 NOTE — Procedures (Signed)
Patient Name: Micheal Carey  MRN: [id number]  Epilepsy Attending: Lora Havens  Referring Physician/Provider:Dr  Ina Homes Date: 09/19/2018 Duration:23.11 minutes  Patient history:68 year old male with history of cardiac arrest. EEG to evaluate for seizures.  Level of alertness:stupor  Technical aspects: This EEG study was done with scalp electrodes positioned according to the 10-20 International system of electrode placement. Electrical activity was acquired at a sampling rate of 500Hz  and reviewed with a high frequency filter of 70Hz  and a low frequency filter of 1Hz . EEG data were recorded continuously and digitally stored.  Description: EEG showed  continuous generalized rhythmic 2 to 6 Hz theta delta slowing.  EEG was reactive to stimulation.  Hyperventilation and photic stimulation were not performed.   IMPRESSION: This study issuggestive of severe encephalopathy. No seizures or clear epileptiform discharges were seen throughout the recording.   It appears improved compared to previous EEG on 09/11/2018   09/19/2018     09/11/2018   [place]

## 2018-09-20 DIAGNOSIS — Z7289 Other problems related to lifestyle: Secondary | ICD-10-CM

## 2018-09-20 DIAGNOSIS — Z9289 Personal history of other medical treatment: Secondary | ICD-10-CM

## 2018-09-20 LAB — GLUCOSE, CAPILLARY
Glucose-Capillary: 109 mg/dL — ABNORMAL HIGH (ref 70–99)
Glucose-Capillary: 114 mg/dL — ABNORMAL HIGH (ref 70–99)
Glucose-Capillary: 115 mg/dL — ABNORMAL HIGH (ref 70–99)
Glucose-Capillary: 119 mg/dL — ABNORMAL HIGH (ref 70–99)
Glucose-Capillary: 119 mg/dL — ABNORMAL HIGH (ref 70–99)
Glucose-Capillary: 120 mg/dL — ABNORMAL HIGH (ref 70–99)
Glucose-Capillary: 140 mg/dL — ABNORMAL HIGH (ref 70–99)

## 2018-09-20 MED ORDER — PRO-STAT SUGAR FREE PO LIQD
30.0000 mL | Freq: Three times a day (TID) | ORAL | Status: DC
  Administered 2018-09-20 – 2018-10-02 (×37): 30 mL
  Filled 2018-09-20 (×37): qty 30

## 2018-09-20 MED ORDER — VITAL AF 1.2 CAL PO LIQD: 1000.0000 mL | ORAL | Status: DC

## 2018-09-20 MED ORDER — MAGNESIUM HYDROXIDE 400 MG/5ML PO SUSP
15.0000 mL | Freq: Every day | ORAL | Status: DC
  Administered 2018-09-20 – 2018-09-23 (×4): 15 mL
  Filled 2018-09-20 (×5): qty 30

## 2018-09-20 MED ORDER — JEVITY 1.2 CAL PO LIQD
1000.0000 mL | ORAL | Status: DC
  Administered 2018-09-20 – 2018-09-23 (×2): 1000 mL
  Filled 2018-09-20 (×6): qty 1000

## 2018-09-20 MED ORDER — METOCLOPRAMIDE HCL 5 MG/ML IJ SOLN
10.0000 mg | Freq: Four times a day (QID) | INTRAMUSCULAR | Status: DC
  Administered 2018-09-20 – 2018-09-21 (×6): 10 mg via INTRAVENOUS
  Filled 2018-09-20 (×6): qty 2

## 2018-09-20 MED ORDER — VITAL HIGH PROTEIN PO LIQD: 1000.0000 mL | ORAL | Status: DC

## 2018-09-20 NOTE — Plan of Care (Signed)
  Problem: Elimination: Goal: Will not experience complications related to bowel motility Outcome: Progressing Goal: Will not experience complications related to urinary retention Outcome: Progressing   Problem: Safety: Goal: Ability to remain free from injury will improve Outcome: Not Progressing

## 2018-09-20 NOTE — Progress Notes (Addendum)
Nutrition Follow-up / Consult  DOCUMENTATION CODES:   Non-severe (moderate) malnutrition in context of social or environmental circumstances  INTERVENTION:    Change TF to Jevity 1.2 at 20 ml/h, increase by 10 ml every 8 hours to goal rate 50 ml/h.   Pro-stat 30 ml TID.   Provides 1740 kcal, 112 gm protein, 972 ml free water daily.   If aspiration recurs, suggest converting G tube to G-J tube to decrease the risk of aspiration.   NUTRITION DIAGNOSIS:   Moderate Malnutrition related to social / environmental circumstances(Hx substance abuse) as evidenced by moderate muscle depletion, moderate fat depletion.  Ongoing   GOAL:   Patient will meet greater than or equal to 90% of their needs  Progressing   MONITOR:   Vent status, TF tolerance, Labs  REASON FOR ASSESSMENT:   Consult Enteral/tube feeding initiation and management  ASSESSMENT:   68 yo male admitted with PEA arrest after being found down in a parking lot. Urine screen positive for opiates, ETOH positive. PMH includes alcohol and opiate abuse, CAP.  S/P PEG placement in IR 8/19.  TF resumed 8/20 via PEG: Vital AF 1.2 at 60 ml/h.  Developed fever from suspected aspiration yesterday, so TF held.  Received MD Consult for TF initiation and management. TF being resumed at trickle rate. Reglan added.   Patient remains on ventilator support via trach MV: 7.1 L/min Temp (24hrs), Avg:99.6 F (37.6 C), Min:98.3 F (36.8 C), Max:102.8 F (39.3 C)   Labs reviewed.  CBG's: 114-140-119  Medications reviewed and include folic acid, novolog, milk of magnesia, reglan, protonix, thiamine.   Weight down 4.2 kg since admission. I/O Net +4 L No edema documented today.   Diet Order:   Diet Order    None      EDUCATION NEEDS:   No education needs have been identified at this time  Skin:  Skin Assessment: Reviewed RN Assessment  Last BM:  8/21 type 7  Height:   Ht Readings from Last 1 Encounters:   09/08/18 5\' 8"  (1.727 m)    Weight:   Wt Readings from Last 1 Encounters:  09/20/18 63.8 kg    Ideal Body Weight:  70 kg  BMI:  Body mass index is 21.39 kg/m.  Estimated Nutritional Needs:   Kcal:  1700  Protein:  100-115 gm  Fluid:  >/= 1.7 L   Molli Barrows, RD, LDN, [place] Pager [phone] After Hours Pager [phone]

## 2018-09-20 NOTE — Progress Notes (Signed)
PROGRESS NOTE    Micheal Carey  MRN:[id number] DOB: 03/22/1950 DOA: 09/08/2018 PCP: Avbuere, Edwin, MD  Brief Narrative: PCCM transfer to [facility] 8/21 -67-year-old male found in parking lot with PR PEA arrest, unknown downtime required mechanical ventilation, UDS was positive for opiates and alcohol level was 276. 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 -Could not be liberated from a ventilator subsequently was trached on 8/17 -s/p PEG tube on 8/19 -PCCM following and managing vent, sedation, weaning trials   Assessment & Plan:   Acute hypoxic respiratory failure -From accidental opiate/alcohol overdose -Background of COPD -Admitted following PEA arrest with unknown downtime -Complicated by severe anoxic encephalopathy and persistent vegetative state -Failed to wean from ventilator due to anoxic encephalopathy, -Status post tracheostomy on 8/17 -Also treated for pneumococcal pneumonia -Continue to attempt pressure support trials as tolerated per PCCM,  -Continue to wean fentanyl, restarted on Lyrica, oxycodone-per CCM  Cardiac arrest -This was a respiratory arrest from overdose -As above  Dysphagia -Status post PEG tube 8/19 -Started tube feeds yesterday but reportedly had an episode of vomiting, tube feeds subsequently held, G-tube was put on suction, will clamp G-tube and attempt to start tube feeds at a low rate later today  Severe anoxic encephalopathy -By neurology, earlier this admission EEG and MRI findings suggestive of severe anoxic brain injury -This was discussed with family, prognosis felt to be very poor -Repeat EEG yesterday 8/20 suggestive of severe encephalopathy as well  Pneumococcal pneumonia -Respiratory cultures grew few strep pneumo -blood cultures are negative and afebrile now -Initially treated with Zosyn from 8/12 to 8/13 and then Rocephin from 8/15  to 8/17, off antibiotics now -Reculture if spikes again, last x-ray is unremarkable  Goals of care: -Per PCCM felt to have very poor prognosis with severe anoxic encephalopathy, unknown downtime following cardiac arrest, based on EEG, MRI and neurology recommendations Multiple discussions have been held with family, they want to proceed with full scope of treatment, will need vent SNF  Alcohol abuse -Out of window for withdrawal, maintained on thiamine  COPD -Stable, nebs PRN  DVT prophylaxis: Lovenox Code Status: Full code Family Communication: No family at bedside Disposition Plan: Will need SNF with vent versus LTAC  Consultants:   PCCM following   Procedures:   Antimicrobials:    Subjective: -Vomited tube feeds yesterday, G-tube put to suction -Remains on the ventilator, unable to tolerate pressure support trials  Objective: Vitals:   09/20/18 0800 09/20/18 0832 09/20/18 0900 09/20/18 1000  BP: 103/66  106/68   Pulse: 86  94 84  Resp: 12  (!) 23 14  Temp:      TempSrc:      SpO2: 100% 99% 100% 99%  Weight:      Height:        Intake/Output Summary (Last 24 hours) at 09/20/2018 1109 Last data filed at 09/20/2018 1000 Gross per 24 hour  Intake 528.07 ml  Output 890 ml  Net -361.93 ml   Filed Weights   09/17/18 0500 09/18/18 0500 09/20/18 0445  Weight: 62.8 kg 64.1 kg 63.8 kg    Examination:  General exam: Obtunded, opens eyes intermittently but does not track or follow commands Respiratory system: Few conducted upper airway sounds, on vent. Cardiovascular system: S1 & S2 heard, RRR. Gastrointestinal system: Abdomen is nondistended, soft and nontender.Normal bowel sounds heard. Central nervous system: Obtunded, pupils are reactive, unable to follow commands Extremities: No   edema Skin: No rashes, lesions or ulcers Psychiatry: Unable to assess    Data Reviewed:   CBC: Recent Labs  Lab 09/14/18 0357  WBC 8.5  NEUTROABS 6.3  HGB 13.1  HCT 40.2   MCV 88.4  PLT 217   Basic Metabolic Panel: Recent Labs  Lab 09/14/18 0357  09/15/18 1207 09/16/18 0602 09/17/18 0440 09/18/18 0418 09/19/18 0257  NA  --    < > 155* 154* 150* 146* 139  K  --    < > 3.6 3.6 3.0* 3.5 3.8  CL  --    < > 123* 124* 121* 116* 109  CO2  --    < > 20* 22 23 22 21*  GLUCOSE  --    < > 123* 120* 120* 113* 121*  BUN  --    < > 18 15 14 12 9  CREATININE  --    < > 0.87 0.80 0.90 0.88 0.92  CALCIUM  --    < > 8.9 9.0 8.7* 8.5* 8.8*  MG 2.4  --   --  2.1  --   --   --   PHOS 3.2  --   --   --   --   --   --    < > = values in this interval not displayed.   GFR: Estimated Creatinine Clearance: 72.2 mL/min (by C-G formula based on SCr of 0.92 mg/dL). Liver Function Tests: No results for input(s): AST, ALT, ALKPHOS, BILITOT, PROT, ALBUMIN in the last 168 hours. No results for input(s): LIPASE, AMYLASE in the last 168 hours. No results for input(s): AMMONIA in the last 168 hours. Coagulation Profile: Recent Labs  Lab 09/16/18 1237  INR 1.2   Cardiac Enzymes: Recent Labs  Lab 09/14/18 0357  CKTOTAL 230  CKMB 1.8   BNP (last 3 results) No results for input(s): PROBNP in the last 8760 hours. HbA1C: No results for input(s): HGBA1C in the last 72 hours. CBG: Recent Labs  Lab 09/19/18 1532 09/19/18 2013 09/20/18 0035 09/20/18 0406 09/20/18 0718  GLUCAP 125* 156* 114* 140* 119*   Lipid Profile: No results for input(s): CHOL, HDL, LDLCALC, TRIG, CHOLHDL, LDLDIRECT in the last 72 hours. Thyroid Function Tests: No results for input(s): TSH, T4TOTAL, FREET4, T3FREE, THYROIDAB in the last 72 hours. Anemia Panel: No results for input(s): VITAMINB12, FOLATE, FERRITIN, TIBC, IRON, RETICCTPCT in the last 72 hours. Urine analysis:    Component Value Date/Time   COLORURINE YELLOW 09/08/2018 1641   APPEARANCEUR HAZY (A) 09/08/2018 1641   LABSPEC 1.017 09/08/2018 1641   PHURINE 6.0 09/08/2018 1641   GLUCOSEU 150 (A) 09/08/2018 1641   HGBUR SMALL  (A) 09/08/2018 1641   BILIRUBINUR NEGATIVE 09/08/2018 1641   KETONESUR NEGATIVE 09/08/2018 1641   PROTEINUR 100 (A) 09/08/2018 1641   NITRITE NEGATIVE 09/08/2018 1641   LEUKOCYTESUR NEGATIVE 09/08/2018 1641   Sepsis Labs: @LABRCNTIP(procalcitonin:4,lacticidven:4)  ) Recent Results (from the past 240 hour(s))  Culture, respiratory (non-expectorated)     Status: None   Collection Time: 09/11/18 12:49 AM   Specimen: Tracheal Aspirate; Respiratory  Result Value Ref Range Status   Specimen Description TRACHEAL ASPIRATE  Final   Special Requests Normal  Final   Gram Stain   Final    MODERATE WBC PRESENT, PREDOMINANTLY PMN FEW GRAM POSITIVE COCCI RARE GRAM POSITIVE RODS Performed at [facility], [address] [place], [place] [address]    Culture FEW STREPTOCOCCUS PNEUMONIAE  Final   Report Status   09/14/2018 FINAL  Final   Organism ID, Bacteria STREPTOCOCCUS PNEUMONIAE  Final      Susceptibility   Streptococcus pneumoniae - MIC*    TETRACYCLINE <=0.25 SENSITIVE Sensitive     VANCOMYCIN 0.25 SENSITIVE Sensitive     CLINDAMYCIN <=0.25 SENSITIVE Sensitive     PENICILLIN SENSITIVE Sensitive     CEFTRIAXONE SENSITIVE Sensitive     * FEW STREPTOCOCCUS PNEUMONIAE  Culture, blood (Routine X 2) w Reflex to ID Panel     Status: None   Collection Time: 09/11/18  1:45 AM   Specimen: BLOOD RIGHT HAND  Result Value Ref Range Status   Specimen Description BLOOD RIGHT HAND  Final   Special Requests   Final    BOTTLES DRAWN AEROBIC AND ANAEROBIC Blood Culture adequate volume   Culture   Final    NO GROWTH 5 DAYS Performed at [facility], [address] [place], [place] [address]    Report Status 09/16/2018 FINAL  Final  Culture, blood (Routine X 2) w Reflex to ID Panel     Status: None   Collection Time: 09/11/18  1:45 AM   Specimen: BLOOD LEFT HAND  Result Value Ref Range Status   Specimen Description BLOOD LEFT HAND  Final   Special Requests   Final    BOTTLES DRAWN  AEROBIC AND ANAEROBIC Blood Culture adequate volume   Culture   Final    NO GROWTH 5 DAYS Performed at [facility], [address] [place], [place] [address]    Report Status 09/16/2018 FINAL  Final  MRSA PCR Screening     Status: Abnormal   Collection Time: 09/11/18  5:32 AM   Specimen: Nasopharyngeal  Result Value Ref Range Status   MRSA by PCR (A) NEGATIVE Final    INVALID, UNABLE TO DETERMINE THE PRESENCE OF TARGET DUE TO SPECIMEN INTEGRITY. RECOLLECTION REQUESTED.    Comment:        The GeneXpert MRSA Assay (FDA approved for NASAL specimens only), is one component of a comprehensive MRSA colonization surveillance program. It is not intended to diagnose MRSA infection nor to guide or monitor treatment for MRSA infections. Results Called to: K. WILSON, RN AT 0850 ON 09/11/18 BY C. JESSUP, MT. Performed at [facility], [address] [place], [place] [address]   MRSA culture     Status: None   Collection Time: 09/11/18  9:40 AM   Specimen: Nasal Mucosa  Result Value Ref Range Status   Specimen Description NASOPHARYNGEAL  Final   Special Requests NONE  Final   Culture   Final    NO MRSA DETECTED Performed at [facility], [address] [place],  [address]    Report Status 09/12/2018 FINAL  Final  SARS Coronavirus 2 (Hospital order, Performed in [place] hospital lab)     Status: None   Collection Time: 09/17/18  9:15 AM  Result Value Ref Range Status   SARS Coronavirus 2 NEGATIVE NEGATIVE Final    Comment: (NOTE) If result is NEGATIVE SARS-CoV-2 target nucleic acids are NOT DETECTED. The SARS-CoV-2 RNA is generally detectable in upper and lower  respiratory specimens during the acute phase of infection. The lowest  concentration of SARS-CoV-2 viral copies this assay can detect is 250  copies / mL. A negative result does not preclude SARS-CoV-2 infection  and should not be used as the sole basis for treatment or other  patient  management decisions.  A negative result   may occur with  improper specimen collection / handling, submission of specimen other  than nasopharyngeal swab, presence of viral mutation(s) within the  areas targeted by this assay, and inadequate number of viral copies  (<250 copies / mL). A negative result must be combined with clinical  observations, patient history, and epidemiological information. If result is POSITIVE SARS-CoV-2 target nucleic acids are DETECTED. The SARS-CoV-2 RNA is generally detectable in upper and lower  respiratory specimens dur ing the acute phase of infection.  Positive  results are indicative of active infection with SARS-CoV-2.  Clinical  correlation with patient history and other diagnostic information is  necessary to determine patient infection status.  Positive results do  not rule out bacterial infection or co-infection with other viruses. If result is PRESUMPTIVE POSTIVE SARS-CoV-2 nucleic acids MAY BE PRESENT.   A presumptive positive result was obtained on the submitted specimen  and confirmed on repeat testing.  While 2019 novel coronavirus  (SARS-CoV-2) nucleic acids may be present in the submitted sample  additional confirmatory testing may be necessary for epidemiological  and / or clinical management purposes  to differentiate between  SARS-CoV-2 and other Sarbecovirus currently known to infect humans.  If clinically indicated additional testing with an alternate test  methodology [id number]) is advised. The SARS-CoV-2 RNA is generally  detectable in upper and lower respiratory sp ecimens during the acute  phase of infection. The expected result is Negative. Fact Sheet for Patients:  StrictlyIdeas.no Fact Sheet for Healthcare Providers: BankingDealers.co.za This test is not yet approved or cleared by the [place] FDA and has been authorized for detection and/or diagnosis of SARS-CoV-2 by FDA under  an Emergency Use Authorization (EUA).  This EUA will remain in effect (meaning this test can be used) for the duration of the COVID-19 declaration under Section 564(b)(1) of the Act, 21 U.S.C. section 360bbb-3(b)(1), unless the authorization is terminated or revoked sooner. Performed at [facility], [place] [address], [place], [place] [address]   Culture, blood (routine x 2)     Status: None (Preliminary result)   Collection Time: 09/18/18 10:29 PM   Specimen: BLOOD LEFT FOREARM  Result Value Ref Range Status   Specimen Description BLOOD LEFT FOREARM  Final   Special Requests   Final    BOTTLES DRAWN AEROBIC AND ANAEROBIC Blood Culture results may not be optimal due to an inadequate volume of blood received in culture bottles   Culture   Final    NO GROWTH 2 DAYS Performed at [facility], [place] [address], [place], [place] [address]    Report Status PENDING  Incomplete  Culture, blood (routine x 2)     Status: None (Preliminary result)   Collection Time: 09/18/18 10:33 PM   Specimen: BLOOD LEFT HAND  Result Value Ref Range Status   Specimen Description BLOOD LEFT HAND  Final   Special Requests   Final    BOTTLES DRAWN AEROBIC AND ANAEROBIC Blood Culture results may not be optimal due to an inadequate volume of blood received in culture bottles   Culture   Final    NO GROWTH 2 DAYS Performed at [facility], [place] [address] [place], [place] [address]    Report Status PENDING  Incomplete         Radiology Studies: Ir Gastrostomy Tube Mod Sed  Result Date: 09/18/2018 INDICATION: 68 year old male with dysphagia in need of durable percutaneous gastrostomy tube for enteral feeding. EXAM: Fluoroscopically guided placement of percutaneous  pull-through gastrostomy tube Interventional Radiologist:  Criselda Peaches, MD MEDICATIONS: 2 g Ancef, 1 mg glucagon and 1 mg Versed; Antibiotics were administered within 1 hour of the procedure. ANESTHESIA/SEDATION: Versed 1 mg  IV; Fentanyl drip Moderate Sedation Time:  11 minutes The patient was continuously monitored during the procedure by the interventional radiology nurse under my direct supervision. CONTRAST:  2m OMNIPAQUE IOHEXOL 300 MG/ML  SOLN FLUOROSCOPY TIME:  Fluoroscopy Time: 2 minutes 0 seconds (6 mGy). COMPLICATIONS: None immediate. PROCEDURE: Informed written consent was obtained from the patient after a thorough discussion of the procedural risks, benefits and alternatives. All questions were addressed. Maximal Sterile Barrier Technique was utilized including caps, mask, sterile gowns, sterile gloves, sterile drape, hand hygiene and skin antiseptic. A timeout was performed prior to the initiation of the procedure. Maximal barrier sterile technique utilized including caps, mask, sterile gowns, sterile gloves, large sterile drape, hand hygiene, and chlorhexadine skin prep. An angled catheter was advanced over a wire under fluoroscopic guidance through the nose, down the esophagus and into the body of the stomach. The stomach was then insufflated with several 100 ml of air. Fluoroscopy confirmed location of the gastric bubble, as well as inferior displacement of the barium stained colon. Under direct fluoroscopic guidance, a single T-tack was placed, and the anterior gastric wall drawn up against the anterior abdominal wall. Percutaneous access was then obtained into the mid gastric body with an 18 gauge sheath needle. Aspiration of air, and injection of contrast material under fluoroscopy confirmed needle placement. An Amplatz wire was advanced in the gastric body and the access needle exchanged for a 9-French vascular sheath. A snare device was advanced through the vascular sheath and an Amplatz wire advanced through the angled catheter. The Amplatz wire was successfully snared and this was pulled up through the esophagus and out the mouth. A 20-French KAlinda DoomsMIC-PEG tube was then connected to the snare and pulled  through the mouth, down the esophagus, into the stomach and out to the anterior abdominal wall. Hand injection of contrast material confirmed intragastric location. The T-tack retention suture was then cut. The pull through peg tube was then secured with the external bumper and capped. The patient will be observed for several hours with the newly placed tube on low wall suction to evaluate for any post procedure complication. The patient tolerated the procedure well, there is no immediate complication. IMPRESSION: Successful placement of a 20 French pull through gastrostomy tube. Electronically Signed   By: HJacqulynn CadetM.D.   On: 09/18/2018 14:24   Dg Chest Port 1 View  Result Date: 09/19/2018 CLINICAL DATA:  Acute respiratory failure EXAM: PORTABLE CHEST 1 VIEW COMPARISON:  09/16/2018, 09/13/2018 FINDINGS: Tracheostomy tube is in place, with distal tip about 5.7 cm superior to the carina. No focal airspace disease or effusion. Normal heart size. No pneumothorax. IMPRESSION: No active disease. Electronically Signed   By: KDonavan FoilM.D.   On: 09/19/2018 20:49        Scheduled Meds: . acetaminophen  500 mg Per Tube Q6H  . amLODipine  5 mg Per Tube Daily  . carvedilol  6.25 mg Per Tube BID WC  . chlorhexidine gluconate (MEDLINE KIT)  15 mL Mouth Rinse BID  . Chlorhexidine Gluconate Cloth  6 each Topical Q0600  . enoxaparin (LOVENOX) injection  40 mg Subcutaneous Q24H  . feeding supplement (PRO-STAT SUGAR FREE 64)  30 mL Per Tube TID  . folic acid  1 mg Per Tube Daily  .  insulin aspart  2-6 Units Subcutaneous Q4H  . magnesium hydroxide  15 mL Per Tube Daily  . mouth rinse  15 mL Mouth Rinse 10 times per day  . metoCLOPramide (REGLAN) injection  10 mg Intravenous Q6H  . nystatin  5 mL Mouth/Throat TID AC & HS  . oxyCODONE  10 mg Per Tube Q6H  . pantoprazole sodium  40 mg Per Tube Q24H  . pregabalin  25 mg Per Tube BID  . thiamine  100 mg Per Tube Daily   Continuous Infusions: .  sodium chloride    . feeding supplement (JEVITY 1.2 CAL)    . fentaNYL infusion INTRAVENOUS Stopped (09/20/18 0824)     LOS: 12 days    Time spent: 66mn    PDomenic Polite MD [facility] Page via [email or web address], password [email or web address] After 7PM please contact night-coverage  09/20/2018, 11:09 AM

## 2018-09-20 NOTE — Progress Notes (Signed)
Physical Therapy Treatment Patient Details Name: Micheal Carey MRN: [id number] DOB: 12-28-1950 Today's Date: 09/20/2018    History of Present Illness Pt is a 68 year old man admitted 09/08/18 after found down with PEA arrest, unknown down time. Pt intubated. +severe hypoxic encephalopathy, ETOH and opiates.    PT Comments    Pt with no response to any attempts to stimulate. Will follow pt 1x/wk to monitor for readiness for more therapy.    Follow Up Recommendations  LTACH;SNF     Equipment Recommendations  Other (comment)(To be determined at next venue)    Recommendations for Other Services       Precautions / Restrictions Precautions Precautions: Fall;Other (comment) Precaution Comments: trach/ventilator, rectal pouch Restrictions Weight Bearing Restrictions: No    Mobility  Bed Mobility Overal bed mobility: Needs Assistance             General bed mobility comments: Placed bed in chair position with no response from patient  Transfers                    Ambulation/Gait                 Stairs             Wheelchair Mobility    Modified Rankin (Stroke Patients Only)       Balance                                            Cognition Arousal/Alertness: Lethargic   Overall Cognitive Status: Impaired/Different from baseline                                 General Comments: Pt with eyes closed and no response to any stimuli except possible slight grimace with sternal rub      Exercises Other Exercises Other Exercises: Performed PROM to UE and LE's and neck.  No response to any.    General Comments        Pertinent Vitals/Pain Pain Assessment: Faces Faces Pain Scale: No hurt    Home Living                      Prior Function            PT Goals (current goals can now be found in the care plan section) Acute Rehab PT Goals Patient Stated Goal: unable to state Progress towards  PT goals: Not progressing toward goals - comment    Frequency    Min 2X/week      PT Plan Frequency needs to be updated    Co-evaluation              AM-PAC PT "6 Clicks" Mobility   Outcome Measure  Help needed turning from your back to your side while in a flat bed without using bedrails?: Total Help needed moving from lying on your back to sitting on the side of a flat bed without using bedrails?: Total Help needed moving to and from a bed to a chair (including a wheelchair)?: Total Help needed standing up from a chair using your arms (e.g., wheelchair or bedside chair)?: Total Help needed to walk in hospital room?: Total Help needed climbing 3-5 steps with a railing? : Total 6 Click Score: 6  End of Session   Activity Tolerance: Patient limited by lethargy Patient left: in bed;with call bell/phone within reach Nurse Communication: Mobility status PT Visit Diagnosis: Other abnormalities of gait and mobility (R26.89)     Time: 3817-7116 PT Time Calculation (min) (ACUTE ONLY): 11 min  Charges:  $Therapeutic Activity: 8-22 mins                     [place] Pager [phone] Office [place] 09/20/2018, 1:01 PM

## 2018-09-20 NOTE — Progress Notes (Signed)
CSW attempted to reach social work staff member at the [facility] in [place], [place] regarding this patient, a voicemail was left requesting a return call.  Madilyn Fireman, MSW, LCSW-A Clinical Social Worker [facility] [phone]

## 2018-09-20 NOTE — Progress Notes (Signed)
CCM Progress Note  S: Seen in f/u for anoxic brain injury.  TF started yesterday, has what appeared to be aspiration event yesterday evening and ventilator dyssynchrony.  Fentanyl drip increased and TF stopped.  O: Blood pressure (!) 130/112, pulse 99, temperature 99 F (37.2 C), temperature source Axillary, resp. rate 12, height 5\' 8"  (1.727 m), weight 64.1 kg, SpO2 100 %.  - Chronically ill man more sedated today - Left gaze deviation today (was R yesterday) - Trach in place - No respiratory effort on PS trial - Grimaces to painful stimuli - Abd tender but soft, PEG in place - Not following commands  A:  # PEA arrest suspected OD with resulting significant anoxic injury confirmed on imaging and exam # Vent dependence and dysphagia related to above s/p trach # Hx EtOH and opiate abuse # Fever, recurrent, possible aspiration, cultures benign to date, CXR unrevealing  P:  - Restart trickle TF through PEG, add reglan and milk of magnesia, slowly titrate up on TF to goal as tolerated - Titrate up oxycodone and wean off fentanyl gtt - Continue PTA lyrica - Working on placement, will be an issue - f/u cultures, if spikes again send sputum/blood/urine cultures and PCT - Will follow intermittently for trach wean although I think he is going to be vent-dependent ATC for foreseeable future  Erskine Emery MD

## 2018-09-21 ENCOUNTER — Inpatient Hospital Stay (HOSPITAL_COMMUNITY): Payer: Medicare Other

## 2018-09-21 LAB — CBC
HCT: 30.9 % — ABNORMAL LOW (ref 39.0–52.0)
Hemoglobin: 10.1 g/dL — ABNORMAL LOW (ref 13.0–17.0)
MCH: 28.4 pg (ref 26.0–34.0)
MCHC: 32.7 g/dL (ref 30.0–36.0)
MCV: 86.8 fL (ref 80.0–100.0)
Platelets: 316 10*3/uL (ref 150–400)
RBC: 3.56 MIL/uL — ABNORMAL LOW (ref 4.22–5.81)
RDW: 12.6 % (ref 11.5–15.5)
WBC: 13.3 10*3/uL — ABNORMAL HIGH (ref 4.0–10.5)
nRBC: 0 % (ref 0.0–0.2)

## 2018-09-21 LAB — COMPREHENSIVE METABOLIC PANEL
ALT: 55 U/L — ABNORMAL HIGH (ref 0–44)
AST: 62 U/L — ABNORMAL HIGH (ref 15–41)
Albumin: 2.6 g/dL — ABNORMAL LOW (ref 3.5–5.0)
Alkaline Phosphatase: 125 U/L (ref 38–126)
Anion gap: 9 (ref 5–15)
BUN: 23 mg/dL (ref 8–23)
CO2: 24 mmol/L (ref 22–32)
Calcium: 8.7 mg/dL — ABNORMAL LOW (ref 8.9–10.3)
Chloride: 106 mmol/L (ref 98–111)
Creatinine, Ser: 1.13 mg/dL (ref 0.61–1.24)
GFR calc Af Amer: >60 mL/min (ref >60)
GFR calc non Af Amer: >60 mL/min (ref >60)
Glucose, Bld: 127 mg/dL — ABNORMAL HIGH (ref 70–99)
Potassium: 3.7 mmol/L (ref 3.5–5.1)
Sodium: 139 mmol/L (ref 135–145)
Total Bilirubin: 1.3 mg/dL — ABNORMAL HIGH (ref 0.3–1.2)
Total Protein: 6.7 g/dL (ref 6.5–8.1)

## 2018-09-21 LAB — GLUCOSE, CAPILLARY
Glucose-Capillary: 102 mg/dL — ABNORMAL HIGH (ref 70–99)
Glucose-Capillary: 106 mg/dL — ABNORMAL HIGH (ref 70–99)
Glucose-Capillary: 107 mg/dL — ABNORMAL HIGH (ref 70–99)
Glucose-Capillary: 107 mg/dL — ABNORMAL HIGH (ref 70–99)
Glucose-Capillary: 108 mg/dL — ABNORMAL HIGH (ref 70–99)
Glucose-Capillary: 110 mg/dL — ABNORMAL HIGH (ref 70–99)

## 2018-09-21 NOTE — Progress Notes (Signed)
PROGRESS NOTE    Micheal Carey  QQP:[id number] DOB: 1950/08/24 DOA: 09/08/2018 PCP: [name], MD  Brief Narrative: PCCM transfer to [facility] 25/77 -68 year old male found in parking lot with PR PEA arrest, unknown downtime required mechanical ventilation, UDS was positive for opiates and alcohol level was 276. 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 -Could not be liberated from a ventilator subsequently was trached on 8/17 -s/p PEG tube on 8/19 -PCCM following and managing vent, sedation, weaning trials -Transferred to [facility] service 8/21, now off IV fentanyl  Assessment & Plan:   Acute hypoxic respiratory failure -From accidental opiate/alcohol overdose -Background of COPD -Admitted following PEA arrest with unknown downtime -Complicated by severe anoxic encephalopathy and persistent vegetative state -Failed to wean from ventilator due to anoxic encephalopathy, -Status post tracheostomy on 8/17 -Also treated for pneumococcal pneumonia -Continue to attempt pressure support trials as tolerated -Off fentanyl, restarted on Lyrica, oxycodone-per CCM  Cardiac arrest -This was a respiratory arrest from overdose -As above  Dysphagia -Status post PEG tube 8/19 -Had large volume emesis after restarting tube feeds again, will hold tube feeds today, place G-tube to suction if he has recurrent vomiting, resume tube feeds tomorrow at a low rate as tolerated, KUB  Severe anoxic encephalopathy -By neurology, earlier this admission EEG and MRI findings suggestive of severe anoxic brain injury -This was discussed with family, prognosis felt to be very poor -Repeat EEG yesterday 8/20 suggestive of severe encephalopathy as well  Pneumococcal pneumonia -Respiratory cultures grew few strep pneumo -blood cultures are negative and afebrile now -Initially treated with Zosyn from 8/12 to 8/13 and  then Rocephin from 8/15 to 8/17, off antibiotics now -Reculture if spikes again, last x-ray is unremarkable  Goals of care: -Per PCCM felt to have very poor prognosis with severe anoxic encephalopathy, unknown downtime following cardiac arrest, based on EEG, MRI and neurology recommendations Multiple discussions have been held with family, they want to proceed with full scope of treatment, will need vent SNF  Alcohol abuse -Out of window for withdrawal, maintained on thiamine  COPD -Stable, nebs PRN  DVT prophylaxis: Lovenox Code Status: Full code Family Communication: No family at bedside Disposition Plan: Will need SNF with vent versus LTAC  Consultants:   PCCM following   Procedures:   Antimicrobials:    Subjective: -Vomited tube feeds again last night, subsequently held -Tolerating meds via PEG -Remains on vent with 40% FiO2  Objective: Vitals:   09/21/18 1123 09/21/18 1140 09/21/18 1200 09/21/18 1300  BP:   (!) 143/89 (!) 107/92  Pulse:   94 81  Resp:  (!) '27 14 12  ' Temp: 98.3 F (36.8 C)     TempSrc: Axillary     SpO2:  98% 98% 99%  Weight:      Height:        Intake/Output Summary (Last 24 hours) at 09/21/2018 1347 Last data filed at 09/21/2018 0600 Gross per 24 hour  Intake 512.5 ml  Output 810 ml  Net -297.5 ml   Filed Weights   09/17/18 0500 09/18/18 0500 09/20/18 0445  Weight: 62.8 kg 64.1 kg 63.8 kg    Examination:  Gen: Obtunded, opens eyes, does not track or follow commands HEENT: Tracheostomy connected to ventilator Lungs: Few conducted upper airway sounds CVS: RRR,No Gallops,Rubs or new Murmurs Abd: Soft, non-distended, bowel sounds present Extremities: No edema Skin: no new rashes Psychiatry: Unable to assess  Data Reviewed:   CBC: Recent Labs  Lab 09/21/18 0418  WBC 13.3*  HGB 10.1*  HCT 30.9*  MCV 86.8  PLT 102   Basic Metabolic Panel: Recent Labs  Lab 09/16/18 0602 09/17/18 0440 09/18/18 0418 09/19/18 0257  09/21/18 0418  NA 154* 150* 146* 139 139  K 3.6 3.0* 3.5 3.8 3.7  CL 124* 121* 116* 109 106  CO2 '22 23 22 ' 21* 24  GLUCOSE 120* 120* 113* 121* 127*  BUN '15 14 12 9 23  ' CREATININE 0.80 0.90 0.88 0.92 1.13  CALCIUM 9.0 8.7* 8.5* 8.8* 8.7*  MG 2.1  --   --   --   --    GFR: Estimated Creatinine Clearance: 58.8 mL/min (by C-G formula based on SCr of 1.13 mg/dL). Liver Function Tests: Recent Labs  Lab 09/21/18 0418  AST 62*  ALT 55*  ALKPHOS 125  BILITOT 1.3*  PROT 6.7  ALBUMIN 2.6*   No results for input(s): LIPASE, AMYLASE in the last 168 hours. No results for input(s): AMMONIA in the last 168 hours. Coagulation Profile: Recent Labs  Lab 09/16/18 1237  INR 1.2   Cardiac Enzymes: No results for input(s): CKTOTAL, CKMB, CKMBINDEX, TROPONINI in the last 168 hours. BNP (last 3 results) No results for input(s): PROBNP in the last 8760 hours. HbA1C: No results for input(s): HGBA1C in the last 72 hours. CBG: Recent Labs  Lab 09/20/18 1950 09/20/18 2321 09/21/18 0326 09/21/18 0721 09/21/18 1122  GLUCAP 119* 120* 107* 102* 106*   Lipid Profile: No results for input(s): CHOL, HDL, LDLCALC, TRIG, CHOLHDL, LDLDIRECT in the last 72 hours. Thyroid Function Tests: No results for input(s): TSH, T4TOTAL, FREET4, T3FREE, THYROIDAB in the last 72 hours. Anemia Panel: No results for input(s): VITAMINB12, FOLATE, FERRITIN, TIBC, IRON, RETICCTPCT in the last 72 hours. Urine analysis:    Component Value Date/Time   COLORURINE YELLOW 09/08/2018 1641   APPEARANCEUR HAZY (A) 09/08/2018 1641   LABSPEC 1.017 09/08/2018 1641   PHURINE 6.0 09/08/2018 1641   GLUCOSEU 150 (A) 09/08/2018 1641   HGBUR SMALL (A) 09/08/2018 [place] 09/08/2018 1641   KETONESUR NEGATIVE 09/08/2018 1641   PROTEINUR 100 (A) 09/08/2018 1641   NITRITE NEGATIVE 09/08/2018 1641   LEUKOCYTESUR NEGATIVE 09/08/2018 1641   Sepsis Labs: '@LABRCNTIP' (procalcitonin:4,lacticidven:4)  ) Recent  Results (from the past 240 hour(s))  SARS Coronavirus 2 [facility] order, Performed in [place] hospital lab)     Status: None   Collection Time: 09/17/18  9:15 AM  Result Value Ref Range Status   SARS Coronavirus 2 NEGATIVE NEGATIVE Final    Comment: (NOTE) If result is NEGATIVE SARS-CoV-2 target nucleic acids are NOT DETECTED. The SARS-CoV-2 RNA is generally detectable in upper and lower  respiratory specimens during the acute phase of infection. The lowest  concentration of SARS-CoV-2 viral copies this assay can detect is 250  copies / mL. A negative result does not preclude SARS-CoV-2 infection  and should not be used as the sole basis for treatment or other  patient management decisions.  A negative result may occur with  improper specimen collection / handling, submission of specimen other  than nasopharyngeal swab, presence of viral mutation(s) within the  areas targeted by this assay, and inadequate number of viral copies  (<250 copies / mL). A negative result must be combined with clinical  observations, patient history, and epidemiological information. If result is POSITIVE SARS-CoV-2 target nucleic acids are DETECTED. The SARS-CoV-2 RNA is generally detectable in  upper and lower  respiratory specimens dur ing the acute phase of infection.  Positive  results are indicative of active infection with SARS-CoV-2.  Clinical  correlation with patient history and other diagnostic information is  necessary to determine patient infection status.  Positive results do  not rule out bacterial infection or co-infection with other viruses. If result is PRESUMPTIVE POSTIVE SARS-CoV-2 nucleic acids MAY BE PRESENT.   A presumptive positive result was obtained on the submitted specimen  and confirmed on repeat testing.  While 2019 novel coronavirus  (SARS-CoV-2) nucleic acids may be present in the submitted sample  additional confirmatory testing may be necessary for epidemiological  and  / or clinical management purposes  to differentiate between  SARS-CoV-2 and other Sarbecovirus currently known to infect humans.  If clinically indicated additional testing with an alternate test  methodology [phone]) is advised. The SARS-CoV-2 RNA is generally  detectable in upper and lower respiratory sp ecimens during the acute  phase of infection. The expected result is Negative. Fact Sheet for Patients:  StrictlyIdeas.no Fact Sheet for Healthcare Providers: BankingDealers.co.za This test is not yet approved or cleared by the [place] FDA and has been authorized for detection and/or diagnosis of SARS-CoV-2 by FDA under an Emergency Use Authorization (EUA).  This EUA will remain in effect (meaning this test can be used) for the duration of the COVID-19 declaration under Section 564(b)(1) of the Act, 21 U.S.C. section 360bbb-3(b)(1), unless the authorization is terminated or revoked sooner. Performed at [facility], [place] [address], [place], [place] [address]   Culture, blood (routine x 2)     Status: None (Preliminary result)   Collection Time: 09/18/18 10:29 PM   Specimen: BLOOD LEFT FOREARM  Result Value Ref Range Status   Specimen Description BLOOD LEFT FOREARM  Final   Special Requests   Final    BOTTLES DRAWN AEROBIC AND ANAEROBIC Blood Culture results may not be optimal due to an inadequate volume of blood received in culture bottles   Culture   Final    NO GROWTH 3 DAYS Performed at [facility], [place] [address], [place], [place] [address]    Report Status PENDING  Incomplete  Culture, blood (routine x 2)     Status: None (Preliminary result)   Collection Time: 09/18/18 10:33 PM   Specimen: BLOOD LEFT HAND  Result Value Ref Range Status   Specimen Description BLOOD LEFT HAND  Final   Special Requests   Final    BOTTLES DRAWN AEROBIC AND ANAEROBIC Blood Culture results may not be optimal due to an  inadequate volume of blood received in culture bottles   Culture   Final    NO GROWTH 3 DAYS Performed at [facility], [place] [address], [place], [place] [address]    Report Status PENDING  Incomplete         Radiology Studies: Dg Chest Port 1 View  Result Date: 09/19/2018 CLINICAL DATA:  Acute respiratory failure EXAM: PORTABLE CHEST 1 VIEW COMPARISON:  09/16/2018, 09/13/2018 FINDINGS: Tracheostomy tube is in place, with distal tip about 5.7 cm superior to the carina. No focal airspace disease or effusion. Normal heart size. No pneumothorax. IMPRESSION: No active disease. Electronically Signed   By: Donavan Foil M.D.   On: 09/19/2018 20:49        Scheduled Meds:  acetaminophen  500 mg Per Tube Q6H   amLODipine  5 mg Per Tube Daily   carvedilol  6.25 mg  Per Tube BID WC   chlorhexidine gluconate (MEDLINE KIT)  15 mL Mouth Rinse BID   Chlorhexidine Gluconate Cloth  6 each Topical Q0600   enoxaparin (LOVENOX) injection  40 mg Subcutaneous Q24H   feeding supplement (PRO-STAT SUGAR FREE 64)  30 mL Per Tube TID   folic acid  1 mg Per Tube Daily   insulin aspart  2-6 Units Subcutaneous Q4H   magnesium hydroxide  15 mL Per Tube Daily   mouth rinse  15 mL Mouth Rinse 10 times per day   metoCLOPramide (REGLAN) injection  10 mg Intravenous Q6H   nystatin  5 mL Mouth/Throat TID AC & HS   oxyCODONE  10 mg Per Tube Q6H   pantoprazole sodium  40 mg Per Tube Q24H   pregabalin  25 mg Per Tube BID   thiamine  100 mg Per Tube Daily   Continuous Infusions:  sodium chloride     feeding supplement (JEVITY 1.2 CAL) Stopped (09/21/18 0255)   fentaNYL infusion INTRAVENOUS Stopped (09/20/18 0824)     LOS: 13 days    Time spent: 66mn    PDomenic Polite MD [facility] Page via [email or web address], password [email or web address] After 7PM please contact night-coverage  09/21/2018, 1:47 PM

## 2018-09-22 LAB — GLUCOSE, CAPILLARY
Glucose-Capillary: 100 mg/dL — ABNORMAL HIGH (ref 70–99)
Glucose-Capillary: 107 mg/dL — ABNORMAL HIGH (ref 70–99)
Glucose-Capillary: 114 mg/dL — ABNORMAL HIGH (ref 70–99)
Glucose-Capillary: 115 mg/dL — ABNORMAL HIGH (ref 70–99)
Glucose-Capillary: 87 mg/dL (ref 70–99)
Glucose-Capillary: 89 mg/dL (ref 70–99)

## 2018-09-22 MED ORDER — METOCLOPRAMIDE HCL 5 MG/ML IJ SOLN
5.0000 mg | Freq: Four times a day (QID) | INTRAMUSCULAR | Status: DC
  Administered 2018-09-22 – 2018-09-26 (×17): 5 mg via INTRAVENOUS
  Filled 2018-09-22 (×17): qty 2

## 2018-09-22 NOTE — Progress Notes (Signed)
PROGRESS NOTE    Micheal Carey  BJS:[id number] DOB: January 19, 1951 DOA: 09/08/2018 PCP: [name], MD  Brief Narrative: PCCM transfer to [facility] 42/10 -68 year old male found in parking lot with PEA arrest, unknown downtime required mechanical ventilation, UDS was positive for opiates and alcohol level was 276. 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 -Could not be liberated from a ventilator subsequently was trached on 8/17 -s/p PEG tube on 8/19 -PCCM following and managing vent, sedation, weaning trials -Transferred to [facility] service 8/21, now off IV fentanyl  Assessment & Plan:   Acute hypoxic respiratory failure -From accidental opiate/alcohol overdose, in the background of COPD -Admitted following PEA arrest with unknown downtime -Complicated by severe anoxic encephalopathy and persistent vegetative state -Failed to wean from ventilator due to anoxic encephalopathy, -Status post tracheostomy on 8/17 -Also treated for pneumococcal pneumonia -Continue to attempt pressure support trials as tolerated -Sedation per CCM, continue oxycodone, Lyrica -Social work consult for long-term vent SNF  Cardiac arrest -This was a respiratory arrest from overdose -As above  Dysphagia -Status post PEG tube 8/19 -Multiple episodes of vomiting last night, NG has been clamped, will attempt trickle tube feeds today if no further vomiting, will not increase rate aggressively, 10 cc an hour, increase up to 20 cc an hour today -add reglan for prokinetic effects  Severe anoxic encephalopathy -By neurology, earlier this admission EEG and MRI findings suggestive of severe anoxic brain injury -This was discussed with family, prognosis felt to be very poor -Repeat EEG yesterday 8/20 suggestive of severe encephalopathy as well  Pneumococcal pneumonia -Respiratory cultures grew few strep pneumo -blood  cultures are negative and afebrile now -Initially treated with Zosyn from 8/12 to 8/13 and then Rocephin from 8/15 to 8/17, off antibiotics now -Reculture if spikes again, last x-ray is unremarkable  Goals of care: -Per PCCM felt to have very poor prognosis with severe anoxic encephalopathy, unknown downtime following cardiac arrest, based on EEG, MRI and neurology recommendations Multiple discussions have been held with family, they want to proceed with full scope of treatment, will need vent SNF or LTAC  Alcohol abuse -Out of window for withdrawal, maintained on thiamine  COPD -Stable, nebs PRN  DVT prophylaxis: Lovenox Code Status: Full code Family Communication: No family at bedside Disposition Plan: Will need SNF with vent versus LTAC  Consultants:   PCCM following   Procedures:   Antimicrobials:    Subjective: -Tolerated pressure support trials from several hours yesterday -Vomited again last night Objective: Vitals:   09/22/18 1000 09/22/18 1100 09/22/18 1105 09/22/18 1127  BP: 134/75 (!) 163/70    Pulse: 88 93  92  Resp: 15 18    Temp:   98.5 F (36.9 C)   TempSrc:   Axillary   SpO2: 100% 100%  100%  Weight:      Height:        Intake/Output Summary (Last 24 hours) at 09/22/2018 1144 Last data filed at 09/22/2018 0500 Gross per 24 hour  Intake 90 ml  Output 1075 ml  Net -985 ml   Filed Weights   09/18/18 0500 09/20/18 0445 09/22/18 0436  Weight: 64.1 kg 63.8 kg 63.2 kg    Examination:  Gen: Obtunded, opens eyes, does not track or follow commands HEENT: Tracheostomy connected to ventilator Lungs: Few conducted upper airway sounds  CVS: RRR,No Gallops,Rubs or new Murmurs Abd: soft, Non tender, non distended, BS present, PEG tube  noted Extremities: No edema Skin: no new rashes Psychiatry: Unable to assess    Data Reviewed:   CBC: Recent Labs  Lab 09/21/18 0418  WBC 13.3*  HGB 10.1*  HCT 30.9*  MCV 86.8  PLT 721   Basic Metabolic  Panel: Recent Labs  Lab 09/16/18 0602 09/17/18 0440 09/18/18 0418 09/19/18 0257 09/21/18 0418  NA 154* 150* 146* 139 139  K 3.6 3.0* 3.5 3.8 3.7  CL 124* 121* 116* 109 106  CO2 '22 23 22 ' 21* 24  GLUCOSE 120* 120* 113* 121* 127*  BUN '15 14 12 9 23  ' CREATININE 0.80 0.90 0.88 0.92 1.13  CALCIUM 9.0 8.7* 8.5* 8.8* 8.7*  MG 2.1  --   --   --   --    GFR: Estimated Creatinine Clearance: 58.3 mL/min (by C-G formula based on SCr of 1.13 mg/dL). Liver Function Tests: Recent Labs  Lab 09/21/18 0418  AST 62*  ALT 55*  ALKPHOS 125  BILITOT 1.3*  PROT 6.7  ALBUMIN 2.6*   No results for input(s): LIPASE, AMYLASE in the last 168 hours. No results for input(s): AMMONIA in the last 168 hours. Coagulation Profile: Recent Labs  Lab 09/16/18 1237  INR 1.2   Cardiac Enzymes: No results for input(s): CKTOTAL, CKMB, CKMBINDEX, TROPONINI in the last 168 hours. BNP (last 3 results) No results for input(s): PROBNP in the last 8760 hours. HbA1C: No results for input(s): HGBA1C in the last 72 hours. CBG: Recent Labs  Lab 09/21/18 1943 09/21/18 2351 09/22/18 0347 09/22/18 0711 09/22/18 1104  GLUCAP 107* 110* 115* 89 107*   Lipid Profile: No results for input(s): CHOL, HDL, LDLCALC, TRIG, CHOLHDL, LDLDIRECT in the last 72 hours. Thyroid Function Tests: No results for input(s): TSH, T4TOTAL, FREET4, T3FREE, THYROIDAB in the last 72 hours. Anemia Panel: No results for input(s): VITAMINB12, FOLATE, FERRITIN, TIBC, IRON, RETICCTPCT in the last 72 hours. Urine analysis:    Component Value Date/Time   COLORURINE YELLOW 09/08/2018 1641   APPEARANCEUR HAZY (A) 09/08/2018 1641   LABSPEC 1.017 09/08/2018 1641   PHURINE 6.0 09/08/2018 1641   GLUCOSEU 150 (A) 09/08/2018 1641   HGBUR SMALL (A) 09/08/2018 [place] 09/08/2018 1641   KETONESUR NEGATIVE 09/08/2018 1641   PROTEINUR 100 (A) 09/08/2018 1641   NITRITE NEGATIVE 09/08/2018 1641   LEUKOCYTESUR NEGATIVE  09/08/2018 1641   Sepsis Labs: '@LABRCNTIP' (procalcitonin:4,lacticidven:4)  ) Recent Results (from the past 240 hour(s))  SARS Coronavirus 2 [facility] order, Performed in [place] hospital lab)     Status: None   Collection Time: 09/17/18  9:15 AM  Result Value Ref Range Status   SARS Coronavirus 2 NEGATIVE NEGATIVE Final    Comment: (NOTE) If result is NEGATIVE SARS-CoV-2 target nucleic acids are NOT DETECTED. The SARS-CoV-2 RNA is generally detectable in upper and lower  respiratory specimens during the acute phase of infection. The lowest  concentration of SARS-CoV-2 viral copies this assay can detect is 250  copies / mL. A negative result does not preclude SARS-CoV-2 infection  and should not be used as the sole basis for treatment or other  patient management decisions.  A negative result may occur with  improper specimen collection / handling, submission of specimen other  than nasopharyngeal swab, presence of viral mutation(s) within the  areas targeted by this assay, and inadequate number of viral copies  (<250 copies / mL). A negative result must be combined with clinical  observations, patient history, and epidemiological information. If result  is POSITIVE SARS-CoV-2 target nucleic acids are DETECTED. The SARS-CoV-2 RNA is generally detectable in upper and lower  respiratory specimens dur ing the acute phase of infection.  Positive  results are indicative of active infection with SARS-CoV-2.  Clinical  correlation with patient history and other diagnostic information is  necessary to determine patient infection status.  Positive results do  not rule out bacterial infection or co-infection with other viruses. If result is PRESUMPTIVE POSTIVE SARS-CoV-2 nucleic acids MAY BE PRESENT.   A presumptive positive result was obtained on the submitted specimen  and confirmed on repeat testing.  While 2019 novel coronavirus  (SARS-CoV-2) nucleic acids may be present in the  submitted sample  additional confirmatory testing may be necessary for epidemiological  and / or clinical management purposes  to differentiate between  SARS-CoV-2 and other Sarbecovirus currently known to infect humans.  If clinically indicated additional testing with an alternate test  methodology [phone]) is advised. The SARS-CoV-2 RNA is generally  detectable in upper and lower respiratory sp ecimens during the acute  phase of infection. The expected result is Negative. Fact Sheet for Patients:  StrictlyIdeas.no Fact Sheet for Healthcare Providers: BankingDealers.co.za This test is not yet approved or cleared by the [place] FDA and has been authorized for detection and/or diagnosis of SARS-CoV-2 by FDA under an Emergency Use Authorization (EUA).  This EUA will remain in effect (meaning this test can be used) for the duration of the COVID-19 declaration under Section 564(b)(1) of the Act, 21 U.S.C. section 360bbb-3(b)(1), unless the authorization is terminated or revoked sooner. Performed at [facility], [place] [address] [place], [place] [address]   Culture, blood (routine x 2)     Status: None (Preliminary result)   Collection Time: 09/18/18 10:29 PM   Specimen: BLOOD LEFT FOREARM  Result Value Ref Range Status   Specimen Description BLOOD LEFT FOREARM  Final   Special Requests   Final    BOTTLES DRAWN AEROBIC AND ANAEROBIC Blood Culture results may not be optimal due to an inadequate volume of blood received in culture bottles   Culture   Final    NO GROWTH 4 DAYS Performed at [facility], [place] [address], [place], [place] [address]    Report Status PENDING  Incomplete  Culture, blood (routine x 2)     Status: None (Preliminary result)   Collection Time: 09/18/18 10:33 PM   Specimen: BLOOD LEFT HAND  Result Value Ref Range Status   Specimen Description BLOOD LEFT HAND  Final   Special Requests   Final     BOTTLES DRAWN AEROBIC AND ANAEROBIC Blood Culture results may not be optimal due to an inadequate volume of blood received in culture bottles   Culture   Final    NO GROWTH 4 DAYS Performed at [facility], [place] [address] [place], [place] [address]    Report Status PENDING  Incomplete         Radiology Studies: Dg Abd Portable 1v  Result Date: 09/21/2018 CLINICAL DATA:  Nausea and vomiting. EXAM: PORTABLE ABDOMEN - 1 VIEW COMPARISON:  CT of the abdomen and pelvis on 09/16/2018 FINDINGS: Patient has a gastrostomy tube. Bowel gas pattern is nonobstructive. No evidence for free intraperitoneal air. Remote RIGHT hip arthroplasty. IMPRESSION: Negative. Electronically Signed   By: Nolon Nations M.D.   On: 09/21/2018 13:46        Scheduled Meds:  acetaminophen  500 mg Per Tube Q6H  amLODipine  5 mg Per Tube Daily   carvedilol  6.25 mg Per Tube BID WC   chlorhexidine gluconate (MEDLINE KIT)  15 mL Mouth Rinse BID   Chlorhexidine Gluconate Cloth  6 each Topical Q0600   enoxaparin (LOVENOX) injection  40 mg Subcutaneous Q24H   feeding supplement (PRO-STAT SUGAR FREE 64)  30 mL Per Tube TID   folic acid  1 mg Per Tube Daily   insulin aspart  2-6 Units Subcutaneous Q4H   magnesium hydroxide  15 mL Per Tube Daily   mouth rinse  15 mL Mouth Rinse 10 times per day   nystatin  5 mL Mouth/Throat TID AC & HS   oxyCODONE  10 mg Per Tube Q6H   pantoprazole sodium  40 mg Per Tube Q24H   pregabalin  25 mg Per Tube BID   thiamine  100 mg Per Tube Daily   Continuous Infusions:  sodium chloride     feeding supplement (JEVITY 1.2 CAL) Stopped (09/21/18 0255)   fentaNYL infusion INTRAVENOUS Stopped (09/20/18 0824)     LOS: 14 days    Time spent: 69mn    PDomenic Polite MD [facility] Page via [email or web address], password [email or web address] After 7PM please contact night-coverage  09/22/2018, 11:44 AM

## 2018-09-23 DIAGNOSIS — E440 Moderate protein-calorie malnutrition: Secondary | ICD-10-CM

## 2018-09-23 LAB — CBC
HCT: 33.9 % — ABNORMAL LOW (ref 39.0–52.0)
Hemoglobin: 10.9 g/dL — ABNORMAL LOW (ref 13.0–17.0)
MCH: 28.1 pg (ref 26.0–34.0)
MCHC: 32.2 g/dL (ref 30.0–36.0)
MCV: 87.4 fL (ref 80.0–100.0)
Platelets: 360 10*3/uL (ref 150–400)
RBC: 3.88 MIL/uL — ABNORMAL LOW (ref 4.22–5.81)
RDW: 12.5 % (ref 11.5–15.5)
WBC: 9.4 10*3/uL (ref 4.0–10.5)
nRBC: 0 % (ref 0.0–0.2)

## 2018-09-23 LAB — BASIC METABOLIC PANEL
Anion gap: 13 (ref 5–15)
BUN: 20 mg/dL (ref 8–23)
CO2: 23 mmol/L (ref 22–32)
Calcium: 8.9 mg/dL (ref 8.9–10.3)
Chloride: 109 mmol/L (ref 98–111)
Creatinine, Ser: 1.21 mg/dL (ref 0.61–1.24)
GFR calc Af Amer: >60 mL/min (ref >60)
GFR calc non Af Amer: >60 mL/min (ref >60)
Glucose, Bld: 102 mg/dL — ABNORMAL HIGH (ref 70–99)
Potassium: 3.7 mmol/L (ref 3.5–5.1)
Sodium: 145 mmol/L (ref 135–145)

## 2018-09-23 LAB — CULTURE, BLOOD (ROUTINE X 2)
Culture: NO GROWTH
Culture: NO GROWTH

## 2018-09-23 LAB — GLUCOSE, CAPILLARY
Glucose-Capillary: 100 mg/dL — ABNORMAL HIGH (ref 70–99)
Glucose-Capillary: 101 mg/dL — ABNORMAL HIGH (ref 70–99)
Glucose-Capillary: 86 mg/dL (ref 70–99)
Glucose-Capillary: 94 mg/dL (ref 70–99)
Glucose-Capillary: 97 mg/dL (ref 70–99)

## 2018-09-23 MED ORDER — CLONAZEPAM 0.1 MG/ML ORAL SUSPENSION: 2.0000 mg | Freq: Three times a day (TID) | ORAL | Status: DC

## 2018-09-23 MED ORDER — CLONAZEPAM 0.5 MG PO TABS
1.0000 mg | ORAL_TABLET | Freq: Three times a day (TID) | ORAL | Status: DC
  Administered 2018-09-23 – 2018-10-17 (×72): 1 mg
  Filled 2018-09-23 (×73): qty 2

## 2018-09-23 MED ORDER — QUETIAPINE FUMARATE 50 MG PO TABS
25.0000 mg | ORAL_TABLET | Freq: Two times a day (BID) | ORAL | Status: DC
  Administered 2018-09-23 – 2018-09-24 (×3): 25 mg via ORAL
  Filled 2018-09-23 (×3): qty 1

## 2018-09-23 NOTE — Progress Notes (Signed)
NAME:  Micheal Carey, MRN:  [id number], DOB:  [birth year], LOS: 15 ADMISSION DATE:  09/08/2018, CONSULTATION DATE:  09/08/2018 REFERRING MD:  Dr. Silverio LayYao, ER, CHIEF COMPLAINT:  AMS   Brief History   68 yo male found in parking lot with PEA arrest with unknown downtime.  Required intubation.  UDS positive for opiates and ETOH 276.  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  Past Medical History  COPD, ETOH, opiate abuse  Significant Hospital Events   8/09 Admit 8/11 Fever 102.7  Consults:  Neurology  Procedures:  ETT 8/09 >>  Significant Diagnostic Tests:  Echo 8/10 >> EF 60 to 65%, mild MR EEG 8/12 >> profound, severe encephalopathy MRI brain 8/12 >> hypoxic/ischemic injury, moderate chronic small vessel ischemic disease  Micro Data:  COVID 8/09 >> negative Blood 8/09 >> negative Blood 8/12 >>NTD Sputum 8/12 >> Strep pneumoniae  Antimicrobials:  Vancomycin 8/11  Zosyn 8/11 >> 8/13 Rocephin 8/15 >> 8/17  Interim history/subjective:  Severe agitation overnight Failing weaning this AM Precedex drip started  Objective   Blood pressure 93/60, pulse 84, temperature 98.1 F (36.7 C), temperature source Axillary, resp. rate 13, height 5\' 8"  (1.727 m), weight 63.2 kg, SpO2 97 %.    Vent Mode: PRVC FiO2 (%):  [30 %] 30 % Set Rate:  [12 bmp] 12 bmp Vt Set:  [560 mL] 560 mL PEEP:  [5 cmH20] 5 cmH20 Plateau Pressure:  [15 cmH20-22 cmH20] 19 cmH20   Intake/Output Summary (Last 24 hours) at 09/23/2018 0905 Last data filed at 09/23/2018 0300 Gross per 24 hour  Intake -  Output 995 ml  Net -995 ml   Filed Weights   09/18/18 0500 09/20/18 0445 09/22/18 0436  Weight: 64.1 kg 63.8 kg 63.2 kg   Examination:  General - Chronically ill appearing male, NAD HEENT - [place]/AT, PERRL, EOM-I and MMM, trach in place Cardiac - RRR, Nl S1/S2 and -M/R/G Chest - Coarse BS diffusely Abdomen - Soft, NT, ND and +BS Extremities - -edema and -tenderness Skin - no  rashes Neuro - doesn't follow commands  I reviewed CXR myself, trach is in a good position  Resolved Hospital Problem list     Assessment & Plan:   Acute hypoxic respiratory failure from accidental opiate/ETOH overdose. Hx of COPD. Failure to wean from ventilator due to mental status. - Reattempt weaning on PS trials, no TC today - F/u CXR intermittently - PRN BDs - Titrate O2 for sat of 88-92%  Pneumococcal pneumonia. - Abx course completely observe WBC and fever curve  Acute anoxic encephalopathy with persistent vegetative state. - RASS goal 0 - D/C precedex and fentanyl drip - Add clonazepam 1 mg TID - Add Seroquel 25 BID QTc 0.36 - Oxy at 10 mg PO q6, if remains agitated then will increase  PEA cardiac arrest after opiate/ETOH induced respiratory failure. - Monitor hemodynamics  Hypernatremia. Hypokalemia. - F/u BMET - Increased free water to 200 ml q8h - KVO IVF - BMET in AM - Replace electrolytes as indicated  Moderate protein calorie malnutrition. Vomiting episode 8/15. - Fube feeds - PEG today - PRN zofran  Thrush. - Nystatin as ordered stop after 8/25 dose  Best practice:  Diet: tube feeds DVT prophylaxis: lovenox GI prophylaxis: protonix Mobility: bed rest Code Status: full code Disposition: ICU  Labs    CMP Latest Ref Rng & Units 09/23/2018 09/21/2018 09/19/2018  Glucose 70 - 99 mg/dL 409(W102(H) 119(J127(H) 478(G121(H)  BUN 8 - 23 mg/dL 20  23 9  Creatinine 0.61 - 1.24 mg/dL 1.21 1.13 0.92  Sodium 135 - 145 mmol/L 145 139 139  Potassium 3.5 - 5.1 mmol/L 3.7 3.7 3.8  Chloride 98 - 111 mmol/L 109 106 109  CO2 22 - 32 mmol/L 23 24 21(L)  Calcium 8.9 - 10.3 mg/dL 8.9 8.7(L) 8.8(L)  Total Protein 6.5 - 8.1 g/dL - 6.7 -  Total Bilirubin 0.3 - 1.2 mg/dL - 1.3(H) -  Alkaline Phos 38 - 126 U/L - 125 -  AST 15 - 41 U/L - 62(H) -  ALT 0 - 44 U/L - 55(H) -   CBC Latest Ref Rng & Units 09/23/2018 09/21/2018 09/14/2018  WBC 4.0 - 10.5 K/uL 9.4 13.3(H) 8.5   Hemoglobin 13.0 - 17.0 g/dL 10.9(L) 10.1(L) 13.1  Hematocrit 39.0 - 52.0 % 33.9(L) 30.9(L) 40.2  Platelets 150 - 400 K/uL 360 316 217   CBG (last 3)  Recent Labs    09/22/18 2340 09/23/18 0333 09/23/18 0719  GLUCAP 100* 100* 101*   ABG    Component Value Date/Time   PHART 7.506 (H) 09/10/2018 0359   PCO2ART 32.5 09/10/2018 0359   PO2ART 177.0 (H) 09/10/2018 0359   HCO3 25.7 09/10/2018 0359   TCO2 27 09/10/2018 0359   ACIDBASEDEF 1.0 09/09/2018 0427   O2SAT 100.0 09/10/2018 0359   The patient is critically ill with multiple organ systems failure and requires high complexity decision making for assessment and support, frequent evaluation and titration of therapies, application of advanced monitoring technologies and extensive interpretation of multiple databases.   Critical Care Time devoted to patient care services described in this note is  32  Minutes. This time reflects time of care of this signee Dr Jennet Maduro. This critical care time does not reflect procedure time, or teaching time or supervisory time of PA/NP/Med student/Med Resident etc but could involve care discussion time.  Rush Farmer, M.D. [facility] Pulmonary/Critical Care Medicine. Pager: [phone]. After hours pager: [phone].

## 2018-09-23 NOTE — Progress Notes (Signed)
Occupational Therapy Treatment Patient Details Name: Micheal Carey MRN: [id number] DOB: [birth year] Today's Date: 09/23/2018    History of present illness Pt is a 68 year old man admitted 09/08/18 after found down with PEA arrest, unknown down time. Pt intubated. +severe hypoxic encephalopathy, ETOH and opiates.   OT comments  Pt with increased flexor tone B UEs and maintaining head turned to R. Performed B UE PROM and gentle neck ROM and positioned with pillows to reduce tone. Pt with eyes closed yet restless during session. Continues to grimace to pain. No issues noted with hand splints per RN, skin intact.   Follow Up Recommendations  SNF;LTACH;Supervision/Assistance - 24 hour    Equipment Recommendations       Recommendations for Other Services      Precautions / Restrictions Precautions Precautions: Fall;Other (comment) Precaution Comments: trach/ventilator, rectal pouch Required Braces or Orthoses: Other Brace Other Brace: no difficulties noted with B resting hand splints or reported by RN       Mobility Bed Mobility               General bed mobility comments: positioned with head in neutral and slight flexion with pillows between trunk and UEs to facilitate extension  Transfers                      Balance                                           ADL either performed or assessed with clinical judgement   ADL                                               Vision       Perception     Praxis      Cognition Arousal/Alertness: Lethargic Behavior During Therapy: Flat affect                                   General Comments: pt with eyes closed throughout session, only responding with grimacing        Exercises Exercises: General Upper Extremity General Exercises - Upper Extremity Shoulder Flexion: PROM;Both;10 reps;Supine Shoulder ABduction: PROM;Both;10 reps;Supine Shoulder Horizontal  ABduction: PROM;Both;10 reps;Supine Shoulder Horizontal ADduction: PROM;Both;10 reps;Supine Elbow Flexion: PROM;Both;10 reps;Supine Elbow Extension: PROM;Both;10 reps;Supine Wrist Flexion: PROM;Both;10 reps;Supine Wrist Extension: PROM;Both;10 reps;Supine Composite Extension: PROM;Both;5 reps;Supine Other Exercises Other Exercises: gentle neck ROM   Shoulder Instructions       General Comments      Pertinent Vitals/ Pain       Pain Assessment: Faces Faces Pain Scale: Hurts little more Pain Location: neck, L shoulder Pain Descriptors / Indicators: Grimacing Pain Intervention(s): Monitored during session;Repositioned  Home Living                                          Prior Functioning/Environment              Frequency  Min 1X/week        Progress Toward Goals  OT Goals(current goals can now be found in  the care plan section)  Progress towards OT goals: Progressing toward goals  Acute Rehab OT Goals Patient Stated Goal: unable to state OT Goal Formulation: Patient unable to participate in goal setting Time For Goal Achievement: 09/30/18 Potential to Achieve Goals: Good  Plan Discharge plan remains appropriate    Co-evaluation                 AM-PAC OT "6 Clicks" Daily Activity     Outcome Measure   Help from another person eating meals?: Total Help from another person taking care of personal grooming?: Total Help from another person toileting, which includes using toliet, bedpan, or urinal?: Total Help from another person bathing (including washing, rinsing, drying)?: Total Help from another person to put on and taking off regular upper body clothing?: Total Help from another person to put on and taking off regular lower body clothing?: Total 6 Click Score: 6    End of Session    OT Visit Diagnosis: Muscle weakness (generalized) (M62.81)   Activity Tolerance Patient tolerated treatment well   Patient Left in bed;with  call bell/phone within reach;with bed alarm set   Nurse Communication Other (comment)(no issues with splints)        Time: [id number] OT Time Calculation (min): 21 min  Charges: OT General Charges $OT Visit: 1 Visit OT Treatments $Therapeutic Exercise: 8-22 mins  Micheal RoundJulie Batya Carey, OTR/L Acute Rehabilitation Services Pager: [phone] Office: [phone] Micheal Carey, Micheal Carey 09/23/2018, 1:49 PM

## 2018-09-23 NOTE — Progress Notes (Addendum)
PROGRESS NOTE    Micheal Carey  LEX:[id number] DOB: May 19, 1950 DOA: 09/08/2018 PCP: [name], MD  Brief Narrative: PCCM transfer to [facility] 44/75 -68 year old male found in parking lot with PEA arrest, unknown downtime required mechanical ventilation, UDS was positive for opiates and alcohol level was 276. 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 -Could not be liberated from a ventilator subsequently was trached on 8/17 -s/p PEG tube on 8/19 -PCCM following and managing vent, sedation, weaning trials -Transferred to [facility] service 8/21, now off IV fentanyl -Hospital course complicated by agitation and intermittent vomiting  Assessment & Plan:   Acute hypoxic respiratory failure -From accidental opiate/alcohol overdose, in the background of COPD -Admitted following PEA arrest with unknown downtime -Complicated by severe anoxic encephalopathy and persistent vegetative state -Failed to wean from ventilator due to anoxic encephalopathy, -Status post tracheostomy on 8/17 -Also treated for pneumococcal pneumonia -Continue to attempt pressure support trials as tolerated -Sedation per CCM, continue oxycodone, Lyrica, off fentanyl, started Seroquel and clonidine today -Social work consult for long-term vent SNF  Cardiac arrest -This was a respiratory arrest from overdose -As above  Dysphagia -Status post PEG tube 8/19 -Had a few episodes of vomiting this weekend, NG tube has been clamped,  -Attempt trickle tube feeds today  -Afebrile, no evidence of ileus on KUB  Severe anoxic encephalopathy -By neurology, earlier this admission EEG and MRI findings suggestive of severe anoxic brain injury -This was discussed with family, prognosis felt to be very poor -Repeat EEG yesterday 8/20 suggestive of severe encephalopathy as well  Pneumococcal pneumonia -Respiratory cultures grew few  strep pneumo -blood cultures are negative and afebrile now -Initially treated with Zosyn from 8/12 to 8/13 and then Rocephin from 8/15 to 8/17, off antibiotics now -Reculture if spikes again, last x-ray is unremarkable  Goals of care: -Per PCCM felt to have very poor prognosis with severe anoxic encephalopathy, unknown downtime following cardiac arrest, based on EEG, MRI and neurology recommendations Multiple discussions have been held with family, they want to proceed with full scope of treatment, will need vent SNF or LTAC -Social work consulted  Alcohol abuse -Out of window for withdrawal, maintained on thiamine  COPD -Stable, nebs PRN  DVT prophylaxis: Lovenox Code Status: Full code Family Communication: No family at bedside Disposition Plan: Will need SNF with vent versus LTAC  Consultants:   PCCM following   Procedures:   Antimicrobials:    Subjective: -Vomited yesterday evening, slightly increased agitation, IV fentanyl not helping much per RN -Afebrile and otherwise stable  Objective: Vitals:   09/23/18 1000 09/23/18 1100 09/23/18 1121 09/23/18 1158  BP: (!) 130/94 91/65  91/65  Pulse: 94 85  83  Resp: _0 Temp:   98.6 F (37 C)   TempSrc:   Axillary   SpO2: 98% 99%  98%  Weight:      Height:        Intake/Output Summary (Last 24 hours) at 09/23/2018 1324 Last data filed at 09/23/2018 1100 Gross per 24 hour  Intake 25.5 ml  Output 995 ml  Net -969.5 ml   Filed Weights   09/18/18 0500 09/20/18 0445 09/22/18 0436  Weight: 64.1 kg 63.8 kg 63.2 kg    Examination:  Gen: Eyes open, does not track or follow commands HEENT: Tracheostomy connected to ventilator Lungs: Good air movement, few scattered rhonchi CVS: RRR,No Gallops,Rubs or new Murmurs Abd: Soft  nontender, nondistended, PEG tube noted, bowel sounds decreased Extremities: No edema Skin: no new rashes psychiatry: Unable to assess    Data Reviewed:   CBC: Recent Labs  Lab  09/21/18 0418 09/23/18 0417  WBC 13.3* 9.4  HGB 10.1* 10.9*  HCT 30.9* 33.9*  MCV 86.8 87.4  PLT 316 616   Basic Metabolic Panel: Recent Labs  Lab 09/17/18 0440 09/18/18 0418 09/19/18 0257 09/21/18 0418 09/23/18 0417  NA 150* 146* 139 139 145  K 3.0* 3.5 3.8 3.7 3.7  CL 121* 116* 109 106 109  CO2 23 22 21* 24 23  GLUCOSE 120* 113* 121* 127* 102*  BUN _0 CREATININE 0.90 0.88 0.92 1.13 1.21  CALCIUM 8.7* 8.5* 8.8* 8.7* 8.9   GFR: Estimated Creatinine Clearance: 54.4 mL/min (by C-G formula based on SCr of 1.21 mg/dL). Liver Function Tests: Recent Labs  Lab 09/21/18 0418  AST 62*  ALT 55*  ALKPHOS 125  BILITOT 1.3*  PROT 6.7  ALBUMIN 2.6*   No results for input(s): LIPASE, AMYLASE in the last 168 hours. No results for input(s): AMMONIA in the last 168 hours. Coagulation Profile: No results for input(s): INR, PROTIME in the last 168 hours. Cardiac Enzymes: No results for input(s): CKTOTAL, CKMB, CKMBINDEX, TROPONINI in the last 168 hours. BNP (last 3 results) No results for input(s): PROBNP in the last 8760 hours. HbA1C: No results for input(s): HGBA1C in the last 72 hours. CBG: Recent Labs  Lab 09/22/18 1926 09/22/18 2340 09/23/18 0333 09/23/18 0719 09/23/18 1118  GLUCAP 87 100* 100* 101* 86   Lipid Profile: No results for input(s): CHOL, HDL, LDLCALC, TRIG, CHOLHDL, LDLDIRECT in the last 72 hours. Thyroid Function Tests: No results for input(s): TSH, T4TOTAL, FREET4, T3FREE, THYROIDAB in the last 72 hours. Anemia Panel: No results for input(s): VITAMINB12, FOLATE, FERRITIN, TIBC, IRON, RETICCTPCT in the last 72 hours. Urine analysis:    Component Value Date/Time   COLORURINE YELLOW 09/08/2018 1641   APPEARANCEUR HAZY (A) 09/08/2018 1641   LABSPEC 1.017 09/08/2018 1641   PHURINE 6.0 09/08/2018 1641   GLUCOSEU 150 (A) 09/08/2018 1641   HGBUR SMALL (A) 09/08/2018 [place] 09/08/2018 1641   KETONESUR NEGATIVE  09/08/2018 1641   PROTEINUR 100 (A) 09/08/2018 1641   NITRITE NEGATIVE 09/08/2018 1641   LEUKOCYTESUR NEGATIVE 09/08/2018 1641   Sepsis Labs: _1 (procalcitonin:4,lacticidven:4)  ) Recent Results (from the past 240 hour(s))  SARS Coronavirus 2 [facility] order, Performed in [place] hospital lab)     Status: None   Collection Time: 09/17/18  9:15 AM  Result Value Ref Range Status   SARS Coronavirus 2 NEGATIVE NEGATIVE Final    Comment: (NOTE) If result is NEGATIVE SARS-CoV-2 target nucleic acids are NOT DETECTED. The SARS-CoV-2 RNA is generally detectable in upper and lower  respiratory specimens during the acute phase of infection. The lowest  concentration of SARS-CoV-2 viral copies this assay can detect is 250  copies / mL. A negative result does not preclude SARS-CoV-2 infection  and should not be used as the sole basis for treatment or other  patient management decisions.  A negative result may occur with  improper specimen collection / handling, submission of specimen other  than nasopharyngeal swab, presence of viral mutation(s) within the  areas targeted by this assay, and inadequate number of viral copies  (<250 copies / mL). A negative result must be combined with clinical  observations, patient history, and epidemiological information. If result is  POSITIVE SARS-CoV-2 target nucleic acids are DETECTED. The SARS-CoV-2 RNA is generally detectable in upper and lower  respiratory specimens dur ing the acute phase of infection.  Positive  results are indicative of active infection with SARS-CoV-2.  Clinical  correlation with patient history and other diagnostic information is  necessary to determine patient infection status.  Positive results do  not rule out bacterial infection or co-infection with other viruses. If result is PRESUMPTIVE POSTIVE SARS-CoV-2 nucleic acids MAY BE PRESENT.   A presumptive positive result was obtained on the submitted specimen  and  confirmed on repeat testing.  While 2019 novel coronavirus  (SARS-CoV-2) nucleic acids may be present in the submitted sample  additional confirmatory testing may be necessary for epidemiological  and / or clinical management purposes  to differentiate between  SARS-CoV-2 and other Sarbecovirus currently known to infect humans.  If clinically indicated additional testing with an alternate test  methodology [phone]) is advised. The SARS-CoV-2 RNA is generally  detectable in upper and lower respiratory sp ecimens during the acute  phase of infection. The expected result is Negative. Fact Sheet for Patients:  StrictlyIdeas.no Fact Sheet for Healthcare Providers: BankingDealers.co.za This test is not yet approved or cleared by the [place] FDA and has been authorized for detection and/or diagnosis of SARS-CoV-2 by FDA under an Emergency Use Authorization (EUA).  This EUA will remain in effect (meaning this test can be used) for the duration of the COVID-19 declaration under Section 564(b)(1) of the Act, 21 U.S.C. section 360bbb-3(b)(1), unless the authorization is terminated or revoked sooner. Performed at [facility], [place] [address], [place], [place] [address]   Culture, blood (routine x 2)     Status: None   Collection Time: 09/18/18 10:29 PM   Specimen: BLOOD LEFT FOREARM  Result Value Ref Range Status   Specimen Description BLOOD LEFT FOREARM  Final   Special Requests   Final    BOTTLES DRAWN AEROBIC AND ANAEROBIC Blood Culture results may not be optimal due to an inadequate volume of blood received in culture bottles   Culture   Final    NO GROWTH 5 DAYS Performed at [facility], [place] [address], [place], [place] [address]    Report Status 09/23/2018 FINAL  Final  Culture, blood (routine x 2)     Status: None   Collection Time: 09/18/18 10:33 PM   Specimen: BLOOD LEFT HAND  Result Value Ref Range Status    Specimen Description BLOOD LEFT HAND  Final   Special Requests   Final    BOTTLES DRAWN AEROBIC AND ANAEROBIC Blood Culture results may not be optimal due to an inadequate volume of blood received in culture bottles   Culture   Final    NO GROWTH 5 DAYS Performed at [facility], [place] [address], [place], [place] [address]    Report Status 09/23/2018 FINAL  Final         Radiology Studies: No results found.      Scheduled Meds: . acetaminophen  500 mg Per Tube Q6H  . amLODipine  5 mg Per Tube Daily  . carvedilol  6.25 mg Per Tube BID WC  . chlorhexidine gluconate (MEDLINE KIT)  15 mL Mouth Rinse BID  . Chlorhexidine Gluconate Cloth  6 each Topical Q0600  . clonazePAM  1 mg Per Tube TID  . enoxaparin (LOVENOX) injection  40 mg Subcutaneous Q24H  . feeding supplement (PRO-STAT SUGAR FREE 64)  30 mL  Per Tube TID  . folic acid  1 mg Per Tube Daily  . insulin aspart  2-6 Units Subcutaneous Q4H  . magnesium hydroxide  15 mL Per Tube Daily  . mouth rinse  15 mL Mouth Rinse 10 times per day  . metoCLOPramide (REGLAN) injection  5 mg Intravenous Q6H  . nystatin  5 mL Mouth/Throat TID AC & HS  . oxyCODONE  10 mg Per Tube Q6H  . pantoprazole sodium  40 mg Per Tube Q24H  . pregabalin  25 mg Per Tube BID  . QUEtiapine  25 mg Oral BID  . thiamine  100 mg Per Tube Daily   Continuous Infusions: . sodium chloride    . feeding supplement (JEVITY 1.2 CAL) 1,000 mL (09/23/18 0827)     LOS: 15 days    Time spent: 48mn    PDomenic Polite MD [facility] Page via [email or web address], password [email or web address] After 7PM please contact night-coverage  09/23/2018, 1:24 PM

## 2018-09-24 LAB — BASIC METABOLIC PANEL
Anion gap: 10 (ref 5–15)
BUN: 24 mg/dL — ABNORMAL HIGH (ref 8–23)
CO2: 24 mmol/L (ref 22–32)
Calcium: 8.9 mg/dL (ref 8.9–10.3)
Chloride: 110 mmol/L (ref 98–111)
Creatinine, Ser: 1.22 mg/dL (ref 0.61–1.24)
GFR calc Af Amer: >60 mL/min (ref >60)
GFR calc non Af Amer: >60 mL/min (ref >60)
Glucose, Bld: 113 mg/dL — ABNORMAL HIGH (ref 70–99)
Potassium: 3.8 mmol/L (ref 3.5–5.1)
Sodium: 144 mmol/L (ref 135–145)

## 2018-09-24 LAB — CBC
HCT: 34.5 % — ABNORMAL LOW (ref 39.0–52.0)
Hemoglobin: 11.1 g/dL — ABNORMAL LOW (ref 13.0–17.0)
MCH: 27.9 pg (ref 26.0–34.0)
MCHC: 32.2 g/dL (ref 30.0–36.0)
MCV: 86.7 fL (ref 80.0–100.0)
Platelets: 405 10*3/uL — ABNORMAL HIGH (ref 150–400)
RBC: 3.98 MIL/uL — ABNORMAL LOW (ref 4.22–5.81)
RDW: 12.4 % (ref 11.5–15.5)
WBC: 9.8 10*3/uL (ref 4.0–10.5)
nRBC: 0 % (ref 0.0–0.2)

## 2018-09-24 LAB — GLUCOSE, CAPILLARY
Glucose-Capillary: 104 mg/dL — ABNORMAL HIGH (ref 70–99)
Glucose-Capillary: 116 mg/dL — ABNORMAL HIGH (ref 70–99)
Glucose-Capillary: 120 mg/dL — ABNORMAL HIGH (ref 70–99)
Glucose-Capillary: 121 mg/dL — ABNORMAL HIGH (ref 70–99)
Glucose-Capillary: 147 mg/dL — ABNORMAL HIGH (ref 70–99)
Glucose-Capillary: 96 mg/dL (ref 70–99)

## 2018-09-24 LAB — PHOSPHORUS: Phosphorus: 2.8 mg/dL (ref 2.5–4.6)

## 2018-09-24 LAB — MAGNESIUM: Magnesium: 2.5 mg/dL — ABNORMAL HIGH (ref 1.7–2.4)

## 2018-09-24 MED ORDER — JEVITY 1.2 CAL PO LIQD
1000.0000 mL | ORAL | Status: DC
  Filled 2018-09-24: qty 1000

## 2018-09-24 MED ORDER — QUETIAPINE FUMARATE 50 MG PO TABS
25.0000 mg | ORAL_TABLET | Freq: Two times a day (BID) | ORAL | Status: DC
  Administered 2018-09-24 – 2018-10-17 (×46): 25 mg
  Filled 2018-09-24 (×47): qty 1

## 2018-09-24 MED ORDER — JEVITY 1.2 CAL PO LIQD
1000.0000 mL | ORAL | Status: DC
  Administered 2018-09-24: 16:00:00 1000 mL
  Filled 2018-09-24 (×2): qty 1000

## 2018-09-24 MED ORDER — ADULT MULTIVITAMIN LIQUID CH
15.0000 mL | Freq: Every day | ORAL | Status: DC
  Administered 2018-09-24 – 2018-11-09 (×46): 15 mL
  Filled 2018-09-24 (×51): qty 15

## 2018-09-24 NOTE — Progress Notes (Signed)
Assisted tele visit to patient with daughter.  Marcos Peloso P, RN  

## 2018-09-24 NOTE — Progress Notes (Signed)
CSW spoke with patient's daughter Chy'vonne and son Marlowe Aschoff to provide them with an update on the discharge plan. CSW explained to children that per Marden Noble at [facility], an ambulance ride for this patient to [place] would not be feasible. CSW advised the children that air care would need to be utilized through the [place] airport if they still wanted to pursue placement in [place] explained to children what type of placement their father would require which is a vent SNF. CSW thoroughly explained what a vent SNF is, what the criteria are for admissions, their locations, and answered all of their questions. CSW explained to the children that there are three options for placement here in [place] which are [facility], [facility] in [place], and in [place]. CSW assured children that there would be assistance available to them at all the facilities for transferring him closer to home. CSW explained visitor restrictions at facilities and how COVID has caused them to be constantly evolving. Children were agreeable to CSW contacting [place] facilities and sending his information over for review.   CSW will continue following for discharge planning.  Madilyn Fireman, MSW, LCSW-A Clinical Social Worker Transitions of Care Department [facility] Emergency Department [phone]

## 2018-09-24 NOTE — Progress Notes (Signed)
PROGRESS NOTE    Micheal Carey  CLE:[id number] DOB: [birth year] DOA: 09/08/2018 PCP: [name], MD  Brief Narrative: PCCM transfer to [facility] 76/1 -68 year old male found in parking lot with PEA arrest, unknown downtime required mechanical ventilation, UDS was positive for opiates and alcohol level was 276. 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 -Could not be liberated from a ventilator subsequently was trached on 8/17 -s/p PEG tube on 8/19 -PCCM following and managing vent, sedation, weaning trials -Transferred to [facility] service 8/21, now off IV fentanyl -Hospital course complicated by agitation and intermittent vomiting -Social work consulted and following for disposition-LTAC versus VENT SNF  Assessment & Plan:   Acute hypoxic respiratory failure -From likely accidental opiate/alcohol overdose, in the background of COPD -Admitted following PEA arrest with unknown downtime -Complicated by severe anoxic encephalopathy and persistent vegetative state -Failed to wean from ventilator due to anoxic encephalopathy, -Status post tracheostomy on 8/17 -Also treated for pneumococcal pneumonia -Continue to attempt pressure support trials as tolerated -Sedation per CCM, continue oxycodone, Lyrica, off fentanyl, started Seroquel and klonopin -Social work consult for long-term vent SNF vs LTAC  Cardiac arrest -This was a respiratory arrest from overdose -As above  Dysphagia -Status post PEG tube 8/19 -Had a multiple intermittent episodes of vomiting this weekend, tube feeds had to be held, NG tube was changed to suction  -Finally restarted tube feeds yesterday, tolerating this currently at 30 cc an hour, will attempt to go up to 40 at the most today -Afebrile, no evidence of ileus on KUB  Severe anoxic encephalopathy -By neurology, earlier this admission EEG and MRI findings suggestive  of severe anoxic brain injury -This was discussed with family, prognosis felt to be very poor -Repeat EEG  8/20 suggestive of severe encephalopathy as well -Family wants full aggressive scope of treatment   Pneumococcal pneumonia -Respiratory cultures grew few strep pneumo -blood cultures are negative and afebrile now -Initially treated with Zosyn from 8/12 to 8/13 and then Rocephin from 8/15 to 8/17, off antibiotics now -Reculture if spikes again, last x-ray is unremarkable  Goals of care: -Per PCCM felt to have very poor prognosis with severe anoxic encephalopathy, unknown downtime following cardiac arrest, based on EEG, MRI and neurology recommendations Multiple discussions have been held with family, they want to proceed with full scope of treatment, will need vent SNF or LTAC -Social work consulted  Alcohol abuse -Out of window for withdrawal, maintained on thiamine  COPD -Stable, nebs PRN  DVT prophylaxis: Lovenox Code Status: Full code Family Communication: No family at bedside Disposition Plan: Will need SNF with vent versus LTAC  Consultants:   PCCM following   Procedures:   Antimicrobials:    Subjective: -Finally tolerated tube feeds yesterday, unable to tolerate pressure support this morning -No events overnight  Objective: Vitals:   09/24/18 1000 09/24/18 1100 09/24/18 1128 09/24/18 1138  BP: 114/83 116/76  116/76  Pulse: (!) 101 87  93  Resp: (!) _0 Temp:   99.1 F (37.3 C)   TempSrc:   Oral   SpO2: 96% 98%  97%  Weight:      Height:        Intake/Output Summary (Last 24 hours) at 09/24/2018 1155 Last data filed at 09/24/2018 1100 Gross per 24 hour  Intake 590 ml  Output 1220 ml  Net -630 ml   Filed Weights   09/20/18 0445 09/22/18  3419 09/24/18 0424  Weight: 63.8 kg 63.2 kg 56.8 kg    Examination:  Gen: Eyes open, does not track or follow commands HEENT: Tracheostomy connected to ventilator now on PRVC mode Lungs: Few  scattered rhonchi, otherwise clear CVS: RRR,No Gallops,Rubs or new Murmurs Abd: soft, Non tender, non distended, BS present, PEG tube with tube feeds infusing Extremities: No edema  skin: no new rashes psychiatry: Unable to assess    Data Reviewed:   CBC: Recent Labs  Lab 09/21/18 0418 09/23/18 0417 09/24/18 0320  WBC 13.3* 9.4 9.8  HGB 10.1* 10.9* 11.1*  HCT 30.9* 33.9* 34.5*  MCV 86.8 87.4 86.7  PLT 316 360 379*   Basic Metabolic Panel: Recent Labs  Lab 09/18/18 0418 09/19/18 0257 09/21/18 0418 09/23/18 0417 09/24/18 0320  NA 146* 139 139 145 144  K 3.5 3.8 3.7 3.7 3.8  CL 116* 109 106 109 110  CO2 22 21* _0 GLUCOSE 113* 121* 127* 102* 113*  BUN _1 24*  CREATININE 0.88 0.92 1.13 1.21 1.22  CALCIUM 8.5* 8.8* 8.7* 8.9 8.9  MG  --   --   --   --  2.5*  PHOS  --   --   --   --  2.8   GFR: Estimated Creatinine Clearance: 47.9 mL/min (by C-G formula based on SCr of 1.22 mg/dL). Liver Function Tests: Recent Labs  Lab 09/21/18 0418  AST 62*  ALT 55*  ALKPHOS 125  BILITOT 1.3*  PROT 6.7  ALBUMIN 2.6*   No results for input(s): LIPASE, AMYLASE in the last 168 hours. No results for input(s): AMMONIA in the last 168 hours. Coagulation Profile: No results for input(s): INR, PROTIME in the last 168 hours. Cardiac Enzymes: No results for input(s): CKTOTAL, CKMB, CKMBINDEX, TROPONINI in the last 168 hours. BNP (last 3 results) No results for input(s): PROBNP in the last 8760 hours. HbA1C: No results for input(s): HGBA1C in the last 72 hours. CBG: Recent Labs  Lab 09/23/18 2005 09/24/18 0029 09/24/18 0432 09/24/18 0757 09/24/18 1113  GLUCAP 97 116* 96 121* 104*   Lipid Profile: No results for input(s): CHOL, HDL, LDLCALC, TRIG, CHOLHDL, LDLDIRECT in the last 72 hours. Thyroid Function Tests: No results for input(s): TSH, T4TOTAL, FREET4, T3FREE, THYROIDAB in the last 72 hours. Anemia Panel: No results for input(s): VITAMINB12, FOLATE,  FERRITIN, TIBC, IRON, RETICCTPCT in the last 72 hours. Urine analysis:    Component Value Date/Time   COLORURINE YELLOW 09/08/2018 1641   APPEARANCEUR HAZY (A) 09/08/2018 1641   LABSPEC 1.017 09/08/2018 1641   PHURINE 6.0 09/08/2018 1641   GLUCOSEU 150 (A) 09/08/2018 1641   HGBUR SMALL (A) 09/08/2018 [place] 09/08/2018 1641   KETONESUR NEGATIVE 09/08/2018 1641   PROTEINUR 100 (A) 09/08/2018 1641   NITRITE NEGATIVE 09/08/2018 1641   LEUKOCYTESUR NEGATIVE 09/08/2018 1641   Sepsis Labs: _2 (procalcitonin:4,lacticidven:4)  ) Recent Results (from the past 240 hour(s))  SARS Coronavirus 2 [facility] order, Performed in [place] hospital lab)     Status: None   Collection Time: 09/17/18  9:15 AM  Result Value Ref Range Status   SARS Coronavirus 2 NEGATIVE NEGATIVE Final    Comment: (NOTE) If result is NEGATIVE SARS-CoV-2 target nucleic acids are NOT DETECTED. The SARS-CoV-2 RNA is generally detectable in upper and lower  respiratory specimens during the acute phase of infection. The lowest  concentration of SARS-CoV-2 viral copies this assay can detect is 250  copies /  mL. A negative result does not preclude SARS-CoV-2 infection  and should not be used as the sole basis for treatment or other  patient management decisions.  A negative result may occur with  improper specimen collection / handling, submission of specimen other  than nasopharyngeal swab, presence of viral mutation(s) within the  areas targeted by this assay, and inadequate number of viral copies  (<250 copies / mL). A negative result must be combined with clinical  observations, patient history, and epidemiological information. If result is POSITIVE SARS-CoV-2 target nucleic acids are DETECTED. The SARS-CoV-2 RNA is generally detectable in upper and lower  respiratory specimens dur ing the acute phase of infection.  Positive  results are indicative of active infection with SARS-CoV-2.   Clinical  correlation with patient history and other diagnostic information is  necessary to determine patient infection status.  Positive results do  not rule out bacterial infection or co-infection with other viruses. If result is PRESUMPTIVE POSTIVE SARS-CoV-2 nucleic acids MAY BE PRESENT.   A presumptive positive result was obtained on the submitted specimen  and confirmed on repeat testing.  While 2019 novel coronavirus  (SARS-CoV-2) nucleic acids may be present in the submitted sample  additional confirmatory testing may be necessary for epidemiological  and / or clinical management purposes  to differentiate between  SARS-CoV-2 and other Sarbecovirus currently known to infect humans.  If clinically indicated additional testing with an alternate test  methodology [phone]) is advised. The SARS-CoV-2 RNA is generally  detectable in upper and lower respiratory sp ecimens during the acute  phase of infection. The expected result is Negative. Fact Sheet for Patients:  StrictlyIdeas.no Fact Sheet for Healthcare Providers: BankingDealers.co.za This test is not yet approved or cleared by the [place] FDA and has been authorized for detection and/or diagnosis of SARS-CoV-2 by FDA under an Emergency Use Authorization (EUA).  This EUA will remain in effect (meaning this test can be used) for the duration of the COVID-19 declaration under Section 564(b)(1) of the Act, 21 U.S.C. section 360bbb-3(b)(1), unless the authorization is terminated or revoked sooner. Performed at [facility], [place] [address], [place], [place] [address]   Culture, blood (routine x 2)     Status: None   Collection Time: 09/18/18 10:29 PM   Specimen: BLOOD LEFT FOREARM  Result Value Ref Range Status   Specimen Description BLOOD LEFT FOREARM  Final   Special Requests   Final    BOTTLES DRAWN AEROBIC AND ANAEROBIC Blood Culture results may not be optimal due  to an inadequate volume of blood received in culture bottles   Culture   Final    NO GROWTH 5 DAYS Performed at [facility], [place] [address] [place], [place] [address]    Report Status 09/23/2018 FINAL  Final  Culture, blood (routine x 2)     Status: None   Collection Time: 09/18/18 10:33 PM   Specimen: BLOOD LEFT HAND  Result Value Ref Range Status   Specimen Description BLOOD LEFT HAND  Final   Special Requests   Final    BOTTLES DRAWN AEROBIC AND ANAEROBIC Blood Culture results may not be optimal due to an inadequate volume of blood received in culture bottles   Culture   Final    NO GROWTH 5 DAYS Performed at [facility], [place] [address] [place], [place] [address]    Report Status 09/23/2018 FINAL  Final         Radiology Studies:  No results found.      Scheduled Meds: . acetaminophen  500 mg Per Tube Q6H  . amLODipine  5 mg Per Tube Daily  . carvedilol  6.25 mg Per Tube BID WC  . chlorhexidine gluconate (MEDLINE KIT)  15 mL Mouth Rinse BID  . Chlorhexidine Gluconate Cloth  6 each Topical Q0600  . clonazePAM  1 mg Per Tube TID  . enoxaparin (LOVENOX) injection  40 mg Subcutaneous Q24H  . feeding supplement (PRO-STAT SUGAR FREE 64)  30 mL Per Tube TID  . folic acid  1 mg Per Tube Daily  . insulin aspart  2-6 Units Subcutaneous Q4H  . magnesium hydroxide  15 mL Per Tube Daily  . mouth rinse  15 mL Mouth Rinse 10 times per day  . metoCLOPramide (REGLAN) injection  5 mg Intravenous Q6H  . nystatin  5 mL Mouth/Throat TID AC & HS  . oxyCODONE  10 mg Per Tube Q6H  . pantoprazole sodium  40 mg Per Tube Q24H  . pregabalin  25 mg Per Tube BID  . QUEtiapine  25 mg Oral BID  . thiamine  100 mg Per Tube Daily   Continuous Infusions: . sodium chloride    . feeding supplement (JEVITY 1.2 CAL) 1,000 mL (09/23/18 0827)     LOS: 16 days    Time spent: 47mn    PDomenic Polite MD [facility] Page via [email or web address], password [email or web address] After 7PM  please contact night-coverage  09/24/2018, 11:55 AM

## 2018-09-24 NOTE — Progress Notes (Signed)
Orthopedic Tech Progress Note Patient Details:  Micheal Carey [birth year] [id number] Went to reapply new unna boots bilateral to patient. After removing boots I noticed patient had bad tissue damaged to the heels and notified RN. She reached out to DR about discontinuing unna boots so they could monitoring the heels of patient. Washed patient legs and applied lotion and put back on blue,white and black moon boots  Patient ID: Micheal Carey, male   DOB: February 28, 1950, 68 y.o.   MRN: [id number]   Micheal Carey 09/24/2018, 11:31 AM

## 2018-09-24 NOTE — Progress Notes (Signed)
CSW attempted to reach [facility] in admissions at [place] to determine if there was any bed availability, no answer so voicemail was left requesting a return call.  CSW spoke with Santiago Glad at [facility] in [place] who states that there are no vent beds available at this time.  CSW attempted to reach admissions staff at [facility] in [place] without success, voicemail was left requesting a return call.  Madilyn Fireman, MSW, LCSW-A Clinical Social Worker [facility] [phone]

## 2018-09-25 ENCOUNTER — Inpatient Hospital Stay (HOSPITAL_COMMUNITY): Payer: Medicare Other

## 2018-09-25 DIAGNOSIS — L8990 Pressure ulcer of unspecified site, unspecified stage: Secondary | ICD-10-CM | POA: Diagnosis not present

## 2018-09-25 LAB — GLUCOSE, CAPILLARY
Glucose-Capillary: 106 mg/dL — ABNORMAL HIGH (ref 70–99)
Glucose-Capillary: 110 mg/dL — ABNORMAL HIGH (ref 70–99)
Glucose-Capillary: 119 mg/dL — ABNORMAL HIGH (ref 70–99)
Glucose-Capillary: 130 mg/dL — ABNORMAL HIGH (ref 70–99)
Glucose-Capillary: 133 mg/dL — ABNORMAL HIGH (ref 70–99)
Glucose-Capillary: 133 mg/dL — ABNORMAL HIGH (ref 70–99)
Glucose-Capillary: 146 mg/dL — ABNORMAL HIGH (ref 70–99)

## 2018-09-25 LAB — BASIC METABOLIC PANEL
Anion gap: 11 (ref 5–15)
BUN: 21 mg/dL (ref 8–23)
CO2: 24 mmol/L (ref 22–32)
Calcium: 8.9 mg/dL (ref 8.9–10.3)
Chloride: 114 mmol/L — ABNORMAL HIGH (ref 98–111)
Creatinine, Ser: 1.2 mg/dL (ref 0.61–1.24)
GFR calc Af Amer: >60 mL/min (ref >60)
GFR calc non Af Amer: >60 mL/min (ref >60)
Glucose, Bld: 146 mg/dL — ABNORMAL HIGH (ref 70–99)
Potassium: 3.3 mmol/L — ABNORMAL LOW (ref 3.5–5.1)
Sodium: 149 mmol/L — ABNORMAL HIGH (ref 135–145)

## 2018-09-25 LAB — CBC
HCT: 34.3 % — ABNORMAL LOW (ref 39.0–52.0)
Hemoglobin: 10.9 g/dL — ABNORMAL LOW (ref 13.0–17.0)
MCH: 27.8 pg (ref 26.0–34.0)
MCHC: 31.8 g/dL (ref 30.0–36.0)
MCV: 87.5 fL (ref 80.0–100.0)
Platelets: 495 10*3/uL — ABNORMAL HIGH (ref 150–400)
RBC: 3.92 MIL/uL — ABNORMAL LOW (ref 4.22–5.81)
RDW: 12.6 % (ref 11.5–15.5)
WBC: 10.9 10*3/uL — ABNORMAL HIGH (ref 4.0–10.5)
nRBC: 0 % (ref 0.0–0.2)

## 2018-09-25 MED ORDER — ACETAMINOPHEN 500 MG PO TABS
500.0000 mg | ORAL_TABLET | Freq: Once | ORAL | Status: AC
  Administered 2018-09-25: 20:00:00 500 mg
  Filled 2018-09-25: qty 1

## 2018-09-25 MED ORDER — DEXTROSE 5 % IV SOLN
INTRAVENOUS | Status: DC
  Administered 2018-09-25 – 2018-09-27 (×2): via INTRAVENOUS

## 2018-09-25 MED ORDER — JEVITY 1.2 CAL PO LIQD
1000.0000 mL | ORAL | Status: DC
  Administered 2018-09-25 – 2018-10-01 (×7): 1000 mL
  Filled 2018-09-25 (×9): qty 1000

## 2018-09-25 MED ORDER — POTASSIUM CHLORIDE 20 MEQ PO PACK
20.0000 meq | PACK | Freq: Two times a day (BID) | ORAL | Status: AC
  Administered 2018-09-25 (×2): 20 meq via ORAL
  Filled 2018-09-25 (×2): qty 1

## 2018-09-25 NOTE — Progress Notes (Signed)
Physical Therapy Treatment Patient Details Name: Micheal Carey MRN: [id number] DOB: 02/02/1950 Today's Date: 09/25/2018    History of Present Illness Pt is a 68 year old man admitted 09/08/18 after found down with PEA arrest, unknown down time. Pt intubated. +severe hypoxic encephalopathy, ETOH and opiates.    PT Comments    Pt continues to demonstrate little response to any stimuli. Will continue to follow 1x week for any increased ability to participate/respond.    Follow Up Recommendations  LTACH;SNF     Equipment Recommendations  Other (comment)(To be determined at next venue)    Recommendations for Other Services       Precautions / Restrictions Precautions Precautions: Fall;Other (comment) Precaution Comments: trach/ventilator, rectal pouch Restrictions Weight Bearing Restrictions: No    Mobility  Bed Mobility                  Transfers                    Ambulation/Gait                 Stairs             Wheelchair Mobility    Modified Rankin (Stroke Patients Only)       Balance                                            Cognition Arousal/Alertness: Lethargic   Overall Cognitive Status: Impaired/Different from baseline                                 General Comments: Pt  with only brief eye opening to sternal rub.       Exercises Other Exercises Other Exercises: Performed PROM to UE and LE's and neck.  No response to any.    General Comments        Pertinent Vitals/Pain Pain Assessment: Faces Faces Pain Scale: No hurt    Home Living                      Prior Function            PT Goals (current goals can now be found in the care plan section) Acute Rehab PT Goals Patient Stated Goal: unable to state Progress towards PT goals: Not progressing toward goals - comment    Frequency    Min 1X/week      PT Plan Current plan remains appropriate     Co-evaluation              AM-PAC PT "6 Clicks" Mobility   Outcome Measure  Help needed turning from your back to your side while in a flat bed without using bedrails?: Total Help needed moving from lying on your back to sitting on the side of a flat bed without using bedrails?: Total Help needed moving to and from a bed to a chair (including a wheelchair)?: Total Help needed standing up from a chair using your arms (e.g., wheelchair or bedside chair)?: Total Help needed to walk in hospital room?: Total Help needed climbing 3-5 steps with a railing? : Total 6 Click Score: 6    End of Session   Activity Tolerance: Patient limited by lethargy Patient left: in bed;with call bell/phone within reach  PT Visit Diagnosis: Other abnormalities of gait and mobility (R26.89)     Time: [id number] PT Time Calculation (min) (ACUTE ONLY): 10 min  Charges:  $Therapeutic Activity: 8-22 mins                     [facility] Sres[facility] Cressler PT Acute Rehabilitation Services Pager [phone] Office [phone]    Angelina OkCary W [facility] 09/25/2018, 10:05 AM

## 2018-09-25 NOTE — Progress Notes (Signed)
PROGRESS NOTE    Micheal Carey  ZOX:[id number] DOB: [birth year] DOA: 09/08/2018 PCP: [name], MD   Brief Narrative: 68 year old male found in parking lot with PEA arrest, unknown downtime require mechanical ventilation, UDS was positive for opioids and alcohol level was 276.  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 Patient could not be extubated and subsequently was trached on 8/17. -Status post PEG tube on 8/19.. -PCCM following and managing vent, sedation, weaning trials. -Transfer to [facility] service on 8/20 first, now off IV fentanyl. [facility] course complicated by agitation and intermittent vomiting. The Social worker consulted and following for disposition LTAC versus SNF    Assessment & Plan:   Active Problems:   Acute respiratory failure (HCC)   Malnutrition of moderate degree   Cardiac arrest (HCC)   Pressure injury of skin   1-Acute hypoxic respiratory failure: -Likely related to accidental opioid/alcohol overdose, in the background of COPD. -Admitted following PEA arrest with unknown downtime -Complicated by severe anoxic encephalopathy and persistent vegetative state. -Failed to wean from ventilator due to anoxic encephalopathy. -Status post tracheostomy on 8/17. -Also treated for pneumococcal pneumonia. -Continue to attempt pressure support trials as tolerated. -Sedation per CCM, continue oxycodone, Lyrica, started Seroquel and Klonopin. -Social work consult for long-term (SNF versus LTAC  2-Cardiac arrest: This was likely respiratory arrest from overdose.  3-Dysphagia: -Status post PEG tube 8/19. -Had a multiple intermittent episode of vomiting this weekend, tube feeds had to be held, NG tube was changed to suction. -Finally restarted tube feedings on 8/23, currently tolerating tube feeding at 40 cc/h. No evidence of ileus on KUB.  4-severe anoxic  encephalopathy: -Evaluated by neurology, earlier this admission EEG and MRI findings suggestive of severe anoxic brain injury. -This was discussed with family, prognosis felt to be very poor. -Repeat EEG 8/20 suggestive of severe encephalopathy as well. -Family wants full aggressive scope of treatment   5-Pneumococcal pneumonia: -Respiratory culture grew few strep pneumo. -Blood cultures are negative -Initially treated with Zosyn from 8/12 through 8/13 and then Rocephin from 80/15 to 80/17, off antibiotics now.  Hypernatremia: We will start IV fluids D5 for 24 hours. Hypokalemia: We will start potassium supplement twice daily x2 Tachycardia; hydrate  HTN; on Norvasc, carvedilol.   Goals of care: Per CCM felt to have very poor prognosis with severe anoxic encephalopathy, with no downtime following cardiac arrest, based on EEG and MRI and neurology recommendation. Per discussion have been held with family, they want to proceed with full scope of treatment will need vent in SNF or LTAC  Alcohol abuse: Out of window for withdrawal. Continue with thiamine.  COPD, stable nebulizers as needed  Pressure injury left heel not present on admission. Pressure injury  Pressure Injury 09/24/18 Heel Left Deep Tissue Injury - Purple or maroon localized area of discolored intact skin or blood-filled blister due to damage of underlying soft tissue from pressure and/or shear. (Active)  09/24/18 1115  Location: Heel  Location Orientation: Left  Staging: Deep Tissue Injury - Purple or maroon localized area of discolored intact skin or blood-filled blister due to damage of underlying soft tissue from pressure and/or shear.  Wound Description (Comments):   Present on Admission: No     Pressure Injury 09/24/18 Right Deep Tissue Injury - Purple or maroon localized area of discolored intact skin or blood-filled blister due to damage of underlying soft tissue from pressure and/or shear. (Active)  09/24/18  1120  Location:   Location Orientation: Right  Staging: Deep Tissue Injury - Purple or maroon localized area of discolored intact skin or blood-filled blister due to damage of underlying soft tissue from pressure and/or shear.  Wound Description (Comments):   Present on Admission: No     Nutrition Problem: Moderate Malnutrition Etiology: social / environmental circumstances(Hx substance abuse)    Signs/Symptoms: moderate muscle depletion, moderate fat depletion    Interventions: Tube feeding  Estimated body mass index is 19.24 kg/m as calculated from the following:   Height as of this encounter: '5\' 8"'  (1.727 m).   Weight as of this encounter: 57.4 kg.   DVT prophylaxis: Lovenox Code Status: Full code Family Communication: Family at bedside Disposition Plan: We will need SNF versus LTAC Consultants:   CCN following  Procedures:   None  Antimicrobials:  Currently none  Subjective: Open eyes, not following commands, not answering questions  Objective: Vitals:   09/25/18 0700 09/25/18 0711 09/25/18 0737 09/25/18 0800  BP: 136/78 136/78  136/80  Pulse: 91 94  (!) 101  Resp: 16   13  Temp:   98.7 F (37.1 C)   TempSrc:   Axillary   SpO2: 99% 99%  99%  Weight:      Height:        Intake/Output Summary (Last 24 hours) at 09/25/2018 0926 Last data filed at 09/25/2018 0600 Gross per 24 hour  Intake 980 ml  Output 340 ml  Net 640 ml   Filed Weights   09/22/18 0436 09/24/18 0424 09/25/18 0500  Weight: 63.2 kg 56.8 kg 57.4 kg    Examination:  General exam: Appears calm and comfortable  Respiratory system: Trach in place on the vent bilateral movement Cardiovascular system: S1 & S2 heard, RRR. No JVD, murmurs, rubs, gallops or clicks. No pedal edema. Gastrointestinal system: Abdomen is nondistended, soft and nontender. No organomegaly or masses felt. Normal bowel sounds heard. Central nervous system: Alert, not following commands Extremities: No edema  Skin: No rashes, lesions or ulcers   Data Reviewed: I have personally reviewed following labs and imaging studies  CBC: Recent Labs  Lab 09/21/18 0418 09/23/18 0417 09/24/18 0320 09/25/18 0830  WBC 13.3* 9.4 9.8 10.9*  HGB 10.1* 10.9* 11.1* 10.9*  HCT 30.9* 33.9* 34.5* 34.3*  MCV 86.8 87.4 86.7 87.5  PLT 316 360 405* 754*   Basic Metabolic Panel: Recent Labs  Lab 09/19/18 0257 09/21/18 0418 09/23/18 0417 09/24/18 0320 09/25/18 0830  NA 139 139 145 144 149*  K 3.8 3.7 3.7 3.8 3.3*  CL 109 106 109 110 114*  CO2 21* '24 23 24 24  ' GLUCOSE 121* 127* 102* 113* 146*  BUN '9 23 20 ' 24* 21  CREATININE 0.92 1.13 1.21 1.22 1.20  CALCIUM 8.8* 8.7* 8.9 8.9 8.9  MG  --   --   --  2.5*  --   PHOS  --   --   --  2.8  --    GFR: Estimated Creatinine Clearance: 49.2 mL/min (by C-G formula based on SCr of 1.2 mg/dL). Liver Function Tests: Recent Labs  Lab 09/21/18 0418  AST 62*  ALT 55*  ALKPHOS 125  BILITOT 1.3*  PROT 6.7  ALBUMIN 2.6*   No results for input(s): LIPASE, AMYLASE in the last 168 hours. No results for input(s): AMMONIA in the last 168 hours. Coagulation Profile: No results for input(s): INR, PROTIME in the last 168 hours. Cardiac Enzymes: No results for input(s): CKTOTAL, CKMB, CKMBINDEX,  TROPONINI in the last 168 hours. BNP (last 3 results) No results for input(s): PROBNP in the last 8760 hours. HbA1C: No results for input(s): HGBA1C in the last 72 hours. CBG: Recent Labs  Lab 09/24/18 1551 09/24/18 1957 09/25/18 0016 09/25/18 0420 09/25/18 0736  GLUCAP 147* 120* 119* 110* 106*   Lipid Profile: No results for input(s): CHOL, HDL, LDLCALC, TRIG, CHOLHDL, LDLDIRECT in the last 72 hours. Thyroid Function Tests: No results for input(s): TSH, T4TOTAL, FREET4, T3FREE, THYROIDAB in the last 72 hours. Anemia Panel: No results for input(s): VITAMINB12, FOLATE, FERRITIN, TIBC, IRON, RETICCTPCT in the last 72 hours. Sepsis Labs: No results for input(s):  PROCALCITON, LATICACIDVEN in the last 168 hours.  Recent Results (from the past 240 hour(s))  SARS Coronavirus 2 [facility] order, Performed in [facility] hospital lab)     Status: None   Collection Time: 09/17/18  9:15 AM  Result Value Ref Range Status   SARS Coronavirus 2 NEGATIVE NEGATIVE Final    Comment: (NOTE) If result is NEGATIVE SARS-CoV-2 target nucleic acids are NOT DETECTED. The SARS-CoV-2 RNA is generally detectable in upper and lower  respiratory specimens during the acute phase of infection. The lowest  concentration of SARS-CoV-2 viral copies this assay can detect is 250  copies / mL. A negative result does not preclude SARS-CoV-2 infection  and should not be used as the sole basis for treatment or other  patient management decisions.  A negative result may occur with  improper specimen collection / handling, submission of specimen other  than nasopharyngeal swab, presence of viral mutation(s) within the  areas targeted by this assay, and inadequate number of viral copies  (<250 copies / mL). A negative result must be combined with clinical  observations, patient history, and epidemiological information. If result is POSITIVE SARS-CoV-2 target nucleic acids are DETECTED. The SARS-CoV-2 RNA is generally detectable in upper and lower  respiratory specimens dur ing the acute phase of infection.  Positive  results are indicative of active infection with SARS-CoV-2.  Clinical  correlation with patient history and other diagnostic information is  necessary to determine patient infection status.  Positive results do  not rule out bacterial infection or co-infection with other viruses. If result is PRESUMPTIVE POSTIVE SARS-CoV-2 nucleic acids MAY BE PRESENT.   A presumptive positive result was obtained on the submitted specimen  and confirmed on repeat testing.  While 2019 novel coronavirus  (SARS-CoV-2) nucleic acids may be present in the submitted sample  additional  confirmatory testing may be necessary for epidemiological  and / or clinical management purposes  to differentiate between  SARS-CoV-2 and other Sarbecovirus currently known to infect humans.  If clinically indicated additional testing with an alternate test  methodology [phone]) is advised. The SARS-CoV-2 RNA is generally  detectable in upper and lower respiratory sp ecimens during the acute  phase of infection. The expected result is Negative. Fact Sheet for Patients:  StrictlyIdeas.no Fact Sheet for Healthcare Providers: BankingDealers.co.za This test is not yet approved or cleared by the [place] FDA and has been authorized for detection and/or diagnosis of SARS-CoV-2 by FDA under an Emergency Use Authorization (EUA).  This EUA will remain in effect (meaning this test can be used) for the duration of the COVID-19 declaration under Section 564(b)(1) of the Act, 21 U.S.C. section 360bbb-3(b)(1), unless the authorization is terminated or revoked sooner. Performed at [facility], [place] [address], [place], [place] [address]   Culture, blood (routine x 2)  Status: None   Collection Time: 09/18/18 10:29 PM   Specimen: BLOOD LEFT FOREARM  Result Value Ref Range Status   Specimen Description BLOOD LEFT FOREARM  Final   Special Requests   Final    BOTTLES DRAWN AEROBIC AND ANAEROBIC Blood Culture results may not be optimal due to an inadequate volume of blood received in culture bottles   Culture   Final    NO GROWTH 5 DAYS Performed at [facility], [place] [address], [place], [place] [address]    Report Status 09/23/2018 FINAL  Final  Culture, blood (routine x 2)     Status: None   Collection Time: 09/18/18 10:33 PM   Specimen: BLOOD LEFT HAND  Result Value Ref Range Status   Specimen Description BLOOD LEFT HAND  Final   Special Requests   Final    BOTTLES DRAWN AEROBIC AND ANAEROBIC Blood Culture results may not be  optimal due to an inadequate volume of blood received in culture bottles   Culture   Final    NO GROWTH 5 DAYS Performed at [facility], [place] [address], [place], [place] [address]    Report Status 09/23/2018 FINAL  Final         Radiology Studies: No results found.      Scheduled Meds: . acetaminophen  500 mg Per Tube Q6H  . amLODipine  5 mg Per Tube Daily  . carvedilol  6.25 mg Per Tube BID WC  . chlorhexidine gluconate (MEDLINE KIT)  15 mL Mouth Rinse BID  . Chlorhexidine Gluconate Cloth  6 each Topical Q0600  . clonazePAM  1 mg Per Tube TID  . enoxaparin (LOVENOX) injection  40 mg Subcutaneous Q24H  . feeding supplement (JEVITY 1.2 CAL)  1,000 mL Per Tube Q24H  . feeding supplement (PRO-STAT SUGAR FREE 64)  30 mL Per Tube TID  . folic acid  1 mg Per Tube Daily  . insulin aspart  2-6 Units Subcutaneous Q4H  . mouth rinse  15 mL Mouth Rinse 10 times per day  . metoCLOPramide (REGLAN) injection  5 mg Intravenous Q6H  . multivitamin  15 mL Per Tube Daily  . oxyCODONE  10 mg Per Tube Q6H  . pantoprazole sodium  40 mg Per Tube Q24H  . potassium chloride  20 mEq Oral BID  . pregabalin  25 mg Per Tube BID  . QUEtiapine  25 mg Per Tube BID  . thiamine  100 mg Per Tube Daily   Continuous Infusions: . sodium chloride       LOS: 17 days    Time spent: 35 minutes    Elmarie Shiley, MD [facility] Pager [phone]  If 7PM-7AM, please contact night-coverage [email or web address] Password [facility] 09/25/2018, 9:26 AM

## 2018-09-25 NOTE — Progress Notes (Addendum)
12pm: CSW faxed patient's information to [facility] in [place] for review. CSW confirmed that information was received.  8am: CSW placed additional call to [facility] in [place], no answer so voicemail was left requesting a return call.  CSW spoke with Erline Levine at [facility] who stated that there are no beds available at this time. Erline Levine reports that there are four people on the vent SNF waiting list from the LTAC portion of [facility] so those patient's have priority.  Madilyn Fireman, MSW, LCSW-A Clinical Social Worker [facility] [phone]

## 2018-09-25 NOTE — Progress Notes (Signed)
Assisted tele visit to patient with daughter.  Sydell Prowell P, RN  

## 2018-09-25 NOTE — Progress Notes (Signed)
Nutrition Follow-up  DOCUMENTATION CODES:   Non-severe (moderate) malnutrition in context of social or environmental circumstances  INTERVENTION:   Increase tube feeding:  -Jevity 1.2 @ 45 ml/hr -30 ml Prostat TID  Provides: 1596 kcals, 105 grams protein, 872 ml free water. Meets 100% of needs.   NUTRITION DIAGNOSIS:   Moderate Malnutrition related to social / environmental circumstances(Hx substance abuse) as evidenced by moderate muscle depletion, moderate fat depletion.  Ongoing  GOAL:   Patient will meet greater than or equal to 90% of their needs  Addressed via TF  MONITOR:   Vent status, TF tolerance, Labs  REASON FOR ASSESSMENT:   Consult Enteral/tube feeding initiation and management  ASSESSMENT:   68 yo male admitted with PEA arrest after being found down in a parking lot. Urine screen positive for opiates, ETOH positive. PMH includes alcohol and opiate abuse, CAP.   8/17- tracheostomy  8/19- PEG placed  8/20- TF held due to vomiting 8/21- TF restarted at trickle  RD working remotely.  Spoke with RN via phone. Pt receiving Jevity 1.2 @ 40 ml/hr without complication. RD to adjust needs-increase to goal today. Family wants full aggressive treatment. Awaiting placement vent SNF vs LTAC.   Admission weight: 65.9 kg Current weight 57.4 kg   Patient remains on ventilator support via trach  MV: 6.9 L/min Temp (24hrs), Avg:98.4 F (36.9 C), Min:97 F (36.1 C), Max:100.3 F (37.9 C)   I/O: +1,290 ml since 8/12  UOP: 430 ml x 24 hrs   Drips: D5 @ 50 ml/hr  Medications: folic acid, SS novolog, 5 mg reglan QID, liquid MVI, 20 mEq KCl BID, thimaine Labs: Na 149 (H) K 3.3 (L) CBG 110-147  Diet Order:   Diet Order    None      EDUCATION NEEDS:   No education needs have been identified at this time  Skin:  Skin Assessment: Reviewed RN Assessment  Last BM:  8/25  Height:   Ht Readings from Last 1 Encounters:  09/08/18 5\' 8"  (1.727 m)     Weight:   Wt Readings from Last 1 Encounters:  09/25/18 57.4 kg    Ideal Body Weight:  70 kg  BMI:  Body mass index is 19.24 kg/m.  Estimated Nutritional Needs:   Kcal:  1550-1750 kcal  Protein:  100-115 grams  Fluid:  >/= 1.4 L/day   Mariana Single RD, LDN Clinical Nutrition Pager # - [phone]

## 2018-09-25 NOTE — NC FL2 (Addendum)
[place] LEVEL OF CARE SCREENING TOOL     IDENTIFICATION  Patient Name: Micheal Carey Birthdate: [birth year] Sex: male Admission Date (Current Location): 09/08/2018  [facility] [place] [facility] [place] Number:  Herbalist and Address:  [facility] [place], [place] [address], [place], [place] [address]      Provider Number:    Attending Physician Name and Address:  Elmarie Shiley, MD  Relative Name and Phone Number:  Jermain Curt, [phone]    Current Level of Care: Hospital Recommended Level of Care: Vent SNF Prior Approval Number:    Date Approved/Denied:   PASRR Number:   [id number]  Discharge Plan: Other (Comment)(Vent SNF)    Current Diagnoses: Patient Active Problem List   Diagnosis Date Noted  . Pressure injury of skin 09/25/2018  . Cardiac arrest ([place])   . Malnutrition of moderate degree 09/14/2018  . Acute respiratory failure ([place]) 09/08/2018  . Trochanteric bursitis, right hip 08/27/2017  . History of total hip arthroplasty, right 08/27/2017  . COPD (chronic obstructive pulmonary disease) ([place]) 12/03/2015  . Hypoglycemia 12/03/2015  . Sepsis ([place]) 12/03/2015  . Intractable vomiting with nausea 10/15/2015  . Transaminasemia 10/15/2015  . Diarrhea 10/15/2015  . Lactic acidosis 10/14/2015  . AKI (acute kidney injury) ([place]) 10/14/2015  . Alcohol abuse 10/14/2015  . COPD with acute exacerbation ([place]) 10/14/2015  . CAP (community acquired pneumonia) 10/14/2015  . Atypical lymphocytes present on peripheral blood smear   . Dehydration     Orientation RESPIRATION BLADDER Height & Weight          Incontinent, Indwelling catheter Weight: 126 lb 8.7 oz (57.4 kg) Height:  '5\' 8"'  (172.7 cm)  BEHAVIORAL SYMPTOMS/MOOD NEUROLOGICAL BOWEL NUTRITION STATUS      Incontinent NG/panda  AMBULATORY STATUS COMMUNICATION OF NEEDS Skin   Total Care Does not communicate Other (Comment)(Heel wounds)                       Personal Care  Assistance Level of Assistance  Total care, Dressing, Feeding, Bathing Bathing Assistance: Maximum assistance Feeding assistance: Maximum assistance Dressing Assistance: Maximum assistance Total Care Assistance: Maximum assistance   Functional Limitations Info  Speech, Hearing, Sight Sight Info: Adequate Hearing Info: Impaired Speech Info: Impaired    SPECIAL CARE FACTORS FREQUENCY  OT (By licensed OT), PT (By licensed PT)     PT Frequency: 5x weekly OT Frequency: 5x weekly            Contractures Contractures Info: Not present    Additional Factors Info  Allergies, Code Status Code Status Info: Full code Allergies Info: No known allergies           Current Medications (09/25/2018):  This is the current hospital active medication list Current Facility-Administered Medications  Medication Dose Route Frequency Provider Last Rate Last Dose  . 0.9 %  sodium chloride infusion   Intravenous PRN Chesley Mires, MD      . acetaminophen (TYLENOL) tablet 500 mg  500 mg Per Tube Q6H Candee Furbish, MD   500 mg at 09/25/18 1128  . albuterol (PROVENTIL) (2.5 MG/3ML) 0.083% nebulizer solution 2.5 mg  2.5 mg Nebulization Q2H PRN Rush Farmer, MD   2.5 mg at 09/22/18 1925  . amLODipine (NORVASC) tablet 5 mg  5 mg Per Tube Daily Candee Furbish, MD   5 mg at 09/25/18 1018  . bisacodyl (DULCOLAX) suppository 10 mg  10 mg Rectal Daily PRN Chesley Mires,  MD      . carvedilol (COREG) tablet 6.25 mg  6.25 mg Per Tube BID WC Candee Furbish, MD   6.25 mg at 09/25/18 0819  . chlorhexidine gluconate (MEDLINE KIT) (PERIDEX) 0.12 % solution 15 mL  15 mL Mouth Rinse BID Rush Farmer, MD   15 mL at 09/25/18 0820  . Chlorhexidine Gluconate Cloth 2 % PADS 6 each  6 each Topical Q0600 Chesley Mires, MD   6 each at 09/24/18 1355  . clonazePAM (KLONOPIN) tablet 1 mg  1 mg Per Tube TID Rush Farmer, MD   1 mg at 09/25/18 1017  . dextrose 5 % solution   Intravenous Continuous Regalado, Belkys A, MD 50  mL/hr at 09/25/18 1044    . docusate (COLACE) 50 MG/5ML liquid 100 mg  100 mg Per Tube BID PRN Chesley Mires, MD      . enoxaparin (LOVENOX) injection 40 mg  40 mg Subcutaneous Q24H Candee Furbish, MD   40 mg at 09/24/18 2128  . feeding supplement (JEVITY 1.2 CAL) liquid 1,000 mL  1,000 mL Per Tube Q24H Domenic Polite, MD 40 mL/hr at 09/24/18 1600 1,000 mL at 09/24/18 1600  . feeding supplement (PRO-STAT SUGAR FREE 64) liquid 30 mL  30 mL Per Tube TID Candee Furbish, MD   30 mL at 09/25/18 1018  . fentaNYL (SUBLIMAZE) injection 25-100 mcg  25-100 mcg Intravenous Q30 min PRN Chesley Mires, MD   100 mcg at 09/23/18 0547  . folic acid (FOLVITE) tablet 1 mg  1 mg Per Tube Daily Chesley Mires, MD   1 mg at 09/25/18 1017  . insulin aspart (novoLOG) injection 2-6 Units  2-6 Units Subcutaneous Q4H Chesley Mires, MD   2 Units at 09/25/18 1130  . MEDLINE mouth rinse  15 mL Mouth Rinse 10 times per day Rush Farmer, MD   15 mL at 09/25/18 1128  . metoCLOPramide (REGLAN) injection 5 mg  5 mg Intravenous Q6H Domenic Polite, MD   5 mg at 09/25/18 1129  . multivitamin liquid 15 mL  15 mL Per Tube Daily Domenic Polite, MD   15 mL at 09/25/18 1018  . ondansetron (ZOFRAN) tablet 4 mg  4 mg Per Tube Q6H PRN Sherren Kerns D, RPH       Or  . ondansetron (ZOFRAN) injection 4 mg  4 mg Intravenous Q6H PRN Werner Lean, RPH   4 mg at 09/23/18 0000  . oxyCODONE (Oxy IR/ROXICODONE) immediate release tablet 10 mg  10 mg Per Tube Q6H Candee Furbish, MD   10 mg at 09/25/18 0820  . oxyCODONE (Oxy IR/ROXICODONE) immediate release tablet 5 mg  5 mg Per Tube Q4H PRN Chesley Mires, MD      . pantoprazole sodium (PROTONIX) 40 mg/20 mL oral suspension 40 mg  40 mg Per Tube Q24H Chesley Mires, MD   40 mg at 09/25/18 1021  . pneumococcal 23 valent vaccine (PNU-IMMUNE) injection 0.5 mL  0.5 mL Intramuscular Prior to discharge Rush Farmer, MD      . potassium chloride (KLOR-CON) packet 20 mEq  20 mEq Oral BID Regalado, Belkys A,  MD   20 mEq at 09/25/18 1128  . pregabalin (LYRICA) capsule 25 mg  25 mg Per Tube BID Candee Furbish, MD   25 mg at 09/25/18 1018  . QUEtiapine (SEROQUEL) tablet 25 mg  25 mg Per Tube BID Domenic Polite, MD   25 mg at 09/25/18 1018  .  thiamine (VITAMIN B-1) tablet 100 mg  100 mg Per Tube Daily Chesley Mires, MD   100 mg at 09/25/18 1018     Discharge Medications: Please see discharge summary for a list of discharge medications.  Relevant Imaging Results:  Relevant Lab Results:   Additional Information SSN: [id number]  Archie Endo, LCSW

## 2018-09-25 NOTE — Progress Notes (Signed)
SLP Cancellation Note  Patient Details Name: Micheal Carey MRN: [id number] DOB: January 19, 1951   Cancelled treatment:       Reason Eval/Treat Not Completed: Other (comment) SLP continues to follow for potential to try PMV. Per chart, he is not medically/cognitively ready at this time.    [name] 09/25/2018, 8:35 AM   Pollyann Glen, M.A. [place] Acute Environmental education officer [phone] Office [phone]

## 2018-09-25 NOTE — Progress Notes (Signed)
Tracheal Sutures removed per MD order. No complications, VS within normal limits. No bleeding noted, slight amount of dried blood noted on trach to be cleaned off per MD

## 2018-09-26 LAB — CBC
HCT: 34.6 % — ABNORMAL LOW (ref 39.0–52.0)
Hemoglobin: 11.3 g/dL — ABNORMAL LOW (ref 13.0–17.0)
MCH: 28.4 pg (ref 26.0–34.0)
MCHC: 32.7 g/dL (ref 30.0–36.0)
MCV: 86.9 fL (ref 80.0–100.0)
Platelets: 444 10*3/uL — ABNORMAL HIGH (ref 150–400)
RBC: 3.98 MIL/uL — ABNORMAL LOW (ref 4.22–5.81)
RDW: 12.6 % (ref 11.5–15.5)
WBC: 12.8 10*3/uL — ABNORMAL HIGH (ref 4.0–10.5)
nRBC: 0 % (ref 0.0–0.2)

## 2018-09-26 LAB — URINALYSIS, ROUTINE W REFLEX MICROSCOPIC
Bilirubin Urine: NEGATIVE
Glucose, UA: NEGATIVE mg/dL
Hgb urine dipstick: NEGATIVE
Ketones, ur: NEGATIVE mg/dL
Leukocytes,Ua: NEGATIVE
Nitrite: NEGATIVE
Protein, ur: NEGATIVE mg/dL
Specific Gravity, Urine: 1.01 (ref 1.005–1.030)
pH: 7 (ref 5.0–8.0)

## 2018-09-26 LAB — BASIC METABOLIC PANEL
Anion gap: 10 (ref 5–15)
BUN: 22 mg/dL (ref 8–23)
CO2: 24 mmol/L (ref 22–32)
Calcium: 8.9 mg/dL (ref 8.9–10.3)
Chloride: 112 mmol/L — ABNORMAL HIGH (ref 98–111)
Creatinine, Ser: 1.08 mg/dL (ref 0.61–1.24)
GFR calc Af Amer: >60 mL/min (ref >60)
GFR calc non Af Amer: >60 mL/min (ref >60)
Glucose, Bld: 137 mg/dL — ABNORMAL HIGH (ref 70–99)
Potassium: 3.9 mmol/L (ref 3.5–5.1)
Sodium: 146 mmol/L — ABNORMAL HIGH (ref 135–145)

## 2018-09-26 LAB — GLUCOSE, CAPILLARY
Glucose-Capillary: 106 mg/dL — ABNORMAL HIGH (ref 70–99)
Glucose-Capillary: 116 mg/dL — ABNORMAL HIGH (ref 70–99)
Glucose-Capillary: 121 mg/dL — ABNORMAL HIGH (ref 70–99)
Glucose-Capillary: 125 mg/dL — ABNORMAL HIGH (ref 70–99)
Glucose-Capillary: 130 mg/dL — ABNORMAL HIGH (ref 70–99)
Glucose-Capillary: 133 mg/dL — ABNORMAL HIGH (ref 70–99)

## 2018-09-26 LAB — AMMONIA: Ammonia: 26 umol/L (ref 9–35)

## 2018-09-26 MED ORDER — OXYCODONE HCL 5 MG PO TABS
5.0000 mg | ORAL_TABLET | Freq: Four times a day (QID) | ORAL | Status: DC
  Administered 2018-09-26 – 2018-09-28 (×7): 5 mg
  Filled 2018-09-26 (×7): qty 1

## 2018-09-26 MED ORDER — ACETAMINOPHEN 500 MG PO TABS
500.0000 mg | ORAL_TABLET | Freq: Four times a day (QID) | ORAL | Status: DC | PRN
  Administered 2018-09-26 – 2018-11-05 (×7): 500 mg
  Filled 2018-09-26 (×8): qty 1

## 2018-09-26 NOTE — Progress Notes (Signed)
CSW spoke with Denyse Amass at [facility] in [place] about the referral that was submitted for this patient. Denyse Amass reports that the patient's information is under review by respiratory director at this time.  Madilyn Fireman, MSW, LCSW-A Clinical Social Worker [facility] [place] [facility] [phone]

## 2018-09-26 NOTE — Progress Notes (Addendum)
PROGRESS NOTE    Micheal Carey  ZHY:[id number] DOB: August 02, 1950 DOA: 09/08/2018 PCP: [name], MD   Brief Narrative: 68 year old male found in parking lot with PEA arrest, unknown downtime require mechanical ventilation, UDS was positive for opioids and alcohol level was 276.  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 Patient could not be extubated and subsequently was trached on 8/17. -Status post PEG tube on 8/19.. -PCCM following and managing vent, sedation, weaning trials. -Transfer to [facility] service on 8/20 first, now off IV fentanyl. [facility] course complicated by agitation and intermittent vomiting. The Social worker consulted and following for disposition LTAC versus SNF    Assessment & Plan:   Active Problems:   Acute respiratory failure (HCC)   Malnutrition of moderate degree   Cardiac arrest (HCC)   Pressure injury of skin   1-Acute hypoxic respiratory failure: -Likely related to accidental opioid/alcohol overdose, in the background of COPD. -Admitted following PEA arrest with unknown downtime -Complicated by severe anoxic encephalopathy and persistent vegetative state. -Failed to wean from ventilator due to anoxic encephalopathy. -Status post tracheostomy on 8/17. -Also treated for pneumococcal pneumonia. -Continue to attempt pressure support trials as tolerated. -Sedation per CCM, continue oxycodone, Lyrica, started Seroquel and Klonopin. -Social work consult for long-term (SNF versus LTAC) -on PRVC.   2-Cardiac arrest: This was likely respiratory arrest from overdose.  3-Dysphagia: -Status post PEG tube 8/19. -Had a multiple intermittent episode of vomiting this weekend, tube feeds had to be held, NG tube was changed to suction. -Finally restarted tube feedings on 8/23, currently tolerating tube feeding at 40 cc/h. -No evidence of ileus on KUB.  -having multiple BM, will dc reglan.   4-severe anoxic encephalopathy: -Evaluated by neurology, earlier this admission EEG and MRI findings suggestive of severe anoxic brain injury. -This was discussed with family, prognosis felt to be very poor. -Repeat EEG 8/20 suggestive of severe encephalopathy as well. -Family wants full aggressive scope of treatment -will decrease oxycodone schedule dose.   5-Pneumococcal pneumonia: -Respiratory culture grew few strep pneumo. -Blood cultures are negative -Initially treated with Zosyn from 8/12 through 8/13 and then Rocephin from 80/15 to 80/17, off antibiotics now.  Hypernatremia:improved on IV fluids, keep fluids for another 24 hours.   Hypokalemia: replaced.   Tachycardia; hydrate   HTN; on Norvasc, carvedilol.   Mild fever;  Pan-culture.  Follow blood culture, urine culture.  UA negative, chest x ray negative.  Send sputum culture.  Monitor for diarrhea.   Goals of care: Per CCM felt to have very poor prognosis with severe anoxic encephalopathy, with no downtime following cardiac arrest, based on EEG and MRI and neurology recommendation. Per discussion have been held with family, they want to proceed with full scope of treatment will need vent in SNF or LTAC  Alcohol abuse: Out of window for withdrawal. Continue with thiamine.  COPD, stable nebulizers as needed  Pressure injury left heel not present on admission. Pressure injury  Pressure Injury 09/24/18 Heel Left Deep Tissue Injury - Purple or maroon localized area of discolored intact skin or blood-filled blister due to damage of underlying soft tissue from pressure and/or shear. (Active)  09/24/18 1115  Location: Heel  Location Orientation: Left  Staging: Deep Tissue Injury - Purple or maroon localized area of discolored intact skin or blood-filled blister due to damage of underlying soft tissue from pressure and/or shear.  Wound Description (Comments):   Present on  Admission: No     Pressure Injury 09/24/18 Right Deep Tissue Injury - Purple or maroon localized area of discolored intact skin or blood-filled blister due to damage of underlying soft tissue from pressure and/or shear. (Active)  09/24/18 1120  Location:   Location Orientation: Right  Staging: Deep Tissue Injury - Purple or maroon localized area of discolored intact skin or blood-filled blister due to damage of underlying soft tissue from pressure and/or shear.  Wound Description (Comments):   Present on Admission: No     Nutrition Problem: Moderate Malnutrition Etiology: social / environmental circumstances(Hx substance abuse)    Signs/Symptoms: moderate muscle depletion, moderate fat depletion    Interventions: Tube feeding  Estimated body mass index is 19.21 kg/m as calculated from the following:   Height as of this encounter: _0  (1.727 m).   Weight as of this encounter: 57.3 kg.   DVT prophylaxis: Lovenox Code Status: Full code Family Communication: Son over phone 8/27 Disposition Plan: We will need SNF versus LTAC Consultants:   CCN following  Procedures:   None  Antimicrobials:  Currently none  Subjective: Open eyes, does not follow command.    Objective: Vitals:   09/26/18 0400 09/26/18 0406 09/26/18 0500 09/26/18 0600  BP: 130/68  137/83 124/80  Pulse: (!) 104  (!) 103 (!) 101  Resp: (!) _1 Temp:      TempSrc:      SpO2: 100%  100% 99%  Weight:  57.3 kg    Height:        Intake/Output Summary (Last 24 hours) at 09/26/2018 0736 Last data filed at 09/26/2018 0600 Gross per 24 hour  Intake 1959.47 ml  Output 1140 ml  Net 819.47 ml   Filed Weights   09/24/18 0424 09/25/18 0500 09/26/18 0406  Weight: 56.8 kg 57.4 kg 57.3 kg    Examination:  General exam: NAD Respiratory system: Trach, on vent, bilateral air movement.  Cardiovascular system: S1, S 2 RRR Gastrointestinal system: BS present, soft, nt, peg in place.  Central  nervous system: alert, not following command.  Extremities: no edema Skin: no rashes.    Data Reviewed: I have personally reviewed following labs and imaging studies  CBC: Recent Labs  Lab 09/21/18 0418 09/23/18 0417 09/24/18 0320 09/25/18 0830 09/26/18 0321  WBC 13.3* 9.4 9.8 10.9* 12.8*  HGB 10.1* 10.9* 11.1* 10.9* 11.3*  HCT 30.9* 33.9* 34.5* 34.3* 34.6*  MCV 86.8 87.4 86.7 87.5 86.9  PLT 316 360 405* 495* 034*   Basic Metabolic Panel: Recent Labs  Lab 09/21/18 0418 09/23/18 0417 09/24/18 0320 09/25/18 0830 09/26/18 0321  NA 139 145 144 149* 146*  K 3.7 3.7 3.8 3.3* 3.9  CL 106 109 110 114* 112*  CO2 _2 GLUCOSE 127* 102* 113* 146* 137*  BUN 23 20 24* 21 22  CREATININE 1.13 1.21 1.22 1.20 1.08  CALCIUM 8.7* 8.9 8.9 8.9 8.9  MG  --   --  2.5*  --   --   PHOS  --   --  2.8  --   --    GFR: Estimated Creatinine Clearance: 54.5 mL/min (by C-G formula based on SCr of 1.08 mg/dL). Liver Function Tests: Recent Labs  Lab 09/21/18 0418  AST 62*  ALT 55*  ALKPHOS 125  BILITOT 1.3*  PROT 6.7  ALBUMIN 2.6*   No results for input(s): LIPASE, AMYLASE in the last 168 hours. Recent Labs  Lab 09/26/18 [phone]  AMMONIA 26   Coagulation Profile: No results for input(s): INR, PROTIME in the last 168 hours. Cardiac Enzymes: No results for input(s): CKTOTAL, CKMB, CKMBINDEX, TROPONINI in the last 168 hours. BNP (last 3 results) No results for input(s): PROBNP in the last 8760 hours. HbA1C: No results for input(s): HGBA1C in the last 72 hours. CBG: Recent Labs  Lab 09/25/18 1129 09/25/18 1516 09/25/18 1923 09/25/18 2345 09/26/18 0408  GLUCAP 146* 130* 133* 133* 130*   Lipid Profile: No results for input(s): CHOL, HDL, LDLCALC, TRIG, CHOLHDL, LDLDIRECT in the last 72 hours. Thyroid Function Tests: No results for input(s): TSH, T4TOTAL, FREET4, T3FREE, THYROIDAB in the last 72 hours. Anemia Panel: No results for input(s): VITAMINB12, FOLATE,  FERRITIN, TIBC, IRON, RETICCTPCT in the last 72 hours. Sepsis Labs: No results for input(s): PROCALCITON, LATICACIDVEN in the last 168 hours.  Recent Results (from the past 240 hour(s))  SARS Coronavirus 2 [facility] order, Performed in [facility] hospital lab)     Status: None   Collection Time: 09/17/18  9:15 AM  Result Value Ref Range Status   SARS Coronavirus 2 NEGATIVE NEGATIVE Final    Comment: (NOTE) If result is NEGATIVE SARS-CoV-2 target nucleic acids are NOT DETECTED. The SARS-CoV-2 RNA is generally detectable in upper and lower  respiratory specimens during the acute phase of infection. The lowest  concentration of SARS-CoV-2 viral copies this assay can detect is 250  copies / mL. A negative result does not preclude SARS-CoV-2 infection  and should not be used as the sole basis for treatment or other  patient management decisions.  A negative result may occur with  improper specimen collection / handling, submission of specimen other  than nasopharyngeal swab, presence of viral mutation(s) within the  areas targeted by this assay, and inadequate number of viral copies  (<250 copies / mL). A negative result must be combined with clinical  observations, patient history, and epidemiological information. If result is POSITIVE SARS-CoV-2 target nucleic acids are DETECTED. The SARS-CoV-2 RNA is generally detectable in upper and lower  respiratory specimens dur ing the acute phase of infection.  Positive  results are indicative of active infection with SARS-CoV-2.  Clinical  correlation with patient history and other diagnostic information is  necessary to determine patient infection status.  Positive results do  not rule out bacterial infection or co-infection with other viruses. If result is PRESUMPTIVE POSTIVE SARS-CoV-2 nucleic acids MAY BE PRESENT.   A presumptive positive result was obtained on the submitted specimen  and confirmed on repeat testing.  While 2019 novel  coronavirus  (SARS-CoV-2) nucleic acids may be present in the submitted sample  additional confirmatory testing may be necessary for epidemiological  and / or clinical management purposes  to differentiate between  SARS-CoV-2 and other Sarbecovirus currently known to infect humans.  If clinically indicated additional testing with an alternate test  methodology [phone]) is advised. The SARS-CoV-2 RNA is generally  detectable in upper and lower respiratory sp ecimens during the acute  phase of infection. The expected result is Negative. Fact Sheet for Patients:  StrictlyIdeas.no Fact Sheet for Healthcare Providers: BankingDealers.co.za This test is not yet approved or cleared by the [place] FDA and has been authorized for detection and/or diagnosis of SARS-CoV-2 by FDA under an Emergency Use Authorization (EUA).  This EUA will remain in effect (meaning this test can be used) for the duration of the COVID-19 declaration under Section 564(b)(1) of the Act, 21 U.S.C. section 360bbb-3(b)(1), unless the authorization  is terminated or revoked sooner. Performed at [facility], [place] [address], [place], [place] [address]   Culture, blood (routine x 2)     Status: None   Collection Time: 09/18/18 10:29 PM   Specimen: BLOOD LEFT FOREARM  Result Value Ref Range Status   Specimen Description BLOOD LEFT FOREARM  Final   Special Requests   Final    BOTTLES DRAWN AEROBIC AND ANAEROBIC Blood Culture results may not be optimal due to an inadequate volume of blood received in culture bottles   Culture   Final    NO GROWTH 5 DAYS Performed at [facility], [place] [address] [place], [place] [address]    Report Status 09/23/2018 FINAL  Final  Culture, blood (routine x 2)     Status: None   Collection Time: 09/18/18 10:33 PM   Specimen: BLOOD LEFT HAND  Result Value Ref Range Status   Specimen Description BLOOD LEFT HAND  Final    Special Requests   Final    BOTTLES DRAWN AEROBIC AND ANAEROBIC Blood Culture results may not be optimal due to an inadequate volume of blood received in culture bottles   Culture   Final    NO GROWTH 5 DAYS Performed at [facility], [place] [address], [place], [place] [address]    Report Status 09/23/2018 FINAL  Final         Radiology Studies: Dg Chest Port 1 View  Result Date: 09/25/2018 CLINICAL DATA:  Fever. EXAM: PORTABLE CHEST 1 VIEW COMPARISON:  Chest x-ray dated September 19, 2018. FINDINGS: Unchanged tracheostomy tube with tip at the thoracic inlet. The heart size and mediastinal contours are within normal limits. Normal pulmonary vascularity. No focal consolidation, pleural effusion, or pneumothorax. No acute osseous abnormality. IMPRESSION: No active disease. Electronically Signed   By: Titus Dubin M.D.   On: 09/25/2018 20:47        Scheduled Meds: . acetaminophen  500 mg Per Tube Q6H  . amLODipine  5 mg Per Tube Daily  . carvedilol  6.25 mg Per Tube BID WC  . chlorhexidine gluconate (MEDLINE KIT)  15 mL Mouth Rinse BID  . Chlorhexidine Gluconate Cloth  6 each Topical Q0600  . clonazePAM  1 mg Per Tube TID  . enoxaparin (LOVENOX) injection  40 mg Subcutaneous Q24H  . feeding supplement (JEVITY 1.2 CAL)  1,000 mL Per Tube Q24H  . feeding supplement (PRO-STAT SUGAR FREE 64)  30 mL Per Tube TID  . folic acid  1 mg Per Tube Daily  . insulin aspart  2-6 Units Subcutaneous Q4H  . mouth rinse  15 mL Mouth Rinse 10 times per day  . metoCLOPramide (REGLAN) injection  5 mg Intravenous Q6H  . multivitamin  15 mL Per Tube Daily  . oxyCODONE  10 mg Per Tube Q6H  . pantoprazole sodium  40 mg Per Tube Q24H  . pregabalin  25 mg Per Tube BID  . QUEtiapine  25 mg Per Tube BID  . thiamine  100 mg Per Tube Daily   Continuous Infusions: . sodium chloride    . dextrose 50 mL/hr at 09/26/18 0600     LOS: 18 days    Time spent: 35 minutes    Elmarie Shiley, MD  [facility] Pager [phone]  If 7PM-7AM, please contact night-coverage [email or web address] Password [facility] 09/26/2018, 7:36 AM

## 2018-09-27 DIAGNOSIS — Z930 Tracheostomy status: Secondary | ICD-10-CM

## 2018-09-27 LAB — CBC
HCT: 31.7 % — ABNORMAL LOW (ref 39.0–52.0)
Hemoglobin: 10 g/dL — ABNORMAL LOW (ref 13.0–17.0)
MCH: 27.6 pg (ref 26.0–34.0)
MCHC: 31.5 g/dL (ref 30.0–36.0)
MCV: 87.6 fL (ref 80.0–100.0)
Platelets: 463 10*3/uL — ABNORMAL HIGH (ref 150–400)
RBC: 3.62 MIL/uL — ABNORMAL LOW (ref 4.22–5.81)
RDW: 12.9 % (ref 11.5–15.5)
WBC: 10.4 10*3/uL (ref 4.0–10.5)
nRBC: 0 % (ref 0.0–0.2)

## 2018-09-27 LAB — URINE CULTURE: Culture: 40000 — AB

## 2018-09-27 LAB — GLUCOSE, CAPILLARY
Glucose-Capillary: 104 mg/dL — ABNORMAL HIGH (ref 70–99)
Glucose-Capillary: 105 mg/dL — ABNORMAL HIGH (ref 70–99)
Glucose-Capillary: 110 mg/dL — ABNORMAL HIGH (ref 70–99)
Glucose-Capillary: 117 mg/dL — ABNORMAL HIGH (ref 70–99)
Glucose-Capillary: 118 mg/dL — ABNORMAL HIGH (ref 70–99)
Glucose-Capillary: 122 mg/dL — ABNORMAL HIGH (ref 70–99)

## 2018-09-27 LAB — MAGNESIUM: Magnesium: 2 mg/dL (ref 1.7–2.4)

## 2018-09-27 LAB — BASIC METABOLIC PANEL
Anion gap: 9 (ref 5–15)
BUN: 19 mg/dL (ref 8–23)
CO2: 25 mmol/L (ref 22–32)
Calcium: 8.7 mg/dL — ABNORMAL LOW (ref 8.9–10.3)
Chloride: 107 mmol/L (ref 98–111)
Creatinine, Ser: 0.99 mg/dL (ref 0.61–1.24)
GFR calc Af Amer: >60 mL/min (ref >60)
GFR calc non Af Amer: >60 mL/min (ref >60)
Glucose, Bld: 128 mg/dL — ABNORMAL HIGH (ref 70–99)
Potassium: 3.8 mmol/L (ref 3.5–5.1)
Sodium: 141 mmol/L (ref 135–145)

## 2018-09-27 MED ORDER — FREE WATER
150.0000 mL | Freq: Four times a day (QID) | Status: DC
  Administered 2018-09-27 – 2018-10-26 (×113): 150 mL

## 2018-09-27 MED ORDER — POTASSIUM CHLORIDE 20 MEQ/15ML (10%) PO SOLN: 40.0000 meq | Freq: Once | ORAL | Status: DC

## 2018-09-27 MED ORDER — POTASSIUM CHLORIDE 20 MEQ/15ML (10%) PO SOLN
40.0000 meq | Freq: Once | ORAL | Status: AC
  Administered 2018-09-27: 11:00:00 40 meq
  Filled 2018-09-27: qty 30

## 2018-09-27 MED ORDER — SODIUM CHLORIDE 0.9 % IV SOLN
2.0000 g | Freq: Three times a day (TID) | INTRAVENOUS | Status: DC
  Administered 2018-09-27 – 2018-09-30 (×9): 2 g via INTRAVENOUS
  Filled 2018-09-27 (×9): qty 2

## 2018-09-27 NOTE — Progress Notes (Signed)
Pharmacy Antibiotic Note  Micheal Carey is a 68 y.o. male admitted on 09/08/2018 with GNR pneumonia.  Pharmacy has been consulted for Cefepime dosing. Tmax 100.7. WBC 10.4. Respiratory culture with GNR - sensitivities and susceptibilities pending.   Plan: Cefepime 2g IV every 8 hour.  Monitor renal function, culture results, and clinical status.   Height: 5\' 8"  (172.7 cm) Weight: 134 lb 0.6 oz (60.8 kg) IBW/kg (Calculated) : 68.4  Temp (24hrs), Avg:99.2 F (37.3 C), Min:98.3 F (36.8 C), Max:100.7 F (38.2 C)  Recent Labs  Lab 09/23/18 0417 09/24/18 0320 09/25/18 0830 09/26/18 0321 09/27/18 0245  WBC 9.4 9.8 10.9* 12.8* 10.4  CREATININE 1.21 1.22 1.20 1.08 0.99    Estimated Creatinine Clearance: 63.1 mL/min (by C-G formula based on SCr of 0.99 mg/dL).    No Known Allergies  Antimicrobials this admission: Cefepime 8/28 >>  Dose adjustments this admission:   Microbiology results: 8/26: Blood culture: ngtd 8/27: Urine culture: Sent 8/27: TA: GNR   Thank you for allowing pharmacy to be a part of this patient's care.  Sloan Leiter, PharmD, BCPS, BCCCP Clinical Pharmacist Please refer to [facility] for [place] numbers 09/27/2018 11:33 AM

## 2018-09-27 NOTE — Progress Notes (Addendum)
2pm: Additional attempt was made to contact staff in admissions at [facility] without success, CSW left additional voicemail for a return call.  11am: Additional attempt was made to contact staff in admissions at [facility] without success.  10am: CSW received quote information and forwarded it to the patient's daughter Chy'vonne for review.  9am: CSW attempted to reach admissions at [facility] without success, left voicemail requesting a return call.  CSW spoke with Pleas Patricia at [employer] to obtain a quote for a possible transfer to a facility in [place] per family's request.  Madilyn Fireman, MSW, LCSW-A Clinical Social Worker Transitions of Care Department [facility] Emergency Department [phone]

## 2018-09-27 NOTE — Progress Notes (Addendum)
NAME:  Micheal Carey, MRN:  [id number], DOB:  Jul 29, 1950, LOS: 19 ADMISSION DATE:  09/08/2018, CONSULTATION DATE:  09/08/2018 REFERRING MD:  Dr. Silverio LayYao, ER, CHIEF COMPLAINT:  AMS   Brief History   68 yo male found in parking lot with PEA arrest with unknown downtime.  Required intubation.  UDS positive for opiates and ETOH 276.  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  Past Medical History  COPD, ETOH, opiate abuse  Significant Hospital Events   8/09 Admit 8/11 Fever 102.7  Consults:  Neurology  Procedures:  ETT 8/09 >>  Significant Diagnostic Tests:  Echo 8/10 >> EF 60 to 65%, mild MR EEG 8/12 >> profound, severe encephalopathy MRI brain 8/12 >> hypoxic/ischemic injury, moderate chronic small vessel ischemic disease  Micro Data:  COVID 8/09 >> negative Blood 8/09 >> negative Blood 8/12 >>NTD Sputum 8/12 >> Strep pneumoniae  Antimicrobials:  Vancomycin 8/11  Zosyn 8/11 >> 8/13 Rocephin 8/15 >> 8/17  Interim history/subjective:  Continued agitation overnight Failing weaning this AM T max 100.7 Fentanyl prn and oxy scheduled and prn Failed wean attempt 8/28 with low volumes  Objective   Blood pressure 130/74, pulse 94, temperature 99.2 F (37.3 C), temperature source Axillary, resp. rate 16, height 5\' 8"  (1.727 m), weight 60.8 kg, SpO2 99 %.    Vent Mode: PRVC FiO2 (%):  [30 %] 30 % Set Rate:  [12 bmp] 12 bmp Vt Set:  [540 mL] 540 mL PEEP:  [5 cmH20] 5 cmH20 Plateau Pressure:  [17 cmH20-27 cmH20] 17 cmH20   Intake/Output Summary (Last 24 hours) at 09/27/2018 1048 Last data filed at 09/27/2018 1000 Gross per 24 hour  Intake 2694.19 ml  Output 800 ml  Net 1894.19 ml   Filed Weights   09/25/18 0500 09/26/18 0406 09/27/18 0420  Weight: 57.4 kg 57.3 kg 60.8 kg   Examination:  General - Chronically ill appearing male, trached and vented, NAD HEENT - [place]/AT, PERRL, EOM-I and MMM, trach secure and  in place Cardiac -  S1, S2, RRR,  and  -M/R/G Chest - Bilateral chest excursion, Coarse BS diffusely, diminished per bases Abdomen - Soft, NT, ND and +BS, PEG is CDI Extremities - -edema and -tenderness, brisk cap refill Skin - no rashes, no lesions, warm dry and intact Neuro - doesn't follow commands    Resolved Hospital Problem list     Assessment & Plan:   Acute hypoxic respiratory failure from accidental opiate/ETOH overdose. Hx of COPD. Failure to wean from ventilator due to mental status. - Reattempt weaning on PS trials, daily - F/u CXR intermittently - PRN BDs - Titrate O2 for sat of 88-92%  Pneumococcal pneumonia. Few Gram negative rods  per respiratory culture T Max  100.7 - Will start Cefepime per pharmacy to cover gram - until final culture results - trend WBC and fever curve - re-culture as is clinically indicated  Acute anoxic encephalopathy with persistent vegetative state. - RASS goal 0 - D/C precedex and fentanyl drip - Continue  clonazepam 1 mg TID - Continue Seroquel 25 BID QTc 0.36 - Oxy at 10 mg PO q6, if remains agitated then will increase  PEA cardiac arrest after opiate/ETOH induced respiratory failure. + 3900 cc's Hemodynamically stable at present - Monitor hemodynamics - MAP goal > 65  Hypernatremia. (dropped to 141 overnight) Hypokalemia. - F/u BMET - Will discontinue D5W and add free water 150 cc Q 6 - KVO IVF - Trend  - Replace electrolytes as indicated  Moderate protein calorie malnutrition. Vomiting episode 8/15. PEG - Fube feeds - PRN zofran  Thrush. - Nystatin as ordered stop after 8/25 dose  Family are investigating transfer to facility in [place] at families request. They are obtaining a quote for cost of transfer through [employer] will continue to follow twice weekly for vent and trach  Best practice:  Diet: tube feeds DVT prophylaxis: lovenox GI prophylaxis: protonix Mobility: bed rest Code Status: full code  Disposition: ICU  Labs    CMP Latest Ref Rng & Units 09/27/2018 09/26/2018 09/25/2018  Glucose 70 - 99 mg/dL 128(H) 137(H) 146(H)  BUN 8 - 23 mg/dL 19 22 21   Creatinine 0.61 - 1.24 mg/dL 0.99 1.08 1.20  Sodium 135 - 145 mmol/L 141 146(H) 149(H)  Potassium 3.5 - 5.1 mmol/L 3.8 3.9 3.3(L)  Chloride 98 - 111 mmol/L 107 112(H) 114(H)  CO2 22 - 32 mmol/L 25 24 24   Calcium 8.9 - 10.3 mg/dL 8.7(L) 8.9 8.9  Total Protein 6.5 - 8.1 g/dL - - -  Total Bilirubin 0.3 - 1.2 mg/dL - - -  Alkaline Phos 38 - 126 U/L - - -  AST 15 - 41 U/L - - -  ALT 0 - 44 U/L - - -   CBC Latest Ref Rng & Units 09/27/2018 09/26/2018 09/25/2018  WBC 4.0 - 10.5 K/uL 10.4 12.8(H) 10.9(H)  Hemoglobin 13.0 - 17.0 g/dL 10.0(L) 11.3(L) 10.9(L)  Hematocrit 39.0 - 52.0 % 31.7(L) 34.6(L) 34.3(L)  Platelets 150 - 400 K/uL 463(H) 444(H) 495(H)   CBG (last 3)  Recent Labs    09/26/18 2331 09/27/18 0311 09/27/18 0724  GLUCAP 121* 110* 122*   ABG    Component Value Date/Time   PHART 7.506 (H) 09/10/2018 0359   PCO2ART 32.5 09/10/2018 0359   PO2ART 177.0 (H) 09/10/2018 0359   HCO3 25.7 09/10/2018 0359   TCO2 27 09/10/2018 0359   ACIDBASEDEF 1.0 09/09/2018 0427   O2SAT 100.0 09/10/2018 0359   30 minutes PCCM APP time  Magdalen Spatz, AGACNP-BC [place]. Pager: [phone] After hours pager: [phone]. 09/27/2018 11:24 AM  Attending Note:  Agree with above, plans as above.  Patient seen and examined, agree with above note.  I dictated the care and orders written for this patient under my direction.  Rush Farmer, [place]

## 2018-09-27 NOTE — Progress Notes (Signed)
PROGRESS NOTE    Micheal Carey  XYI:[id number] DOB: 06-Apr-1950 DOA: 09/08/2018 PCP: [name], MD   Brief Narrative: 68 year old male found in parking lot with PEA arrest, unknown downtime require mechanical ventilation, UDS was positive for opioids and alcohol level was 276.  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 Patient could not be extubated and subsequently was trached on 8/17. -Status post PEG tube on 8/19.. -PCCM following and managing vent, sedation, weaning trials. -Transfer to [facility] service on 8/20 first, now off IV fentanyl. [facility] course complicated by agitation and intermittent vomiting. The Social worker consulted and following for disposition LTAC versus SNF    Assessment & Plan:   Active Problems:   Acute respiratory failure (HCC)   Malnutrition of moderate degree   Cardiac arrest (HCC)   Pressure injury of skin   1-Acute hypoxic respiratory failure: -Likely related to accidental opioid/alcohol overdose, in the background of COPD. -Admitted following PEA arrest with unknown downtime -Complicated by severe anoxic encephalopathy and persistent vegetative state. -Failed to wean from ventilator due to anoxic encephalopathy. -Status post tracheostomy on 8/17. -Also treated for pneumococcal pneumonia. -Continue to attempt pressure support trials as tolerated. -Sedation per CCM, continue oxycodone, Lyrica, started Seroquel and Klonopin. -Social work consult for long-term (SNF versus LTAC) -CCM will follow up on patient today for vent management.   2-PNA; Mild fever; respiratory culture growing few gram negative rods.  Follow blood culture no growth to dated. , urine culture 40,000 multiple bacterial morphotype.   Send sputum culture. Growing gram negative rods.  Started on cefepime 8/28.  Diarrhea improved.    3-Dysphagia: -Status post PEG tube 8/19.  -Had a multiple intermittent episode of vomiting this weekend, tube feeds had to be held, NG tube was changed to suction. -Finally restarted tube feedings on 8/23, currently tolerating tube feeding at 40 cc/h. -No evidence of ileus on KUB. -Having multiple BM, dc reglan.  -Diarrhea improved.   4-severe anoxic encephalopathy: -Evaluated by neurology, earlier this admission EEG and MRI findings suggestive of severe anoxic brain injury. -This was discussed with family, prognosis felt to be very poor. -Repeat EEG 8/20 suggestive of severe encephalopathy as well. -Family wants full aggressive scope of treatment -oxycodone reduce to 5 mg on 8/27, if continue to be agitated might need to resume prior dose.   5-Pneumococcal pneumonia: -Respiratory culture grew few strep pneumo. -Blood cultures are negative -Initially treated with Zosyn from 8/12 through 8/13 and then Rocephin from 80/15 to 80/17, off antibiotics now. -treated, develops fever, respiratory culture growing gram negative rods. Started on cefepime 8/28.  Hypernatremia:resolved with D 5. IV fluids discontinue.   Hypokalemia: replaced.   Tachycardia; hydrate   -Cardiac arrest: This was likely respiratory arrest from overdose.  HTN; on Norvasc, carvedilol.    Goals of care: Per CCM felt to have very poor prognosis with severe anoxic encephalopathy, with no downtime following cardiac arrest, based on EEG and MRI and neurology recommendation. Per discussion have been held with family, they want to proceed with full scope of treatment will need vent in SNF or LTAC  Alcohol abuse: Out of window for withdrawal. Continue with thiamine.  COPD, stable nebulizers as needed  Pressure injury left heel not present on admission. Pressure injury  Pressure Injury 09/24/18 Heel Left Deep Tissue Injury - Purple or maroon localized area of discolored intact skin or blood-filled blister due to damage of underlying soft tissue  from pressure  and/or shear. (Active)  09/24/18 1115  Location: Heel  Location Orientation: Left  Staging: Deep Tissue Injury - Purple or maroon localized area of discolored intact skin or blood-filled blister due to damage of underlying soft tissue from pressure and/or shear.  Wound Description (Comments):   Present on Admission: No     Pressure Injury 09/24/18 Right Deep Tissue Injury - Purple or maroon localized area of discolored intact skin or blood-filled blister due to damage of underlying soft tissue from pressure and/or shear. (Active)  09/24/18 1120  Location:   Location Orientation: Right  Staging: Deep Tissue Injury - Purple or maroon localized area of discolored intact skin or blood-filled blister due to damage of underlying soft tissue from pressure and/or shear.  Wound Description (Comments):   Present on Admission: No     Nutrition Problem: Moderate Malnutrition Etiology: social / environmental circumstances(Hx substance abuse)    Signs/Symptoms: moderate muscle depletion, moderate fat depletion    Interventions: Tube feeding  Estimated body mass index is 20.38 kg/m as calculated from the following:   Height as of this encounter: '5\' 8"'  (1.727 m).   Weight as of this encounter: 60.8 kg.   DVT prophylaxis: Lovenox Code Status: Full code Family Communication: Son over phone 8/27 Disposition Plan: We will need SNF versus LTAC Consultants:   CCN following  Procedures:   None  Antimicrobials:  Currently none  Subjective: Open eyes. Per nurse staff patient has been more agitated   Objective: Vitals:   09/27/18 1000 09/27/18 1100 09/27/18 1114 09/27/18 1200  BP: 130/74 121/67 121/67 136/72  Pulse: 94 96 95 96  Resp: '16 18 12 15  ' Temp:    99.8 F (37.7 C)  TempSrc:    Axillary  SpO2: 99% 99% 98% 100%  Weight:      Height:        Intake/Output Summary (Last 24 hours) at 09/27/2018 1331 Last data filed at 09/27/2018 1229 Gross per 24 hour  Intake 2637.46 ml   Output 925 ml  Net 1712.46 ml   Filed Weights   09/25/18 0500 09/26/18 0406 09/27/18 0420  Weight: 57.4 kg 57.3 kg 60.8 kg    Examination:  General exam: NAD Respiratory system: Trach, vent, bilateral ronchus Cardiovascular system: S 1, S 2 RRR Gastrointestinal system: BS present, soft, nt, peg tube in place Central nervous system: alert Extremities: No edema Skin: no rashes.    Data Reviewed: I have personally reviewed following labs and imaging studies  CBC: Recent Labs  Lab 09/23/18 0417 09/24/18 0320 09/25/18 0830 09/26/18 0321 09/27/18 0245  WBC 9.4 9.8 10.9* 12.8* 10.4  HGB 10.9* 11.1* 10.9* 11.3* 10.0*  HCT 33.9* 34.5* 34.3* 34.6* 31.7*  MCV 87.4 86.7 87.5 86.9 87.6  PLT 360 405* 495* 444* 358*   Basic Metabolic Panel: Recent Labs  Lab 09/23/18 0417 09/24/18 0320 09/25/18 0830 09/26/18 0321 09/27/18 0245  NA 145 144 149* 146* 141  K 3.7 3.8 3.3* 3.9 3.8  CL 109 110 114* 112* 107  CO2 '23 24 24 24 25  ' GLUCOSE 102* 113* 146* 137* 128*  BUN 20 24* '21 22 19  ' CREATININE 1.21 1.22 1.20 1.08 0.99  CALCIUM 8.9 8.9 8.9 8.9 8.7*  MG  --  2.5*  --   --  2.0  PHOS  --  2.8  --   --   --    GFR: Estimated Creatinine Clearance: 63.1 mL/min (by C-G formula based on SCr of 0.99 mg/dL). Liver  Function Tests: Recent Labs  Lab 09/21/18 0418  AST 62*  ALT 55*  ALKPHOS 125  BILITOT 1.3*  PROT 6.7  ALBUMIN 2.6*   No results for input(s): LIPASE, AMYLASE in the last 168 hours. Recent Labs  Lab 09/26/18 0316  AMMONIA 26   Coagulation Profile: No results for input(s): INR, PROTIME in the last 168 hours. Cardiac Enzymes: No results for input(s): CKTOTAL, CKMB, CKMBINDEX, TROPONINI in the last 168 hours. BNP (last 3 results) No results for input(s): PROBNP in the last 8760 hours. HbA1C: No results for input(s): HGBA1C in the last 72 hours. CBG: Recent Labs  Lab 09/26/18 1920 09/26/18 2331 09/27/18 0311 09/27/18 0724 09/27/18 1128  GLUCAP 116* 121*  110* 122* 117*   Lipid Profile: No results for input(s): CHOL, HDL, LDLCALC, TRIG, CHOLHDL, LDLDIRECT in the last 72 hours. Thyroid Function Tests: No results for input(s): TSH, T4TOTAL, FREET4, T3FREE, THYROIDAB in the last 72 hours. Anemia Panel: No results for input(s): VITAMINB12, FOLATE, FERRITIN, TIBC, IRON, RETICCTPCT in the last 72 hours. Sepsis Labs: No results for input(s): PROCALCITON, LATICACIDVEN in the last 168 hours.  Recent Results (from the past 240 hour(s))  Culture, blood (routine x 2)     Status: None   Collection Time: 09/18/18 10:29 PM   Specimen: BLOOD LEFT FOREARM  Result Value Ref Range Status   Specimen Description BLOOD LEFT FOREARM  Final   Special Requests   Final    BOTTLES DRAWN AEROBIC AND ANAEROBIC Blood Culture results may not be optimal due to an inadequate volume of blood received in culture bottles   Culture   Final    NO GROWTH 5 DAYS Performed at [facility], [place] [address] [place] [address], [place], [place] [address]    Report Status 09/23/2018 FINAL  Final  Culture, blood (routine x 2)     Status: None   Collection Time: 09/18/18 10:33 PM   Specimen: BLOOD LEFT HAND  Result Value Ref Range Status   Specimen Description BLOOD LEFT HAND  Final   Special Requests   Final    BOTTLES DRAWN AEROBIC AND ANAEROBIC Blood Culture results may not be optimal due to an inadequate volume of blood received in culture bottles   Culture   Final    NO GROWTH 5 DAYS Performed at [facility], [place] [address] [place], [place] [address]    Report Status 09/23/2018 FINAL  Final  Culture, blood (routine x 2)     Status: None (Preliminary result)   Collection Time: 09/25/18  9:11 PM   Specimen: BLOOD  Result Value Ref Range Status   Specimen Description BLOOD LEFT HAND  Final   Special Requests   Final    BOTTLES DRAWN AEROBIC ONLY Blood Culture adequate volume   Culture   Final    NO GROWTH < 24 HOURS Performed at [facility], [place] [address]  [place], [place] [address]    Report Status PENDING  Incomplete  Culture, blood (routine x 2)     Status: None (Preliminary result)   Collection Time: 09/25/18  9:16 PM   Specimen: BLOOD  Result Value Ref Range Status   Specimen Description BLOOD LEFT HAND  Final   Special Requests   Final    BOTTLES DRAWN AEROBIC ONLY Blood Culture adequate volume   Culture   Final    NO GROWTH < 24 HOURS Performed at [facility], [place] [address], [place], [place] [address]  Report Status PENDING  Incomplete  Culture, respiratory (non-expectorated)     Status: None (Preliminary result)   Collection Time: 09/26/18  9:01 AM   Specimen: Tracheal Aspirate; Respiratory  Result Value Ref Range Status   Specimen Description TRACHEAL ASPIRATE  Final   Special Requests NONE  Final   Gram Stain   Final    FEW WBC PRESENT, PREDOMINANTLY PMN RARE GRAM POSITIVE COCCI RARE GRAM NEGATIVE RODS Performed at [facility], [place] [address], [place], [place] [address]    Culture FEW GRAM NEGATIVE RODS  Final   Report Status PENDING  Incomplete  Urine Culture     Status: Abnormal   Collection Time: 09/26/18 12:30 PM   Specimen: Urine, Random  Result Value Ref Range Status   Specimen Description URINE, RANDOM  Final   Special Requests   Final    NONE Performed at [facility], [place] [address], [place],  [address]    Culture (A)  Final    40,000 COLONIES/mL MULTIPLE SPECIES PRESENT, SUGGEST RECOLLECTION   Report Status 09/27/2018 FINAL  Final         Radiology Studies: Dg Chest Port 1 View  Result Date: 09/25/2018 CLINICAL DATA:  Fever. EXAM: PORTABLE CHEST 1 VIEW COMPARISON:  Chest x-ray dated September 19, 2018. FINDINGS: Unchanged tracheostomy tube with tip at the thoracic inlet. The heart size and mediastinal contours are within normal limits. Normal pulmonary vascularity. No focal consolidation, pleural effusion, or pneumothorax. No acute osseous abnormality. IMPRESSION: No active  disease. Electronically Signed   By: Titus Dubin M.D.   On: 09/25/2018 20:47        Scheduled Meds: . amLODipine  5 mg Per Tube Daily  . carvedilol  6.25 mg Per Tube BID WC  . chlorhexidine gluconate (MEDLINE KIT)  15 mL Mouth Rinse BID  . Chlorhexidine Gluconate Cloth  6 each Topical Q0600  . clonazePAM  1 mg Per Tube TID  . enoxaparin (LOVENOX) injection  40 mg Subcutaneous Q24H  . feeding supplement (JEVITY 1.2 CAL)  1,000 mL Per Tube Q24H  . feeding supplement (PRO-STAT SUGAR FREE 64)  30 mL Per Tube TID  . folic acid  1 mg Per Tube Daily  . free water  150 mL Per Tube Q6H  . insulin aspart  2-6 Units Subcutaneous Q4H  . mouth rinse  15 mL Mouth Rinse 10 times per day  . multivitamin  15 mL Per Tube Daily  . oxyCODONE  5 mg Per Tube Q6H  . pantoprazole sodium  40 mg Per Tube Q24H  . pregabalin  25 mg Per Tube BID  . QUEtiapine  25 mg Per Tube BID  . thiamine  100 mg Per Tube Daily   Continuous Infusions: . sodium chloride 250 mL (09/27/18 1228)  . ceFEPime (MAXIPIME) IV 2 g (09/27/18 1229)     LOS: 19 days    Time spent: 35 minutes    Elmarie Shiley, MD [facility] Pager [phone]  If 7PM-7AM, please contact night-coverage [email or web address] Password [email or web address] 09/27/2018, 1:31 PM

## 2018-09-28 LAB — BASIC METABOLIC PANEL
Anion gap: 9 (ref 5–15)
BUN: 17 mg/dL (ref 8–23)
CO2: 23 mmol/L (ref 22–32)
Calcium: 8.7 mg/dL — ABNORMAL LOW (ref 8.9–10.3)
Chloride: 106 mmol/L (ref 98–111)
Creatinine, Ser: 0.93 mg/dL (ref 0.61–1.24)
GFR calc Af Amer: >60 mL/min (ref >60)
GFR calc non Af Amer: >60 mL/min (ref >60)
Glucose, Bld: 125 mg/dL — ABNORMAL HIGH (ref 70–99)
Potassium: 4.2 mmol/L (ref 3.5–5.1)
Sodium: 138 mmol/L (ref 135–145)

## 2018-09-28 LAB — CBC
HCT: 29.2 % — ABNORMAL LOW (ref 39.0–52.0)
Hemoglobin: 9.6 g/dL — ABNORMAL LOW (ref 13.0–17.0)
MCH: 28.1 pg (ref 26.0–34.0)
MCHC: 32.9 g/dL (ref 30.0–36.0)
MCV: 85.4 fL (ref 80.0–100.0)
Platelets: 410 10*3/uL — ABNORMAL HIGH (ref 150–400)
RBC: 3.42 MIL/uL — ABNORMAL LOW (ref 4.22–5.81)
RDW: 12.8 % (ref 11.5–15.5)
WBC: 9 10*3/uL (ref 4.0–10.5)
nRBC: 0 % (ref 0.0–0.2)

## 2018-09-28 LAB — GLUCOSE, CAPILLARY
Glucose-Capillary: 120 mg/dL — ABNORMAL HIGH (ref 70–99)
Glucose-Capillary: 104 mg/dL — ABNORMAL HIGH (ref 70–99)
Glucose-Capillary: 140 mg/dL — ABNORMAL HIGH (ref 70–99)
Glucose-Capillary: 108 mg/dL — ABNORMAL HIGH (ref 70–99)
Glucose-Capillary: 131 mg/dL — ABNORMAL HIGH (ref 70–99)
Glucose-Capillary: 104 mg/dL — ABNORMAL HIGH (ref 70–99)

## 2018-09-28 MED ORDER — OXYCODONE HCL 5 MG/5ML PO SOLN
10.0000 mg | Freq: Four times a day (QID) | ORAL | Status: DC
  Administered 2018-09-28 – 2018-10-17 (×77): 10 mg
  Filled 2018-09-28 (×77): qty 10

## 2018-09-28 MED ORDER — SACCHAROMYCES BOULARDII 250 MG PO CAPS: 250.0000 mg | ORAL_CAPSULE | Freq: Two times a day (BID) | ORAL | Status: DC

## 2018-09-28 NOTE — Progress Notes (Signed)
PROGRESS NOTE    Micheal Carey  AES:[id number] DOB: 01/14/1951 DOA: 09/08/2018 PCP: [name], MD   Brief Narrative: 68 year old male found in parking lot with PEA arrest, unknown downtime require mechanical ventilation, UDS was positive for opioids and alcohol level was 276.  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 Patient could not be extubated and subsequently was trached on 8/17. -Status post PEG tube on 8/19.. -PCCM following and managing vent, sedation, weaning trials. -Transfer to [facility] service on 8/20 first, now off IV fentanyl. [facility] course complicated by agitation and intermittent vomiting. The Social worker consulted and following for disposition LTAC versus SNF    Assessment & Plan:   Active Problems:   Acute respiratory failure (HCC)   Malnutrition of moderate degree   Cardiac arrest (HCC)   Pressure injury of skin   Status post tracheostomy ([place])   1-Acute hypoxic respiratory failure: -Likely related to accidental opioid/alcohol overdose, in the background of COPD. -Admitted following PEA arrest with unknown downtime -Complicated by severe anoxic encephalopathy and persistent vegetative state. -Failed to wean from ventilator due to anoxic encephalopathy. -Status post tracheostomy on 8/17. -Also treated for pneumococcal pneumonia. -Continue to attempt pressure support trials as tolerated. -Sedation per CCM, continue oxycodone, Lyrica, started Seroquel and Klonopin. -Social work consult for long-term (SNF versus LTAC) CCM following for vent management.   2-PNA; Mild fever; respiratory culture growing few gram negative rods.  Follow blood culture no growth to dated. , urine culture 40,000 multiple bacterial morphotype.   Send sputum culture. Growing acinetobacter.  Started on cefepime 8/28. Continue.  Diarrhea resolved.  Continue with IV antibiotics,  had fever 101 on 8/28.  3-Dysphagia: -Status post PEG tube 8/19. -Had a multiple intermittent episode of vomiting this weekend, tube feeds had to be held, NG tube was changed to suction. -Finally restarted tube feedings on 8/23, currently tolerating tube feeding at 40 cc/h. -No evidence of ileus on KUB. -Having multiple BM, dc reglan.  -Diarrhea improved.   4-severe anoxic encephalopathy: -Evaluated by neurology, earlier this admission EEG and MRI findings suggestive of severe anoxic brain injury. -This was discussed with family, prognosis felt to be very poor. -Repeat EEG 8/20 suggestive of severe encephalopathy as well. -Family wants full aggressive scope of treatment -he has been restless, increase oxycodone back to 10 mg.   5-Pneumococcal pneumonia: -Respiratory culture grew few strep pneumo. -Blood cultures are negative -Initially treated with Zosyn from 8/12 through 8/13 and then Rocephin from 80/15 to 80/17, off antibiotics now. -treated, develops fever, respiratory culture growing gram negative rods. Started on cefepime 8/28.  Hypernatremia:resolved with D 5. IV fluids discontinue.   Hypokalemia: replaced.   Tachycardia; hydrate   -Cardiac arrest: This was likely respiratory arrest from overdose.  HTN; on Norvasc, carvedilol.    Goals of care: Per CCM felt to have very poor prognosis with severe anoxic encephalopathy, with no downtime following cardiac arrest, based on EEG and MRI and neurology recommendation. Per discussion have been held with family, they want to proceed with full scope of treatment will need vent in SNF or LTAC  Alcohol abuse: Out of window for withdrawal. Continue with thiamine.  COPD, stable nebulizers as needed  Pressure injury left heel not present on admission. Pressure injury  Pressure Injury 09/24/18 Heel Left Deep Tissue Injury - Purple or maroon localized area of discolored intact skin or blood-filled blister due to damage of  underlying soft  tissue from pressure and/or shear. (Active)  09/24/18 1115  Location: Heel  Location Orientation: Left  Staging: Deep Tissue Injury - Purple or maroon localized area of discolored intact skin or blood-filled blister due to damage of underlying soft tissue from pressure and/or shear.  Wound Description (Comments):   Present on Admission: No     Pressure Injury 09/24/18 Right Deep Tissue Injury - Purple or maroon localized area of discolored intact skin or blood-filled blister due to damage of underlying soft tissue from pressure and/or shear. (Active)  09/24/18 1120  Location:   Location Orientation: Right  Staging: Deep Tissue Injury - Purple or maroon localized area of discolored intact skin or blood-filled blister due to damage of underlying soft tissue from pressure and/or shear.  Wound Description (Comments):   Present on Admission: No     Nutrition Problem: Moderate Malnutrition Etiology: social / environmental circumstances(Hx substance abuse)    Signs/Symptoms: moderate muscle depletion, moderate fat depletion    Interventions: Tube feeding  Estimated body mass index is 22.36 kg/m as calculated from the following:   Height as of this encounter: '5\' 8"'  (1.727 m).   Weight as of this encounter: 66.7 kg.   DVT prophylaxis: Lovenox Code Status: Full code Family Communication: Son over phone 8/27 Disposition Plan: We will need SNF versus LTAC Consultants:   CCN following  Procedures:   None  Antimicrobials:  Currently none  Subjective: Non responsive, eyes close today   Objective: Vitals:   09/28/18 0500 09/28/18 0600 09/28/18 0700 09/28/18 0748  BP: 121/62 112/64 129/69   Pulse: 91 90 93 85  Resp: '17 12 14 12  ' Temp:   98.6 F (37 C)   TempSrc:   Axillary   SpO2: 100% 98% 100% 99%  Weight:      Height:        Intake/Output Summary (Last 24 hours) at 09/28/2018 0823 Last data filed at 09/28/2018 0800 Gross per 24 hour  Intake 1923.61  ml  Output 1610 ml  Net 313.61 ml   Filed Weights   09/26/18 0406 09/27/18 0420 09/28/18 0352  Weight: 57.3 kg 60.8 kg 66.7 kg    Examination:  General exam; non responsive Respiratory system: Trach, vent, Bilateral ronchus.  Cardiovascular system: S 1, S 2 RRR Gastrointestinal system: BS present, soft, peg in place Central nervous system: Alert.  Extremities: No edema Skin: No rashes.    Data Reviewed: I have personally reviewed following labs and imaging studies  CBC: Recent Labs  Lab 09/23/18 0417 09/24/18 0320 09/25/18 0830 09/26/18 0321 09/27/18 0245  WBC 9.4 9.8 10.9* 12.8* 10.4  HGB 10.9* 11.1* 10.9* 11.3* 10.0*  HCT 33.9* 34.5* 34.3* 34.6* 31.7*  MCV 87.4 86.7 87.5 86.9 87.6  PLT 360 405* 495* 444* 485*   Basic Metabolic Panel: Recent Labs  Lab 09/23/18 0417 09/24/18 0320 09/25/18 0830 09/26/18 0321 09/27/18 0245  NA 145 144 149* 146* 141  K 3.7 3.8 3.3* 3.9 3.8  CL 109 110 114* 112* 107  CO2 '23 24 24 24 25  ' GLUCOSE 102* 113* 146* 137* 128*  BUN 20 24* '21 22 19  ' CREATININE 1.21 1.22 1.20 1.08 0.99  CALCIUM 8.9 8.9 8.9 8.9 8.7*  MG  --  2.5*  --   --  2.0  PHOS  --  2.8  --   --   --    GFR: Estimated Creatinine Clearance: 69.2 mL/min (by C-G formula based on SCr of 0.99 mg/dL). Liver Function Tests: No results  for input(s): AST, ALT, ALKPHOS, BILITOT, PROT, ALBUMIN in the last 168 hours. No results for input(s): LIPASE, AMYLASE in the last 168 hours. Recent Labs  Lab 09/26/18 0316  AMMONIA 26   Coagulation Profile: No results for input(s): INR, PROTIME in the last 168 hours. Cardiac Enzymes: No results for input(s): CKTOTAL, CKMB, CKMBINDEX, TROPONINI in the last 168 hours. BNP (last 3 results) No results for input(s): PROBNP in the last 8760 hours. HbA1C: No results for input(s): HGBA1C in the last 72 hours. CBG: Recent Labs  Lab 09/27/18 1514 09/27/18 2006 09/27/18 2340 09/28/18 0307 09/28/18 0705  GLUCAP 105* 104* 118* 120*  104*   Lipid Profile: No results for input(s): CHOL, HDL, LDLCALC, TRIG, CHOLHDL, LDLDIRECT in the last 72 hours. Thyroid Function Tests: No results for input(s): TSH, T4TOTAL, FREET4, T3FREE, THYROIDAB in the last 72 hours. Anemia Panel: No results for input(s): VITAMINB12, FOLATE, FERRITIN, TIBC, IRON, RETICCTPCT in the last 72 hours. Sepsis Labs: No results for input(s): PROCALCITON, LATICACIDVEN in the last 168 hours.  Recent Results (from the past 240 hour(s))  Culture, blood (routine x 2)     Status: None   Collection Time: 09/18/18 10:29 PM   Specimen: BLOOD LEFT FOREARM  Result Value Ref Range Status   Specimen Description BLOOD LEFT FOREARM  Final   Special Requests   Final    BOTTLES DRAWN AEROBIC AND ANAEROBIC Blood Culture results may not be optimal due to an inadequate volume of blood received in culture bottles   Culture   Final    NO GROWTH 5 DAYS Performed at [facility], [place] [address] [place], [place] [address]    Report Status 09/23/2018 FINAL  Final  Culture, blood (routine x 2)     Status: None   Collection Time: 09/18/18 10:33 PM   Specimen: BLOOD LEFT HAND  Result Value Ref Range Status   Specimen Description BLOOD LEFT HAND  Final   Special Requests   Final    BOTTLES DRAWN AEROBIC AND ANAEROBIC Blood Culture results may not be optimal due to an inadequate volume of blood received in culture bottles   Culture   Final    NO GROWTH 5 DAYS Performed at [facility], [place] [address], [place], [place] [address]    Report Status 09/23/2018 FINAL  Final  Culture, blood (routine x 2)     Status: None (Preliminary result)   Collection Time: 09/25/18  9:11 PM   Specimen: BLOOD  Result Value Ref Range Status   Specimen Description BLOOD LEFT HAND  Final   Special Requests   Final    BOTTLES DRAWN AEROBIC ONLY Blood Culture adequate volume   Culture   Final    NO GROWTH 2 DAYS Performed at [facility], [place] [address] [place], [place]  [address]    Report Status PENDING  Incomplete  Culture, blood (routine x 2)     Status: None (Preliminary result)   Collection Time: 09/25/18  9:16 PM   Specimen: BLOOD  Result Value Ref Range Status   Specimen Description BLOOD LEFT HAND  Final   Special Requests   Final    BOTTLES DRAWN AEROBIC ONLY Blood Culture adequate volume   Culture   Final    NO GROWTH 2 DAYS Performed at [facility], [place] [address] [place], [place] [address]    Report Status PENDING  Incomplete  Culture, respiratory (non-expectorated)     Status: None (Preliminary result)  Collection Time: 09/26/18  9:01 AM   Specimen: Tracheal Aspirate; Respiratory  Result Value Ref Range Status   Specimen Description TRACHEAL ASPIRATE  Final   Special Requests NONE  Final   Gram Stain   Final    FEW WBC PRESENT, PREDOMINANTLY PMN RARE GRAM POSITIVE COCCI RARE GRAM NEGATIVE RODS    Culture   Final    FEW ACINETOBACTER SPECIES CULTURE REINCUBATED FOR BETTER GROWTH Performed at [facility], [place] [address] [place], [place] [address]    Report Status PENDING  Incomplete  Urine Culture     Status: Abnormal   Collection Time: 09/26/18 12:30 PM   Specimen: Urine, Random  Result Value Ref Range Status   Specimen Description URINE, RANDOM  Final   Special Requests   Final    NONE Performed at [facility], [place] [address] [place], [place] [address]    Culture (A)  Final    40,000 COLONIES/mL MULTIPLE SPECIES PRESENT, SUGGEST RECOLLECTION   Report Status 09/27/2018 FINAL  Final         Radiology Studies: No results found.      Scheduled Meds: . amLODipine  5 mg Per Tube Daily  . carvedilol  6.25 mg Per Tube BID WC  . chlorhexidine gluconate (MEDLINE KIT)  15 mL Mouth Rinse BID  . Chlorhexidine Gluconate Cloth  6 each Topical Q0600  . clonazePAM  1 mg Per Tube TID  . enoxaparin (LOVENOX) injection  40 mg Subcutaneous Q24H  . feeding supplement (JEVITY 1.2 CAL)  1,000 mL Per Tube Q24H   . feeding supplement (PRO-STAT SUGAR FREE 64)  30 mL Per Tube TID  . folic acid  1 mg Per Tube Daily  . free water  150 mL Per Tube Q6H  . insulin aspart  2-6 Units Subcutaneous Q4H  . mouth rinse  15 mL Mouth Rinse 10 times per day  . multivitamin  15 mL Per Tube Daily  . oxyCODONE  10 mg Per Tube Q6H  . pantoprazole sodium  40 mg Per Tube Q24H  . pregabalin  25 mg Per Tube BID  . QUEtiapine  25 mg Per Tube BID  . thiamine  100 mg Per Tube Daily   Continuous Infusions: . sodium chloride 10 mL/hr at 09/28/18 0800  . ceFEPime (MAXIPIME) IV 2 g (09/28/18 0309)     LOS: 20 days    Time spent: 35 minutes    Elmarie Shiley, MD [facility] Pager [phone]  If 7PM-7AM, please contact night-coverage [email or web address] Password [facility] 09/28/2018, 8:23 AM

## 2018-09-28 NOTE — Progress Notes (Signed)
Video call with daughter completed

## 2018-09-28 NOTE — Progress Notes (Signed)
NAME:  Micheal Carey, MRN:  [id number], DOB:  02-16-1950, LOS: 64 ADMISSION DATE:  09/08/2018, CONSULTATION DATE:  09/08/2018 REFERRING MD:  Dr. Darl Householder, ER, CHIEF COMPLAINT:  AMS   Brief History   68 yo male found in parking lot with PEA arrest with unknown downtime.  Required intubation.  UDS positive for opiates and ETOH 276.  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  Past Medical History  COPD, ETOH, opiate abuse  Significant Hospital Events   8/09 Admit 8/11 Fever 102.7  Consults:  Neurology  Procedures:  ETT 8/09 >>  Significant Diagnostic Tests:  Echo 8/10 >> EF 60 to 65%, mild MR EEG 8/12 >> profound, severe encephalopathy MRI brain 8/12 >> hypoxic/ischemic injury, moderate chronic small vessel ischemic disease  Micro Data:  COVID 8/09 >> negative Blood 8/09 >> negative Blood 8/12 >>NTD Sputum 8/12 >> Strep pneumoniae  Antimicrobials:  Vancomycin 8/11  Zosyn 8/11 >> 8/13 Rocephin 8/15 >> 8/17  Interim history/subjective:  No events overnight, no weaning given mental status  Objective   Blood pressure 92/64, pulse 82, temperature 98.6 F (37 C), temperature source Axillary, resp. rate 12, height 5\' 8"  (1.727 m), weight 66.7 kg, SpO2 99 %.    Vent Mode: PRVC FiO2 (%):  [30 %] 30 % Set Rate:  [12 bmp] 12 bmp Vt Set:  [540 mL] 540 mL PEEP:  [5 cmH20] 5 cmH20 Plateau Pressure:  [17 cmH20-26 cmH20] 18 cmH20   Intake/Output Summary (Last 24 hours) at 09/28/2018 1045 Last data filed at 09/28/2018 1000 Gross per 24 hour  Intake 1928.47 ml  Output 1470 ml  Net 458.47 ml   Filed Weights   09/26/18 0406 09/27/18 0420 09/28/18 0352  Weight: 57.3 kg 60.8 kg 66.7 kg   Examination:  General - Chronically ill appearing male, NAD, unresponsive  HEENT - Tensed/AT, PERRL, EOM-I and MMM, trach in place Cardiac - RRR, Nl S1/S2 and -M/R/G Chest - Coarse bilaterally Abdomen - Soft, NT, ND and +BS, PEG is CDI Extremities - -edema and -tenderness,  brisk cap refill Skin - no rashes, no lesions, warm dry and intact Neuro - doesn't follow commands  I reviewed CXR myself, trach is in a good position  Resolved Hospital Problem list     Assessment & Plan:   Acute hypoxic respiratory failure from accidental opiate/ETOH overdose. Hx of COPD. Failure to wean from ventilator due to mental status. - No weaning at this point - F/u CXR intermittently - PRN BDs - Titrate O2 for sat of 88-92% - Maintain current trach size and type  Pneumococcal pneumonia. Few Gram negative rods  per respiratory culture T Max  100.7 - Continue cefepime, stop date at 8 days - Trend WBC and fever curve - Re-culture as is clinically indicated  Acute anoxic encephalopathy with persistent vegetative state. - RASS goal 0 - Fentanyl PRN - Continue  clonazepam 1 mg TID - Continue Seroquel 25 BID QTc 0.36 - Oxy at 10 mg PO q6, if remains agitated then will increase  PEA cardiac arrest after opiate/ETOH induced respiratory failure. + 3900 cc's Hemodynamically stable at present - Monitor hemodynamics - MAP goal > 65  Hypernatremia. (dropped to 141 overnight) Hypokalemia. - F/u BMET - Free water 150 q6 - KVO IVF - Replace electrolytes as indicated  Moderate protein calorie malnutrition. Vomiting episode 8/15. PEG - Fube feeds - PRN zofran  Thrush. - Nystatin as ordered stop after 8/25 dose  Family are investigating [place] in  [place] at families request. They are obtaining a quote for cost of transfer through [facility]   PCCM will continue to follow twice weekly for vent and trach  Best practice:  Diet: tube feeds DVT prophylaxis: lovenox GI prophylaxis: protonix Mobility: bed rest Code Status: full code Disposition: ICU  Labs    CMP Latest Ref Rng & Units 09/28/2018 09/27/2018 09/26/2018  Glucose 70 - 99 mg/dL 161(W125(H) 960(A128(H) 540(J137(H)  BUN 8 - 23 mg/dL 17 19 22   Creatinine 0.61 - 1.24 mg/dL 8.110.93 9.140.99  7.821.08  Sodium 135 - 145 mmol/L 138 141 146(H)  Potassium 3.5 - 5.1 mmol/L 4.2 3.8 3.9  Chloride 98 - 111 mmol/L 106 107 112(H)  CO2 22 - 32 mmol/L 23 25 24   Calcium 8.9 - 10.3 mg/dL 9.5(A8.7(L) 2.1(H8.7(L) 8.9  Total Protein 6.5 - 8.1 g/dL - - -  Total Bilirubin 0.3 - 1.2 mg/dL - - -  Alkaline Phos 38 - 126 U/L - - -  AST 15 - 41 U/L - - -  ALT 0 - 44 U/L - - -   CBC Latest Ref Rng & Units 09/28/2018 09/27/2018 09/26/2018  WBC 4.0 - 10.5 K/uL 9.0 10.4 12.8(H)  Hemoglobin 13.0 - 17.0 g/dL 0.8(M9.6(L) 10.0(L) 11.3(L)  Hematocrit 39.0 - 52.0 % 29.2(L) 31.7(L) 34.6(L)  Platelets 150 - 400 K/uL 410(H) 463(H) 444(H)   CBG (last 3)  Recent Labs    09/27/18 2340 09/28/18 0307 09/28/18 0705  GLUCAP 118* 120* 104*   ABG    Component Value Date/Time   PHART 7.506 (H) 09/10/2018 0359   PCO2ART 32.5 09/10/2018 0359   PO2ART 177.0 (H) 09/10/2018 0359   HCO3 25.7 09/10/2018 0359   TCO2 27 09/10/2018 0359   ACIDBASEDEF 1.0 09/09/2018 0427   O2SAT 100.0 09/10/2018 0359   Alyson ReedyWesam G. Jolee Critcher, M.D. [facility]. Pager: [phone]. After hours pager: [phone].

## 2018-09-29 LAB — BASIC METABOLIC PANEL
Anion gap: 8 (ref 5–15)
BUN: 20 mg/dL (ref 8–23)
CO2: 23 mmol/L (ref 22–32)
Calcium: 8.9 mg/dL (ref 8.9–10.3)
Chloride: 108 mmol/L (ref 98–111)
Creatinine, Ser: 0.77 mg/dL (ref 0.61–1.24)
GFR calc Af Amer: >60 mL/min (ref >60)
GFR calc non Af Amer: >60 mL/min (ref >60)
Glucose, Bld: 114 mg/dL — ABNORMAL HIGH (ref 70–99)
Potassium: 4.1 mmol/L (ref 3.5–5.1)
Sodium: 139 mmol/L (ref 135–145)

## 2018-09-29 LAB — GLUCOSE, CAPILLARY
Glucose-Capillary: 113 mg/dL — ABNORMAL HIGH (ref 70–99)
Glucose-Capillary: 101 mg/dL — ABNORMAL HIGH (ref 70–99)
Glucose-Capillary: 117 mg/dL — ABNORMAL HIGH (ref 70–99)
Glucose-Capillary: 112 mg/dL — ABNORMAL HIGH (ref 70–99)
Glucose-Capillary: 106 mg/dL — ABNORMAL HIGH (ref 70–99)

## 2018-09-29 LAB — CULTURE, RESPIRATORY W GRAM STAIN

## 2018-09-29 MED ORDER — CHLORHEXIDINE GLUCONATE 0.12 % MT SOLN
OROMUCOSAL | Status: AC
  Filled 2018-09-29: qty 15

## 2018-09-29 NOTE — Progress Notes (Signed)
RT note-Patient tolerated wean today for 5 hours on PS 12/5 peep. Increased RR moderate amount secretions, placed back to full support.

## 2018-09-29 NOTE — Progress Notes (Signed)
PROGRESS NOTE    Micheal Carey  AST:[id number] DOB: 07-Feb-1950 DOA: 09/08/2018 PCP: [name], MD   Brief Narrative: 68 year old male found in parking lot with PEA arrest, unknown downtime require mechanical ventilation, UDS was positive for opioids and alcohol level was 276.  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 Patient could not be extubated and subsequently was trached on 8/17. -Status post PEG tube on 8/19.. -PCCM following and managing vent, sedation, weaning trials. -Transfer to [facility] service on 8/20 first. [facility] course complicated by agitation and intermittent vomiting. The Social worker consulted and following for disposition LTAC versus SNF    Assessment & Plan:   Active Problems:   Acute respiratory failure (HCC)   Malnutrition of moderate degree   Cardiac arrest (HCC)   Pressure injury of skin   Status post tracheostomy ([place])   1-Acute hypoxic respiratory failure: -Likely related to accidental opioid/alcohol overdose, in the background of COPD. -Admitted following PEA arrest with unknown downtime -Complicated by severe anoxic encephalopathy and persistent vegetative state. -Failed to wean from ventilator due to anoxic encephalopathy. -Status post tracheostomy on 8/17. -Also treated for pneumococcal pneumonia. -Continue to attempt pressure support trials as tolerated. -Sedation per CCM, continue oxycodone, Lyrica, started Seroquel and Klonopin. -Social work consult for long-term (SNF versus LTAC) CCM following for vent management.  Patient tolerated to be weaned for 5 hours on PS 12/5 PEEP on 8/30.   2-PNA; Mild fever; respiratory culture growing few gram negative rods.  Follow blood culture no growth to dated. , urine culture 40,000 multiple bacterial morphotype.   Send sputum culture. Growing acinetobacter and Citrobacter freundii both sensitive to  cefepime Started on cefepime 8/28.  Continue with antibiotics.  Day 2. Diarrhea resolved.  Continue with IV antibiotics, had fever 101 on 8/28.  3-Dysphagia: -Status post PEG tube 8/19. -Had a multiple intermittent episode of vomiting this weekend, tube feeds had to be held, NG tube was changed to suction. -Finally restarted tube feedings on 8/23, currently tolerating tube feeding at 40 cc/h. -No evidence of ileus on KUB. -Having multiple BM, dc reglan.  -Diarrhea improved.   4-severe anoxic encephalopathy: -Evaluated by neurology, earlier this admission EEG and MRI findings suggestive of severe anoxic brain injury. -This was discussed with family, prognosis felt to be very poor. -Repeat EEG 8/20 suggestive of severe encephalopathy as well. -Family wants full aggressive scope of treatment -he has been restless, increase oxycodone back to 10 mg.   5-Pneumococcal pneumonia: -Respiratory culture grew few strep pneumo. -Blood cultures are negative -Initially treated with Zosyn from 8/12 through 8/13 and then Rocephin from 80/15 to 80/17, off antibiotics now. -treated, develops fever, respiratory culture growing gram negative rods. Started on cefepime 8/28.  6-UTI: Culture growing Enterobacter species and Citrobacter.  Sensitivity to follow.  Hypernatremia:resolved with D 5. IV fluids discontinue.   Hypokalemia: replaced.   Tachycardia; hydrate   -Cardiac arrest: This was likely respiratory arrest from overdose.  HTN; on Norvasc, carvedilol.    Goals of care: Per CCM felt to have very poor prognosis with severe anoxic encephalopathy, with no downtime following cardiac arrest, based on EEG and MRI and neurology recommendation. Per discussion have been held with family, they want to proceed with full scope of treatment will need vent in SNF or LTAC  Alcohol abuse: Out of window for withdrawal. Continue with thiamine.  COPD, stable nebulizers as needed  Pressure injury left  heel  not present on admission. Pressure injury  Pressure Injury 09/24/18 Heel Left Deep Tissue Injury - Purple or maroon localized area of discolored intact skin or blood-filled blister due to damage of underlying soft tissue from pressure and/or shear. (Active)  09/24/18 1115  Location: Heel  Location Orientation: Left  Staging: Deep Tissue Injury - Purple or maroon localized area of discolored intact skin or blood-filled blister due to damage of underlying soft tissue from pressure and/or shear.  Wound Description (Comments):   Present on Admission: No     Pressure Injury 09/24/18 Right Deep Tissue Injury - Purple or maroon localized area of discolored intact skin or blood-filled blister due to damage of underlying soft tissue from pressure and/or shear. (Active)  09/24/18 1120  Location:   Location Orientation: Right  Staging: Deep Tissue Injury - Purple or maroon localized area of discolored intact skin or blood-filled blister due to damage of underlying soft tissue from pressure and/or shear.  Wound Description (Comments):   Present on Admission: No     Nutrition Problem: Moderate Malnutrition Etiology: social / environmental circumstances(Hx substance abuse)    Signs/Symptoms: moderate muscle depletion, moderate fat depletion    Interventions: Tube feeding  Estimated body mass index is 20.25 kg/m as calculated from the following:   Height as of this encounter: '5\' 8"'  (1.727 m).   Weight as of this encounter: 60.4 kg.   DVT prophylaxis: Lovenox Code Status: Full code Family Communication: Son over phone 8/29 Disposition Plan: We will need SNF versus LTAC Consultants:   CCN following  Procedures:   None  Antimicrobials:  Currently none  Subjective: Nonresponsive, has eyes open today.  Objective: Vitals:   09/29/18 0400 09/29/18 0500 09/29/18 0600 09/29/18 0706  BP: (!) 99/57 99/65 121/75   Pulse: 75 87 88   Resp: '12 17 17   ' Temp: 99.2 F (37.3 C)    98.3 F (36.8 C)  TempSrc: Oral   Axillary  SpO2: 97% 100% 100%   Weight:      Height:        Intake/Output Summary (Last 24 hours) at 09/29/2018 0728 Last data filed at 09/29/2018 0600 Gross per 24 hour  Intake 2230.05 ml  Output 1145 ml  Net 1085.05 ml   Filed Weights   09/27/18 0420 09/28/18 0352 09/29/18 0345  Weight: 60.8 kg 66.7 kg 60.4 kg    Examination:  General exam; unresponsive Respiratory system: Trach on the vent bilateral coarse sounds Cardiovascular system: S1-S2 regular rhythm and rate Gastrointestinal system: Bowel sounds present soft nontender nondistended Central nervous system; vegetative state, does not follow commands Extremities: no edema Skin: No rashes.    Data Reviewed: I have personally reviewed following labs and imaging studies  CBC: Recent Labs  Lab 09/24/18 0320 09/25/18 0830 09/26/18 0321 09/27/18 0245 09/28/18 0820  WBC 9.8 10.9* 12.8* 10.4 9.0  HGB 11.1* 10.9* 11.3* 10.0* 9.6*  HCT 34.5* 34.3* 34.6* 31.7* 29.2*  MCV 86.7 87.5 86.9 87.6 85.4  PLT 405* 495* 444* 463* 196*   Basic Metabolic Panel: Recent Labs  Lab 09/24/18 0320 09/25/18 0830 09/26/18 0321 09/27/18 0245 09/28/18 0820 09/29/18 0428  NA 144 149* 146* 141 138 139  K 3.8 3.3* 3.9 3.8 4.2 4.1  CL 110 114* 112* 107 106 108  CO2 '24 24 24 25 23 23  ' GLUCOSE 113* 146* 137* 128* 125* 114*  BUN 24* '21 22 19 17 20  ' CREATININE 1.22 1.20 1.08 0.99 0.93 0.77  CALCIUM 8.9 8.9 8.9 8.7*  8.7* 8.9  MG 2.5*  --   --  2.0  --   --   PHOS 2.8  --   --   --   --   --    GFR: Estimated Creatinine Clearance: 77.6 mL/min (by C-G formula based on SCr of 0.77 mg/dL). Liver Function Tests: No results for input(s): AST, ALT, ALKPHOS, BILITOT, PROT, ALBUMIN in the last 168 hours. No results for input(s): LIPASE, AMYLASE in the last 168 hours. Recent Labs  Lab 09/26/18 0316  AMMONIA 26   Coagulation Profile: No results for input(s): INR, PROTIME in the last 168 hours. Cardiac  Enzymes: No results for input(s): CKTOTAL, CKMB, CKMBINDEX, TROPONINI in the last 168 hours. BNP (last 3 results) No results for input(s): PROBNP in the last 8760 hours. HbA1C: No results for input(s): HGBA1C in the last 72 hours. CBG: Recent Labs  Lab 09/28/18 1542 09/28/18 1918 09/28/18 2310 09/29/18 0322 09/29/18 0706  GLUCAP 108* 131* 104* 113* 101*   Lipid Profile: No results for input(s): CHOL, HDL, LDLCALC, TRIG, CHOLHDL, LDLDIRECT in the last 72 hours. Thyroid Function Tests: No results for input(s): TSH, T4TOTAL, FREET4, T3FREE, THYROIDAB in the last 72 hours. Anemia Panel: No results for input(s): VITAMINB12, FOLATE, FERRITIN, TIBC, IRON, RETICCTPCT in the last 72 hours. Sepsis Labs: No results for input(s): PROCALCITON, LATICACIDVEN in the last 168 hours.  Recent Results (from the past 240 hour(s))  Culture, blood (routine x 2)     Status: None (Preliminary result)   Collection Time: 09/25/18  9:11 PM   Specimen: BLOOD  Result Value Ref Range Status   Specimen Description BLOOD LEFT HAND  Final   Special Requests   Final    BOTTLES DRAWN AEROBIC ONLY Blood Culture adequate volume   Culture   Final    NO GROWTH 3 DAYS Performed at [facility], [address] [place], [place] [address]    Report Status PENDING  Incomplete  Culture, blood (routine x 2)     Status: None (Preliminary result)   Collection Time: 09/25/18  9:16 PM   Specimen: BLOOD  Result Value Ref Range Status   Specimen Description BLOOD LEFT HAND  Final   Special Requests   Final    BOTTLES DRAWN AEROBIC ONLY Blood Culture adequate volume   Culture   Final    NO GROWTH 3 DAYS Performed at [facility], [place] [address] [place], [place] [address]    Report Status PENDING  Incomplete  Culture, respiratory (non-expectorated)     Status: None (Preliminary result)   Collection Time: 09/26/18  9:01 AM   Specimen: Tracheal Aspirate; Respiratory  Result Value Ref Range Status    Specimen Description TRACHEAL ASPIRATE  Final   Special Requests NONE  Final   Gram Stain   Final    FEW WBC PRESENT, PREDOMINANTLY PMN RARE GRAM POSITIVE COCCI RARE GRAM NEGATIVE RODS    Culture   Final    FEW ACINETOBACTER SPECIES CULTURE REINCUBATED FOR BETTER GROWTH    Report Status PENDING  Incomplete   Organism ID, Bacteria ACINETOBACTER SPECIES  Final      Susceptibility   Acinetobacter species - MIC*    CEFTAZIDIME 4 SENSITIVE Sensitive     CEFTRIAXONE 16 INTERMEDIATE Intermediate     CIPROFLOXACIN <=0.25 SENSITIVE Sensitive     GENTAMICIN <=1 SENSITIVE Sensitive     IMIPENEM <=0.25 SENSITIVE Sensitive     PIP/TAZO <=4 SENSITIVE Sensitive     TRIMETH/SULFA <=20  SENSITIVE Sensitive     CEFEPIME 2 SENSITIVE Sensitive     AMPICILLIN/SULBACTAM Value in next row Sensitive      <=2 SENSITIVEPerformed at [facility] [address], [place], [place] [address]    * FEW ACINETOBACTER SPECIES  Urine Culture     Status: Abnormal   Collection Time: 09/26/18 12:30 PM   Specimen: Urine, Random  Result Value Ref Range Status   Specimen Description URINE, RANDOM  Final   Special Requests   Final    NONE Performed at [facility], [place] [address], [place], [place] [address]    Culture (A)  Final    40,000 COLONIES/mL MULTIPLE SPECIES PRESENT, SUGGEST RECOLLECTION   Report Status 09/27/2018 FINAL  Final  Urine Culture     Status: Abnormal (Preliminary result)   Collection Time: 09/27/18  1:59 PM   Specimen: Urine, Random  Result Value Ref Range Status   Specimen Description URINE, RANDOM  Final   Special Requests NONE  Final   Culture (A)  Final    >=100,000 COLONIES/mL GRAM NEGATIVE RODS IDENTIFICATION AND SUSCEPTIBILITIES TO FOLLOW CULTURE REINCUBATED FOR BETTER GROWTH Performed at [facility], [place] [address], [place], [place] [address]    Report Status PENDING  Incomplete         Radiology Studies: No results found.      Scheduled Meds: .  amLODipine  5 mg Per Tube Daily  . carvedilol  6.25 mg Per Tube BID WC  . chlorhexidine gluconate (MEDLINE KIT)  15 mL Mouth Rinse BID  . Chlorhexidine Gluconate Cloth  6 each Topical Q0600  . clonazePAM  1 mg Per Tube TID  . enoxaparin (LOVENOX) injection  40 mg Subcutaneous Q24H  . feeding supplement (JEVITY 1.2 CAL)  1,000 mL Per Tube Q24H  . feeding supplement (PRO-STAT SUGAR FREE 64)  30 mL Per Tube TID  . folic acid  1 mg Per Tube Daily  . free water  150 mL Per Tube Q6H  . insulin aspart  2-6 Units Subcutaneous Q4H  . mouth rinse  15 mL Mouth Rinse 10 times per day  . multivitamin  15 mL Per Tube Daily  . oxyCODONE  10 mg Per Tube Q6H  . pantoprazole sodium  40 mg Per Tube Q24H  . pregabalin  25 mg Per Tube BID  . QUEtiapine  25 mg Per Tube BID  . thiamine  100 mg Per Tube Daily   Continuous Infusions: . sodium chloride 10 mL/hr at 09/29/18 0600  . ceFEPime (MAXIPIME) IV Stopped (09/29/18 0357)     LOS: 21 days    Time spent: 35 minutes    Elmarie Shiley, MD [facility] Pager [phone]  If 7PM-7AM, please contact night-coverage [email or web address] Password [email or web address] 09/29/2018, 7:28 AM

## 2018-09-30 ENCOUNTER — Inpatient Hospital Stay (HOSPITAL_COMMUNITY): Payer: Medicare Other

## 2018-09-30 DIAGNOSIS — J189 Pneumonia, unspecified organism: Secondary | ICD-10-CM

## 2018-09-30 DIAGNOSIS — Z9911 Dependence on respirator [ventilator] status: Secondary | ICD-10-CM | POA: Insufficient documentation

## 2018-09-30 LAB — BASIC METABOLIC PANEL
Anion gap: 9 (ref 5–15)
BUN: 17 mg/dL (ref 8–23)
CO2: 25 mmol/L (ref 22–32)
Calcium: 9 mg/dL (ref 8.9–10.3)
Chloride: 103 mmol/L (ref 98–111)
Creatinine, Ser: 0.88 mg/dL (ref 0.61–1.24)
GFR calc Af Amer: >60 mL/min (ref >60)
GFR calc non Af Amer: >60 mL/min (ref >60)
Glucose, Bld: 116 mg/dL — ABNORMAL HIGH (ref 70–99)
Potassium: 4.3 mmol/L (ref 3.5–5.1)
Sodium: 137 mmol/L (ref 135–145)

## 2018-09-30 LAB — CBC
HCT: 31.2 % — ABNORMAL LOW (ref 39.0–52.0)
Hemoglobin: 10.1 g/dL — ABNORMAL LOW (ref 13.0–17.0)
MCH: 27.5 pg (ref 26.0–34.0)
MCHC: 32.4 g/dL (ref 30.0–36.0)
MCV: 85 fL (ref 80.0–100.0)
Platelets: 433 10*3/uL — ABNORMAL HIGH (ref 150–400)
RBC: 3.67 MIL/uL — ABNORMAL LOW (ref 4.22–5.81)
RDW: 12.8 % (ref 11.5–15.5)
WBC: 10.2 10*3/uL (ref 4.0–10.5)
nRBC: 0 % (ref 0.0–0.2)

## 2018-09-30 LAB — GLUCOSE, CAPILLARY
Glucose-Capillary: 113 mg/dL — ABNORMAL HIGH (ref 70–99)
Glucose-Capillary: 120 mg/dL — ABNORMAL HIGH (ref 70–99)
Glucose-Capillary: 112 mg/dL — ABNORMAL HIGH (ref 70–99)
Glucose-Capillary: 116 mg/dL — ABNORMAL HIGH (ref 70–99)
Glucose-Capillary: 105 mg/dL — ABNORMAL HIGH (ref 70–99)
Glucose-Capillary: 99 mg/dL (ref 70–99)

## 2018-09-30 LAB — URINE CULTURE: Culture: >=100000 — AB

## 2018-09-30 LAB — CULTURE, BLOOD (ROUTINE X 2)
Culture: NO GROWTH
Culture: NO GROWTH
Special Requests: ADEQUATE
Special Requests: ADEQUATE

## 2018-09-30 MED ORDER — SODIUM CHLORIDE 0.9 % IV SOLN
1.0000 g | Freq: Three times a day (TID) | INTRAVENOUS | Status: AC
  Administered 2018-09-30 – 2018-10-07 (×21): 1 g via INTRAVENOUS
  Filled 2018-09-30 (×22): qty 1

## 2018-09-30 NOTE — Consult Note (Signed)
[place] Nurse wound follow up Patient receiving care in [facility] 2M07. Wound type: bilateral DTPI to heels Wound bed: the right heel is very lightly discolored maroon, dry, and responding nicely to the use of Prevalon boots to relieve pressure.  The left heel is more darkly pigmented maroon, dry and I can see the resolution of the subdermal blood bulla.  This heel is also responding nicely to the use of the Prevalon boot. Drainage (amount, consistency, odor) none Periwound: intact, normal color and texture Dressing procedure/placement/frequency: Continue the use of Prevalon boots bilaterally. Val Riles, RN, MSN, CWOCN, CNS-BC, pager [phone]

## 2018-09-30 NOTE — Progress Notes (Addendum)
PROGRESS NOTE    Micheal Carey  WLS:[id number] DOB: October 25, 1950 DOA: 09/08/2018 PCP: [name], MD   Brief Narrative: 68 year old male found in parking lot with PEA arrest, unknown downtime require mechanical ventilation, UDS was positive for opioids and alcohol level was 276.  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 Patient could not be extubated and subsequently was trached on 8/17. -Status post PEG tube on 8/19.. -PCCM following and managing vent, sedation, weaning trials. -Transfer to [facility] service on 8/20 first. [facility] course complicated by agitation and intermittent vomiting. The Social worker consulted and following for disposition LTAC versus SNF    Assessment & Plan:   Active Problems:   Pneumonia   Acute respiratory failure (HCC)   Malnutrition of moderate degree   Cardiac arrest (HCC)   Pressure injury of skin   Status post tracheostomy ([place])   Ventilator dependence ([place])   1-Acute hypoxic respiratory failure: -Likely related to accidental opioid/alcohol overdose, in the background of COPD. -Admitted following PEA arrest with unknown downtime -Complicated by severe anoxic encephalopathy and persistent vegetative state. -Failed to wean from ventilator due to anoxic encephalopathy. -Status post tracheostomy on 8/17. -Also treated for pneumococcal pneumonia. -Continue to attempt pressure support trials as tolerated. -Sedation per CCM, continue oxycodone, Lyrica, started Seroquel and Klonopin. -Social work consult for long-term (SNF versus LTAC) CCM following for vent management.  Patient tolerated to be weaned for 5 hours on PS 12/5 PEEP on 8/30.   2-PNA; Mild fever; respiratory culture growing few gram negative rods.  nitially treated with Zosyn from 8/12 through 8/13 and then Rocephin from 80/15 to 80/17 Follow blood culture no growth to dated. , urine  culture 40,000 multiple bacterial morphotype.   Send sputum culture. Growing acinetobacter and Citrobacter freundii both sensitive to cefepime Started on cefepime 8/28---8/31 antibiotics change to meropenem 8/31 Diarrhea resolved.  Continue with IV antibiotics, had fever 101 on 8/28.  3-Dysphagia: -Status post PEG tube 8/19. -Had a multiple intermittent episode of vomiting this weekend, tube feeds had to be held, NG tube was changed to suction. -Finally restarted tube feedings on 8/23, currently tolerating tube feeding at 40 cc/h. -No evidence of ileus on KUB. -Having multiple BM, dc reglan.  -Diarrhea improved.   4-Severe anoxic encephalopathy: -Evaluated by neurology, earlier this admission EEG and MRI findings suggestive of severe anoxic brain injury. -This was discussed with family, prognosis felt to be very poor. -Repeat EEG 8/20 suggestive of severe encephalopathy as well. -Family wants full aggressive scope of treatment -he has been restless, increase oxycodone back to 10 mg.    5-UTI: Culture growing Enterobacter species and Citrobacter.  Antibiotics change to imipenem to cover UTI and PNA.  Needs 7 days of Meropenem.   Hypernatremia:resolved with D 5. IV fluids discontinue.   Hypokalemia: replaced.   Tachycardia; hydrate   -Cardiac arrest: This was likely respiratory arrest from overdose.  HTN; on Norvasc, carvedilol.    Goals of care: Per CCM felt to have very poor prognosis with severe anoxic encephalopathy, with no downtime following cardiac arrest, based on EEG and MRI and neurology recommendation. Per discussion have been held with family, they want to proceed with full scope of treatment will need vent in SNF or LTAC  Alcohol abuse: Out of window for withdrawal. Continue with thiamine.  COPD, stable nebulizers as needed  Pressure injury left heel not present on admission. Pressure injury  Pressure Injury 09/24/18  Heel Left Deep Tissue Injury - Purple or  maroon localized area of discolored intact skin or blood-filled blister due to damage of underlying soft tissue from pressure and/or shear. (Active)  09/24/18 1115  Location: Heel  Location Orientation: Left  Staging: Deep Tissue Injury - Purple or maroon localized area of discolored intact skin or blood-filled blister due to damage of underlying soft tissue from pressure and/or shear.  Wound Description (Comments):   Present on Admission: No     Pressure Injury 09/24/18 Right Deep Tissue Injury - Purple or maroon localized area of discolored intact skin or blood-filled blister due to damage of underlying soft tissue from pressure and/or shear. (Active)  09/24/18 1120  Location:   Location Orientation: Right  Staging: Deep Tissue Injury - Purple or maroon localized area of discolored intact skin or blood-filled blister due to damage of underlying soft tissue from pressure and/or shear.  Wound Description (Comments):   Present on Admission: No     Nutrition Problem: Moderate Malnutrition Etiology: social / environmental circumstances(Hx substance abuse)    Signs/Symptoms: moderate muscle depletion, moderate fat depletion    Interventions: Tube feeding  Estimated body mass index is 20.55 kg/m as calculated from the following:   Height as of this encounter: '5\' 8"'  (1.727 m).   Weight as of this encounter: 61.3 kg.   DVT prophylaxis: Lovenox Code Status: Full code Family Communication: Son over phone 8/31 Disposition Plan: We will need SNF versus LTAC Consultants:   CCN following  Procedures:   None  Antimicrobials:  Currently none  Subjective: Non responsive.    Objective: Vitals:   09/30/18 1142 09/30/18 1200 09/30/18 1300 09/30/18 1330  BP:  1'23/78 99/62 98/63 '  Pulse: 99 98 83 82  Resp: '20 20 17 16  ' Temp:      TempSrc:      SpO2: 100% 100% 97% 97%  Weight:      Height:        Intake/Output Summary (Last 24 hours) at 09/30/2018 1420 Last data filed at  09/30/2018 1300 Gross per 24 hour  Intake 1834.04 ml  Output 1770 ml  Net 64.04 ml   Filed Weights   09/28/18 0352 09/29/18 0345 09/30/18 0334  Weight: 66.7 kg 60.4 kg 61.3 kg    Examination:  General exam;non responsive, eyes open Respiratory system: Trach on vent, bilateral ronchus.  Cardiovascular system: S 1, S 2 RRR Gastrointestinal system: Bowel sounds present soft nontender nondistended Central nervous system; vegetative state, does not follow commands Extremities: no edema Skin: No rashes.    Data Reviewed: I have personally reviewed following labs and imaging studies  CBC: Recent Labs  Lab 09/25/18 0830 09/26/18 0321 09/27/18 0245 09/28/18 0820 09/30/18 0841  WBC 10.9* 12.8* 10.4 9.0 10.2  HGB 10.9* 11.3* 10.0* 9.6* 10.1*  HCT 34.3* 34.6* 31.7* 29.2* 31.2*  MCV 87.5 86.9 87.6 85.4 85.0  PLT 495* 444* 463* 410* 013*   Basic Metabolic Panel: Recent Labs  Lab 09/24/18 0320  09/26/18 0321 09/27/18 0245 09/28/18 0820 09/29/18 0428 09/30/18 0841  NA 144   < > 146* 141 138 139 137  K 3.8   < > 3.9 3.8 4.2 4.1 4.3  CL 110   < > 112* 107 106 108 103  CO2 24   < > '24 25 23 23 25  ' GLUCOSE 113*   < > 137* 128* 125* 114* 116*  BUN 24*   < > '22 19 17 20 17  ' CREATININE 1.22   < >  1.08 0.99 0.93 0.77 0.88  CALCIUM 8.9   < > 8.9 8.7* 8.7* 8.9 9.0  MG 2.5*  --   --  2.0  --   --   --   PHOS 2.8  --   --   --   --   --   --    < > = values in this interval not displayed.   GFR: Estimated Creatinine Clearance: 71.6 mL/min (by C-G formula based on SCr of 0.88 mg/dL). Liver Function Tests: No results for input(s): AST, ALT, ALKPHOS, BILITOT, PROT, ALBUMIN in the last 168 hours. No results for input(s): LIPASE, AMYLASE in the last 168 hours. Recent Labs  Lab 09/26/18 0316  AMMONIA 26   Coagulation Profile: No results for input(s): INR, PROTIME in the last 168 hours. Cardiac Enzymes: No results for input(s): CKTOTAL, CKMB, CKMBINDEX, TROPONINI in the last 168  hours. BNP (last 3 results) No results for input(s): PROBNP in the last 8760 hours. HbA1C: No results for input(s): HGBA1C in the last 72 hours. CBG: Recent Labs  Lab 09/29/18 1915 09/29/18 2324 09/30/18 0326 09/30/18 0701 09/30/18 1104  GLUCAP 106* 113* 120* 112* 116*   Lipid Profile: No results for input(s): CHOL, HDL, LDLCALC, TRIG, CHOLHDL, LDLDIRECT in the last 72 hours. Thyroid Function Tests: No results for input(s): TSH, T4TOTAL, FREET4, T3FREE, THYROIDAB in the last 72 hours. Anemia Panel: No results for input(s): VITAMINB12, FOLATE, FERRITIN, TIBC, IRON, RETICCTPCT in the last 72 hours. Sepsis Labs: No results for input(s): PROCALCITON, LATICACIDVEN in the last 168 hours.  Recent Results (from the past 240 hour(s))  Culture, blood (routine x 2)     Status: None   Collection Time: 09/25/18  9:11 PM   Specimen: BLOOD  Result Value Ref Range Status   Specimen Description BLOOD LEFT HAND  Final   Special Requests   Final    BOTTLES DRAWN AEROBIC ONLY Blood Culture adequate volume   Culture   Final    NO GROWTH 5 DAYS Performed at [facility], [address], [place], [place] [address]    Report Status 09/30/2018 FINAL  Final  Culture, blood (routine x 2)     Status: None   Collection Time: 09/25/18  9:16 PM   Specimen: BLOOD  Result Value Ref Range Status   Specimen Description BLOOD LEFT HAND  Final   Special Requests   Final    BOTTLES DRAWN AEROBIC ONLY Blood Culture adequate volume   Culture   Final    NO GROWTH 5 DAYS Performed at [facility], [place] [address] [place], [place] [address]    Report Status 09/30/2018 FINAL  Final  Culture, respiratory (non-expectorated)     Status: None   Collection Time: 09/26/18  9:01 AM   Specimen: Tracheal Aspirate; Respiratory  Result Value Ref Range Status   Specimen Description TRACHEAL ASPIRATE  Final   Special Requests NONE  Final   Gram Stain   Final    FEW WBC PRESENT, PREDOMINANTLY PMN RARE  GRAM POSITIVE COCCI RARE GRAM NEGATIVE RODS Performed at [facility], [place] [address], [place], [place] [address]    Culture   Final    FEW ACINETOBACTER SPECIES FEW CITROBACTER FREUNDII    Report Status 09/29/2018 FINAL  Final   Organism ID, Bacteria ACINETOBACTER SPECIES  Final   Organism ID, Bacteria CITROBACTER FREUNDII  Final      Susceptibility   Acinetobacter species - MIC*    CEFTAZIDIME  4 SENSITIVE Sensitive     CEFTRIAXONE 16 INTERMEDIATE Intermediate     CIPROFLOXACIN <=0.25 SENSITIVE Sensitive     GENTAMICIN <=1 SENSITIVE Sensitive     IMIPENEM <=0.25 SENSITIVE Sensitive     PIP/TAZO <=4 SENSITIVE Sensitive     TRIMETH/SULFA <=20 SENSITIVE Sensitive     CEFEPIME 2 SENSITIVE Sensitive     AMPICILLIN/SULBACTAM <=2 SENSITIVE Sensitive     * FEW ACINETOBACTER SPECIES   Citrobacter freundii - MIC*    CEFAZOLIN >=64 RESISTANT Resistant     CEFEPIME <=1 SENSITIVE Sensitive     CEFTAZIDIME >=64 RESISTANT Resistant     CEFTRIAXONE >=64 RESISTANT Resistant     CIPROFLOXACIN <=0.25 SENSITIVE Sensitive     GENTAMICIN <=1 SENSITIVE Sensitive     IMIPENEM <=0.25 SENSITIVE Sensitive     TRIMETH/SULFA <=20 SENSITIVE Sensitive     PIP/TAZO >=128 RESISTANT Resistant     * FEW CITROBACTER FREUNDII  Urine Culture     Status: Abnormal   Collection Time: 09/26/18 12:30 PM   Specimen: Urine, Random  Result Value Ref Range Status   Specimen Description URINE, RANDOM  Final   Special Requests   Final    NONE Performed at [facility], [address], [place], [place] [address]    Culture (A)  Final    40,000 COLONIES/mL MULTIPLE SPECIES PRESENT, SUGGEST RECOLLECTION   Report Status 09/27/2018 FINAL  Final  Urine Culture     Status: Abnormal   Collection Time: 09/27/18  1:59 PM   Specimen: Urine, Random  Result Value Ref Range Status   Specimen Description URINE, RANDOM  Final   Special Requests   Final    NONE Performed at [facility], [place] [address],  [place], [place] [address]    Culture (A)  Final    >=100,000 COLONIES/mL ENTEROBACTER ASBURIAE >=100,000 COLONIES/mL CITROBACTER FREUNDII    Report Status 09/30/2018 FINAL  Final   Organism ID, Bacteria ENTEROBACTER ASBURIAE (A)  Final   Organism ID, Bacteria CITROBACTER FREUNDII (A)  Final      Susceptibility   Citrobacter freundii - MIC*    CEFAZOLIN >=64 RESISTANT Resistant     CEFTRIAXONE >=64 RESISTANT Resistant     CIPROFLOXACIN <=0.25 SENSITIVE Sensitive     GENTAMICIN <=1 SENSITIVE Sensitive     IMIPENEM 0.5 SENSITIVE Sensitive     NITROFURANTOIN <=16 SENSITIVE Sensitive     TRIMETH/SULFA <=20 SENSITIVE Sensitive     PIP/TAZO >=128 RESISTANT Resistant     * >=100,000 COLONIES/mL CITROBACTER FREUNDII   Enterobacter asburiae - MIC*    CEFAZOLIN >=64 RESISTANT Resistant     CEFTRIAXONE >=64 RESISTANT Resistant     CIPROFLOXACIN <=0.25 SENSITIVE Sensitive     GENTAMICIN <=1 SENSITIVE Sensitive     IMIPENEM 0.5 SENSITIVE Sensitive     NITROFURANTOIN 64 INTERMEDIATE Intermediate     TRIMETH/SULFA 40 SENSITIVE Sensitive     PIP/TAZO >=128 RESISTANT Resistant     * >=100,000 COLONIES/mL ENTEROBACTER ASBURIAE         Radiology Studies: Dg Chest Port 1 View  Result Date: 09/30/2018 CLINICAL DATA:  Acute respiratory failure. EXAM: PORTABLE CHEST 1 VIEW COMPARISON:  09/25/2018 FINDINGS: Tracheostomy tube remains in place. Heart size is normal. Both lungs are clear. IMPRESSION: No active disease. Electronically Signed   By: Marlaine Hind M.D.   On: 09/30/2018 05:24        Scheduled Meds: . amLODipine  5 mg Per Tube Daily  . carvedilol  6.25 mg Per Tube BID WC  . chlorhexidine gluconate (MEDLINE KIT)  15 mL Mouth Rinse BID  . Chlorhexidine Gluconate Cloth  6 each Topical Q0600  . clonazePAM  1 mg Per Tube TID  . enoxaparin (LOVENOX) injection  40 mg Subcutaneous Q24H  . feeding supplement (JEVITY 1.2 CAL)  1,000 mL Per Tube Q24H  . feeding supplement (PRO-STAT SUGAR FREE 64)   30 mL Per Tube TID  . folic acid  1 mg Per Tube Daily  . free water  150 mL Per Tube Q6H  . insulin aspart  2-6 Units Subcutaneous Q4H  . mouth rinse  15 mL Mouth Rinse 10 times per day  . multivitamin  15 mL Per Tube Daily  . oxyCODONE  10 mg Per Tube Q6H  . pantoprazole sodium  40 mg Per Tube Q24H  . pregabalin  25 mg Per Tube BID  . QUEtiapine  25 mg Per Tube BID  . thiamine  100 mg Per Tube Daily   Continuous Infusions: . sodium chloride 10 mL/hr at 09/30/18 1300  . meropenem (MERREM) IV 1 g (09/30/18 1414)     LOS: 22 days    Time spent: 35 minutes    Elmarie Shiley, MD [facility] Pager [phone]  If 7PM-7AM, please contact night-coverage [email or web address] Password [email or web address] 09/30/2018, 2:20 PM

## 2018-09-30 NOTE — Progress Notes (Addendum)
2pm: CSW attempted to reach Andi Hence without success.  1pm: CSW attempted to reach [facility] supervisor Andi Hence at [phone] without success, CSW left voicemail requesting a return call.  11:50am: CSW placed call to Langley at [facility] requesting a return call.  11am: CSW received return call from [facility] stating that this patient already has active Medicaid. Crystal updated the patient's facesheet to include his Medicaid number.  CSW attempted to reach Gae Bon at [facility] to inform her of that information. CSW spoke with Melissa in the business office at [facility] to inform her of this information. Melissa states that DSS needs to be contacted to switch his Medicaid to long term coverage.  CSW spoke with DSS receptionist who provided CSW with long term Medicaid worker for assistance - Melissa Gentle at [phone].  10am: CSW spoke with Gae Bon at [facility] who states this patient has been accepted into the facility pending a CRE rectal swab and a pending Medicaid application.   CSW spoke with patient's daughter Chy'vonne to inform her of this information. Chy'vonne requested assistance with Medicaid application and agreed to have a financial counselor complete the application.  CSW spoke with Velora Heckler of financial counseling to request her assistance with this patient's Medicaid application.   9am: CSW attempted to reach admissions staff at [facility] without success, left voicemail requesting a return call.  Madilyn Fireman, MSW, LCSW-A Clinical Social Worker [facility] [phone]

## 2018-09-30 NOTE — Progress Notes (Signed)
   NAME:  Micheal Carey, MRN:  [id number], DOB:  [birth year], LOS: 26 ADMISSION DATE:  09/08/2018, CONSULTATION DATE:  09/08/2018 REFERRING MD:  Dr. Darl Householder, ER, CHIEF COMPLAINT:  AMS   Brief History   68 yo male found in parking lot with PEA arrest with unknown downtime.  Required intubation.  UDS positive for opiates and ETOH 276.  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  Past Medical History  COPD, ETOH, opiate abuse  Significant Hospital Events   8/09 Admit 8/11 Fever 102.7  Consults:  Neurology   Significant Diagnostic Tests:  Echo 8/10 >> EF 60 to 65%, mild MR EEG 8/12 >> profound, severe encephalopathy MRI brain 8/12 >> hypoxic/ischemic injury, moderate chronic small vessel ischemic disease  Micro Data:  COVID 8/09 >> negative Blood 8/09 >> negative Blood 8/12 >>NTD Sputum 8/12 >> Strep pneumoniae Urine culture 8/28 enterobacter asburiae and citrobacter resp culture 8/28: acinetobacter and citrobacter   Antimicrobials:  Vancomycin 8/11  Zosyn 8/11 >> 8/13 Rocephin 8/15 >> 8/17 Meropenem 8/31>>>  Interim history/subjective:  Appears comfortable currently on PSV  Objective   Blood pressure (Abnormal) 145/85, pulse 95, temperature 98.7 F (37.1 C), temperature source Axillary, resp. rate (Abnormal) 27, height 5\' 8"  (1.727 m), weight 61.3 kg, SpO2 100 %.    Vent Mode: PSV;CPAP FiO2 (%):  [30 %] 30 % Set Rate:  [12 bmp] 12 bmp Vt Set:  [540 mL] 540 mL PEEP:  [5 cmH20] 5 cmH20 Pressure Support:  [12 cmH20] 12 cmH20 Plateau Pressure:  [16 cmH20] 16 cmH20   Intake/Output Summary (Last 24 hours) at 09/30/2018 1111 Last data filed at 09/30/2018 1000 Gross per 24 hour  Intake 2079.18 ml  Output 1515 ml  Net 564.18 ml   Filed Weights   09/28/18 0352 09/29/18 0345 09/30/18 0334  Weight: 66.7 kg 60.4 kg 61.3 kg   Examination: General 68 year old black male currently on vent  HENT NCAT trach unremarkable Pulm scattered rhonchi Card rrr   abd peg unremarkable gu cl yellow Neuro awake, not following commands. Not focal  Ext no sig edema   Resolved Hospital Problem list   Acute hypoxic resp failure Pneumococcal PNA PEA cardiac arrest after opiate/ETOH induced respiratory failure. Hypernatremia  Thrush  Assessment & Plan:   Ventilator/trach dependent w/ Failure to wean from ventilator due to mental status. Plan Cont full vent support OK to cycle on PSV VAP bundle   Recurrent HCAP; acinetobacter and citrobacter Plan Changing abx to meropenem x 7d (start 8/31)  Acute anoxic encephalopathy with persistent vegetative state. Plan rass goal 0 Cont current rxs  Intermittent fluid and electrolyte imbalance Plan Trend and replace as needed   Moderate protein calorie malnutrition. Vomiting episode 8/15. PEG Plan Cont tube feeds    Family are investigating transfer to facility in [place] at families request. They are obtaining a quote for cost of transfer through [facility] will continue to follow twice weekly for vent and trach  Best practice:  Diet: tube feeds DVT prophylaxis: lovenox GI prophylaxis: protonix Mobility: bed rest Code Status: full code Disposition: ICU  Erick Colace ACNP-BC [place] Pager # [phone] OR # [phone] if no answer

## 2018-09-30 NOTE — Progress Notes (Signed)
Pharmacy Antibiotic Note  Micheal Carey is a 68 y.o. male with PMH of COPD, ETOH abuse, opiate abuse, admitted on 09/08/2018 after found in parking lot with PEA arrest with unknown downtime. Patient is intubated with a tracheostomy. Patient spiked a fever on 08/28 with TA growing acinetobacter and citrobacter freundii. Urine culture growing enterobacter asburiae and citrobacter freundii. Due to culture results and patient's clinical results, therapy was escalated from cefepime to meropenem. Pharmacy has been consulted for meropenem dosing.  WBC is wnls but increased from 9 at 10.2. Scr. Has increased slightly from 0.77 to 0.88 with good UOP 1 mL/kg/hr.  Tmax of 99.3.  Plan: Change from cefepime to meropenem IV 1g q8h for 7 days. Continue to monitor culture data, renal function, clinical status, WBC, and temp.   Height: 5\' 8"  (172.7 cm) Weight: 135 lb 2.3 oz (61.3 kg) IBW/kg (Calculated) : 68.4  Temp (24hrs), Avg:98.7 F (37.1 C), Min:97.6 F (36.4 C), Max:99.3 F (37.4 C)  Recent Labs  Lab 09/25/18 0830 09/26/18 0321 09/27/18 0245 09/28/18 0820 09/29/18 0428 09/30/18 0841  WBC 10.9* 12.8* 10.4 9.0  --  10.2  CREATININE 1.20 1.08 0.99 0.93 0.77 0.88    Estimated Creatinine Clearance: 71.6 mL/min (by C-G formula based on SCr of 0.88 mg/dL).    No Known Allergies  Antimicrobials this admission: Vanc 8/12 >>8/14 Zosyn 8/12 >>8/14 CTX 8/15 >>8/17 Cefepime 8/28>>8/31 Meropenem 8/31>>  Microbiology results: 8/9, 8/18 COVID: neg 8/9, 8/12, 8/19 BCx x4: neg 8/9 UCx: neg 8/11 MRSA PCR: neg 8/12 RCx (tracheal aspirate): Strep PNA (pan-S) 8/26: Bcx: ngtd 8/27: Urine: Sent 8/27: TA: GNR in sputum is Acinetobacter and citrobacter (both suc to cefepime) 8/28: Ucx: Enterobacter species, citrobacter (both susc to imipenem, resistant to zosyn & ceftriaxone)  Thank you for allowing pharmacy to be a part of this patient's care.  Sherren Kerns, PharmD PGY1 Acute Care Pharmacy  Resident [phone] 09/30/2018 12:11 PM

## 2018-10-01 LAB — BASIC METABOLIC PANEL
Anion gap: 10 (ref 5–15)
BUN: 20 mg/dL (ref 8–23)
CO2: 24 mmol/L (ref 22–32)
Calcium: 9 mg/dL (ref 8.9–10.3)
Chloride: 102 mmol/L (ref 98–111)
Creatinine, Ser: 0.89 mg/dL (ref 0.61–1.24)
GFR calc Af Amer: >60 mL/min (ref >60)
GFR calc non Af Amer: >60 mL/min (ref >60)
Glucose, Bld: 120 mg/dL — ABNORMAL HIGH (ref 70–99)
Potassium: 3.9 mmol/L (ref 3.5–5.1)
Sodium: 136 mmol/L (ref 135–145)

## 2018-10-01 LAB — GLUCOSE, CAPILLARY
Glucose-Capillary: 100 mg/dL — ABNORMAL HIGH (ref 70–99)
Glucose-Capillary: 115 mg/dL — ABNORMAL HIGH (ref 70–99)
Glucose-Capillary: 115 mg/dL — ABNORMAL HIGH (ref 70–99)
Glucose-Capillary: 117 mg/dL — ABNORMAL HIGH (ref 70–99)
Glucose-Capillary: 118 mg/dL — ABNORMAL HIGH (ref 70–99)
Glucose-Capillary: 119 mg/dL — ABNORMAL HIGH (ref 70–99)
Glucose-Capillary: 99 mg/dL (ref 70–99)

## 2018-10-01 LAB — CBC
HCT: 30.4 % — ABNORMAL LOW (ref 39.0–52.0)
Hemoglobin: 9.7 g/dL — ABNORMAL LOW (ref 13.0–17.0)
MCH: 27.5 pg (ref 26.0–34.0)
MCHC: 31.9 g/dL (ref 30.0–36.0)
MCV: 86.1 fL (ref 80.0–100.0)
Platelets: 442 10*3/uL — ABNORMAL HIGH (ref 150–400)
RBC: 3.53 MIL/uL — ABNORMAL LOW (ref 4.22–5.81)
RDW: 12.9 % (ref 11.5–15.5)
WBC: 10.7 10*3/uL — ABNORMAL HIGH (ref 4.0–10.5)
nRBC: 0 % (ref 0.0–0.2)

## 2018-10-01 NOTE — Progress Notes (Signed)
Physical Therapy Discharge Patient Details Name: Micheal Carey MRN: [id number] DOB: 1950/03/09 Today's Date: 10/01/2018 Time: 0352-4818 PT Time Calculation (min) (ACUTE ONLY): 11 min  Patient discharged from PT services secondary to patient has made no progress toward goals in a reasonable time frame.  Please see latest therapy progress note for current level of functioning and progress toward goals.    Progress and discharge plan discussed with patient and/or caregiver: Patient unable to participate in discharge planning and no caregivers available  GP     Shary Decamp [facility] 10/01/2018, 12:52 PM  [place] Pager [phone] Office [phone]

## 2018-10-01 NOTE — Progress Notes (Signed)
Physical Therapy Treatment Patient Details Name: Micheal Carey MRN: [id number] DOB: 1950/12/13 Today's Date: 10/01/2018    History of Present Illness Pt is a 68 year old man admitted 09/08/18 after found down with PEA arrest, unknown down time. Pt intubated. +severe hypoxic encephalopathy, ETOH and opiates.    PT Comments    Pt continues to show no progress with PT. Does not follow commands, demonstrate any balance reaction, nor have purposeful movements. Will DC PT at this time. Will need long term care.    Follow Up Recommendations  LTACH;SNF     Equipment Recommendations  Other (comment)(To be determined at next venue)    Recommendations for Other Services       Precautions / Restrictions Precautions Precautions: Fall;Other (comment) Precaution Comments: trach/ventilator, rectal pouch Restrictions Weight Bearing Restrictions: No    Mobility  Bed Mobility               General bed mobility comments: Placed bed in chair position. Total assist to bring trunk forward from bed with no type of reaction from pt  Transfers                    Ambulation/Gait                 Stairs             Wheelchair Mobility    Modified Rankin (Stroke Patients Only)       Balance                                            Cognition Arousal/Alertness: Lethargic   Overall Cognitive Status: Impaired/Different from baseline                                 General Comments: Pt with brief eye opening to vocal stimuli. Not following commands. No purposeful movement.       Exercises Other Exercises Other Exercises: Performed PROM to UE and LE's.  No response to any. Knees in contact with each other due to adductor tone. Placed blanket between knees.     General Comments        Pertinent Vitals/Pain Pain Assessment: Faces Faces Pain Scale: No hurt    Home Living                      Prior Function            PT Goals (current goals can now be found in the care plan section) Acute Rehab PT Goals Patient Stated Goal: unable to state Progress towards PT goals: Not progressing toward goals - comment    Frequency           PT Plan Other (comment)(Will DC PT)    Co-evaluation              AM-PAC PT "6 Clicks" Mobility   Outcome Measure  Help needed turning from your back to your side while in a flat bed without using bedrails?: Total Help needed moving from lying on your back to sitting on the side of a flat bed without using bedrails?: Total Help needed moving to and from a bed to a chair (including a wheelchair)?: Total Help needed standing up from a chair using your arms (e.g., wheelchair or bedside  chair)?: Total Help needed to walk in hospital room?: Total Help needed climbing 3-5 steps with a railing? : Total 6 Click Score: 6    End of Session   Activity Tolerance: Patient limited by lethargy Patient left: in bed Nurse Communication: Mobility status PT Visit Diagnosis: Other abnormalities of gait and mobility (R26.89)     Time: [id number] PT Time Calculation (min) (ACUTE ONLY): 11 min  Charges:  $Therapeutic Activity: 8-22 mins                     [facility] Shimeka Carey PT Acute Rehabilitation Services Pager [phone] Office [phone]    Angelina OkCary W Faulkner Carey 10/01/2018, 12:50 PM

## 2018-10-01 NOTE — Progress Notes (Signed)
PROGRESS NOTE    Micheal Carey  YPP:[id number] DOB: [birth year] DOA: 09/08/2018 PCP: [name], MD   Brief Narrative: 68 year old male found in parking lot with PEA arrest, unknown downtime require mechanical ventilation, UDS was positive for opioids and alcohol level was 276.  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 Patient could not be extubated and subsequently was trached on 8/17. -Status post PEG tube on 8/19.. -PCCM following and managing vent, sedation, weaning trials. -Transfer to [facility] service on 8/21. [facility] course complicated by agitation and intermittent vomiting.  Recurrent pneumonia and now UTI. The Social worker consulted and following for disposition LTAC versus SNF    Assessment & Plan:   Active Problems:   Pneumonia   Acute respiratory failure (HCC)   Malnutrition of moderate degree   Cardiac arrest (HCC)   Pressure injury of skin   Status post tracheostomy ([place])   Ventilator dependence ([place])   1-Acute hypoxic respiratory failure: -Likely related to accidental opioid/alcohol overdose, in the background of COPD. -Admitted following PEA arrest with unknown downtime -Complicated by severe anoxic encephalopathy and persistent vegetative state. -Failed to wean from ventilator due to anoxic encephalopathy. -Status post tracheostomy on 8/17. -Also treated for pneumococcal pneumonia. -Continue to attempt pressure support trials as tolerated. -Sedation per CCM, continue oxycodone, Lyrica, started Seroquel and Klonopin. -Social work consult for long-term (SNF versus LTAC) -CCM following for vent management twice a week.  Patient tolerated to be weaned for 5 hours on PS 12/5 PEEP on 8/30.   2-PNA; Mild fever; respiratory culture growing few gram negative rods.  nitially treated with Zosyn from 8/12 through 8/13 and then Rocephin from 80/15 to 80/17 Follow  blood culture no growth to dated. , urine culture 40,000 multiple bacterial morphotype.   Send sputum culture. Growing acinetobacter and Citrobacter freundii both sensitive to cefepime Started on cefepime 8/28---8/31 antibiotics change to meropenem 8/31 to cover pathogens from urine. Diarrhea resolved.  Continue with IV antibiotics, had fever 101 on 8/28. He will need 7 days of antibiotics,  first days was 8/31.  3-Dysphagia: -Status post PEG tube 8/19. -Had a multiple intermittent episode of vomiting this weekend, tube feeds had to be held, NG tube was changed to suction. -Finally restarted tube feedings on 8/23, currently tolerating tube feeding at 40 cc/h. -No evidence of ileus on KUB. -Having multiple BM, dc reglan.  -Diarrhea improved.   4-Severe anoxic encephalopathy: -Evaluated by neurology, earlier this admission EEG and MRI findings suggestive of severe anoxic brain injury. -This was discussed with family, prognosis felt to be very poor. -Repeat EEG 8/20 suggestive of severe encephalopathy as well. -Family wants full aggressive scope of treatment -he has been restless, increased  oxycodone back to 10 mg.   -Nonverbal not following commands.  5-UTI: Culture growing Enterobacter species and Citrobacter.  Antibiotics change to imipenem to cover UTI and PNA.  Needs 7 days of Meropenem.   Hypernatremia:resolved with D 5. IV fluids.   Hypokalemia: replaced.   Tachycardia; hydrate   -Cardiac arrest: This was likely respiratory arrest from overdose.  HTN; on Norvasc, carvedilol.    Goals of care: Per CCM felt to have very poor prognosis with severe anoxic encephalopathy, with no downtime following cardiac arrest, based on EEG and MRI and neurology recommendation. Per discussion have been held with family, they want to proceed with full scope of treatment will need vent in SNF or LTAC  Alcohol abuse:  Out of window for withdrawal. Continue with thiamine.  COPD, stable  nebulizers as needed  Pressure injury left heel not present on admission. Pressure injury  Pressure Injury 09/24/18 Heel Left Deep Tissue Injury - Purple or maroon localized area of discolored intact skin or blood-filled blister due to damage of underlying soft tissue from pressure and/or shear. (Active)  09/24/18 1115  Location: Heel  Location Orientation: Left  Staging: Deep Tissue Injury - Purple or maroon localized area of discolored intact skin or blood-filled blister due to damage of underlying soft tissue from pressure and/or shear.  Wound Description (Comments):   Present on Admission: No     Pressure Injury 09/24/18 Right Deep Tissue Injury - Purple or maroon localized area of discolored intact skin or blood-filled blister due to damage of underlying soft tissue from pressure and/or shear. (Active)  09/24/18 1120  Location:   Location Orientation: Right  Staging: Deep Tissue Injury - Purple or maroon localized area of discolored intact skin or blood-filled blister due to damage of underlying soft tissue from pressure and/or shear.  Wound Description (Comments):   Present on Admission: No     Nutrition Problem: Moderate Malnutrition Etiology: social / environmental circumstances(Hx substance abuse)    Signs/Symptoms: moderate muscle depletion, moderate fat depletion    Interventions: Tube feeding  Estimated body mass index is 20.01 kg/m as calculated from the following:   Height as of this encounter: _0  (1.727 m).   Weight as of this encounter: 59.7 kg.   DVT prophylaxis: Lovenox Code Status: Full code Family Communication: Son over phone 8/31 Disposition Plan: We will need SNF versus LTAC Consultants:   CCN following  Procedures:   None  Antimicrobials:  Currently none  Subjective: Nonresponsive.   Objective: Vitals:   10/01/18 0400 10/01/18 0419 10/01/18 0500 10/01/18 0600  BP: 136/77  122/68   Pulse: 97  96 91  Resp: _1 Temp:    98.3 F (36.8 C)   TempSrc:   Axillary   SpO2: 100%  100% 99%  Weight:  59.7 kg    Height:        Intake/Output Summary (Last 24 hours) at 10/01/2018 0723 Last data filed at 10/01/2018 0600 Gross per 24 hour  Intake 1396.61 ml  Output 2250 ml  Net -853.39 ml   Filed Weights   09/29/18 0345 09/30/18 0334 10/01/18 0419  Weight: 60.4 kg 61.3 kg 59.7 kg    Examination:  General exam; chronically ill-appearing, nonresponsive Respiratory system: trach, vent.  Bilateral rhonchorous Cardiovascular system: S1, S2 regular rhythm and rate Gastrointestinal system: Bowel sounds present, soft nontender nondistended Central nervous system; vegetative state, does not follow commands Extremities: No edema Skin: No rashes   Data Reviewed: I have personally reviewed following labs and imaging studies  CBC: Recent Labs  Lab 09/26/18 0321 09/27/18 0245 09/28/18 0820 09/30/18 0841 10/01/18 0254  WBC 12.8* 10.4 9.0 10.2 10.7*  HGB 11.3* 10.0* 9.6* 10.1* 9.7*  HCT 34.6* 31.7* 29.2* 31.2* 30.4*  MCV 86.9 87.6 85.4 85.0 86.1  PLT 444* 463* 410* 433* 197*   Basic Metabolic Panel: Recent Labs  Lab 09/27/18 0245 09/28/18 0820 09/29/18 0428 09/30/18 0841 10/01/18 0254  NA 141 138 139 137 136  K 3.8 4.2 4.1 4.3 3.9  CL 107 106 108 103 102  CO2 _2 GLUCOSE 128* 125* 114* 116* 120*  BUN _3 CREATININE 0.99 0.93 0.77 0.88 0.89  CALCIUM 8.7* 8.7* 8.9 9.0 9.0  MG 2.0  --   --   --   --    GFR: Estimated Creatinine Clearance: 68.9 mL/min (by C-G formula based on SCr of 0.89 mg/dL). Liver Function Tests: No results for input(s): AST, ALT, ALKPHOS, BILITOT, PROT, ALBUMIN in the last 168 hours. No results for input(s): LIPASE, AMYLASE in the last 168 hours. Recent Labs  Lab 09/26/18 0316  AMMONIA 26   Coagulation Profile: No results for input(s): INR, PROTIME in the last 168 hours. Cardiac Enzymes: No results for input(s): CKTOTAL, CKMB, CKMBINDEX, TROPONINI  in the last 168 hours. BNP (last 3 results) No results for input(s): PROBNP in the last 8760 hours. HbA1C: No results for input(s): HGBA1C in the last 72 hours. CBG: Recent Labs  Lab 09/30/18 1104 09/30/18 1501 09/30/18 1921 10/01/18 0032 10/01/18 0344  GLUCAP 116* 105* 99 119* 100*   Lipid Profile: No results for input(s): CHOL, HDL, LDLCALC, TRIG, CHOLHDL, LDLDIRECT in the last 72 hours. Thyroid Function Tests: No results for input(s): TSH, T4TOTAL, FREET4, T3FREE, THYROIDAB in the last 72 hours. Anemia Panel: No results for input(s): VITAMINB12, FOLATE, FERRITIN, TIBC, IRON, RETICCTPCT in the last 72 hours. Sepsis Labs: No results for input(s): PROCALCITON, LATICACIDVEN in the last 168 hours.  Recent Results (from the past 240 hour(s))  Culture, blood (routine x 2)     Status: None   Collection Time: 09/25/18  9:11 PM   Specimen: BLOOD  Result Value Ref Range Status   Specimen Description BLOOD LEFT HAND  Final   Special Requests   Final    BOTTLES DRAWN AEROBIC ONLY Blood Culture adequate volume   Culture   Final    NO GROWTH 5 DAYS Performed at [facility], [address], [place], [place] [address]    Report Status 09/30/2018 FINAL  Final  Culture, blood (routine x 2)     Status: None   Collection Time: 09/25/18  9:16 PM   Specimen: BLOOD  Result Value Ref Range Status   Specimen Description BLOOD LEFT HAND  Final   Special Requests   Final    BOTTLES DRAWN AEROBIC ONLY Blood Culture adequate volume   Culture   Final    NO GROWTH 5 DAYS Performed at [facility], [place] [address], [place], [place] [address]    Report Status 09/30/2018 FINAL  Final  Culture, respiratory (non-expectorated)     Status: None   Collection Time: 09/26/18  9:01 AM   Specimen: Tracheal Aspirate; Respiratory  Result Value Ref Range Status   Specimen Description TRACHEAL ASPIRATE  Final   Special Requests NONE  Final   Gram Stain   Final    FEW WBC PRESENT, PREDOMINANTLY  PMN RARE GRAM POSITIVE COCCI RARE GRAM NEGATIVE RODS Performed at [facility], [place] [address], [place], [place] [address]    Culture   Final    FEW ACINETOBACTER SPECIES FEW CITROBACTER FREUNDII    Report Status 09/29/2018 FINAL  Final   Organism ID, Bacteria ACINETOBACTER SPECIES  Final   Organism ID, Bacteria CITROBACTER FREUNDII  Final      Susceptibility   Acinetobacter species - MIC*    CEFTAZIDIME 4 SENSITIVE Sensitive     CEFTRIAXONE 16 INTERMEDIATE Intermediate     CIPROFLOXACIN <=0.25 SENSITIVE Sensitive     GENTAMICIN <=1 SENSITIVE Sensitive     IMIPENEM <=0.25 SENSITIVE Sensitive     PIP/TAZO <=4 SENSITIVE Sensitive  TRIMETH/SULFA <=20 SENSITIVE Sensitive     CEFEPIME 2 SENSITIVE Sensitive     AMPICILLIN/SULBACTAM <=2 SENSITIVE Sensitive     * FEW ACINETOBACTER SPECIES   Citrobacter freundii - MIC*    CEFAZOLIN >=64 RESISTANT Resistant     CEFEPIME <=1 SENSITIVE Sensitive     CEFTAZIDIME >=64 RESISTANT Resistant     CEFTRIAXONE >=64 RESISTANT Resistant     CIPROFLOXACIN <=0.25 SENSITIVE Sensitive     GENTAMICIN <=1 SENSITIVE Sensitive     IMIPENEM <=0.25 SENSITIVE Sensitive     TRIMETH/SULFA <=20 SENSITIVE Sensitive     PIP/TAZO >=128 RESISTANT Resistant     * FEW CITROBACTER FREUNDII  Urine Culture     Status: Abnormal   Collection Time: 09/26/18 12:30 PM   Specimen: Urine, Random  Result Value Ref Range Status   Specimen Description URINE, RANDOM  Final   Special Requests   Final    NONE Performed at [facility], [place] [address], [place], [place] [address]    Culture (A)  Final    40,000 COLONIES/mL MULTIPLE SPECIES PRESENT, SUGGEST RECOLLECTION   Report Status 09/27/2018 FINAL  Final  Urine Culture     Status: Abnormal   Collection Time: 09/27/18  1:59 PM   Specimen: Urine, Random  Result Value Ref Range Status   Specimen Description URINE, RANDOM  Final   Special Requests   Final    NONE Performed at [facility], [place]  [address] [place], [place] [address]    Culture (A)  Final    >=100,000 COLONIES/mL ENTEROBACTER ASBURIAE >=100,000 COLONIES/mL CITROBACTER FREUNDII    Report Status 09/30/2018 FINAL  Final   Organism ID, Bacteria ENTEROBACTER ASBURIAE (A)  Final   Organism ID, Bacteria CITROBACTER FREUNDII (A)  Final      Susceptibility   Citrobacter freundii - MIC*    CEFAZOLIN >=64 RESISTANT Resistant     CEFTRIAXONE >=64 RESISTANT Resistant     CIPROFLOXACIN <=0.25 SENSITIVE Sensitive     GENTAMICIN <=1 SENSITIVE Sensitive     IMIPENEM 0.5 SENSITIVE Sensitive     NITROFURANTOIN <=16 SENSITIVE Sensitive     TRIMETH/SULFA <=20 SENSITIVE Sensitive     PIP/TAZO >=128 RESISTANT Resistant     * >=100,000 COLONIES/mL CITROBACTER FREUNDII   Enterobacter asburiae - MIC*    CEFAZOLIN >=64 RESISTANT Resistant     CEFTRIAXONE >=64 RESISTANT Resistant     CIPROFLOXACIN <=0.25 SENSITIVE Sensitive     GENTAMICIN <=1 SENSITIVE Sensitive     IMIPENEM 0.5 SENSITIVE Sensitive     NITROFURANTOIN 64 INTERMEDIATE Intermediate     TRIMETH/SULFA 40 SENSITIVE Sensitive     PIP/TAZO >=128 RESISTANT Resistant     * >=100,000 COLONIES/mL ENTEROBACTER ASBURIAE         Radiology Studies: Dg Chest Port 1 View  Result Date: 09/30/2018 CLINICAL DATA:  Acute respiratory failure. EXAM: PORTABLE CHEST 1 VIEW COMPARISON:  09/25/2018 FINDINGS: Tracheostomy tube remains in place. Heart size is normal. Both lungs are clear. IMPRESSION: No active disease. Electronically Signed   By: Marlaine Hind M.D.   On: 09/30/2018 05:24        Scheduled Meds: . amLODipine  5 mg Per Tube Daily  . carvedilol  6.25 mg Per Tube BID WC  . chlorhexidine gluconate (MEDLINE KIT)  15 mL Mouth Rinse BID  . Chlorhexidine Gluconate Cloth  6 each Topical Q0600  . clonazePAM  1 mg Per Tube TID  . enoxaparin (LOVENOX) injection  40 mg Subcutaneous Q24H  .  feeding supplement (JEVITY 1.2 CAL)  1,000 mL Per Tube Q24H  . feeding supplement (PRO-STAT SUGAR  FREE 64)  30 mL Per Tube TID  . folic acid  1 mg Per Tube Daily  . free water  150 mL Per Tube Q6H  . insulin aspart  2-6 Units Subcutaneous Q4H  . mouth rinse  15 mL Mouth Rinse 10 times per day  . multivitamin  15 mL Per Tube Daily  . oxyCODONE  10 mg Per Tube Q6H  . pantoprazole sodium  40 mg Per Tube Q24H  . pregabalin  25 mg Per Tube BID  . QUEtiapine  25 mg Per Tube BID  . thiamine  100 mg Per Tube Daily   Continuous Infusions: . sodium chloride Stopped (10/01/18 0537)  . meropenem (MERREM) IV 200 mL/hr at 10/01/18 0600     LOS: 23 days    Time spent: 35 minutes    Elmarie Shiley, MD [facility] Pager [phone]  If 7PM-7AM, please contact night-coverage [email or web address] Password [email or web address] 10/01/2018, 7:23 AM

## 2018-10-01 NOTE — Progress Notes (Addendum)
2:10pm: CSW received return email from [facility] stating the [facility] was received and that it was in the process of being assisgned to a case worker.  CSW spoke with Denyse Amass, Education officer, museum at [facility] to obtain clarification as to what test they are requesting this patient receive prior to his admission into their facility. Denyse Amass states the patient needs a CRE (Carbapenem-resistant Enterobacteriaceae) rectal swab due to new guidelines issued by the state for vent SNF admissions. The results of this test will determine which hallway the patient is placed on.   CSW spoke with Maudie Mercury, Surveyor, quantity of [employer] to request her assistance with this matter. Maudie Mercury was agreeable to assist with ensuring the testing is completed.  1:30pm: CSW attempted to reach [place] via telephone without success. CSW attempted to reach Andi Hence without success.  10am: CSW attempted to reach [place] via email to confirm receipt of of email without success.  9am: CSW sent email to [employer] that included patient's FL2.   8:30am: CSW received return call from Andi Hence at [place] who states that the only documentation needed to switch the patient's current Medicaid to long term coverage is an FL2. CSW securely emailed FL2 to [employer] ([email or web address]), the assigned case worker for this patient.   Madilyn Fireman, MSW, LCSW-A Clinical Social Worker [facility] [phone]

## 2018-10-02 DIAGNOSIS — J988 Other specified respiratory disorders: Secondary | ICD-10-CM

## 2018-10-02 DIAGNOSIS — R403 Persistent vegetative state: Secondary | ICD-10-CM

## 2018-10-02 DIAGNOSIS — N390 Urinary tract infection, site not specified: Secondary | ICD-10-CM | POA: Diagnosis present

## 2018-10-02 LAB — GLUCOSE, CAPILLARY
Glucose-Capillary: 94 mg/dL (ref 70–99)
Glucose-Capillary: 87 mg/dL (ref 70–99)
Glucose-Capillary: 117 mg/dL — ABNORMAL HIGH (ref 70–99)
Glucose-Capillary: 94 mg/dL (ref 70–99)
Glucose-Capillary: 94 mg/dL (ref 70–99)

## 2018-10-02 LAB — COMPREHENSIVE METABOLIC PANEL
ALT: 19 U/L (ref 0–44)
AST: 35 U/L (ref 15–41)
Albumin: 2.3 g/dL — ABNORMAL LOW (ref 3.5–5.0)
Alkaline Phosphatase: 118 U/L (ref 38–126)
Anion gap: 10 (ref 5–15)
BUN: 22 mg/dL (ref 8–23)
CO2: 24 mmol/L (ref 22–32)
Calcium: 8.7 mg/dL — ABNORMAL LOW (ref 8.9–10.3)
Chloride: 104 mmol/L (ref 98–111)
Creatinine, Ser: 1 mg/dL (ref 0.61–1.24)
GFR calc Af Amer: >60 mL/min (ref >60)
GFR calc non Af Amer: >60 mL/min (ref >60)
Glucose, Bld: 96 mg/dL (ref 70–99)
Potassium: 4.1 mmol/L (ref 3.5–5.1)
Sodium: 138 mmol/L (ref 135–145)
Total Bilirubin: 0.4 mg/dL (ref 0.3–1.2)
Total Protein: 6.1 g/dL — ABNORMAL LOW (ref 6.5–8.1)

## 2018-10-02 LAB — CBC
HCT: 28 % — ABNORMAL LOW (ref 39.0–52.0)
Hemoglobin: 8.9 g/dL — ABNORMAL LOW (ref 13.0–17.0)
MCH: 27.6 pg (ref 26.0–34.0)
MCHC: 31.8 g/dL (ref 30.0–36.0)
MCV: 86.7 fL (ref 80.0–100.0)
Platelets: 385 10*3/uL (ref 150–400)
RBC: 3.23 MIL/uL — ABNORMAL LOW (ref 4.22–5.81)
RDW: 12.9 % (ref 11.5–15.5)
WBC: 8.2 10*3/uL (ref 4.0–10.5)
nRBC: 0 % (ref 0.0–0.2)

## 2018-10-02 LAB — MISC LABCORP TEST (SEND OUT): Labcorp test code: 183865

## 2018-10-02 MED ORDER — JEVITY 1.2 CAL PO LIQD
1000.0000 mL | ORAL | Status: DC
  Administered 2018-10-02 – 2018-10-08 (×9): 1000 mL
  Administered 2018-10-09 – 2018-10-10 (×2)
  Administered 2018-10-11 – 2018-10-17 (×8): 1000 mL
  Filled 2018-10-02 (×21): qty 1000

## 2018-10-02 MED ORDER — PRO-STAT SUGAR FREE PO LIQD
30.0000 mL | Freq: Two times a day (BID) | ORAL | Status: DC
  Administered 2018-10-02 – 2018-11-09 (×76): 30 mL
  Filled 2018-10-02 (×75): qty 30

## 2018-10-02 NOTE — Progress Notes (Signed)
PROGRESS NOTE    Micheal Carey  PRF:[id number] DOB: 03/25/1950 DOA: 09/08/2018 PCP: [name], MD   Brief Narrative: As per HPI: 68 year old male found in parking lot with PEA arrest, unknown downtime require mechanical ventilation, UDS was positive for opioids and alcohol level was 276.  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 Patient could not be extubated and subsequently was trached on 8/17. -Status post PEG tube on 8/19.. -PCCM following and managing vent, sedation, weaning trials. -Transfer to [facility] service on 8/21. [facility] course complicated by agitation and intermittent vomiting.  Recurrent pneumonia and now UTI. The Social worker consulted and following for disposition LTAC versus SNF 9/2- remain on vent support, encephalopathic  Subjective: On vent, overnight BP soft, this am BP stable (hold amlodipine) Opens eyes- non verbal not following any commands Rn has no concerns  Assessment & Plan:   Acute respiratory failure with hypoxia:Likely related to accidental opioid/alcohol overdose, in the background of COPD.  Continue on trach (8/17), with vent support-continue vent with weaning trial per PCCM,who are following twice a week,  appreciate input.  Sedation per critical care, currently on oxycodone Lyrica Seroquel Klonopin.  PEA arrest in parking lot with Severe anoxic encephalopathy: PEA arrest likely from overdose.  Status post trach, remains encephalopathic. Currently supportive care, looking into SNF versus LTAC placement with vent support. Evaluated by neurology, earlier this admission EEG and MRI findings suggestive of severe anoxic brain injury.This was discussed with family, prognosis felt to be very poor.Repeat EEG 8/20 suggestive of severe encephalopathy as well.Family wants full aggressive scope of treatment. he has been restless,and increased  oxycodone back to  10 mg.Nonverbal not following commands.  Pneumonia/Fever:Completed antibiotics. Zosyn from 8/12 through 8/13 and then Rocephin from 8/15 to 8/17. blood culture no growth to date. Had fever. sputum Growing acinetobacter and Citrobacter freundii both sensitive to cefepime-completed 8/28---8/31  And changed to meropenem 8/31 to cover pathogens from urine-continue 7 more days from 8/31.Last temp spike 100.7 8/31.  T-max 99.5 overnight.  Dysphagia/intermittent episodes of vomiting: Status post PEG tube 8/19. TF had to be held during the course-restarted tube feedings on 8/23, and tolerating at 40 cc/h.  KUB with no ileus, having bowel movement, off Reglan.  Diarrhea resolved  UTI with Enterobacter species and Citrobacter.    Continue meropenem for 7 more days till 9/6 to cover UTI and PNA.   Hypernatremia/hypokalemia:resolved on free water  Flush 150 mL  HTN;  blood pressure has been soft, stop Norvasc, continue carvedilol   Alcohol abuse: out of window for withdrawal. Continue thiamine  COPD, stable nebulizers as needed.  Continue vent support  Goals of care: Per CCM felt to have very poor prognosis with severe anoxic encephalopathy, with no downtime following cardiac arrest, based on EEG and MRI and neurology recommendation. Per discussion have been held with family, they want to proceed with full scope of treatment will need vent in SNF or LTAC   DVT prophylaxis: Lovenox Code Status: Full code Family Communication: Son was updated over phone 8/31 Disposition Plan: We will need SNF versus LTAC.  Case manager working on it. D/c wheN available if okay w PCCM.  Continues to require ICU level of care due to vent Consultants:   CCM following  Procedures:   None  Nutrition: Nutrition Problem: Moderate Malnutrition Etiology: social / environmental circumstances(Hx substance abuse) Signs/Symptoms: moderate muscle depletion, moderate fat depletion Interventions: Tube feeding  Pressure  Ulcer: Pressure Injury 09/24/18 Heel Left Deep Tissue Injury - Purple or maroon localized area of discolored intact skin or blood-filled blister due to damage of underlying soft tissue from pressure and/or shear. (Active)  09/24/18 1115  Location: Heel  Location Orientation: Left  Staging: Deep Tissue Injury - Purple or maroon localized area of discolored intact skin or blood-filled blister due to damage of underlying soft tissue from pressure and/or shear.  Wound Description (Comments):   Present on Admission: No     Pressure Injury 09/24/18 Right Deep Tissue Injury - Purple or maroon localized area of discolored intact skin or blood-filled blister due to damage of underlying soft tissue from pressure and/or shear. (Active)  09/24/18 1120  Location:   Location Orientation: Right  Staging: Deep Tissue Injury - Purple or maroon localized area of discolored intact skin or blood-filled blister due to damage of underlying soft tissue from pressure and/or shear.  Wound Description (Comments):   Present on Admission: No    Body mass index is 20.41 kg/m.    Antimicrobials: Anti-infectives (From admission, onward)   Start     Dose/Rate Route Frequency Ordered Stop   09/30/18 1400  meropenem (MERREM) 1 g in sodium chloride 0.9 % 100 mL IVPB     1 g 200 mL/hr over 30 Minutes Intravenous Every 8 hours 09/30/18 1119 10/07/18 1359   09/27/18 1200  ceFEPIme (MAXIPIME) 2 g in sodium chloride 0.9 % 100 mL IVPB  Status:  Discontinued     2 g 200 mL/hr over 30 Minutes Intravenous Every 8 hours 09/27/18 1133 09/30/18 1119   09/18/18 1330  ceFAZolin (ANCEF) IVPB 2g/100 mL premix     2 g 200 mL/hr over 30 Minutes Intravenous To Radiology 09/18/18 1323 09/18/18 1356   09/17/18 0900  ceFAZolin (ANCEF) IVPB 2g/100 mL premix     2 g 200 mL/hr over 30 Minutes Intravenous To Radiology 09/17/18 0849 09/18/18 0900   09/14/18 1000  cefTRIAXone (ROCEPHIN) 2 g in sodium chloride 0.9 % 100 mL IVPB  Status:   Discontinued     2 g 200 mL/hr over 30 Minutes Intravenous Every 24 hours 09/14/18 0919 09/16/18 1040   09/12/18 0000  vancomycin (VANCOCIN) 1,250 mg in sodium chloride 0.9 % 250 mL IVPB  Status:  Discontinued     1,250 mg 166.7 mL/hr over 90 Minutes Intravenous Every 24 hours 09/11/18 0044 09/11/18 1229   09/12/18 0000  vancomycin (VANCOCIN) IVPB 1000 mg/200 mL premix  Status:  Discontinued     1,000 mg 200 mL/hr over 60 Minutes Intravenous Every 24 hours 09/11/18 1229 09/13/18 1117   09/11/18 0800  piperacillin-tazobactam (ZOSYN) IVPB 3.375 g  Status:  Discontinued     3.375 g 12.5 mL/hr over 240 Minutes Intravenous Every 8 hours 09/11/18 0044 09/13/18 1117   09/11/18 0045  piperacillin-tazobactam (ZOSYN) IVPB 3.375 g     3.375 g 100 mL/hr over 30 Minutes Intravenous  Once 09/11/18 0040 09/11/18 0232   09/11/18 0045  vancomycin (VANCOCIN) 1,500 mg in sodium chloride 0.9 % 500 mL IVPB     1,500 mg 250 mL/hr over 120 Minutes Intravenous  Once 09/11/18 0040 09/11/18 0447       Objective: Vitals:   10/02/18 0600 10/02/18 0700 10/02/18 0747 10/02/18 0800  BP: (!) 88/58 (!) 94/59 (!) 94/59 105/69  Pulse: 82 77 82 80  Resp: _0 Temp:  98.4 F (36.9 C)    TempSrc:  Axillary    SpO2: 100%  99% 100% 99%  Weight:      Height:        Intake/Output Summary (Last 24 hours) at 10/02/2018 0914 Last data filed at 10/02/2018 0600 Gross per 24 hour  Intake 1971.88 ml  Output 1150 ml  Net 821.88 ml   Filed Weights   09/30/18 0334 10/01/18 0419 10/02/18 0333  Weight: 61.3 kg 59.7 kg 60.9 kg   Weight change: 1.2 kg  Body mass index is 20.41 kg/m.  Intake/Output from previous day: 09/01 0701 - 09/02 0700 In: 2835.9 [I.V.:96.5; NG/GT:2085; IV Piggyback:324.4] Out: 1550 [Urine:1550] Intake/Output this shift: No intake/output data recorded.  Examination:  General exam:  Not responding besides opening eyes, Appears thin frail, older than stated age HEENT:PERRL,Oral mucosa  moist, Ear/Nose normal on gross exam Respiratory system: Bilateral equal air entry, normal vesicular breath sounds, no wheezes or crackles on vent-trach+ Cardiovascular system: S1 & S2 heard,No JVD, murmurs. Gastrointestinal system: Abdomen is  soft, PEG+, non distended, BS +  Nervous System_ not following any commands, opens eyes,limited exam, unresponsive Extremities: No edema, no clubbing, distal peripheral pulses palpable. Skin: No rashes, lesions, no icterus MSK: thin,weak  Medications:  Scheduled Meds: . amLODipine  5 mg Per Tube Daily  . carvedilol  6.25 mg Per Tube BID WC  . chlorhexidine gluconate (MEDLINE KIT)  15 mL Mouth Rinse BID  . Chlorhexidine Gluconate Cloth  6 each Topical Q0600  . clonazePAM  1 mg Per Tube TID  . enoxaparin (LOVENOX) injection  40 mg Subcutaneous Q24H  . feeding supplement (JEVITY 1.2 CAL)  1,000 mL Per Tube Q24H  . feeding supplement (PRO-STAT SUGAR FREE 64)  30 mL Per Tube TID  . folic acid  1 mg Per Tube Daily  . free water  150 mL Per Tube Q6H  . insulin aspart  2-6 Units Subcutaneous Q4H  . mouth rinse  15 mL Mouth Rinse 10 times per day  . multivitamin  15 mL Per Tube Daily  . oxyCODONE  10 mg Per Tube Q6H  . pantoprazole sodium  40 mg Per Tube Q24H  . pregabalin  25 mg Per Tube BID  . QUEtiapine  25 mg Per Tube BID  . thiamine  100 mg Per Tube Daily   Continuous Infusions: . sodium chloride Stopped (10/01/18 0747)  . meropenem (MERREM) IV Stopped (10/02/18 0533)    Data Reviewed: I have personally reviewed following labs and imaging studies  CBC: Recent Labs  Lab 09/27/18 0245 09/28/18 0820 09/30/18 0841 10/01/18 0254 10/02/18 0638  WBC 10.4 9.0 10.2 10.7* 8.2  HGB 10.0* 9.6* 10.1* 9.7* 8.9*  HCT 31.7* 29.2* 31.2* 30.4* 28.0*  MCV 87.6 85.4 85.0 86.1 86.7  PLT 463* 410* 433* 442* 010   Basic Metabolic Panel: Recent Labs  Lab 09/27/18 0245 09/28/18 0820 09/29/18 0428 09/30/18 0841 10/01/18 0254 10/02/18 0638  NA  141 138 139 137 136 138  K 3.8 4.2 4.1 4.3 3.9 4.1  CL 107 106 108 103 102 104  CO2 _0 GLUCOSE 128* 125* 114* 116* 120* 96  BUN _1 CREATININE 0.99 0.93 0.77 0.88 0.89 1.00  CALCIUM 8.7* 8.7* 8.9 9.0 9.0 8.7*  MG 2.0  --   --   --   --   --    GFR: Estimated Creatinine Clearance: 62.6 mL/min (by C-G formula based on SCr of 1 mg/dL). Liver Function Tests: Recent Labs  Lab 10/02/18 0638  AST  35  ALT 19  ALKPHOS 118  BILITOT 0.4  PROT 6.1*  ALBUMIN 2.3*   No results for input(s): LIPASE, AMYLASE in the last 168 hours. Recent Labs  Lab 09/26/18 0316  AMMONIA 26   Coagulation Profile: No results for input(s): INR, PROTIME in the last 168 hours. Cardiac Enzymes: No results for input(s): CKTOTAL, CKMB, CKMBINDEX, TROPONINI in the last 168 hours. BNP (last 3 results) No results for input(s): PROBNP in the last 8760 hours. HbA1C: No results for input(s): HGBA1C in the last 72 hours. CBG: Recent Labs  Lab 10/01/18 1458 10/01/18 2015 10/01/18 2333 10/02/18 0332 10/02/18 0724  GLUCAP 115* 118* 99 94 87   Lipid Profile: No results for input(s): CHOL, HDL, LDLCALC, TRIG, CHOLHDL, LDLDIRECT in the last 72 hours. Thyroid Function Tests: No results for input(s): TSH, T4TOTAL, FREET4, T3FREE, THYROIDAB in the last 72 hours. Anemia Panel: No results for input(s): VITAMINB12, FOLATE, FERRITIN, TIBC, IRON, RETICCTPCT in the last 72 hours. Sepsis Labs: No results for input(s): PROCALCITON, LATICACIDVEN in the last 168 hours.  Recent Results (from the past 240 hour(s))  Culture, blood (routine x 2)     Status: None   Collection Time: 09/25/18  9:11 PM   Specimen: BLOOD  Result Value Ref Range Status   Specimen Description BLOOD LEFT HAND  Final   Special Requests   Final    BOTTLES DRAWN AEROBIC ONLY Blood Culture adequate volume   Culture   Final    NO GROWTH 5 DAYS Performed at [facility], [address], [place], [place] [address]     Report Status 09/30/2018 FINAL  Final  Culture, blood (routine x 2)     Status: None   Collection Time: 09/25/18  9:16 PM   Specimen: BLOOD  Result Value Ref Range Status   Specimen Description BLOOD LEFT HAND  Final   Special Requests   Final    BOTTLES DRAWN AEROBIC ONLY Blood Culture adequate volume   Culture   Final    NO GROWTH 5 DAYS Performed at [facility], [place] [address] [place], [place] [address]    Report Status 09/30/2018 FINAL  Final  Culture, respiratory (non-expectorated)     Status: None   Collection Time: 09/26/18  9:01 AM   Specimen: Tracheal Aspirate; Respiratory  Result Value Ref Range Status   Specimen Description TRACHEAL ASPIRATE  Final   Special Requests NONE  Final   Gram Stain   Final    FEW WBC PRESENT, PREDOMINANTLY PMN RARE GRAM POSITIVE COCCI RARE GRAM NEGATIVE RODS Performed at [facility], [place] [address] [place], [place] [address]    Culture   Final    FEW ACINETOBACTER SPECIES FEW CITROBACTER FREUNDII    Report Status 09/29/2018 FINAL  Final   Organism ID, Bacteria ACINETOBACTER SPECIES  Final   Organism ID, Bacteria CITROBACTER FREUNDII  Final      Susceptibility   Acinetobacter species - MIC*    CEFTAZIDIME 4 SENSITIVE Sensitive     CEFTRIAXONE 16 INTERMEDIATE Intermediate     CIPROFLOXACIN <=0.25 SENSITIVE Sensitive     GENTAMICIN <=1 SENSITIVE Sensitive     IMIPENEM <=0.25 SENSITIVE Sensitive     PIP/TAZO <=4 SENSITIVE Sensitive     TRIMETH/SULFA <=20 SENSITIVE Sensitive     CEFEPIME 2 SENSITIVE Sensitive     AMPICILLIN/SULBACTAM <=2 SENSITIVE Sensitive     * FEW ACINETOBACTER SPECIES   Citrobacter freundii - MIC*    CEFAZOLIN >=64  RESISTANT Resistant     CEFEPIME <=1 SENSITIVE Sensitive     CEFTAZIDIME >=64 RESISTANT Resistant     CEFTRIAXONE >=64 RESISTANT Resistant     CIPROFLOXACIN <=0.25 SENSITIVE Sensitive     GENTAMICIN <=1 SENSITIVE Sensitive     IMIPENEM <=0.25 SENSITIVE Sensitive     TRIMETH/SULFA  <=20 SENSITIVE Sensitive     PIP/TAZO >=128 RESISTANT Resistant     * FEW CITROBACTER FREUNDII  Urine Culture     Status: Abnormal   Collection Time: 09/26/18 12:30 PM   Specimen: Urine, Random  Result Value Ref Range Status   Specimen Description URINE, RANDOM  Final   Special Requests   Final    NONE Performed at [facility], [place] [address] [place], [place] [address]    Culture (A)  Final    40,000 COLONIES/mL MULTIPLE SPECIES PRESENT, SUGGEST RECOLLECTION   Report Status 09/27/2018 FINAL  Final  Urine Culture     Status: Abnormal   Collection Time: 09/27/18  1:59 PM   Specimen: Urine, Random  Result Value Ref Range Status   Specimen Description URINE, RANDOM  Final   Special Requests   Final    NONE Performed at [facility], [place] [address], [place], [place] [address]    Culture (A)  Final    >=100,000 COLONIES/mL ENTEROBACTER ASBURIAE >=100,000 COLONIES/mL CITROBACTER FREUNDII    Report Status 09/30/2018 FINAL  Final   Organism ID, Bacteria ENTEROBACTER ASBURIAE (A)  Final   Organism ID, Bacteria CITROBACTER FREUNDII (A)  Final      Susceptibility   Citrobacter freundii - MIC*    CEFAZOLIN >=64 RESISTANT Resistant     CEFTRIAXONE >=64 RESISTANT Resistant     CIPROFLOXACIN <=0.25 SENSITIVE Sensitive     GENTAMICIN <=1 SENSITIVE Sensitive     IMIPENEM 0.5 SENSITIVE Sensitive     NITROFURANTOIN <=16 SENSITIVE Sensitive     TRIMETH/SULFA <=20 SENSITIVE Sensitive     PIP/TAZO >=128 RESISTANT Resistant     * >=100,000 COLONIES/mL CITROBACTER FREUNDII   Enterobacter asburiae - MIC*    CEFAZOLIN >=64 RESISTANT Resistant     CEFTRIAXONE >=64 RESISTANT Resistant     CIPROFLOXACIN <=0.25 SENSITIVE Sensitive     GENTAMICIN <=1 SENSITIVE Sensitive     IMIPENEM 0.5 SENSITIVE Sensitive     NITROFURANTOIN 64 INTERMEDIATE Intermediate     TRIMETH/SULFA 40 SENSITIVE Sensitive     PIP/TAZO >=128 RESISTANT Resistant     * >=100,000 COLONIES/mL ENTEROBACTER ASBURIAE       Radiology Studies: No results found.    LOS: 24 days   Time spent: More than 50% of that time was spent in counseling and/or coordination of care.  Antonieta Pert, MD [facility]  10/02/2018, 9:14 AM

## 2018-10-02 NOTE — Progress Notes (Signed)
SLP Cancellation Note  Patient Details Name: Emmauel Hallums MRN: [id number] DOB: [birth year]   Cancelled treatment:       Reason Eval/Treat Not Completed: Medical issues which prohibited therapy - pt is not ready for SLP services. Will sign off at this time. Please reorder when ready.   Venita Sheffield Harmonii Karle 10/02/2018, 7:44 AM  Pollyann Glen, M.A. [place] Acute Environmental education officer [phone] Office [phone]

## 2018-10-02 NOTE — Progress Notes (Signed)
NAME:  Micheal Carey, MRN:  [id number], DOB:  10/09/1950, LOS: 24 ADMISSION DATE:  09/08/2018, CONSULTATION DATE:  09/08/2018 REFERRING MD:  Dr. Darl Householder, ER, CHIEF COMPLAINT:  AMS   Brief History   68 yo male found in parking lot with PEA arrest with unknown downtime.  Required intubation.  UDS positive for opiates and ETOH 276.  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  Past Medical History  COPD, ETOH, opiate abuse  Significant Diagnostic Tests:  Echo 8/10 >> EF 60 to 65%, mild MR EEG 8/12 >> profound, severe encephalopathy MRI brain 8/12 >> hypoxic/ischemic injury, moderate chronic small vessel ischemic disease EEG 8/20 >> severe encephalopathy  Micro Data:  COVID 8/09 >> negative Blood 8/09 >> negative Blood 8/12 >>NTD Sputum 8/12 >> Strep pneumoniae Urine 8/28 >> enterobacter asburiae and citrobacter Sputum 8/28 >> acinetobacter and citrobacter   Antimicrobials:  Vancomycin 8/11  Zosyn 8/11 >> 8/13 Rocephin 8/15 >> 8/17 Meropenem 8/31>>>  Interim history/subjective:  Tolerating pressure support intermittently.  Objective   Blood pressure (!) 94/59, pulse 82, temperature 98.4 F (36.9 C), temperature source Axillary, resp. rate 14, height 5\' 8"  (1.727 m), weight 60.9 kg, SpO2 100 %.    Vent Mode: CPAP;PSV FiO2 (%):  [30 %] 30 % Set Rate:  [12 bmp] 12 bmp Vt Set:  [540 mL] 540 mL PEEP:  [5 cmH20] 5 cmH20 Pressure Support:  [10 VHQ46-96 cmH20] 10 cmH20 Plateau Pressure:  [18 cmH20-21 cmH20] 18 cmH20   Intake/Output Summary (Last 24 hours) at 10/02/2018 0801 Last data filed at 10/02/2018 0600 Gross per 24 hour  Intake 2835.88 ml  Output 1550 ml  Net 1285.88 ml   Filed Weights   09/30/18 0334 10/01/18 0419 10/02/18 0333  Weight: 61.3 kg 59.7 kg 60.9 kg   Examination:  General - on vent Eyes - pupils reactive ENT - trach site clean Cardiac - regular rate/rhythm, no murmur Chest - equal breath sounds b/l, no wheezing or rales Abdomen -  soft, non tender, + bowel sounds Extremities - no cyanosis, clubbing, or edema Skin - no rashes Neuro - opens eyes with stimulation, not following commands  Resolved Hospital Problem list   Acute hypoxic resp failure, Pneumococcal PNA, PEA cardiac arrest after opiate/ETOH induced respiratory failure, Hypernatremia,Thrush  Assessment & Plan:   Chronic respiratory failure with inability to protect airway after cardiac arrest. Tracheostomy status. Failure to wean from ventilator. Hx of COPD. - pressure support wean as able - trach care - f/u CXR intermittently - prn BDs  Recurrent HCAP with acinetobacter and citrobacter. - day 3/7 of meropenem  Persistent vegetative state. - family wishes to continue aggressive care - defer adjustment of klonopin, oxycodone, lyrica, seroquel to primary team   Best practice:  Diet: tube feeds DVT prophylaxis: lovenox GI prophylaxis: protonix Mobility: bed rest Code Status: full code Disposition: ICU  Labs:   CMP Latest Ref Rng & Units 10/02/2018 10/01/2018 09/30/2018  Glucose 70 - 99 mg/dL 96 120(H) 116(H)  BUN 8 - 23 mg/dL 22 20 17   Creatinine 0.61 - 1.24 mg/dL 1.00 0.89 0.88  Sodium 135 - 145 mmol/L 138 136 137  Potassium 3.5 - 5.1 mmol/L 4.1 3.9 4.3  Chloride 98 - 111 mmol/L 104 102 103  CO2 22 - 32 mmol/L 24 24 25   Calcium 8.9 - 10.3 mg/dL 8.7(L) 9.0 9.0  Total Protein 6.5 - 8.1 g/dL 6.1(L) - -  Total Bilirubin 0.3 - 1.2 mg/dL 0.4 - -  Alkaline Phos  38 - 126 U/L 118 - -  AST 15 - 41 U/L 35 - -  ALT 0 - 44 U/L 19 - -   CBC Latest Ref Rng & Units 10/02/2018 10/01/2018 09/30/2018  WBC 4.0 - 10.5 K/uL 8.2 10.7(H) 10.2  Hemoglobin 13.0 - 17.0 g/dL 1.6(X8.9(L) 0.9(U9.7(L) 10.1(L)  Hematocrit 39.0 - 52.0 % 28.0(L) 30.4(L) 31.2(L)  Platelets 150 - 400 K/uL 385 442(H) 433(H)   ABG    Component Value Date/Time   PHART 7.506 (H) 09/10/2018 0359   PCO2ART 32.5 09/10/2018 0359   PO2ART 177.0 (H) 09/10/2018 0359   HCO3 25.7 09/10/2018 0359   TCO2 27  09/10/2018 0359   ACIDBASEDEF 1.0 09/09/2018 0427   O2SAT 100.0 09/10/2018 0359   CBG (last 3)  Recent Labs    10/01/18 2333 10/02/18 0332 10/02/18 0724  GLUCAP 99 94 87   .Coralyn HellingVineet Zareena Willis, MD [facility] 10/02/2018, 8:10 AM

## 2018-10-02 NOTE — Progress Notes (Signed)
Assisted tele visit to patient with daughter.  Tredarius Cobern M Tashayla Therien, RN   

## 2018-10-02 NOTE — Progress Notes (Signed)
Nutrition Follow-up  DOCUMENTATION CODES:   Non-severe (moderate) malnutrition in context of social or environmental circumstances  INTERVENTION:   Increase tube feeding:  -Jevity 1.2 @ 60 ml/hr via PEG -30 ml Prostat BID -Free water 150 ml QID- per CCM  Provides: 1928 kcals, 110 grams protein, 1162 ml free water (1762 ml with flushes). Meets 100% of needs.   NUTRITION DIAGNOSIS:   Moderate Malnutrition related to social / environmental circumstances(Hx substance abuse) as evidenced by moderate muscle depletion, moderate fat depletion.  Ongoing  GOAL:   Patient will meet greater than or equal to 90% of their needs  Addressed via TF  MONITOR:   Vent status, TF tolerance, Labs  REASON FOR ASSESSMENT:   Consult Enteral/tube feeding initiation and management  ASSESSMENT:   68 yo male admitted with PEA arrest after being found down in a parking lot. Urine screen positive for opiates, ETOH positive. PMH includes alcohol and opiate abuse, CAP.   8/17- tracheostomy  8/19- PEG placed  8/20- TF held due to vomiting 8/21- TF restarted at trickle  RD working remotely.  Pt requiring pressors intermittently. Awaiting placement. Weight down. Tolerating TF at current rate per RN. Plan to increase today to maximize kcal/protein given new DTIs.   Admission weight: 65.9 kg Current weight: 60.9 kg   Patient requiring ventilator support via trach  MV: 6.9 L/min Temp (24hrs), Avg:98.7 F (37.1 C), Min:97.8 F (36.6 C), Max:99.5 F (37.5 C)   I/O: +2,834 ml since 8/19 UOP: 1,550 ml x 24 hrs   Medications: folic acid, SS novolog, liquid MVI, thiamine Labs: CBG 87-118  Diet Order:   Diet Order    None      EDUCATION NEEDS:   No education needs have been identified at this time  Skin:  Skin Assessment: Skin Integrity Issues: Skin Integrity Issues:: DTI DTI: L heel, R leg  Last BM:  8/31  Height:   Ht Readings from Last 1 Encounters:  09/08/18 5\' 8"  (1.727 m)     Weight:   Wt Readings from Last 1 Encounters:  10/02/18 60.9 kg    Ideal Body Weight:  70 kg  BMI:  Body mass index is 20.41 kg/m.  Estimated Nutritional Needs:   Kcal:  1750-1950 kcal  Protein:  105-120 grams  Fluid:  >/= 1.7 L/day   Micheal Carey RD, LDN Clinical Nutrition Pager # - [phone]

## 2018-10-02 NOTE — Progress Notes (Signed)
Occupational Therapy Treatment and Discharge Patient Details Name: Micheal Carey MRN: [id number] DOB: Jan 27, 1951 Today's Date: 10/02/2018    History of present illness Pt is a 68 year old man admitted 09/08/18 after found down with PEA arrest, unknown down time. Pt intubated. +severe hypoxic encephalopathy, ETOH and opiates.   OT comments  Bilateral resting hand splints continue to fit well with no concerns. Performed PROM B UEs and positioned pt's head at neutral. Pt responds by opening his eyes to voice and grimaces to pain. He continues to be unable to follow commands and requires total care. Pt demonstrates no appreciable progress in a reasonable amount of time. OT is signing off. No family available to participate in education discharge plan.   Follow Up Recommendations  SNF;LTACH;Supervision/Assistance - 24 hour    Equipment Recommendations       Recommendations for Other Services      Precautions / Restrictions Precautions Precautions: Fall;Other (comment) Precaution Comments: trach/ventilator, rectal pouch Required Braces or Orthoses: Other Brace Other Brace: no difficulties noted with B resting hand splints or reported by RN       Mobility Bed Mobility Overal bed mobility: Needs Assistance Bed Mobility: Rolling Rolling: Total assist;+2 for physical assistance            Transfers                      Balance                                           ADL either performed or assessed with clinical judgement   ADL                                         General ADL Comments: requires total care     Vision       Perception     Praxis      Cognition Arousal/Alertness: Lethargic Behavior During Therapy: Flat affect Overall Cognitive Status: Impaired/Different from baseline                                 General Comments: pt opens his eyes when spoken to, continues to not follow commands,  grimaces to pain        Exercises General Exercises - Upper Extremity Shoulder Flexion: PROM;Both;10 reps;Supine Shoulder ABduction: PROM;Both;10 reps;Supine Shoulder Horizontal ABduction: PROM;Both;10 reps;Supine Shoulder Horizontal ADduction: PROM;Both;10 reps;Supine Elbow Flexion: PROM;Both;10 reps;Supine Elbow Extension: PROM;Both;10 reps;Supine Wrist Flexion: PROM;Both;10 reps;Supine Wrist Extension: PROM;Both;10 reps;Supine Composite Extension: PROM;Both;5 reps;Supine   Shoulder Instructions       General Comments      Pertinent Vitals/ Pain       Pain Assessment: Faces Faces Pain Scale: Hurts little more Pain Location: neck with repositioning Pain Descriptors / Indicators: Grimacing Pain Intervention(s): Monitored during session;Repositioned  Home Living                                          Prior Functioning/Environment              Frequency           Progress  Toward Goals  OT Goals(current goals can now be found in the care plan section)  Progress towards OT goals: Goals met/education completed, patient discharged from OT  Acute Rehab OT Goals Patient Stated Goal: unable to state OT Goal Formulation: Patient unable to participate in goal setting  Plan Discharge plan remains appropriate    Co-evaluation                 AM-PAC OT "6 Clicks" Daily Activity     Outcome Measure   Help from another person eating meals?: Total Help from another person taking care of personal grooming?: Total Help from another person toileting, which includes using toliet, bedpan, or urinal?: Total Help from another person bathing (including washing, rinsing, drying)?: Total Help from another person to put on and taking off regular upper body clothing?: Total Help from another person to put on and taking off regular lower body clothing?: Total 6 Click Score: 6    End of Session    OT Visit Diagnosis: Muscle weakness (generalized)  (M62.81)   Activity Tolerance Patient tolerated treatment well   Patient Left in bed;with call bell/phone within reach;with bed alarm set   Nurse Communication          Time: 4709-2957 OT Time Calculation (min): 12 min  Charges: OT General Charges $OT Visit: 1 Visit OT Treatments $Therapeutic Exercise: 8-22 mins  Nestor Lewandowsky, OTR/L Acute Rehabilitation Services Pager: [phone] Office: [phone]   Malka So 10/02/2018, 2:34 PM

## 2018-10-03 DIAGNOSIS — N390 Urinary tract infection, site not specified: Secondary | ICD-10-CM

## 2018-10-03 LAB — GLUCOSE, CAPILLARY
Glucose-Capillary: 105 mg/dL — ABNORMAL HIGH (ref 70–99)
Glucose-Capillary: 113 mg/dL — ABNORMAL HIGH (ref 70–99)
Glucose-Capillary: 101 mg/dL — ABNORMAL HIGH (ref 70–99)
Glucose-Capillary: 105 mg/dL — ABNORMAL HIGH (ref 70–99)
Glucose-Capillary: 117 mg/dL — ABNORMAL HIGH (ref 70–99)

## 2018-10-03 NOTE — Progress Notes (Signed)
CSW spoke with Denyse Amass, Education officer, museum at [facility] to inform her of recent developments with the patient's long term Medicaid. Denyse Amass to have [employer] office reach out to [place] for a pending LTM number. Denyse Amass also confirmed there is a bed available for this patient as soon as the LTM is authorized.  Madilyn Fireman, MSW, LCSW-A Clinical Social Worker [facility] [phone]

## 2018-10-03 NOTE — Plan of Care (Signed)
  Problem: Clinical Measurements: Goal: Will remain free from infection Outcome: Progressing Goal: Diagnostic test results will improve Outcome: Progressing Goal: Respiratory complications will improve Outcome: Progressing Goal: Cardiovascular complication will be avoided Outcome: Progressing   Problem: Nutrition: Goal: Adequate nutrition will be maintained Outcome: Progressing   Problem: Coping: Goal: Level of anxiety will decrease Outcome: Progressing   Problem: Elimination: Goal: Will not experience complications related to bowel motility Outcome: Progressing Goal: Will not experience complications related to urinary retention Outcome: Progressing   Problem: Safety: Goal: Ability to remain free from injury will improve Outcome: Progressing   Problem: Education: Goal: Knowledge of General Education information will improve Description: Including pain rating scale, medication(s)/side effects and non-pharmacologic comfort measures Outcome: Not Progressing   Problem: Health Behavior/Discharge Planning: Goal: Ability to manage health-related needs will improve Outcome: Not Progressing   Problem: Activity: Goal: Risk for activity intolerance will decrease Outcome: Not Progressing

## 2018-10-03 NOTE — Progress Notes (Signed)
PROGRESS NOTE    Micheal Carey  CXK:[id number] DOB: Apr 29, 1950 DOA: 09/08/2018 PCP: [name], MD   Brief Narrative: As per HPI: 68 year old male found in parking lot with PEA arrest, unknown downtime require mechanical ventilation, UDS was positive for opioids and alcohol level was 276.  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 Patient could not be extubated and subsequently was trached on 8/17. -Status post PEG tube on 8/19.. -PCCM following and managing vent, sedation, weaning trials. -Transfer to [facility] service on 8/21. [facility] course complicated by agitation and intermittent vomiting.  Recurrent pneumonia and now UTI. The Social worker consulted and following for disposition LTAC versus SNF 9/2- remains on vent support, encephalopathic 9/3- started on trach collar  Subjective: No acute events overnight started on Trach collar this am Remains unresponsive tmax 99.4  Assessment & Plan:   Acute respiratory failure with hypoxia:Likely related to accidental opioid/alcohol overdose, in the background of COPD.  Continue on trach (8/17), with vent support-continue vent with weaning trial per PCCM,who are following twice a week-this morning placed on trach collar weaning.Sedation per critical care, currently on oxycodone Lyrica Seroquel Klonopin.  PEA arrest in parking lot with Severe anoxic encephalopathy: PEA arrest likely from overdose.  Status post trach, remains encephalopathic. Currently supportive care, looking into SNF versus LTAC placement with vent support. Evaluated by neurology, earlier this admission EEG and MRI findings suggestive of severe anoxic brain injury.This was discussed with family, prognosis felt to be very poor.Repeat EEG 8/20 suggestive of severe encephalopathy as well.Family wants full aggressive scope of treatment. he has been restless,and increased   oxycodone back to 10 mg.Nonverbal not following commands.  Pneumonia/Fever:Completed antibiotics. Zosyn from 8/12 through 8/13 and then Rocephin from 8/15 to 8/17. blood culture no growth to date. Had fever. sputum Growing acinetobacter and Citrobacter freundii both sensitive to cefepime-completed 8/28---8/31  And changed to meropenem 8/31 to cover pathogens from urine-continue 7 more days from 8/31.currently afebrile.  Continue the same.  Dysphagia/intermittent episodes of vomiting: Status post PEG tube 8/19. TF had to be held during the course-restarted tube feedings on 8/23, and tolerating at 45 cc/h.  KUB with no ileus, having bowel movement, off Reglan.  Diarrhea resolved.  We will keep Tube feed at current rate as he did not tolerate increased rate.  Appreciate nutrition input.  UTI with Enterobacter species and Citrobacter.  Continue meropenem for 7 more days till 9/6 to cover UTI and PNA.   Hypernatremia/hypokalemia:.  Sodium improved,Potassium normal.  Continue free water flush.  HFW:YOVZC pressure has been soft, stop now stable after stopping Norvasc.  Continue carvedilol.    Alcohol abuse: out of window for withdrawal. Continue thiamine  COPD, continue vent as above, continue nebs  Goals of care: Per CCM felt to have very poor prognosis with severe anoxic encephalopathy, with no downtime following cardiac arrest, based on EEG and MRI and neurology recommendation. Per discussion have been held with family, they want to proceed with full scope of treatment will need vent in SNF.  CM working on placement waiting for Medicaid number  DVT prophylaxis: Lovenox Code Status: Full code Family Communication: Son was updated over phone 8/31 Disposition Plan: We will need SNF w vent or LTACH. Case manager working on it. D/c when available if okay w PCCM.   Consultants:   CCM following  Procedures:   None  Nutrition: Nutrition Problem: Moderate Malnutrition Etiology: social /  environmental  circumstances(Hx substance abuse) Signs/Symptoms: moderate muscle depletion, moderate fat depletion Interventions: Tube feeding  Pressure Ulcer: Pressure Injury 09/24/18 Heel Left Deep Tissue Injury - Purple or maroon localized area of discolored intact skin or blood-filled blister due to damage of underlying soft tissue from pressure and/or shear. (Active)  09/24/18 1115  Location: Heel  Location Orientation: Left  Staging: Deep Tissue Injury - Purple or maroon localized area of discolored intact skin or blood-filled blister due to damage of underlying soft tissue from pressure and/or shear.  Wound Description (Comments):   Present on Admission: No     Pressure Injury 09/24/18 Right Deep Tissue Injury - Purple or maroon localized area of discolored intact skin or blood-filled blister due to damage of underlying soft tissue from pressure and/or shear. (Active)  09/24/18 1120  Location:   Location Orientation: Right  Staging: Deep Tissue Injury - Purple or maroon localized area of discolored intact skin or blood-filled blister due to damage of underlying soft tissue from pressure and/or shear.  Wound Description (Comments):   Present on Admission: No    Body mass index is 20.75 kg/m.    Antimicrobials: Anti-infectives (From admission, onward)   Start     Dose/Rate Route Frequency Ordered Stop   09/30/18 1400  meropenem (MERREM) 1 g in sodium chloride 0.9 % 100 mL IVPB     1 g 200 mL/hr over 30 Minutes Intravenous Every 8 hours 09/30/18 1119 10/07/18 1359   09/27/18 1200  ceFEPIme (MAXIPIME) 2 g in sodium chloride 0.9 % 100 mL IVPB  Status:  Discontinued     2 g 200 mL/hr over 30 Minutes Intravenous Every 8 hours 09/27/18 1133 09/30/18 1119   09/18/18 1330  ceFAZolin (ANCEF) IVPB 2g/100 mL premix     2 g 200 mL/hr over 30 Minutes Intravenous To Radiology 09/18/18 1323 09/18/18 1356   09/17/18 0900  ceFAZolin (ANCEF) IVPB 2g/100 mL premix     2 g 200 mL/hr over 30  Minutes Intravenous To Radiology 09/17/18 0849 09/18/18 0900   09/14/18 1000  cefTRIAXone (ROCEPHIN) 2 g in sodium chloride 0.9 % 100 mL IVPB  Status:  Discontinued     2 g 200 mL/hr over 30 Minutes Intravenous Every 24 hours 09/14/18 0919 09/16/18 1040   09/12/18 0000  vancomycin (VANCOCIN) 1,250 mg in sodium chloride 0.9 % 250 mL IVPB  Status:  Discontinued     1,250 mg 166.7 mL/hr over 90 Minutes Intravenous Every 24 hours 09/11/18 0044 09/11/18 1229   09/12/18 0000  vancomycin (VANCOCIN) IVPB 1000 mg/200 mL premix  Status:  Discontinued     1,000 mg 200 mL/hr over 60 Minutes Intravenous Every 24 hours 09/11/18 1229 09/13/18 1117   09/11/18 0800  piperacillin-tazobactam (ZOSYN) IVPB 3.375 g  Status:  Discontinued     3.375 g 12.5 mL/hr over 240 Minutes Intravenous Every 8 hours 09/11/18 0044 09/13/18 1117   09/11/18 0045  piperacillin-tazobactam (ZOSYN) IVPB 3.375 g     3.375 g 100 mL/hr over 30 Minutes Intravenous  Once 09/11/18 0040 09/11/18 0232   09/11/18 0045  vancomycin (VANCOCIN) 1,500 mg in sodium chloride 0.9 % 500 mL IVPB     1,500 mg 250 mL/hr over 120 Minutes Intravenous  Once 09/11/18 0040 09/11/18 0447       Objective: Vitals:   10/03/18 0723 10/03/18 0742 10/03/18 0800 10/03/18 0802  BP:   (!) 126/95 (!) 126/95  Pulse:  94 95 97  Resp:  20 (!) 21   Temp: 98.2  F (36.8 C)     TempSrc: Axillary     SpO2:  100% 100%   Weight:      Height:        Intake/Output Summary (Last 24 hours) at 10/03/2018 0835 Last data filed at 10/03/2018 0819 Gross per 24 hour  Intake 1350 ml  Output 910 ml  Net 440 ml   Filed Weights   10/01/18 0419 10/02/18 0333 10/03/18 0443  Weight: 59.7 kg 60.9 kg 61.9 kg   Weight change: 1 kg  Body mass index is 20.75 kg/m.  Intake/Output from previous day: 09/02 0701 - 09/03 0700 In: 1200 [I.V.:30; NG/GT:690; IV Piggyback:200] Out: 650 [Urine:650] Intake/Output this shift: Total I/O In: 150 [NG/GT:150] Out: 260 [Urine:260]   Examination:  General exam: Does not respond, opens eyes appears thin frail, HEENT:PERRL,Oral mucosa moist, Ear/Nose normal on gross exam Respiratory system: Bilateral equal air entry, normal vesicular breath sounds, no wheezes or crackles on vent-trach+ Cardiovascular system: S1 & S2 heard,No JVD, murmurs. Gastrointestinal system: Abdomen is  soft, PEG+, non distended, BS +  Nervous System: Does not follow commands,limited exam, unresponsive Extremities: No edema, no clubbing, distal peripheral pulses palpable. Skin: No rashes, lesions, no icterus MSK: thin,weak  Medications:  Scheduled Meds: . carvedilol  6.25 mg Per Tube BID WC  . chlorhexidine gluconate (MEDLINE KIT)  15 mL Mouth Rinse BID  . Chlorhexidine Gluconate Cloth  6 each Topical Q0600  . clonazePAM  1 mg Per Tube TID  . enoxaparin (LOVENOX) injection  40 mg Subcutaneous Q24H  . feeding supplement (JEVITY 1.2 CAL)  1,000 mL Per Tube Q24H  . feeding supplement (PRO-STAT SUGAR FREE 64)  30 mL Per Tube BID  . folic acid  1 mg Per Tube Daily  . free water  150 mL Per Tube Q6H  . mouth rinse  15 mL Mouth Rinse 10 times per day  . multivitamin  15 mL Per Tube Daily  . oxyCODONE  10 mg Per Tube Q6H  . pantoprazole sodium  40 mg Per Tube Q24H  . pregabalin  25 mg Per Tube BID  . QUEtiapine  25 mg Per Tube BID  . thiamine  100 mg Per Tube Daily   Continuous Infusions: . sodium chloride Stopped (10/01/18 0747)  . meropenem (MERREM) IV Stopped (10/03/18 0539)    Data Reviewed: I have personally reviewed following labs and imaging studies  CBC: Recent Labs  Lab 09/27/18 0245 09/28/18 0820 09/30/18 0841 10/01/18 0254 10/02/18 0638  WBC 10.4 9.0 10.2 10.7* 8.2  HGB 10.0* 9.6* 10.1* 9.7* 8.9*  HCT 31.7* 29.2* 31.2* 30.4* 28.0*  MCV 87.6 85.4 85.0 86.1 86.7  PLT 463* 410* 433* 442* 721   Basic Metabolic Panel: Recent Labs  Lab 09/27/18 0245 09/28/18 0820 09/29/18 0428 09/30/18 0841 10/01/18 0254 10/02/18 0638   NA 141 138 139 137 136 138  K 3.8 4.2 4.1 4.3 3.9 4.1  CL 107 106 108 103 102 104  CO2 '25 23 23 25 24 24  ' GLUCOSE 128* 125* 114* 116* 120* 96  BUN '19 17 20 17 20 22  ' CREATININE 0.99 0.93 0.77 0.88 0.89 1.00  CALCIUM 8.7* 8.7* 8.9 9.0 9.0 8.7*  MG 2.0  --   --   --   --   --    GFR: Estimated Creatinine Clearance: 63.6 mL/min (by C-G formula based on SCr of 1 mg/dL). Liver Function Tests: Recent Labs  Lab 10/02/18 0638  AST 35  ALT 19  ALKPHOS  118  BILITOT 0.4  PROT 6.1*  ALBUMIN 2.3*   No results for input(s): LIPASE, AMYLASE in the last 168 hours. No results for input(s): AMMONIA in the last 168 hours. Coagulation Profile: No results for input(s): INR, PROTIME in the last 168 hours. Cardiac Enzymes: No results for input(s): CKTOTAL, CKMB, CKMBINDEX, TROPONINI in the last 168 hours. BNP (last 3 results) No results for input(s): PROBNP in the last 8760 hours. HbA1C: No results for input(s): HGBA1C in the last 72 hours. CBG: Recent Labs  Lab 10/02/18 0724 10/02/18 1149 10/02/18 1620 10/02/18 2217 10/03/18 0721  GLUCAP 87 117* 94 94 105*   Lipid Profile: No results for input(s): CHOL, HDL, LDLCALC, TRIG, CHOLHDL, LDLDIRECT in the last 72 hours. Thyroid Function Tests: No results for input(s): TSH, T4TOTAL, FREET4, T3FREE, THYROIDAB in the last 72 hours. Anemia Panel: No results for input(s): VITAMINB12, FOLATE, FERRITIN, TIBC, IRON, RETICCTPCT in the last 72 hours. Sepsis Labs: No results for input(s): PROCALCITON, LATICACIDVEN in the last 168 hours.  Recent Results (from the past 240 hour(s))  Culture, blood (routine x 2)     Status: None   Collection Time: 09/25/18  9:11 PM   Specimen: BLOOD  Result Value Ref Range Status   Specimen Description BLOOD LEFT HAND  Final   Special Requests   Final    BOTTLES DRAWN AEROBIC ONLY Blood Culture adequate volume   Culture   Final    NO GROWTH 5 DAYS Performed at [facility], [address],  [place], [place] [address]    Report Status 09/30/2018 FINAL  Final  Culture, blood (routine x 2)     Status: None   Collection Time: 09/25/18  9:16 PM   Specimen: BLOOD  Result Value Ref Range Status   Specimen Description BLOOD LEFT HAND  Final   Special Requests   Final    BOTTLES DRAWN AEROBIC ONLY Blood Culture adequate volume   Culture   Final    NO GROWTH 5 DAYS Performed at [facility], [place] [address], [place], [place] [address]    Report Status 09/30/2018 FINAL  Final  Culture, respiratory (non-expectorated)     Status: None   Collection Time: 09/26/18  9:01 AM   Specimen: Tracheal Aspirate; Respiratory  Result Value Ref Range Status   Specimen Description TRACHEAL ASPIRATE  Final   Special Requests NONE  Final   Gram Stain   Final    FEW WBC PRESENT, PREDOMINANTLY PMN RARE GRAM POSITIVE COCCI RARE GRAM NEGATIVE RODS Performed at [facility], [place] [address], [place], [place] [address]    Culture   Final    FEW ACINETOBACTER SPECIES FEW CITROBACTER FREUNDII    Report Status 09/29/2018 FINAL  Final   Organism ID, Bacteria ACINETOBACTER SPECIES  Final   Organism ID, Bacteria CITROBACTER FREUNDII  Final      Susceptibility   Acinetobacter species - MIC*    CEFTAZIDIME 4 SENSITIVE Sensitive     CEFTRIAXONE 16 INTERMEDIATE Intermediate     CIPROFLOXACIN <=0.25 SENSITIVE Sensitive     GENTAMICIN <=1 SENSITIVE Sensitive     IMIPENEM <=0.25 SENSITIVE Sensitive     PIP/TAZO <=4 SENSITIVE Sensitive     TRIMETH/SULFA <=20 SENSITIVE Sensitive     CEFEPIME 2 SENSITIVE Sensitive     AMPICILLIN/SULBACTAM <=2 SENSITIVE Sensitive     * FEW ACINETOBACTER SPECIES   Citrobacter freundii - MIC*    CEFAZOLIN >=64 RESISTANT Resistant     CEFEPIME <=  1 SENSITIVE Sensitive     CEFTAZIDIME >=64 RESISTANT Resistant     CEFTRIAXONE >=64 RESISTANT Resistant     CIPROFLOXACIN <=0.25 SENSITIVE Sensitive     GENTAMICIN <=1 SENSITIVE Sensitive     IMIPENEM <=0.25 SENSITIVE  Sensitive     TRIMETH/SULFA <=20 SENSITIVE Sensitive     PIP/TAZO >=128 RESISTANT Resistant     * FEW CITROBACTER FREUNDII  Urine Culture     Status: Abnormal   Collection Time: 09/26/18 12:30 PM   Specimen: Urine, Random  Result Value Ref Range Status   Specimen Description URINE, RANDOM  Final   Special Requests   Final    NONE Performed at [facility], [place] [address], [place], [place] [address]    Culture (A)  Final    40,000 COLONIES/mL MULTIPLE SPECIES PRESENT, SUGGEST RECOLLECTION   Report Status 09/27/2018 FINAL  Final  Urine Culture     Status: Abnormal   Collection Time: 09/27/18  1:59 PM   Specimen: Urine, Random  Result Value Ref Range Status   Specimen Description URINE, RANDOM  Final   Special Requests   Final    NONE Performed at [facility], [place] [address], [place], [place] [address]    Culture (A)  Final    >=100,000 COLONIES/mL ENTEROBACTER ASBURIAE >=100,000 COLONIES/mL CITROBACTER FREUNDII    Report Status 09/30/2018 FINAL  Final   Organism ID, Bacteria ENTEROBACTER ASBURIAE (A)  Final   Organism ID, Bacteria CITROBACTER FREUNDII (A)  Final      Susceptibility   Citrobacter freundii - MIC*    CEFAZOLIN >=64 RESISTANT Resistant     CEFTRIAXONE >=64 RESISTANT Resistant     CIPROFLOXACIN <=0.25 SENSITIVE Sensitive     GENTAMICIN <=1 SENSITIVE Sensitive     IMIPENEM 0.5 SENSITIVE Sensitive     NITROFURANTOIN <=16 SENSITIVE Sensitive     TRIMETH/SULFA <=20 SENSITIVE Sensitive     PIP/TAZO >=128 RESISTANT Resistant     * >=100,000 COLONIES/mL CITROBACTER FREUNDII   Enterobacter asburiae - MIC*    CEFAZOLIN >=64 RESISTANT Resistant     CEFTRIAXONE >=64 RESISTANT Resistant     CIPROFLOXACIN <=0.25 SENSITIVE Sensitive     GENTAMICIN <=1 SENSITIVE Sensitive     IMIPENEM 0.5 SENSITIVE Sensitive     NITROFURANTOIN 64 INTERMEDIATE Intermediate     TRIMETH/SULFA 40 SENSITIVE Sensitive     PIP/TAZO >=128 RESISTANT Resistant     * >=100,000  COLONIES/mL ENTEROBACTER ASBURIAE      Radiology Studies: No results found.    LOS: 25 days   Time spent: More than 50% of that time was spent in counseling and/or coordination of care.  Antonieta Pert, MD [facility]  10/03/2018, 8:35 AM

## 2018-10-04 NOTE — Progress Notes (Signed)
Patient on trach collar 5 L/min, FiO2 28%. Oxygen sat 100%, pt comfortable and resting. Will continue to monitor.

## 2018-10-04 NOTE — Progress Notes (Signed)
Assisted tele visit to patient with son.  Micheal Grafton Samson, RN  

## 2018-10-04 NOTE — Progress Notes (Signed)
CSW attempted to reach Marcelene Butte [phone]) at [place] [facility] to obtain an update for this patient's long term Medicaid application, no answer so voicemail was left requesting a return call. CSW also emailed Butch Penny for an update.  Madilyn Fireman, MSW, LCSW-A Clinical Social Worker [facility] [phone]

## 2018-10-04 NOTE — Progress Notes (Signed)
PROGRESS NOTE    Micheal Carey  MMN:[id number] DOB: [birth year] DOA: 09/08/2018 PCP: [name], MD   Brief Narrative: As per HPI: 68 year old male found in parking lot with PEA arrest, unknown downtime require mechanical ventilation, UDS was positive for opioids and alcohol level was 276.  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 Patient could not be extubated and subsequently was trached on 8/17. -Status post PEG tube on 8/19.. -PCCM following and managing vent, sedation, weaning trials. -Transfer to [facility] service on 8/21. [facility] course complicated by agitation and intermittent vomiting.  Recurrent pneumonia and now UTI. The Social worker consulted and following for disposition LTAC versus SNF 9/2- remains on vent support, encephalopathic 9/3- started on trach collar  Subjective: No acute events overnight Patient is on trach collar tolerating at 4 L/min FiO2 20% surgery depression. Tries to track his eyes on board stimuli.  Otherwise no response, intermittently moving extremities non-purposefully.  Assessment & Plan:   Acute respiratory failure with hypoxia:Likely related to accidental opioid/alcohol overdose, in the background of COPD.  Continue on trach (8/17), with vent support-continue vent with weaning trial per PCCM,who are following twice a week-patient started on on trach collar wean 9/3 and tolerating .Sedation per critical care, currently on oxycodone Lyrica Seroquel Klonopin.  PEA arrest in parking lot with Severe anoxic encephalopathy: PEA arrest likely from overdose.  Status post trach, remains encephalopathic. Currently supportive care, looking into SNF versus LTAC placement with vent support. Evaluated by neurology, earlier this admission EEG and MRI findings suggestive of severe anoxic brain injury.This was discussed with family, prognosis felt to be very  poor.Repeat EEG 8/20 suggestive of severe encephalopathy as well.Family wants full aggressive scope of treatment. he has been restless,and increased  oxycodone back to 10 mg.Nonverbal not following commands.  Pneumonia/Fever:Completed antibiotics. Zosyn from 8/12 through 8/13 and then Rocephin from 8/15 to 8/17. blood culture no growth to date. Had fever. sputum Growing acinetobacter and Citrobacter freundii both sensitive to cefepime-completed 8/28---8/31  And changed to meropenem 8/31 to cover pathogens from urine-continue 7 more days from 8/31.overall stable, afebrile, last wbc at 8k.  Dysphagia/intermittent episodes of vomiting: Status post PEG tube 8/19. TF had to be held during the course-restarted tube feedings on 8/23, and tolerating at 45 cc/h-  KUB with no ileus, having bowel movement, off Reglan.  Diarrhea resolved.Tube feed at at 60 mL/h and tolerating continue the same.  Appreciate nutrition input.    UTI with Enterobacter species and Citrobacter.  Continue meropenem for 7 more days till 9/6 to cover UTI and PNA.    Hypernatremia/hypokalemia:.  Sodium/potassium normal. Cont TF/free water.  XUX:YBFXO pressure has been soft, stopped Norvasc and BP now stable.Continue carvedilol.    Alcohol abuse: out of window for withdrawal. Continue thiamine  COPD, continue vent as above, continue nebs  Goals of care: Per CCM felt to have very poor prognosis with severe anoxic encephalopathy, with no downtime following cardiac arrest, based on EEG and MRI and neurology recommendation. Per discussion have been held with family, they want to proceed with full scope of treatment will need vent in SNF.  CM working on placement waiting for Medicaid number  DVT prophylaxis: Lovenox Code Status: Full code Family Communication: Son was updated over phone 8/31.  Family has been updated by staff/RN Disposition Plan: We will need SNF w vent or LTACH. Case manager working on it. D/c when available.   Consultants:  CCM following  Procedures:   None  Nutrition: Nutrition Problem: Moderate Malnutrition Etiology: social / environmental circumstances(Hx substance abuse) Signs/Symptoms: moderate muscle depletion, moderate fat depletion Interventions: Tube feeding  Pressure Ulcer: Deep tissue injury as below Pressure Injury 09/24/18 Heel Left Deep Tissue Injury - Purple or maroon localized area of discolored intact skin or blood-filled blister due to damage of underlying soft tissue from pressure and/or shear. (Active)  09/24/18 1115  Location: Heel  Location Orientation: Left  Staging: Deep Tissue Injury - Purple or maroon localized area of discolored intact skin or blood-filled blister due to damage of underlying soft tissue from pressure and/or shear.  Wound Description (Comments):   Present on Admission: No     Pressure Injury 09/24/18 Right Deep Tissue Injury - Purple or maroon localized area of discolored intact skin or blood-filled blister due to damage of underlying soft tissue from pressure and/or shear. (Active)  09/24/18 1120  Location:   Location Orientation: Right  Staging: Deep Tissue Injury - Purple or maroon localized area of discolored intact skin or blood-filled blister due to damage of underlying soft tissue from pressure and/or shear.  Wound Description (Comments):   Present on Admission: No    Body mass index is 20.45 kg/m.    Antimicrobials: Anti-infectives (From admission, onward)   Start     Dose/Rate Route Frequency Ordered Stop   09/30/18 1400  meropenem (MERREM) 1 g in sodium chloride 0.9 % 100 mL IVPB     1 g 200 mL/hr over 30 Minutes Intravenous Every 8 hours 09/30/18 1119 10/07/18 1359   09/27/18 1200  ceFEPIme (MAXIPIME) 2 g in sodium chloride 0.9 % 100 mL IVPB  Status:  Discontinued     2 g 200 mL/hr over 30 Minutes Intravenous Every 8 hours 09/27/18 1133 09/30/18 1119   09/18/18 1330  ceFAZolin (ANCEF) IVPB 2g/100 mL premix     2 g 200  mL/hr over 30 Minutes Intravenous To Radiology 09/18/18 1323 09/18/18 1356   09/17/18 0900  ceFAZolin (ANCEF) IVPB 2g/100 mL premix     2 g 200 mL/hr over 30 Minutes Intravenous To Radiology 09/17/18 0849 09/18/18 0900   09/14/18 1000  cefTRIAXone (ROCEPHIN) 2 g in sodium chloride 0.9 % 100 mL IVPB  Status:  Discontinued     2 g 200 mL/hr over 30 Minutes Intravenous Every 24 hours 09/14/18 0919 09/16/18 1040   09/12/18 0000  vancomycin (VANCOCIN) 1,250 mg in sodium chloride 0.9 % 250 mL IVPB  Status:  Discontinued     1,250 mg 166.7 mL/hr over 90 Minutes Intravenous Every 24 hours 09/11/18 0044 09/11/18 1229   09/12/18 0000  vancomycin (VANCOCIN) IVPB 1000 mg/200 mL premix  Status:  Discontinued     1,000 mg 200 mL/hr over 60 Minutes Intravenous Every 24 hours 09/11/18 1229 09/13/18 1117   09/11/18 0800  piperacillin-tazobactam (ZOSYN) IVPB 3.375 g  Status:  Discontinued     3.375 g 12.5 mL/hr over 240 Minutes Intravenous Every 8 hours 09/11/18 0044 09/13/18 1117   09/11/18 0045  piperacillin-tazobactam (ZOSYN) IVPB 3.375 g     3.375 g 100 mL/hr over 30 Minutes Intravenous  Once 09/11/18 0040 09/11/18 0232   09/11/18 0045  vancomycin (VANCOCIN) 1,500 mg in sodium chloride 0.9 % 500 mL IVPB     1,500 mg 250 mL/hr over 120 Minutes Intravenous  Once 09/11/18 0040 09/11/18 0447       Objective: Vitals:   10/04/18 0700 10/04/18 0736 10/04/18 0800 10/04/18 0900  BP:  125/67   136/67  Pulse: 95 95 97 94  Resp: 14 (!) _0 Temp: 99.7 F (37.6 C)     TempSrc: Axillary     SpO2: 100% 99% 100% 100%  Weight:      Height:        Intake/Output Summary (Last 24 hours) at 10/04/2018 1053 Last data filed at 10/04/2018 1000 Gross per 24 hour  Intake 1857.54 ml  Output 1500 ml  Net 357.54 ml   Filed Weights   10/02/18 0333 10/03/18 0443 10/04/18 0349  Weight: 60.9 kg 61.9 kg 61 kg   Weight change: -0.9 kg  Body mass index is 20.45 kg/m.  Intake/Output from previous day: 09/03  0701 - 09/04 0700 In: 1826.2 [I.V.:31.2; HW/EX:9371; IV Piggyback:300] Out: 6967 [Urine:1585] Intake/Output this shift: Total I/O In: 316.4 [I.V.:1.4; NG/GT:315] Out: 325 [Urine:325]  Examination:  General exam: Tries to track for verbal stimuli, otherwise unresponsive, appears cachectic thin older than stated age  HEENT:PERRL,Oral mucosa moist, Ear/Nose normal on gross exam.  Trach collar present Respiratory system: Bilateral diminished breath sounds at base, clear, no wheezing or crackles  Cardiovascular system: S1 & S2 heard,No JVD, murmurs. Gastrointestinal system: Abdomen is  soft, PEG+, non distended, BS +  Nervous System: Does not follow commands,limited exam, unresponsive, tracks some. Extremities: No edema, no clubbing, distal peripheral pulses palpable. Skin: No rashes, lesions, no icterus MSK: thin,weak  Medications:  Scheduled Meds: . carvedilol  6.25 mg Per Tube BID WC  . chlorhexidine gluconate (MEDLINE KIT)  15 mL Mouth Rinse BID  . Chlorhexidine Gluconate Cloth  6 each Topical Q0600  . clonazePAM  1 mg Per Tube TID  . enoxaparin (LOVENOX) injection  40 mg Subcutaneous Q24H  . feeding supplement (JEVITY 1.2 CAL)  1,000 mL Per Tube Q24H  . feeding supplement (PRO-STAT SUGAR FREE 64)  30 mL Per Tube BID  . folic acid  1 mg Per Tube Daily  . free water  150 mL Per Tube Q6H  . mouth rinse  15 mL Mouth Rinse 10 times per day  . multivitamin  15 mL Per Tube Daily  . oxyCODONE  10 mg Per Tube Q6H  . pantoprazole sodium  40 mg Per Tube Q24H  . pregabalin  25 mg Per Tube BID  . QUEtiapine  25 mg Per Tube BID  . thiamine  100 mg Per Tube Daily   Continuous Infusions: . sodium chloride Stopped (10/04/18 0708)  . meropenem (MERREM) IV Stopped (10/04/18 0636)    Data Reviewed: I have personally reviewed following labs and imaging studies  CBC: Recent Labs  Lab 09/28/18 0820 09/30/18 0841 10/01/18 0254 10/02/18 0638  WBC 9.0 10.2 10.7* 8.2  HGB 9.6* 10.1* 9.7*  8.9*  HCT 29.2* 31.2* 30.4* 28.0*  MCV 85.4 85.0 86.1 86.7  PLT 410* 433* 442* 893   Basic Metabolic Panel: Recent Labs  Lab 09/28/18 0820 09/29/18 0428 09/30/18 0841 10/01/18 0254 10/02/18 0638  NA 138 139 137 136 138  K 4.2 4.1 4.3 3.9 4.1  CL 106 108 103 102 104  CO2 _1 GLUCOSE 125* 114* 116* 120* 96  BUN _2 CREATININE 0.93 0.77 0.88 0.89 1.00  CALCIUM 8.7* 8.9 9.0 9.0 8.7*   GFR: Estimated Creatinine Clearance: 62.7 mL/min (by C-G formula based on SCr of 1 mg/dL). Liver Function Tests: Recent Labs  Lab 10/02/18 0638  AST 35  ALT 19  ALKPHOS 118  BILITOT 0.4  PROT 6.1*  ALBUMIN 2.3*   No results for input(s): LIPASE, AMYLASE in the last 168 hours. No results for input(s): AMMONIA in the last 168 hours. Coagulation Profile: No results for input(s): INR, PROTIME in the last 168 hours. Cardiac Enzymes: No results for input(s): CKTOTAL, CKMB, CKMBINDEX, TROPONINI in the last 168 hours. BNP (last 3 results) No results for input(s): PROBNP in the last 8760 hours. HbA1C: No results for input(s): HGBA1C in the last 72 hours. CBG: Recent Labs  Lab 10/03/18 0721 10/03/18 1130 10/03/18 1520 10/03/18 1924 10/03/18 2218  GLUCAP 105* 113* 101* 105* 117*   Lipid Profile: No results for input(s): CHOL, HDL, LDLCALC, TRIG, CHOLHDL, LDLDIRECT in the last 72 hours. Thyroid Function Tests: No results for input(s): TSH, T4TOTAL, FREET4, T3FREE, THYROIDAB in the last 72 hours. Anemia Panel: No results for input(s): VITAMINB12, FOLATE, FERRITIN, TIBC, IRON, RETICCTPCT in the last 72 hours. Sepsis Labs: No results for input(s): PROCALCITON, LATICACIDVEN in the last 168 hours.  Recent Results (from the past 240 hour(s))  Culture, blood (routine x 2)     Status: None   Collection Time: 09/25/18  9:11 PM   Specimen: BLOOD  Result Value Ref Range Status   Specimen Description BLOOD LEFT HAND  Final   Special Requests   Final    BOTTLES DRAWN  AEROBIC ONLY Blood Culture adequate volume   Culture   Final    NO GROWTH 5 DAYS Performed at [facility], [address] [place], [place] [address]    Report Status 09/30/2018 FINAL  Final  Culture, blood (routine x 2)     Status: None   Collection Time: 09/25/18  9:16 PM   Specimen: BLOOD  Result Value Ref Range Status   Specimen Description BLOOD LEFT HAND  Final   Special Requests   Final    BOTTLES DRAWN AEROBIC ONLY Blood Culture adequate volume   Culture   Final    NO GROWTH 5 DAYS Performed at [facility], [place] [address], [place], [place] [address]    Report Status 09/30/2018 FINAL  Final  Culture, respiratory (non-expectorated)     Status: None   Collection Time: 09/26/18  9:01 AM   Specimen: Tracheal Aspirate; Respiratory  Result Value Ref Range Status   Specimen Description TRACHEAL ASPIRATE  Final   Special Requests NONE  Final   Gram Stain   Final    FEW WBC PRESENT, PREDOMINANTLY PMN RARE GRAM POSITIVE COCCI RARE GRAM NEGATIVE RODS Performed at [facility], [place] [address], [place], [place] [address]    Culture   Final    FEW ACINETOBACTER SPECIES FEW CITROBACTER FREUNDII    Report Status 09/29/2018 FINAL  Final   Organism ID, Bacteria ACINETOBACTER SPECIES  Final   Organism ID, Bacteria CITROBACTER FREUNDII  Final      Susceptibility   Acinetobacter species - MIC*    CEFTAZIDIME 4 SENSITIVE Sensitive     CEFTRIAXONE 16 INTERMEDIATE Intermediate     CIPROFLOXACIN <=0.25 SENSITIVE Sensitive     GENTAMICIN <=1 SENSITIVE Sensitive     IMIPENEM <=0.25 SENSITIVE Sensitive     PIP/TAZO <=4 SENSITIVE Sensitive     TRIMETH/SULFA <=20 SENSITIVE Sensitive     CEFEPIME 2 SENSITIVE Sensitive     AMPICILLIN/SULBACTAM <=2 SENSITIVE Sensitive     * FEW ACINETOBACTER SPECIES   Citrobacter freundii - MIC*    CEFAZOLIN >=64 RESISTANT Resistant     CEFEPIME <=1 SENSITIVE  Sensitive     CEFTAZIDIME >=64 RESISTANT Resistant     CEFTRIAXONE >=64  RESISTANT Resistant     CIPROFLOXACIN <=0.25 SENSITIVE Sensitive     GENTAMICIN <=1 SENSITIVE Sensitive     IMIPENEM <=0.25 SENSITIVE Sensitive     TRIMETH/SULFA <=20 SENSITIVE Sensitive     PIP/TAZO >=128 RESISTANT Resistant     * FEW CITROBACTER FREUNDII  Urine Culture     Status: Abnormal   Collection Time: 09/26/18 12:30 PM   Specimen: Urine, Random  Result Value Ref Range Status   Specimen Description URINE, RANDOM  Final   Special Requests   Final    NONE Performed at [facility], [place] [address] [place], [place] [address]    Culture (A)  Final    40,000 COLONIES/mL MULTIPLE SPECIES PRESENT, SUGGEST RECOLLECTION   Report Status 09/27/2018 FINAL  Final  Urine Culture     Status: Abnormal   Collection Time: 09/27/18  1:59 PM   Specimen: Urine, Random  Result Value Ref Range Status   Specimen Description URINE, RANDOM  Final   Special Requests   Final    NONE Performed at [facility], [place] [address] [place], [place] [address]    Culture (A)  Final    >=100,000 COLONIES/mL ENTEROBACTER ASBURIAE >=100,000 COLONIES/mL CITROBACTER FREUNDII    Report Status 09/30/2018 FINAL  Final   Organism ID, Bacteria ENTEROBACTER ASBURIAE (A)  Final   Organism ID, Bacteria CITROBACTER FREUNDII (A)  Final      Susceptibility   Citrobacter freundii - MIC*    CEFAZOLIN >=64 RESISTANT Resistant     CEFTRIAXONE >=64 RESISTANT Resistant     CIPROFLOXACIN <=0.25 SENSITIVE Sensitive     GENTAMICIN <=1 SENSITIVE Sensitive     IMIPENEM 0.5 SENSITIVE Sensitive     NITROFURANTOIN <=16 SENSITIVE Sensitive     TRIMETH/SULFA <=20 SENSITIVE Sensitive     PIP/TAZO >=128 RESISTANT Resistant     * >=100,000 COLONIES/mL CITROBACTER FREUNDII   Enterobacter asburiae - MIC*    CEFAZOLIN >=64 RESISTANT Resistant     CEFTRIAXONE >=64 RESISTANT Resistant     CIPROFLOXACIN <=0.25 SENSITIVE Sensitive     GENTAMICIN <=1 SENSITIVE Sensitive     IMIPENEM 0.5 SENSITIVE Sensitive      NITROFURANTOIN 64 INTERMEDIATE Intermediate     TRIMETH/SULFA 40 SENSITIVE Sensitive     PIP/TAZO >=128 RESISTANT Resistant     * >=100,000 COLONIES/mL ENTEROBACTER ASBURIAE      Radiology Studies: No results found.    LOS: 26 days   Time spent: More than 50% of that time was spent in counseling and/or coordination of care.  Antonieta Pert, MD [facility]  10/04/2018, 10:53 AM

## 2018-10-05 DIAGNOSIS — L89626 Pressure-induced deep tissue damage of left heel: Secondary | ICD-10-CM

## 2018-10-05 LAB — GLUCOSE, CAPILLARY
Glucose-Capillary: 109 mg/dL — ABNORMAL HIGH (ref 70–99)
Glucose-Capillary: 125 mg/dL — ABNORMAL HIGH (ref 70–99)
Glucose-Capillary: 129 mg/dL — ABNORMAL HIGH (ref 70–99)
Glucose-Capillary: 143 mg/dL — ABNORMAL HIGH (ref 70–99)
Glucose-Capillary: 144 mg/dL — ABNORMAL HIGH (ref 70–99)

## 2018-10-05 NOTE — Progress Notes (Addendum)
PROGRESS NOTE    Micheal Carey  WCB:[id number]  DOB: [birth year]  DOA: 09/08/2018 PCP: [name], MD  Brief Narrative:  68 year old male found in parking lot with PEA arrest, unknown downtime require mechanical ventilation, UDS was positive for opioids and alcohol level was 276. 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 Patient could not be extubated and subsequently was trached on 8/17. -Status post PEG tube on 8/19.. -PCCM following and managing vent, sedation, weaning trials. -Transfer to [facility] service on 8/21. [facility] course complicated by agitation and intermittent vomiting.Recurrent pneumonia and now UTI. The Social worker consulted and following for disposition LTAC versus SNF 9/2- remains on vent support, encephalopathic 9/3- started on trach collar  Subjective:  Continues to remain encephalopathic.  Nurse reports copious secretions requiring deep suctioning every hour.  Objective: Vitals:   10/05/18 1558 10/05/18 1600 10/05/18 1700 10/05/18 1800  BP: 134/85 140/83 134/85 (!) 141/66  Pulse: (!) 110 (!) 111 (!) 103 (!) 102  Resp:  (!) 26 (!) 22 (!) 26  Temp:      TempSrc:      SpO2:  100% 99% 99%  Weight:      Height:        Intake/Output Summary (Last 24 hours) at 10/05/2018 1931 Last data filed at 10/05/2018 1800 Gross per 24 hour  Intake 1733.55 ml  Output 1840 ml  Net -106.45 ml   Filed Weights   10/03/18 0443 10/04/18 0349 10/05/18 0500  Weight: 61.9 kg 61 kg 60.4 kg    Physical Examination:  General exam: Appears in mild distress due to copious secretions. Respiratory system: Upper airway sounds otherwise clear to auscultation. Respiratory effort intermittently increased, improves with suctioning Cardiovascular system: S1 & S2 heard, RRR. Marland Kitchen No pedal edema. Gastrointestinal system: Status post PEG tube, condom catheter.  Abdomen is nondistended,  soft and nontender.Normal bowel sounds heard. Central nervous system: Awake and tracks at times but encephalopathy, cannot follow commands.  Noted to be moving upper extremities well. Extremities: Symmetric 5 x 5 power. Skin: Left heel decubitus and right upper back blood blister. Psychiatry: Encephalopathic, cannot assess further    Data Reviewed: I have personally reviewed following labs and imaging studies  CBC: Recent Labs  Lab 09/30/18 0841 10/01/18 0254 10/02/18 0638  WBC 10.2 10.7* 8.2  HGB 10.1* 9.7* 8.9*  HCT 31.2* 30.4* 28.0*  MCV 85.0 86.1 86.7  PLT 433* 442* 616   Basic Metabolic Panel: Recent Labs  Lab 09/29/18 0428 09/30/18 0841 10/01/18 0254 10/02/18 0638  NA 139 137 136 138  K 4.1 4.3 3.9 4.1  CL 108 103 102 104  CO2 '23 25 24 24  ' GLUCOSE 114* 116* 120* 96  BUN '20 17 20 22  ' CREATININE 0.77 0.88 0.89 1.00  CALCIUM 8.9 9.0 9.0 8.7*   GFR: Estimated Creatinine Clearance: 62.1 mL/min (by C-G formula based on SCr of 1 mg/dL). Liver Function Tests: Recent Labs  Lab 10/02/18 0638  AST 35  ALT 19  ALKPHOS 118  BILITOT 0.4  PROT 6.1*  ALBUMIN 2.3*   No results for input(s): LIPASE, AMYLASE in the last 168 hours. No results for input(s): AMMONIA in the last 168 hours. Coagulation Profile: No results for input(s): INR, PROTIME in the last 168 hours. Cardiac Enzymes: No results for input(s): CKTOTAL, CKMB, CKMBINDEX, TROPONINI in the last 168 hours. BNP (last 3 results) No results for input(s): PROBNP in the last 8760  hours. HbA1C: No results for input(s): HGBA1C in the last 72 hours. CBG: Recent Labs  Lab 10/03/18 2218 10/05/18 0004 10/05/18 0751 10/05/18 1144 10/05/18 1558  GLUCAP 117* 109* 125* 144* 129*   Lipid Profile: No results for input(s): CHOL, HDL, LDLCALC, TRIG, CHOLHDL, LDLDIRECT in the last 72 hours. Thyroid Function Tests: No results for input(s): TSH, T4TOTAL, FREET4, T3FREE, THYROIDAB in the last 72 hours. Anemia Panel:  No results for input(s): VITAMINB12, FOLATE, FERRITIN, TIBC, IRON, RETICCTPCT in the last 72 hours. Sepsis Labs: No results for input(s): PROCALCITON, LATICACIDVEN in the last 168 hours.  Recent Results (from the past 240 hour(s))  Culture, blood (routine x 2)     Status: None   Collection Time: 09/25/18  9:11 PM   Specimen: BLOOD  Result Value Ref Range Status   Specimen Description BLOOD LEFT HAND  Final   Special Requests   Final    BOTTLES DRAWN AEROBIC ONLY Blood Culture adequate volume   Culture   Final    NO GROWTH 5 DAYS Performed at [facility], [address], [place], [place] [address]    Report Status 09/30/2018 FINAL  Final  Culture, blood (routine x 2)     Status: None   Collection Time: 09/25/18  9:16 PM   Specimen: BLOOD  Result Value Ref Range Status   Specimen Description BLOOD LEFT HAND  Final   Special Requests   Final    BOTTLES DRAWN AEROBIC ONLY Blood Culture adequate volume   Culture   Final    NO GROWTH 5 DAYS Performed at [facility], [place] [address], [place], [place] [address]    Report Status 09/30/2018 FINAL  Final  Culture, respiratory (non-expectorated)     Status: None   Collection Time: 09/26/18  9:01 AM   Specimen: Tracheal Aspirate; Respiratory  Result Value Ref Range Status   Specimen Description TRACHEAL ASPIRATE  Final   Special Requests NONE  Final   Gram Stain   Final    FEW WBC PRESENT, PREDOMINANTLY PMN RARE GRAM POSITIVE COCCI RARE GRAM NEGATIVE RODS Performed at [facility], [place] [address], [place], [place] [address]    Culture   Final    FEW ACINETOBACTER SPECIES FEW CITROBACTER FREUNDII    Report Status 09/29/2018 FINAL  Final   Organism ID, Bacteria ACINETOBACTER SPECIES  Final   Organism ID, Bacteria CITROBACTER FREUNDII  Final      Susceptibility   Acinetobacter species - MIC*    CEFTAZIDIME 4 SENSITIVE Sensitive     CEFTRIAXONE 16 INTERMEDIATE Intermediate     CIPROFLOXACIN <=0.25 SENSITIVE  Sensitive     GENTAMICIN <=1 SENSITIVE Sensitive     IMIPENEM <=0.25 SENSITIVE Sensitive     PIP/TAZO <=4 SENSITIVE Sensitive     TRIMETH/SULFA <=20 SENSITIVE Sensitive     CEFEPIME 2 SENSITIVE Sensitive     AMPICILLIN/SULBACTAM <=2 SENSITIVE Sensitive     * FEW ACINETOBACTER SPECIES   Citrobacter freundii - MIC*    CEFAZOLIN >=64 RESISTANT Resistant     CEFEPIME <=1 SENSITIVE Sensitive     CEFTAZIDIME >=64 RESISTANT Resistant     CEFTRIAXONE >=64 RESISTANT Resistant     CIPROFLOXACIN <=0.25 SENSITIVE Sensitive     GENTAMICIN <=1 SENSITIVE Sensitive     IMIPENEM <=0.25 SENSITIVE Sensitive     TRIMETH/SULFA <=20 SENSITIVE Sensitive     PIP/TAZO >=128 RESISTANT Resistant     * FEW CITROBACTER FREUNDII  Urine Culture  Status: Abnormal   Collection Time: 09/26/18 12:30 PM   Specimen: Urine, Random  Result Value Ref Range Status   Specimen Description URINE, RANDOM  Final   Special Requests   Final    NONE Performed at [facility], [address], [place], [place] [address]    Culture (A)  Final    40,000 COLONIES/mL MULTIPLE SPECIES PRESENT, SUGGEST RECOLLECTION   Report Status 09/27/2018 FINAL  Final  Urine Culture     Status: Abnormal   Collection Time: 09/27/18  1:59 PM   Specimen: Urine, Random  Result Value Ref Range Status   Specimen Description URINE, RANDOM  Final   Special Requests   Final    NONE Performed at [facility], [place] [address], [place], [place] [address]    Culture (A)  Final    >=100,000 COLONIES/mL ENTEROBACTER ASBURIAE >=100,000 COLONIES/mL CITROBACTER FREUNDII    Report Status 09/30/2018 FINAL  Final   Organism ID, Bacteria ENTEROBACTER ASBURIAE (A)  Final   Organism ID, Bacteria CITROBACTER FREUNDII (A)  Final      Susceptibility   Citrobacter freundii - MIC*    CEFAZOLIN >=64 RESISTANT Resistant     CEFTRIAXONE >=64 RESISTANT Resistant     CIPROFLOXACIN <=0.25 SENSITIVE Sensitive     GENTAMICIN <=1 SENSITIVE Sensitive      IMIPENEM 0.5 SENSITIVE Sensitive     NITROFURANTOIN <=16 SENSITIVE Sensitive     TRIMETH/SULFA <=20 SENSITIVE Sensitive     PIP/TAZO >=128 RESISTANT Resistant     * >=100,000 COLONIES/mL CITROBACTER FREUNDII   Enterobacter asburiae - MIC*    CEFAZOLIN >=64 RESISTANT Resistant     CEFTRIAXONE >=64 RESISTANT Resistant     CIPROFLOXACIN <=0.25 SENSITIVE Sensitive     GENTAMICIN <=1 SENSITIVE Sensitive     IMIPENEM 0.5 SENSITIVE Sensitive     NITROFURANTOIN 64 INTERMEDIATE Intermediate     TRIMETH/SULFA 40 SENSITIVE Sensitive     PIP/TAZO >=128 RESISTANT Resistant     * >=100,000 COLONIES/mL ENTEROBACTER ASBURIAE      Radiology Studies: No results found.      Scheduled Meds: . carvedilol  6.25 mg Per Tube BID WC  . chlorhexidine gluconate (MEDLINE KIT)  15 mL Mouth Rinse BID  . Chlorhexidine Gluconate Cloth  6 each Topical Q0600  . clonazePAM  1 mg Per Tube TID  . enoxaparin (LOVENOX) injection  40 mg Subcutaneous Q24H  . feeding supplement (JEVITY 1.2 CAL)  1,000 mL Per Tube Q24H  . feeding supplement (PRO-STAT SUGAR FREE 64)  30 mL Per Tube BID  . folic acid  1 mg Per Tube Daily  . free water  150 mL Per Tube Q6H  . mouth rinse  15 mL Mouth Rinse 10 times per day  . multivitamin  15 mL Per Tube Daily  . oxyCODONE  10 mg Per Tube Q6H  . pantoprazole sodium  40 mg Per Tube Q24H  . pregabalin  25 mg Per Tube BID  . QUEtiapine  25 mg Per Tube BID  . thiamine  100 mg Per Tube Daily   Continuous Infusions: . sodium chloride 10 mL/hr at 10/05/18 1800  . meropenem (MERREM) IV Stopped (10/05/18 1423)    Assessment & Plan:   Acute respiratory failure with hypoxia:Likely related to accidental opioid/alcohol overdose, in the background of COPD.  Continue on trach (8/17), with vent support-continue vent with weaning trial per PCCM,who are following twice a week-patient started on on trach collar wean 9/3 and tolerating .  Sedation per critical care, currently on oxycodone Lyrica  Seroquel Klonopin.  Not sure if patient would benefit from scopolamine patch for copious secretions, RN will discuss with pulmonary.  Continue frequent suctioning.  PEA arrest in parking lot with Severe anoxic encephalopathy: PEA arrest likely from overdose.  Status post trach, remains encephalopathic. Currently supportive care, looking into SNF versus LTAC placement with vent support. Evaluated by neurology, earlier this admission EEG and MRI findings suggestive of severe anoxic brain injury.This was discussed with family, prognosis felt to be very poor.Repeat EEG 8/20 suggestive of severe encephalopathy as well.Family wants full aggressive scope of treatment. he has been restless,and increasedoxycodone back to 10 mg.Nonverbal not following commands.  Pneumonia/Fever:Completed antibiotics. Zosyn from 8/12 through 8/13 and then Rocephin from 8/15 to 8/17. blood culture no growth to date. Had fever. sputum Growing acinetobacter and Citrobacter freundii both sensitive to cefepime-completed 8/28---8/31  And changed to meropenem 8/31to cover pathogens from urine-continue 7 more days from 8/31.overall stable, afebrile, last wbc at 8k.  Dysphagia/intermittent episodes of vomiting: Status post PEG tube 8/19. TF had to be held during the course-restarted tube feedings on 8/23, and tolerating at 45 cc/h-  KUB with no ileus, having bowel movement, off Reglan.  Diarrhea resolved.Tube feed at at 60 mL/h and tolerating continue the same.  Appreciate nutrition input.    UTI with Enterobacter species and Citrobacter. Continue meropenem for till 9/6 to cover UTI and PNA.    Hypernatremia/hypokalemia:.  Sodium/potassium normal. Cont TF/free water.  QDU:KRCVK pressure has been soft, stopped Norvasc and BP now stable.Continue carvedilol.    Alcohol abuse: out of window for withdrawal. Continue thiamine  COPD, continue vent as above, continue nebs  Pressure injuries to heal/blood blister on upper back:  Continue local wound care as suggested by wound care RN  Goals of care: Per CCM felt to have very poor prognosis with severe anoxic encephalopathy, with no downtime following cardiac arrest, based on EEG and MRI and neurology recommendation. Per discussion have been held with family, they want to proceed with full scope of treatment will need vent in SNF.  CM working on placement waiting for Medicaid number    DVT prophylaxis: Lovenox Code Status: Remains full code Family / Patient Communication: No family bedside. Disposition Plan: As discussed above under goals of care     LOS: 27 days    Time spent: 25 minutes    Guilford Shi, MD [facility] Pager [phone]  If 7PM-7AM, please contact night-coverage [email or web address] Password [facility] 10/05/2018, 7:31 PM

## 2018-10-06 NOTE — Progress Notes (Signed)
PROGRESS NOTE    Micheal Carey  SKA:[id number]  DOB: [birth year]  DOA: 09/08/2018 PCP: [name], MD  Brief Narrative:  68 year old male found in parking lot with PEA arrest, unknown downtime require mechanical ventilation, UDS was positive for opioids and alcohol level was 276. 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 Patient could not be extubated and subsequently was trached on 8/17. -Status post PEG tube on 8/19.. -PCCM following and managing vent, sedation, weaning trials. -Transfer to [facility] service on 8/21. [facility] course complicated by agitation and intermittent vomiting.Recurrent pneumonia and now UTI. The Social worker consulted and following for disposition LTAC versus SNF 9/2- remains on vent support, encephalopathic 9/3- started on trach collar 9/5-copious tracheal secretions  Subjective:  Continues to remain encephalopathic.  Appears more comfortable today with improved tracheal secretions.  Nurse suctioning every hour  Objective: Vitals:   10/06/18 1400 10/06/18 1500 10/06/18 1526 10/06/18 1600  BP: 127/74 126/75  105/70  Pulse: 92 95  87  Resp: (!) _0 Temp:   99.2 F (37.3 C)   TempSrc:   Axillary   SpO2: 100% 100%  97%  Weight:      Height:        Intake/Output Summary (Last 24 hours) at 10/06/2018 1808 Last data filed at 10/06/2018 1500 Gross per 24 hour  Intake 1603.56 ml  Output 1425 ml  Net 178.56 ml   Filed Weights   10/03/18 0443 10/04/18 0349 10/05/18 0500  Weight: 61.9 kg 61 kg 60.4 kg    Physical Examination:  General exam: Appears in mild distress due to copious secretions. Respiratory system: Improved upper airway sounds, clear to auscultation. Respiratory effort intermittently increased, improves with suctioning Cardiovascular system: S1 & S2 heard, RRR. Marland Kitchen No pedal edema. Gastrointestinal system: Status post PEG tube,  condom catheter.  Abdomen is nondistended, soft and nontender.Normal bowel sounds heard. Central nervous system: Awake and tracks at times but encephalopathy, cannot follow commands.  Noted to be moving upper extremities well. Extremities: Mittens on both hands Skin: Left heel decubitus and right upper back blood blister. Psychiatry: Encephalopathic, cannot assess further    Data Reviewed: I have personally reviewed following labs and imaging studies  CBC: Recent Labs  Lab 09/30/18 0841 10/01/18 0254 10/02/18 0638  WBC 10.2 10.7* 8.2  HGB 10.1* 9.7* 8.9*  HCT 31.2* 30.4* 28.0*  MCV 85.0 86.1 86.7  PLT 433* 442* 203   Basic Metabolic Panel: Recent Labs  Lab 09/30/18 0841 10/01/18 0254 10/02/18 0638  NA 137 136 138  K 4.3 3.9 4.1  CL 103 102 104  CO2 _1 GLUCOSE 116* 120* 96  BUN _2 CREATININE 0.88 0.89 1.00  CALCIUM 9.0 9.0 8.7*   GFR: Estimated Creatinine Clearance: 62.1 mL/min (by C-G formula based on SCr of 1 mg/dL). Liver Function Tests: Recent Labs  Lab 10/02/18 0638  AST 35  ALT 19  ALKPHOS 118  BILITOT 0.4  PROT 6.1*  ALBUMIN 2.3*   No results for input(s): LIPASE, AMYLASE in the last 168 hours. No results for input(s): AMMONIA in the last 168 hours. Coagulation Profile: No results for input(s): INR, PROTIME in the last 168 hours. Cardiac Enzymes: No results for input(s): CKTOTAL, CKMB, CKMBINDEX, TROPONINI in the last 168 hours. BNP (last 3 results) No results for input(s): PROBNP in the last 8760 hours. HbA1C: No results for input(s): HGBA1C in  the last 72 hours. CBG: Recent Labs  Lab 10/05/18 0004 10/05/18 0751 10/05/18 1144 10/05/18 1558 10/05/18 2138  GLUCAP 109* 125* 144* 129* 143*   Lipid Profile: No results for input(s): CHOL, HDL, LDLCALC, TRIG, CHOLHDL, LDLDIRECT in the last 72 hours. Thyroid Function Tests: No results for input(s): TSH, T4TOTAL, FREET4, T3FREE, THYROIDAB in the last 72 hours. Anemia Panel: No  results for input(s): VITAMINB12, FOLATE, FERRITIN, TIBC, IRON, RETICCTPCT in the last 72 hours. Sepsis Labs: No results for input(s): PROCALCITON, LATICACIDVEN in the last 168 hours.  Recent Results (from the past 240 hour(s))  Urine Culture     Status: Abnormal   Collection Time: 09/27/18  1:59 PM   Specimen: Urine, Random  Result Value Ref Range Status   Specimen Description URINE, RANDOM  Final   Special Requests   Final    NONE Performed at [facility], [address] [place], [place] [address]    Culture (A)  Final    >=100,000 COLONIES/mL ENTEROBACTER ASBURIAE >=100,000 COLONIES/mL CITROBACTER FREUNDII    Report Status 09/30/2018 FINAL  Final   Organism ID, Bacteria ENTEROBACTER ASBURIAE (A)  Final   Organism ID, Bacteria CITROBACTER FREUNDII (A)  Final      Susceptibility   Citrobacter freundii - MIC*    CEFAZOLIN >=64 RESISTANT Resistant     CEFTRIAXONE >=64 RESISTANT Resistant     CIPROFLOXACIN <=0.25 SENSITIVE Sensitive     GENTAMICIN <=1 SENSITIVE Sensitive     IMIPENEM 0.5 SENSITIVE Sensitive     NITROFURANTOIN <=16 SENSITIVE Sensitive     TRIMETH/SULFA <=20 SENSITIVE Sensitive     PIP/TAZO >=128 RESISTANT Resistant     * >=100,000 COLONIES/mL CITROBACTER FREUNDII   Enterobacter asburiae - MIC*    CEFAZOLIN >=64 RESISTANT Resistant     CEFTRIAXONE >=64 RESISTANT Resistant     CIPROFLOXACIN <=0.25 SENSITIVE Sensitive     GENTAMICIN <=1 SENSITIVE Sensitive     IMIPENEM 0.5 SENSITIVE Sensitive     NITROFURANTOIN 64 INTERMEDIATE Intermediate     TRIMETH/SULFA 40 SENSITIVE Sensitive     PIP/TAZO >=128 RESISTANT Resistant     * >=100,000 COLONIES/mL ENTEROBACTER ASBURIAE      Radiology Studies: No results found.      Scheduled Meds:  carvedilol  6.25 mg Per Tube BID WC   chlorhexidine gluconate (MEDLINE KIT)  15 mL Mouth Rinse BID   Chlorhexidine Gluconate Cloth  6 each Topical Q0600   clonazePAM  1 mg Per Tube TID   enoxaparin (LOVENOX)  injection  40 mg Subcutaneous Q24H   feeding supplement (JEVITY 1.2 CAL)  1,000 mL Per Tube Q24H   feeding supplement (PRO-STAT SUGAR FREE 64)  30 mL Per Tube BID   folic acid  1 mg Per Tube Daily   free water  150 mL Per Tube Q6H   mouth rinse  15 mL Mouth Rinse 10 times per day   multivitamin  15 mL Per Tube Daily   oxyCODONE  10 mg Per Tube Q6H   pantoprazole sodium  40 mg Per Tube Q24H   pregabalin  25 mg Per Tube BID   QUEtiapine  25 mg Per Tube BID   thiamine  100 mg Per Tube Daily   Continuous Infusions:  sodium chloride Stopped (10/06/18 1458)   meropenem (MERREM) IV 1 g (10/06/18 1503)    Assessment & Plan:   Acute respiratory failure with hypoxia:Likely related to accidental opioid/alcohol overdose, in the background of COPD.  Continue on trach (8/17),  with vent support-continue vent with weaning trial per PCCM,who are following twice a week-patient started on on trach collar wean 9/3 and tolerating .Sedation per critical care, currently on oxycodone Lyrica Seroquel Klonopin.  Frequent suctioning for copious secretions, RN will discuss with pulmonary.  Continue frequent suctioning.  PEA arrest in parking lot with Severe anoxic encephalopathy: PEA arrest likely from overdose.  Status post trach, remains encephalopathic. Currently supportive care, looking into SNF versus LTAC placement with vent support. Evaluated by neurology, earlier this admission EEG and MRI findings suggestive of severe anoxic brain injury.This was discussed with family, prognosis felt to be very poor.Repeat EEG 8/20 suggestive of severe encephalopathy as well.Family wants full aggressive scope of treatment. he has been restless,and increasedoxycodone back to 10 mg.Nonverbal not following commands.  Pneumonia/Fever:Completed antibiotics. Zosyn from 8/12 through 8/13 and then Rocephin from 8/15 to 8/17. blood culture no growth to date. Had fever. sputum Growing acinetobacter and Citrobacter  freundii both sensitive to cefepime-completed 8/28---8/31  And changed to meropenem 8/31to cover pathogens from urine-continue 7 more days from 8/31.overall stable, afebrile, last wbc at 8k.  Dysphagia/intermittent episodes of vomiting: Status post PEG tube 8/19. TF had to be held during the course-restarted tube feedings on 8/23, and tolerating at 45 cc/h-  KUB with no ileus, having bowel movement, off Reglan.  Diarrhea resolved.Tube feed at at 60 mL/h and tolerating continue the same.  Appreciate nutrition input.    UTI with Enterobacter species and Citrobacter. Continue meropenem for till 9/6 to cover UTI and PNA. D/C in am   Hypernatremia/hypokalemia:.  Sodium/potassium normal. Cont TF/free water.  PYP:PJKDT pressure has been soft, stopped Norvasc and BP now stable.Continue carvedilol.    Alcohol abuse: out of window for withdrawal. Continue thiamine  COPD, continue vent as above, continue nebs  Pressure injuries to heal/blood blister on upper back: Continue local wound care as suggested by wound care RN  Goals of care: Per CCM felt to have very poor prognosis with severe anoxic encephalopathy, with no downtime following cardiac arrest, based on EEG and MRI and neurology recommendation. Discussions have been held with family, they want to proceed with full scope of treatment will need vent in SNF.  CM working on placement waiting for Medicaid number    DVT prophylaxis: Lovenox Code Status: Remains full code Family / Patient Communication: No family bedside. Disposition Plan: As discussed above under goals of care     LOS: 28 days    Time spent: 25 minutes    Guilford Shi, MD [facility] Pager [phone]  If 7PM-7AM, please contact night-coverage [email or web address] Password [email or web address] 10/06/2018, 6:08 PM

## 2018-10-07 LAB — CBC
HCT: 35.6 % — ABNORMAL LOW (ref 39.0–52.0)
Hemoglobin: 11.4 g/dL — ABNORMAL LOW (ref 13.0–17.0)
MCH: 27.2 pg (ref 26.0–34.0)
MCHC: 32 g/dL (ref 30.0–36.0)
MCV: 85 fL (ref 80.0–100.0)
Platelets: 382 10*3/uL (ref 150–400)
RBC: 4.19 MIL/uL — ABNORMAL LOW (ref 4.22–5.81)
RDW: 12.7 % (ref 11.5–15.5)
WBC: 9.1 10*3/uL (ref 4.0–10.5)
nRBC: 0 % (ref 0.0–0.2)

## 2018-10-07 LAB — BASIC METABOLIC PANEL
Anion gap: 9 (ref 5–15)
BUN: 17 mg/dL (ref 8–23)
CO2: 26 mmol/L (ref 22–32)
Calcium: 9.2 mg/dL (ref 8.9–10.3)
Chloride: 102 mmol/L (ref 98–111)
Creatinine, Ser: 0.8 mg/dL (ref 0.61–1.24)
GFR calc Af Amer: >60 mL/min (ref >60)
GFR calc non Af Amer: >60 mL/min (ref >60)
Glucose, Bld: 125 mg/dL — ABNORMAL HIGH (ref 70–99)
Potassium: 4.6 mmol/L (ref 3.5–5.1)
Sodium: 137 mmol/L (ref 135–145)

## 2018-10-07 LAB — GLUCOSE, CAPILLARY
Glucose-Capillary: 104 mg/dL — ABNORMAL HIGH (ref 70–99)
Glucose-Capillary: 129 mg/dL — ABNORMAL HIGH (ref 70–99)

## 2018-10-07 MED ORDER — IPRATROPIUM-ALBUTEROL 0.5-2.5 (3) MG/3ML IN SOLN
3.0000 mL | Freq: Four times a day (QID) | RESPIRATORY_TRACT | Status: DC
  Administered 2018-10-07 – 2018-10-08 (×3): 3 mL via RESPIRATORY_TRACT
  Filled 2018-10-07 (×3): qty 3

## 2018-10-07 MED ORDER — IPRATROPIUM-ALBUTEROL 0.5-2.5 (3) MG/3ML IN SOLN: 3.0000 mL | Freq: Four times a day (QID) | RESPIRATORY_TRACT | Status: DC

## 2018-10-07 MED ORDER — PREDNISONE 20 MG PO TABS
40.0000 mg | ORAL_TABLET | Freq: Every day | ORAL | Status: AC
  Administered 2018-10-07 – 2018-10-11 (×5): 40 mg via ORAL
  Filled 2018-10-07 (×6): qty 2

## 2018-10-07 NOTE — Progress Notes (Signed)
PROGRESS NOTE    Micheal Carey  TTS:[id number] DOB: [birth year] DOA: 09/08/2018 PCP: [name], MD   Brief Narrative:  Patient is a 68 year old male who was initially found in the parking lot with PEA arrest, unknown downtime requiring medical ventilation.  UDS was positive for opiates, alcohol level was elevated at 276.  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  Prior history of COPD, alcohol abuse, chronic pain/opiate dependence.  Could not be extubated and was subsequently trached on 8/17.  Status post PEG on 8/19.  PCCM following and managing vent, sedation,  weaning trials.  Patient transfered to hospitalist team on 8/21.  Hospital course was complicated by persistent vegetation, intermittent vomiting, recurrent pneumonia, UTI.  Social worker following for disposition: LTAC versus SNF.  Assessment & Plan:   Active Problems:   Pneumonia   Acute respiratory failure (HCC)   Malnutrition of moderate degree   Cardiac arrest (HCC)   Pressure injury of skin   Status post tracheostomy ([place])   Ventilator dependence ([place])   Acute lower UTI   Acute respiratory failure with hypoxia: Most likely secondary to opiate/alcohol overdose in the background of COPD.  Initially intubated on presentation.  Unable to extubate.  Trached on 8/17.  On trach collar weaning on 9/3 and tolerating.  Frequent suctioning for copious secretions.  Found to have wheezes today: We will start on bronchodilators and steroids.  PEA arrest with severe anoxic encephalopathy: PEA suspected secondary to overdose.  Status post trach, remains encephalopathic.  Continue supportive care.  Evaluated by neurology earlier.  EEG/MRI  suggestive of severe anoxic brain injury.  Discussed with family.  Family wants full aggressive scope of treatment.  Patient has been restless, nonverbal, agitated.  Pneumonia/fever: Completed antibiotics course with Zosyn, cefepime, ceftriaxone.  Blood cultures no  growth till date.  Currently afebrile.  Sputum grew Acinetobacter and Citrobacter frenudii.  UTI with Enterobacter species/Citrobacter: Was treated with meropenem.  Complete antibiotics on 9/6.  Dysphagia/intermittent episodes of vomiting: Status post PEG on 8/19.  KUB did not show any ileus.  Having bowel movements now.  Diarrhea has resolved.  Nutrition following  Hyponatremia/hypokalemia: Resolved  Hypertension: Blood pressure was soft.  Norvasc on hold.  Continue carvedilol  Chronic alcohol abuse: Continue thiamine  COPD: Found to have bilateral expiratory wheezes today.  Will start on bronchodilators, steroids.   Patient with history of heel/upper back: Continue supportive care, wound care  Goals of care: Very poor prognosis with CNS encephalopathy based on EEG/MRI.  Several discussions held with family.  Family wants full scope of treatment.  Will need SNF with vent.  Social work as case Freight forwarder following.  Waiting for Medicaid number.   Nutrition Problem: Moderate Malnutrition Etiology: social / environmental circumstances(Hx substance abuse)      DVT prophylaxis: Lovenox Code Status: Full Family Communication: None present at the bedside Disposition Plan: Waiting for skilled nursing facility.  Prior hospitalization.  Patient is hemodynamically stable for transfer soon as the bed is available.  Consultants: PCCM, neurology  Procedures: EEG, MRI, intubation/extubation  Antimicrobials:  Anti-infectives (From admission, onward)   Start     Dose/Rate Route Frequency Ordered Stop   09/30/18 1400  meropenem (MERREM) 1 g in sodium chloride 0.9 % 100 mL IVPB     1 g 200 mL/hr over 30 Minutes Intravenous Every 8 hours 09/30/18 1119 10/07/18 0539   09/27/18 1200  ceFEPIme (MAXIPIME) 2 g in sodium chloride 0.9 % 100 mL IVPB  Status:  Discontinued  2 g 200 mL/hr over 30 Minutes Intravenous Every 8 hours 09/27/18 1133 09/30/18 1119   09/18/18 1330  ceFAZolin (ANCEF) IVPB  2g/100 mL premix     2 g 200 mL/hr over 30 Minutes Intravenous To Radiology 09/18/18 1323 09/18/18 1356   09/17/18 0900  ceFAZolin (ANCEF) IVPB 2g/100 mL premix     2 g 200 mL/hr over 30 Minutes Intravenous To Radiology 09/17/18 0849 09/18/18 0900   09/14/18 1000  cefTRIAXone (ROCEPHIN) 2 g in sodium chloride 0.9 % 100 mL IVPB  Status:  Discontinued     2 g 200 mL/hr over 30 Minutes Intravenous Every 24 hours 09/14/18 0919 09/16/18 1040   09/12/18 0000  vancomycin (VANCOCIN) 1,250 mg in sodium chloride 0.9 % 250 mL IVPB  Status:  Discontinued     1,250 mg 166.7 mL/hr over 90 Minutes Intravenous Every 24 hours 09/11/18 0044 09/11/18 1229   09/12/18 0000  vancomycin (VANCOCIN) IVPB 1000 mg/200 mL premix  Status:  Discontinued     1,000 mg 200 mL/hr over 60 Minutes Intravenous Every 24 hours 09/11/18 1229 09/13/18 1117   09/11/18 0800  piperacillin-tazobactam (ZOSYN) IVPB 3.375 g  Status:  Discontinued     3.375 g 12.5 mL/hr over 240 Minutes Intravenous Every 8 hours 09/11/18 0044 09/13/18 1117   09/11/18 0045  piperacillin-tazobactam (ZOSYN) IVPB 3.375 g     3.375 g 100 mL/hr over 30 Minutes Intravenous  Once 09/11/18 0040 09/11/18 0232   09/11/18 0045  vancomycin (VANCOCIN) 1,500 mg in sodium chloride 0.9 % 500 mL IVPB     1,500 mg 250 mL/hr over 120 Minutes Intravenous  Once 09/11/18 0040 09/11/18 0447      Subjective:  Patient seen and examined the bedside this morning.  Hemodynamically stable.  Looks slightly agitated, otherwise he was not in any acute distress.  Objective: Vitals:   10/07/18 0700 10/07/18 0800 10/07/18 0802 10/07/18 0827  BP: (!) 125/113 (!) 142/73  (!) 142/73  Pulse: (!) 102 99  96  Resp: (!) '21 20  17  ' Temp:   99 F (37.2 C)   TempSrc:   Axillary   SpO2: 99% 100%  100%  Weight:      Height:        Intake/Output Summary (Last 24 hours) at 10/07/2018 0916 Last data filed at 10/07/2018 0800 Gross per 24 hour  Intake 3451.25 ml  Output 1355 ml  Net  2096.25 ml   Filed Weights   10/04/18 0349 10/05/18 0500 10/07/18 0500  Weight: 61 kg 60.4 kg 60.3 kg    Examination:  General exam: Mild agitation,Not in distress,average built HEENT:Trach collar Respiratory system: Bilateral expiratory wheezes Cardiovascular system: S1 & S2 heard, RRR. No JVD, murmurs, rubs, gallops or clicks. No pedal edema. Gastrointestinal system: Abdomen is nondistended, soft and nontender. No organomegaly or masses felt. Normal bowel sounds heard.  PEG Central nervous system: Not alert and oriented.  Extremities: No edema, no clubbing ,no cyanosis, distal peripheral pulses palpable. Skin: No rashes, lesions ,no icterus ,no pallor Stage I pressure ulcer on the sacral region     Data Reviewed: I have personally reviewed following labs and imaging studies  CBC: Recent Labs  Lab 10/01/18 0254 10/02/18 0638 10/07/18 0347  WBC 10.7* 8.2 9.1  HGB 9.7* 8.9* 11.4*  HCT 30.4* 28.0* 35.6*  MCV 86.1 86.7 85.0  PLT 442* 385 859   Basic Metabolic Panel: Recent Labs  Lab 10/01/18 0254 10/02/18 0638 10/07/18 0347  NA 136 138 137  K 3.9 4.1 4.6  CL 102 104 102  CO2 '24 24 26  ' GLUCOSE 120* 96 125*  BUN '20 22 17  ' CREATININE 0.89 1.00 0.80  CALCIUM 9.0 8.7* 9.2   GFR: Estimated Creatinine Clearance: 77.5 mL/min (by C-G formula based on SCr of 0.8 mg/dL). Liver Function Tests: Recent Labs  Lab 10/02/18 0638  AST 35  ALT 19  ALKPHOS 118  BILITOT 0.4  PROT 6.1*  ALBUMIN 2.3*   No results for input(s): LIPASE, AMYLASE in the last 168 hours. No results for input(s): AMMONIA in the last 168 hours. Coagulation Profile: No results for input(s): INR, PROTIME in the last 168 hours. Cardiac Enzymes: No results for input(s): CKTOTAL, CKMB, CKMBINDEX, TROPONINI in the last 168 hours. BNP (last 3 results) No results for input(s): PROBNP in the last 8760 hours. HbA1C: No results for input(s): HGBA1C in the last 72 hours. CBG: Recent Labs  Lab 10/05/18  0751 10/05/18 1144 10/05/18 1558 10/05/18 2138 10/07/18 0028  GLUCAP 125* 144* 129* 143* 104*   Lipid Profile: No results for input(s): CHOL, HDL, LDLCALC, TRIG, CHOLHDL, LDLDIRECT in the last 72 hours. Thyroid Function Tests: No results for input(s): TSH, T4TOTAL, FREET4, T3FREE, THYROIDAB in the last 72 hours. Anemia Panel: No results for input(s): VITAMINB12, FOLATE, FERRITIN, TIBC, IRON, RETICCTPCT in the last 72 hours. Sepsis Labs: No results for input(s): PROCALCITON, LATICACIDVEN in the last 168 hours.  Recent Results (from the past 240 hour(s))  Urine Culture     Status: Abnormal   Collection Time: 09/27/18  1:59 PM   Specimen: Urine, Random  Result Value Ref Range Status   Specimen Description URINE, RANDOM  Final   Special Requests   Final    NONE Performed at [facility], [address], [place], [place] [address]    Culture (A)  Final    >=100,000 COLONIES/mL ENTEROBACTER ASBURIAE >=100,000 COLONIES/mL CITROBACTER FREUNDII    Report Status 09/30/2018 FINAL  Final   Organism ID, Bacteria ENTEROBACTER ASBURIAE (A)  Final   Organism ID, Bacteria CITROBACTER FREUNDII (A)  Final      Susceptibility   Citrobacter freundii - MIC*    CEFAZOLIN >=64 RESISTANT Resistant     CEFTRIAXONE >=64 RESISTANT Resistant     CIPROFLOXACIN <=0.25 SENSITIVE Sensitive     GENTAMICIN <=1 SENSITIVE Sensitive     IMIPENEM 0.5 SENSITIVE Sensitive     NITROFURANTOIN <=16 SENSITIVE Sensitive     TRIMETH/SULFA <=20 SENSITIVE Sensitive     PIP/TAZO >=128 RESISTANT Resistant     * >=100,000 COLONIES/mL CITROBACTER FREUNDII   Enterobacter asburiae - MIC*    CEFAZOLIN >=64 RESISTANT Resistant     CEFTRIAXONE >=64 RESISTANT Resistant     CIPROFLOXACIN <=0.25 SENSITIVE Sensitive     GENTAMICIN <=1 SENSITIVE Sensitive     IMIPENEM 0.5 SENSITIVE Sensitive     NITROFURANTOIN 64 INTERMEDIATE Intermediate     TRIMETH/SULFA 40 SENSITIVE Sensitive     PIP/TAZO >=128 RESISTANT Resistant      * >=100,000 COLONIES/mL ENTEROBACTER ASBURIAE         Radiology Studies: No results found.      Scheduled Meds: . carvedilol  6.25 mg Per Tube BID WC  . chlorhexidine gluconate (MEDLINE KIT)  15 mL Mouth Rinse BID  . Chlorhexidine Gluconate Cloth  6 each Topical Q0600  . clonazePAM  1 mg Per Tube TID  . enoxaparin (LOVENOX) injection  40 mg Subcutaneous Q24H  . feeding supplement (JEVITY 1.2 CAL)  1,000  mL Per Tube Q24H  . feeding supplement (PRO-STAT SUGAR FREE 64)  30 mL Per Tube BID  . folic acid  1 mg Per Tube Daily  . free water  150 mL Per Tube Q6H  . mouth rinse  15 mL Mouth Rinse 10 times per day  . multivitamin  15 mL Per Tube Daily  . oxyCODONE  10 mg Per Tube Q6H  . pantoprazole sodium  40 mg Per Tube Q24H  . pregabalin  25 mg Per Tube BID  . QUEtiapine  25 mg Per Tube BID  . thiamine  100 mg Per Tube Daily   Continuous Infusions: . sodium chloride 10 mL/hr at 10/07/18 0800     LOS: 29 days    Time spent: 35 mins.More than 50% of that time was spent in counseling and/or coordination of care.      Shelly Coss, MD [facility] Pager [phone]  If 7PM-7AM, please contact night-coverage [email or web address] Password [email or web address] 10/07/2018, 9:16 AM

## 2018-10-07 NOTE — Progress Notes (Signed)
CSW attempted to reach the business office at [facility] without success, left voicemail requesting a return call.  Madilyn Fireman, MSW, LCSW-A Clinical Social Worker [facility] [phone]

## 2018-10-08 LAB — NOVEL CORONAVIRUS, NAA (HOSP ORDER, SEND-OUT TO REF LAB; TAT 18-24 HRS): SARS-CoV-2, NAA: NOT DETECTED

## 2018-10-08 LAB — GLUCOSE, CAPILLARY: Glucose-Capillary: 128 mg/dL — ABNORMAL HIGH (ref 70–99)

## 2018-10-08 MED ORDER — IPRATROPIUM-ALBUTEROL 0.5-2.5 (3) MG/3ML IN SOLN
3.0000 mL | Freq: Three times a day (TID) | RESPIRATORY_TRACT | Status: DC
  Administered 2018-10-08 – 2018-10-10 (×7): 3 mL via RESPIRATORY_TRACT
  Filled 2018-10-08 (×7): qty 3

## 2018-10-08 MED ORDER — GUAIFENESIN ER 600 MG PO TB12
600.0000 mg | ORAL_TABLET | Freq: Two times a day (BID) | ORAL | Status: DC
  Administered 2018-10-08: 600 mg via ORAL
  Filled 2018-10-08 (×2): qty 1

## 2018-10-08 MED ORDER — GUAIFENESIN 100 MG/5ML PO SOLN
5.0000 mL | Freq: Four times a day (QID) | ORAL | Status: DC
  Administered 2018-10-08 – 2018-11-09 (×126): 100 mg
  Filled 2018-10-08 (×127): qty 5

## 2018-10-08 MED ORDER — WHITE PETROLATUM EX OINT
TOPICAL_OINTMENT | CUTANEOUS | Status: AC
  Administered 2018-10-08: 0.2
  Filled 2018-10-08: qty 28.35

## 2018-10-08 NOTE — Progress Notes (Signed)
PROGRESS NOTE    Micheal Carey  OEU:[id number] DOB: 09/26/1950 DOA: 09/08/2018 PCP: [name], MD   Brief Narrative:  Patient is a 68 year old male who was initially found in the parking lot with PEA arrest, unknown downtime requiring medical ventilation.  UDS was positive for opiates, alcohol level was elevated at 276.  He had prior history of COPD, alcohol abuse, chronic pain/opiate dependence.  Could not be extubated and was subsequently trached on 8/17.  Status post PEG on 8/19.  PCCM following and managing vent, sedation,  weaning trials.  Patient transfered to hospitalist team on 8/21. Patient has prolonged hospitalization. 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  Social worker following for disposition: LTAC versus SNF.  Patient is medically stable for discharge as soon as placement is found.  Assessment & Plan:   Active Problems:   Pneumonia   Acute respiratory failure (HCC)   Malnutrition of moderate degree   Cardiac arrest (HCC)   Pressure injury of skin   Status post tracheostomy ([name])   Ventilator dependence ([name])   Acute lower UTI   Acute respiratory failure with hypoxia: Most likely secondary to opiate/alcohol overdose in the background of COPD.  Initially intubated on presentation.  Unable to extubate.  Trached on 8/17.  On trach collar weaning on 9/3 and tolerating.  Requires frequent suctioning for copious secretions.  Found to have wheezes on 10/07/18 ,started on  bronchodilators and short course of  steroids.  PEA arrest with severe anoxic encephalopathy: PEA suspected secondary to overdose.  Status post trach, remains encephalopathic.  Continue supportive care.  Evaluated by neurology earlier.  EEG/MRI  suggestive of severe anoxic brain injury.  Discussed with family about goals of care.  Family wants full aggressive scope of treatment.  Patient remains in  vegetative state.  Pneumonia/fever: Completed antibiotics course with Zosyn, cefepime, ceftriaxone.  Blood cultures showed no growth till date.  Currently afebrile.  Sputum grew Acinetobacter and Citrobacter frenudii.  UTI with Enterobacter species/Citrobacter: Was treated with meropenem.  Complete antibiotics on 9/6.  Dysphagia/intermittent episodes of vomiting: Status post PEG on 8/19.  KUB did not show any ileus.  Having bowel movements now.  Diarrhea has resolved.  Nutrition following  Hyponatremia/hypokalemia: Resolved  Hypertension: Blood pressure was soft.  Norvasc on hold.  Continue carvedilol  Chronic alcohol abuse: Continue thiamine  COPD: Found to have bilateral expiratory wheezes on 10/07/18. Currently  on bronchodilators, steroids.   Pressure injuries  of heel/upper back: Continue supportive care, wound care  Goals of care: Very poor prognosis with CNS encephalopathy based on EEG/MRI.  Several discussions held with family.  Family wants full scope of treatment.  Will need SNF with vent.  Social worker/ case Freight forwarder following and working on placement.   Nutrition Problem: Moderate Malnutrition Etiology: social / environmental circumstances(Hx substance abuse)      DVT prophylaxis: Lovenox Code Status: Full Family Communication: Called son and given update on 9./8/20.He wants full scope of treatment. Disposition Plan: Waiting for skilled nursing facility.   Patient is hemodynamically stable for transfer soon as the bed is available.  Consultants: PCCM, neurology  Procedures: EEG, MRI, intubation/extubation  Antimicrobials:  Anti-infectives (From admission, onward)   Start     Dose/Rate Route Frequency Ordered Stop   09/30/18 1400  meropenem (MERREM) 1 g in sodium chloride 0.9 % 100 mL IVPB     1 g 200 mL/hr over 30 Minutes Intravenous Every  8 hours 09/30/18 1119 10/07/18 0539   09/27/18 1200  ceFEPIme (MAXIPIME) 2 g in sodium chloride 0.9 % 100 mL IVPB  Status:   Discontinued     2 g 200 mL/hr over 30 Minutes Intravenous Every 8 hours 09/27/18 1133 09/30/18 1119   09/18/18 1330  ceFAZolin (ANCEF) IVPB 2g/100 mL premix     2 g 200 mL/hr over 30 Minutes Intravenous To Radiology 09/18/18 1323 09/18/18 1356   09/17/18 0900  ceFAZolin (ANCEF) IVPB 2g/100 mL premix     2 g 200 mL/hr over 30 Minutes Intravenous To Radiology 09/17/18 0849 09/18/18 0900   09/14/18 1000  cefTRIAXone (ROCEPHIN) 2 g in sodium chloride 0.9 % 100 mL IVPB  Status:  Discontinued     2 g 200 mL/hr over 30 Minutes Intravenous Every 24 hours 09/14/18 0919 09/16/18 1040   09/12/18 0000  vancomycin (VANCOCIN) 1,250 mg in sodium chloride 0.9 % 250 mL IVPB  Status:  Discontinued     1,250 mg 166.7 mL/hr over 90 Minutes Intravenous Every 24 hours 09/11/18 0044 09/11/18 1229   09/12/18 0000  vancomycin (VANCOCIN) IVPB 1000 mg/200 mL premix  Status:  Discontinued     1,000 mg 200 mL/hr over 60 Minutes Intravenous Every 24 hours 09/11/18 1229 09/13/18 1117   09/11/18 0800  piperacillin-tazobactam (ZOSYN) IVPB 3.375 g  Status:  Discontinued     3.375 g 12.5 mL/hr over 240 Minutes Intravenous Every 8 hours 09/11/18 0044 09/13/18 1117   09/11/18 0045  piperacillin-tazobactam (ZOSYN) IVPB 3.375 g     3.375 g 100 mL/hr over 30 Minutes Intravenous  Once 09/11/18 0040 09/11/18 0232   09/11/18 0045  vancomycin (VANCOCIN) 1,500 mg in sodium chloride 0.9 % 500 mL IVPB     1,500 mg 250 mL/hr over 120 Minutes Intravenous  Once 09/11/18 0040 09/11/18 0447      Subjective:  Patient seen and examined the bedside this morning.  Currently hemodynamically stable.  Looks comfortable today.  Respiratory status stable but continues to have copious secretions and needs frequent suctioning.  Objective: Vitals:   10/08/18 0500 10/08/18 0600 10/08/18 0700 10/08/18 0749  BP: (!) 103/59 109/62 (!) 100/57 (!) 100/57  Pulse: 88 88 88 93  Resp: (!) 8 20 (!) 24 (!) 23  Temp:   98.5 F (36.9 C)   TempSrc:    Axillary   SpO2: 98% 100% 100% 98%  Weight:      Height:        Intake/Output Summary (Last 24 hours) at 10/08/2018 1042 Last data filed at 10/08/2018 0800 Gross per 24 hour  Intake 2119.79 ml  Output 2345 ml  Net -225.21 ml   Filed Weights   10/05/18 0500 10/07/18 0500 10/08/18 0111  Weight: 60.4 kg 60.3 kg 59 kg    Examination:  General exam: Not in distress, extremely debilitated/deconditioned HEENT: Trach collar, with mucus secretion  respiratory system: Bilateral equal air entry, no wheezes or crackles auscultated today. Cardiovascular system: S1 & S2 heard, RRR. No JVD, murmurs, rubs, gallops or clicks. Gastrointestinal system: Abdomen is nondistended, soft and nontender. No organomegaly or masses felt. Normal bowel sounds heard.  PEG Central nervous system: Not alert or oriented. No focal neurological deficits. Extremities: No edema, no clubbing ,no cyanosis, distal peripheral pulses palpable. Skin: Stage I pressure ulcer on the sacral region    Data Reviewed: I have personally reviewed following labs and imaging studies  CBC: Recent Labs  Lab 10/02/18 0638 10/07/18 0347  WBC 8.2 9.1  HGB 8.9* 11.4*  HCT 28.0* 35.6*  MCV 86.7 85.0  PLT 385 562   Basic Metabolic Panel: Recent Labs  Lab 10/02/18 0638 10/07/18 0347  NA 138 137  K 4.1 4.6  CL 104 102  CO2 24 26  GLUCOSE 96 125*  BUN 22 17  CREATININE 1.00 0.80  CALCIUM 8.7* 9.2   GFR: Estimated Creatinine Clearance: 75.8 mL/min (by C-G formula based on SCr of 0.8 mg/dL). Liver Function Tests: Recent Labs  Lab 10/02/18 0638  AST 35  ALT 19  ALKPHOS 118  BILITOT 0.4  PROT 6.1*  ALBUMIN 2.3*   No results for input(s): LIPASE, AMYLASE in the last 168 hours. No results for input(s): AMMONIA in the last 168 hours. Coagulation Profile: No results for input(s): INR, PROTIME in the last 168 hours. Cardiac Enzymes: No results for input(s): CKTOTAL, CKMB, CKMBINDEX, TROPONINI in the last 168 hours. BNP  (last 3 results) No results for input(s): PROBNP in the last 8760 hours. HbA1C: No results for input(s): HGBA1C in the last 72 hours. CBG: Recent Labs  Lab 10/05/18 1144 10/05/18 1558 10/05/18 2138 10/07/18 0028 10/07/18 2202  GLUCAP 144* 129* 143* 104* 129*   Lipid Profile: No results for input(s): CHOL, HDL, LDLCALC, TRIG, CHOLHDL, LDLDIRECT in the last 72 hours. Thyroid Function Tests: No results for input(s): TSH, T4TOTAL, FREET4, T3FREE, THYROIDAB in the last 72 hours. Anemia Panel: No results for input(s): VITAMINB12, FOLATE, FERRITIN, TIBC, IRON, RETICCTPCT in the last 72 hours. Sepsis Labs: No results for input(s): PROCALCITON, LATICACIDVEN in the last 168 hours.  Recent Results (from the past 240 hour(s))  Novel Coronavirus, NAA (hospital order; send-out to ref lab)     Status: None   Collection Time: 10/07/18  4:57 PM   Specimen: Nasopharyngeal Swab; Respiratory  Result Value Ref Range Status   SARS-CoV-2, NAA NOT DETECTED NOT DETECTED Final    Comment: (NOTE) This nucleic acid amplification test was developed and its performance characteristics determined by [employer]. Nucleic acid amplification tests include PCR and TMA. This test has not been FDA cleared or approved. This test has been authorized by FDA under an Emergency Use Authorization (EUA). This test is only authorized for the duration of time the declaration that circumstances exist justifying the authorization of the emergency use of in vitro diagnostic tests for detection of SARS-CoV-2 virus and/or diagnosis of COVID-19 infection under section 564(b)(1) of the Act, 21 U.S.C. 130QMV-7(Q) (1), unless the authorization is terminated or revoked sooner. When diagnostic testing is negative, the possibility of a false negative result should be considered in the context of a patient's recent exposures and the presence of clinical signs and symptoms consistent with COVID-19. An individual without  symptoms of COVID- 19 and who is not shedding SARS-CoV-2 vi rus would expect to have a negative (not detected) result in this assay. Performed At: [facility] [address] [place], [place] [id number] Rush Farmer MD UX:[id number]    [place]  Final    Comment: Performed at [facility], [place] [address], [place], [place] [address]         Radiology Studies: No results found.      Scheduled Meds:  carvedilol  6.25 mg Per Tube BID WC   chlorhexidine gluconate (MEDLINE KIT)  15 mL Mouth Rinse BID   Chlorhexidine Gluconate Cloth  6 each Topical Q0600   clonazePAM  1 mg Per Tube TID   enoxaparin (LOVENOX) injection  40 mg Subcutaneous Q24H  feeding supplement (JEVITY 1.2 CAL)  1,000 mL Per Tube Q24H   feeding supplement (PRO-STAT SUGAR FREE 64)  30 mL Per Tube BID   folic acid  1 mg Per Tube Daily   free water  150 mL Per Tube Q6H   ipratropium-albuterol  3 mL Nebulization TID   mouth rinse  15 mL Mouth Rinse 10 times per day   multivitamin  15 mL Per Tube Daily   oxyCODONE  10 mg Per Tube Q6H   pantoprazole sodium  40 mg Per Tube Q24H   predniSONE  40 mg Oral Q breakfast   pregabalin  25 mg Per Tube BID   QUEtiapine  25 mg Per Tube BID   thiamine  100 mg Per Tube Daily   Continuous Infusions:  sodium chloride 10 mL/hr at 10/08/18 0100     LOS: 30 days    Time spent: 35 mins.More than 50% of that time was spent in counseling and/or coordination of care.      Shelly Coss, MD [facility] Pager [phone]  If 7PM-7AM, please contact night-coverage [email or web address] Password [email or web address] 10/08/2018, 10:42 AM

## 2018-10-08 NOTE — Progress Notes (Addendum)
9:30am: CSW spoke with [place] at [facility] who is requesting a copy of this patient's CRE results. CSW spoke with Vaughan Basta, Geneticist, molecular to request their assistance in printing the results to send to the facility.  8:20am: CSW attempted to reach the business office at [facility] without success, left voicemail requesting a return call.  Madilyn Fireman, MSW, LCSW-A Clinical Social Worker [facility] [phone]

## 2018-10-08 NOTE — Progress Notes (Signed)
This RN accidentally pulled 2 mg folic acid. Patients order was for 1 mg. I had already crushed the tablets, therefore I discarded the extra I mg into trash. Pharmacy, [place] notified.

## 2018-10-08 NOTE — Progress Notes (Signed)
NAME:  Micheal Carey, MRN:  [id number], DOB:  05-07-1950, LOS: 30 ADMISSION DATE:  09/08/2018, CONSULTATION DATE:  09/08/2018 REFERRING MD:  Dr. Silverio LayYao, ER, CHIEF COMPLAINT:  AMS   Brief History   68 yo male found in parking lot with PEA arrest with unknown downtime.  Required intubation.  UDS positive for opiates and ETOH 276.  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  Past Medical History  COPD, ETOH, opiate abuse  Significant Diagnostic Tests:  Echo 8/10 >> EF 60 to 65%, mild MR EEG 8/12 >> profound, severe encephalopathy MRI brain 8/12 >> hypoxic/ischemic injury, moderate chronic small vessel ischemic disease EEG 8/20 >> severe encephalopathy  Micro Data:  COVID 8/09 >> negative Blood 8/09 >> negative Blood 8/12 >>NTD Sputum 8/12 >> Strep pneumoniae Urine 8/28 >> enterobacter asburiae and citrobacter Sputum 8/28 >> acinetobacter and citrobacter   Antimicrobials:  Vancomycin 8/11  Zosyn 8/11 > 8/13 Rocephin 8/15 > 8/17 Meropenem 8/31 > 9/6  Interim history/subjective:  He been on trach collar at 28% FiO2 since 9/4 and tolerating well. Having copious secretions, but able to clear them well with frequent forceful cough.   Objective   Blood pressure (!) 100/57, pulse 88, temperature 98.5 F (36.9 C), temperature source Axillary, resp. rate (!) 24, height 5\' 8"  (1.727 m), weight 59 kg, SpO2 100 %.    FiO2 (%):  [28 %] 28 %   Intake/Output Summary (Last 24 hours) at 10/08/2018 0746 Last data filed at 10/08/2018 0700 Gross per 24 hour  Intake 2249.79 ml  Output 2180 ml  Net 69.79 ml   Filed Weights   10/05/18 0500 10/07/18 0500 10/08/18 0111  Weight: 60.4 kg 60.3 kg 59 kg   Examination: General:  Frail adult male Neuro:  Unresponsive HEENT:  [place]/AT, No JVD noted, PERRL. Trach in place on trach collar. #6 cuffed.  Cardiovascular:  RRR, no MRG Lungs:  Diminished bases.  Abdomen:  Soft, non-distended, non-tender Musculoskeletal:  No acute  deformity Skin:  Intact, MMM   Resolved Hospital Problem list   Acute hypoxic resp failure, Pneumococcal PNA, PEA cardiac arrest after opiate/ETOH induced respiratory failure, Hypernatremia,Thrush  Assessment & Plan:   Chronic respiratory failure with inability to protect airway after cardiac arrest. Tracheostomy status. Failure to wean from ventilator. Hx of COPD. - Continue trach collar as able. Has tolerated since 9/4.  - Given secretions, not yet a candidate for trach downsize. - trach care per RN/RT protocol - f/u CXR intermittently - Scheduled duoneb and PRN albuterol  Recurrent HCAP with acinetobacter and citrobacter. - completed meropenem course 9/6  Persistent vegetative state. - family wishes to continue aggressive care - defer adjustment of klonopin, oxycodone, lyrica, seroquel to primary team   Best practice:  Diet: tube feeds DVT prophylaxis: lovenox GI prophylaxis: protonix Mobility: bed rest Code Status: full code Disposition: ICU  Labs:   CMP Latest Ref Rng & Units 10/07/2018 10/02/2018 10/01/2018  Glucose 70 - 99 mg/dL 086(V125(H) 96 784(O120(H)  BUN 8 - 23 mg/dL 17 22 20   Creatinine 0.61 - 1.24 mg/dL 9.620.80 9.521.00 8.410.89  Sodium 135 - 145 mmol/L 137 138 136  Potassium 3.5 - 5.1 mmol/L 4.6 4.1 3.9  Chloride 98 - 111 mmol/L 102 104 102  CO2 22 - 32 mmol/L 26 24 24   Calcium 8.9 - 10.3 mg/dL 9.2 3.2(G8.7(L) 9.0  Total Protein 6.5 - 8.1 g/dL - 6.1(L) -  Total Bilirubin 0.3 - 1.2 mg/dL - 0.4 -  Alkaline Phos 38 -  126 U/L - 118 -  AST 15 - 41 U/L - 35 -  ALT 0 - 44 U/L - 19 -   CBC Latest Ref Rng & Units 10/07/2018 10/02/2018 10/01/2018  WBC 4.0 - 10.5 K/uL 9.1 8.2 10.7(H)  Hemoglobin 13.0 - 17.0 g/dL 11.4(L) 8.9(L) 9.7(L)  Hematocrit 39.0 - 52.0 % 35.6(L) 28.0(L) 30.4(L)  Platelets 150 - 400 K/uL 382 385 442(H)   CBG (last 3)  Recent Labs    10/05/18 2138 10/07/18 0028 10/07/18 2202  GLUCAP 143* 104* 129*    Georgann Housekeeper, AGACNP-BC [facility] Pager  [phone] or [phone]  10/08/2018 7:47 AM

## 2018-10-08 NOTE — Progress Notes (Signed)
Assisted tele visit to patient with daughter.  Shakeira Rhee P, RN  

## 2018-10-09 LAB — GLUCOSE, CAPILLARY
Glucose-Capillary: 98 mg/dL (ref 70–99)
Glucose-Capillary: 122 mg/dL — ABNORMAL HIGH (ref 70–99)
Glucose-Capillary: 155 mg/dL — ABNORMAL HIGH (ref 70–99)
Glucose-Capillary: 103 mg/dL — ABNORMAL HIGH (ref 70–99)

## 2018-10-09 NOTE — Progress Notes (Signed)
PROGRESS NOTE    Micheal Carey  IRW:[id number] DOB: 1950/11/15 DOA: 09/08/2018 PCP: [name], MD   Brief Narrative:  Patient is a 68 year old male who was initially found in the parking lot with PEA arrest, unknown downtime requiring medical ventilation.  UDS was positive for opiates, alcohol level was elevated at 276.  He had prior history of COPD, alcohol abuse, chronic pain/opiate dependence.  Could not be extubated and was subsequently trached on 8/17.  Status post PEG on 8/19.  PCCM following and managing vent, sedation,  weaning trials.  Patient transfered to hospitalist team on 8/21. Patient has prolonged hospitalization. 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  Social worker following for disposition: LTAC versus SNF.  Patient is medically stable for discharge as soon as placement is found.  Assessment & Plan:   Active Problems:   Pneumonia   Acute respiratory failure (HCC)   Malnutrition of moderate degree   Cardiac arrest (HCC)   Pressure injury of skin   Tracheostomy status (HCC)   Ventilator dependence ([place])   Acute lower UTI   Acute respiratory failure with hypoxia: Most likely secondary to opiate/alcohol overdose in the background of COPD.  Initially intubated on presentation.  Unable to extubate.  Trached on 8/17.  On trach collar weaning on 9/3 and tolerating.  Requires frequent suctioning for copious secretions.  Found to have wheezes on 10/07/18 ,started on  bronchodilators and short course of  steroids.  PEA arrest with severe anoxic encephalopathy: PEA suspected secondary to overdose.  Status post trach, remains encephalopathic.  Continue supportive care.  Evaluated by neurology earlier.  EEG/MRI  suggestive of severe anoxic brain injury.  Discussed with family about goals of care.  Family wants full aggressive scope of treatment.  Patient remains in vegetative  state.  Pneumonia/fever: Completed antibiotics course with Zosyn, cefepime, ceftriaxone.  Blood cultures showed no growth till date.  Currently afebrile.  Sputum grew Acinetobacter and Citrobacter frenudii.  UTI with Enterobacter species/Citrobacter: Was treated with meropenem.  Completed antibiotics on 9/6.  Dysphagia/intermittent episodes of vomiting: Status post PEG on 8/19.  KUB did not show any ileus.  Having bowel movements now.  Diarrhea has resolved.  Nutrition following  Hyponatremia/hypokalemia: Resolved  Hypertension: Blood pressure was soft.  Norvasc on hold.  Continue carvedilol  Chronic alcohol abuse: Continue thiamine  COPD: Found to have bilateral expiratory wheezes on 10/07/18. Currently  on bronchodilators, steroids.   Pressure injuries  of heel/upper back: Continue supportive care, wound care  Goals of care: Very poor prognosis with CNS encephalopathy based on EEG/MRI.  Several discussions held with family.  Family wants full scope of treatment.  Will need SNF with vent.  Social worker/ case Freight forwarder following and working on placement.   Nutrition Problem: Moderate Malnutrition Etiology: social / environmental circumstances(Hx substance abuse)      DVT prophylaxis: Lovenox Code Status: Full Family Communication: Called son and given update on 10/08/18.He wants full scope of treatment. Disposition Plan: Waiting for skilled nursing facility.   Patient is hemodynamically stable for transfer soon as the bed is available.  Consultants: PCCM, neurology  Procedures: EEG, MRI, intubation/extubation  Antimicrobials:  Anti-infectives (From admission, onward)   Start     Dose/Rate Route Frequency Ordered Stop   09/30/18 1400  meropenem (MERREM) 1 g in sodium chloride 0.9 % 100 mL IVPB     1 g 200 mL/hr over 30 Minutes Intravenous Every 8  hours 09/30/18 1119 10/07/18 0539   09/27/18 1200  ceFEPIme (MAXIPIME) 2 g in sodium chloride 0.9 % 100 mL IVPB  Status:  Discontinued      2 g 200 mL/hr over 30 Minutes Intravenous Every 8 hours 09/27/18 1133 09/30/18 1119   09/18/18 1330  ceFAZolin (ANCEF) IVPB 2g/100 mL premix     2 g 200 mL/hr over 30 Minutes Intravenous To Radiology 09/18/18 1323 09/18/18 1356   09/17/18 0900  ceFAZolin (ANCEF) IVPB 2g/100 mL premix     2 g 200 mL/hr over 30 Minutes Intravenous To Radiology 09/17/18 0849 09/18/18 0900   09/14/18 1000  cefTRIAXone (ROCEPHIN) 2 g in sodium chloride 0.9 % 100 mL IVPB  Status:  Discontinued     2 g 200 mL/hr over 30 Minutes Intravenous Every 24 hours 09/14/18 0919 09/16/18 1040   09/12/18 0000  vancomycin (VANCOCIN) 1,250 mg in sodium chloride 0.9 % 250 mL IVPB  Status:  Discontinued     1,250 mg 166.7 mL/hr over 90 Minutes Intravenous Every 24 hours 09/11/18 0044 09/11/18 1229   09/12/18 0000  vancomycin (VANCOCIN) IVPB 1000 mg/200 mL premix  Status:  Discontinued     1,000 mg 200 mL/hr over 60 Minutes Intravenous Every 24 hours 09/11/18 1229 09/13/18 1117   09/11/18 0800  piperacillin-tazobactam (ZOSYN) IVPB 3.375 g  Status:  Discontinued     3.375 g 12.5 mL/hr over 240 Minutes Intravenous Every 8 hours 09/11/18 0044 09/13/18 1117   09/11/18 0045  piperacillin-tazobactam (ZOSYN) IVPB 3.375 g     3.375 g 100 mL/hr over 30 Minutes Intravenous  Once 09/11/18 0040 09/11/18 0232   09/11/18 0045  vancomycin (VANCOCIN) 1,500 mg in sodium chloride 0.9 % 500 mL IVPB     1,500 mg 250 mL/hr over 120 Minutes Intravenous  Once 09/11/18 0040 09/11/18 0447      Subjective:  Patient seen and examined the bedside this morning.  Hemodynamically stable.  Looked comfortable.  Not in any kind of distress.  Respiratory status stable.  Waiting for bed/authorization in skilled nursing  facility.  Social worker following.  Objective: Vitals:   10/09/18 0735 10/09/18 0800 10/09/18 0900 10/09/18 1005  BP: (!) 151/90 (!) 146/92 138/80 128/67  Pulse: (!) 101 (!) 101 90 87  Resp: '17 17 20 16  '$ Temp:      TempSrc:        SpO2: 100% 98% 100% 99%  Weight:      Height:        Intake/Output Summary (Last 24 hours) at 10/09/2018 1105 Last data filed at 10/09/2018 1000 Gross per 24 hour  Intake 2091.94 ml  Output 1200 ml  Net 891.94 ml   Filed Weights   10/05/18 0500 10/07/18 0500 10/08/18 0111  Weight: 60.4 kg 60.3 kg 59 kg    Examination:  General exam: Not in distress, extremely debilitated/deconditioned HEENT: Trach collar  respiratory system: Bilateral equal air entry, no wheezes or crackles auscultated on anterior exam. Cardiovascular system: S1 & S2 heard, RRR. No JVD, murmurs, rubs, gallops or clicks. Gastrointestinal system: Abdomen is nondistended, soft and nontender. No organomegaly or masses felt. Normal bowel sounds heard.  PEG Central nervous system: Not alert or oriented. No focal neurological deficits.Unpurposeful movement.Responds to painful stimuli Extremities: No edema, no clubbing ,no cyanosis Skin: Stage I pressure ulcer on the sacral region,deep tissue pressure injuries on the bilateral heels.    Data Reviewed: I have personally reviewed following labs and imaging studies  CBC: Recent Labs  Lab 10/07/18 0347  WBC 9.1  HGB 11.4*  HCT 35.6*  MCV 85.0  PLT 161   Basic Metabolic Panel: Recent Labs  Lab 10/07/18 0347  NA 137  K 4.6  CL 102  CO2 26  GLUCOSE 125*  BUN 17  CREATININE 0.80  CALCIUM 9.2   GFR: Estimated Creatinine Clearance: 75.8 mL/min (by C-G formula based on SCr of 0.8 mg/dL). Liver Function Tests: No results for input(s): AST, ALT, ALKPHOS, BILITOT, PROT, ALBUMIN in the last 168 hours. No results for input(s): LIPASE, AMYLASE in the last 168 hours. No results for input(s): AMMONIA in the last 168 hours. Coagulation Profile: No results for input(s): INR, PROTIME in the last 168 hours. Cardiac Enzymes: No results for input(s): CKTOTAL, CKMB, CKMBINDEX, TROPONINI in the last 168 hours. BNP (last 3 results) No results for input(s): PROBNP in the  last 8760 hours. HbA1C: No results for input(s): HGBA1C in the last 72 hours. CBG: Recent Labs  Lab 10/07/18 0028 10/07/18 2202 10/08/18 2155 10/09/18 0102 10/09/18 0443  GLUCAP 104* 129* 128* 98 122*   Lipid Profile: No results for input(s): CHOL, HDL, LDLCALC, TRIG, CHOLHDL, LDLDIRECT in the last 72 hours. Thyroid Function Tests: No results for input(s): TSH, T4TOTAL, FREET4, T3FREE, THYROIDAB in the last 72 hours. Anemia Panel: No results for input(s): VITAMINB12, FOLATE, FERRITIN, TIBC, IRON, RETICCTPCT in the last 72 hours. Sepsis Labs: No results for input(s): PROCALCITON, LATICACIDVEN in the last 168 hours.  Recent Results (from the past 240 hour(s))  Novel Coronavirus, NAA (hospital order; send-out to ref lab)     Status: None   Collection Time: 10/07/18  4:57 PM   Specimen: Nasopharyngeal Swab; Respiratory  Result Value Ref Range Status   SARS-CoV-2, NAA NOT DETECTED NOT DETECTED Final    Comment: (NOTE) This nucleic acid amplification test was developed and its performance characteristics determined by [employer]. Nucleic acid amplification tests include PCR and TMA. This test has not been FDA cleared or approved. This test has been authorized by FDA under an Emergency Use Authorization (EUA). This test is only authorized for the duration of time the declaration that circumstances exist justifying the authorization of the emergency use of in vitro diagnostic tests for detection of SARS-CoV-2 virus and/or diagnosis of COVID-19 infection under section 564(b)(1) of the Act, 21 U.S.C. 096EAV-4(U) (1), unless the authorization is terminated or revoked sooner. When diagnostic testing is negative, the possibility of a false negative result should be considered in the context of a patient's recent exposures and the presence of clinical signs and symptoms consistent with COVID-19. An individual without symptoms of COVID- 19 and who is not shedding SARS-CoV-2  vi rus would expect to have a negative (not detected) result in this assay. Performed At: [facility] [address] [place], [place] [id number] Rush Farmer MD GN:[id number]    [place]  Final    Comment: Performed at [facility], [place] [address] [place], [place] [address]         Radiology Studies: No results found.      Scheduled Meds:  carvedilol  6.25 mg Per Tube BID WC   chlorhexidine gluconate (MEDLINE KIT)  15 mL Mouth Rinse BID   Chlorhexidine Gluconate Cloth  6 each Topical Q0600   clonazePAM  1 mg Per Tube TID   enoxaparin (LOVENOX) injection  40 mg Subcutaneous Q24H   feeding supplement (JEVITY 1.2 CAL)  1,000 mL Per Tube Q24H   feeding supplement (PRO-STAT SUGAR  FREE 64)  30 mL Per Tube BID   folic acid  1 mg Per Tube Daily   free water  150 mL Per Tube Q6H   guaiFENesin  5 mL Per Tube Q6H   ipratropium-albuterol  3 mL Nebulization TID   mouth rinse  15 mL Mouth Rinse 10 times per day   multivitamin  15 mL Per Tube Daily   oxyCODONE  10 mg Per Tube Q6H   pantoprazole sodium  40 mg Per Tube Q24H   predniSONE  40 mg Oral Q breakfast   pregabalin  25 mg Per Tube BID   QUEtiapine  25 mg Per Tube BID   thiamine  100 mg Per Tube Daily   Continuous Infusions:  sodium chloride Stopped (10/08/18 2212)     LOS: 31 days    Time spent: 35 mins.More than 50% of that time was spent in counseling and/or coordination of care.      Shelly Coss, MD [facility] Pager [phone]  If 7PM-7AM, please contact night-coverage [email or web address] Password [email or web address] 10/09/2018, 11:05 AM

## 2018-10-09 NOTE — Progress Notes (Signed)
Pt transferred to 2C16 °

## 2018-10-09 NOTE — Progress Notes (Signed)
Nutrition Follow-up  DOCUMENTATION CODES:   Non-severe (moderate) malnutrition in context of social or environmental circumstances  INTERVENTION:   Continue Jevity 1.2 at 60 ml/h via PEG with Pro-stat 30 ml BID to provide 1928 kcal, 110 gm protein, 1166 ml free water daily.  Continue free water flushes 150 ml QID.  NUTRITION DIAGNOSIS:   Moderate Malnutrition related to social / environmental circumstances(Hx substance abuse) as evidenced by moderate muscle depletion, moderate fat depletion.  Ongoing   GOAL:   Patient will meet greater than or equal to 90% of their needs  Met with TF  MONITOR:   Vent status, TF tolerance, Labs  ASSESSMENT:   68 yo male admitted with PEA arrest after being found down in a parking lot. Urine screen positive for opiates, ETOH positive. PMH includes alcohol and opiate abuse, CAP.  Patient is now on trach collar. CSW is working on D/C plans. Patient is medically stable for discharge.  PEG in place, receiving Jevity 1.2 at 60 ml/h with Prostat 30 ml BID to provide 1928 kcals, 110 gm protein, 1166 ml free water daily. Tolerating TF well. Receiving 150 ml free water flushes every 6 hours.  Labs reviewed.  CBG's: [phone]  Medications reviewed and include folic acid, MVI, prednisone, thiamine.   I/O Net +11 L since admission  Admission weight 65.9 kg, current weight 59 kg.  Diet Order:   Diet Order    None      EDUCATION NEEDS:   No education needs have been identified at this time  Skin:  Skin Assessment: Skin Integrity Issues: Skin Integrity Issues:: DTI DTI: L heel, R leg  Last BM:  9/7  Height:   Ht Readings from Last 1 Encounters:  09/08/18 _0  (1.727 m)    Weight:   Wt Readings from Last 1 Encounters:  10/08/18 59 kg    Ideal Body Weight:  70 kg  BMI:  Body mass index is 19.78 kg/m.  Estimated Nutritional Needs:   Kcal:  1750-1950 kcal  Protein:  105-120 grams  Fluid:  >/= 1.7 L/day    Molli Barrows, RD, LDN, [place] [facility] [place] Pager [phone] After Hours Pager [phone]

## 2018-10-09 NOTE — Progress Notes (Addendum)
1:30pm: CSW has spoken with Juliann Pulse at [facility] and P[place] at [facility] both stating they cannot accept the patient at this time due to his trach collar. CSW spoke with Narda Rutherford at [facility] who reports there are no beds available at the facility.  11:30am: Please disregard all notes regarding this patient's admission to [facility]. Due to the patient no longer being ventilator dependent, his insurance will not cover a Vent SNF bed at [facility]. CSW will begin seeking a new placement for patient in a SNF that can accommodate his trach. CSW notified RN and Dr. Tawanna Solo of information.   CSW spoke with Chy'vonne to inform her of new information. Chy'vonne is agreeable to CSW seeking new placement.   10:45am: CSW received return call from [place] at [facility] requesting updated clinical information be faxed to the facility for submission to [employer] for insurance authorization. Information needs to be faxed to [phone] ; Attention: [facility]. CSW sent MD documentation to facility.  CSW spoke with patient's daughter Chy'vonne to provide her with an update regarding a discharge plan. Chy'vonne is accepting to the plan for discharge to [facility].   9:30am: CSW spoke with Denyse Amass, Education officer, museum at [facility] who has a meeting scheduled to discuss this patient's admission with [facility] this morning. CSW to await return call.  Madilyn Fireman, MSW, LCSW-A Clinical Social Worker [facility] [phone]

## 2018-10-09 NOTE — Consult Note (Addendum)
[place] Nurse wound follow up Wound type: Bilat heels with deep tissue pressure injuries slowly resolving.  Dark red to outer wound areas and fading lighter red to inner areas; intact skin without open wounds or drainage..  Measurement: Left heel; 4X5cm Right heel 3X3cm Continue present plan of care with Prevalon boots to reduce pressure and promote healing. [place] team will continue to assess the locations weekly and adjust plan of care if necessary. Julien Girt MSN, RN, [place], [place], [place]

## 2018-10-10 LAB — GLUCOSE, CAPILLARY: Glucose-Capillary: 123 mg/dL — ABNORMAL HIGH (ref 70–99)

## 2018-10-10 MED ORDER — IPRATROPIUM-ALBUTEROL 0.5-2.5 (3) MG/3ML IN SOLN
3.0000 mL | Freq: Two times a day (BID) | RESPIRATORY_TRACT | Status: DC
  Administered 2018-10-10 – 2018-10-13 (×6): 3 mL via RESPIRATORY_TRACT
  Filled 2018-10-10 (×6): qty 3

## 2018-10-10 NOTE — Progress Notes (Signed)
CSW has followed up with Barbette Reichmann with [facility] for LTAC. They report they are unable to accept patient, as they would have difficulties placing him after LTAC, similar to barriers CSW is having currently placing to SNF.   Patient's insurance will not approve SNF, as SNF level of rehab is for PT/OT. Patient is bed bound and vegetative state.   CSW following up with [facility] for referral to LTAC, Pending response.   [place], [place]

## 2018-10-10 NOTE — TOC Initial Note (Signed)
Transition of Care [facility]) - Initial/Assessment Note    Patient Details  Name: Micheal Carey MRN: [id number] Date of Birth: March 25, 1950  Transition of Care [name] CM/SW Contact:    Alberteen Sam, [place] Phone Number: [phone] 10/10/2018, 3:41 PM  Clinical Narrative:                  CSW spoke with patient's son Marlowe Aschoff regarding updates of placement and discharge planning. CSW explained barriers to LTAC in that they would have to discharge patient eventually from [facility] and would have barriers to finding placement similar to what CSW is finding now. 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 CSW informed him of referral sent to [facility], pending response. CSW explained if this LTAC also denies, would search for Long Term bed availability at a [facility]. This would go through patient's Medicaid, with barriers including limited Medicaid Long Term beds at most facilities.. CSW continues to work on placement options for discharge at this time.   Jamaal expressed understanding and appreciative of work CSW is doing. He reports his sister may call CSW as well, as both of them like to be updated on patient's status. Jamaal requested floor number for 2C to check on patient, CSW provided. No further questions or concerns at this time.    Expected Discharge Plan: Long Term Nursing Home Barriers to Discharge: No SNF bed   Patient Goals and CMS Choice   CMS Medicare.gov Compare Post Acute Care list provided to:: Patient Represenative (must comment)(Jamaal (son)) Choice offered to / list presented to : Adult Children  Expected Discharge Plan and Services Expected Discharge Plan: Long Term Nursing Home     Post Acute Care Choice: Long Term Acute Care (LTAC) Living arrangements for the past 2 months: Single Family Home                                      Prior  Living Arrangements/Services Living arrangements for the past 2 months: Single Family Home Lives with:: Self Patient language and need for interpreter reviewed:: Yes Do you feel safe going back to the place where you live?: No   needs LTAC or LT nursing bed  Need for Family Participation in Patient Care: Yes (Comment) Care giver support system in place?: Yes (comment)   Criminal Activity/Legal Involvement Pertinent to Current Situation/Hospitalization: No - Comment as needed  Activities of Daily Living Home Assistive Devices/Equipment: None ADL Screening (condition at time of admission) Patient's cognitive ability adequate to safely complete daily activities?: No Is the patient deaf or have difficulty hearing?: No Does the patient have difficulty seeing, even when wearing glasses/contacts?: No Does the patient have difficulty concentrating, remembering, or making decisions?: Yes Patient able to express need for assistance with ADLs?: No Does the patient have difficulty dressing or bathing?: Yes Independently performs ADLs?: No Communication: Dependent Is this a change from baseline?: Change from baseline, expected to last <3 days Dressing (OT): Dependent Is this a change from baseline?: Change from baseline, expected to last <3days Grooming: Dependent Is this a change from baseline?: Change from baseline, expected to last <3 days Feeding: Dependent Is this a change from baseline?: Change from baseline, expected to last <3 days Bathing: Dependent Is this a change from baseline?: Change from baseline, expected to  last <3 days Toileting: Dependent Is this a change from baseline?: Change from baseline, expected to last <3 days In/Out Bed: Dependent Is this a change from baseline?: Change from baseline, expected to last <3 days Walks in Home: Dependent Is this a change from baseline?: Change from baseline, expected to last <3 days Does the patient have difficulty walking or climbing  stairs?: Yes Weakness of Legs: Both Weakness of Arms/Hands: Both  Permission Sought/Granted Permission sought to share information with : Case Manager, Magazine features editoracility Contact Representative, Family Supports Permission granted to share information with : Yes, Verbal Permission Granted  Share Information with NAME: Artis FlockJamaal  Permission granted to share info w AGENCY: LTAC, LT SNFs, SNF  Permission granted to share info w Relationship: son  Permission granted to share info w Contact Information: [phone]  Emotional Assessment Appearance:: Other (Comment Required(unable to assess) Attitude/Demeanor/Rapport: Unable to Assess Affect (typically observed): Unable to Assess   Alcohol / Substance Use: Alcohol Use, Illicit Drugs Psych Involvement: No (comment)  Admission diagnosis:  Cardiac arrest (HCC) [I46.9] Alcohol use [Z72.89] Acute respiratory failure, unspecified whether with hypoxia or hypercapnia (HCC) [J96.00] Patient Active Problem List   Diagnosis Date Noted  . Acute lower UTI 10/02/2018  . Ventilator dependence (HCC)   . Tracheostomy status (HCC)   . Pressure injury of skin 09/25/2018  . Cardiac arrest (HCC)   . Malnutrition of moderate degree 09/14/2018  . Acute respiratory failure (HCC) 09/08/2018  . Trochanteric bursitis, right hip 08/27/2017  . History of total hip arthroplasty, right 08/27/2017  . COPD (chronic obstructive pulmonary disease) (HCC) 12/03/2015  . Hypoglycemia 12/03/2015  . Sepsis (HCC) 12/03/2015  . Intractable vomiting with nausea 10/15/2015  . Transaminasemia 10/15/2015  . Diarrhea 10/15/2015  . Lactic acidosis 10/14/2015  . AKI (acute kidney injury) (HCC) 10/14/2015  . Alcohol abuse 10/14/2015  . COPD with acute exacerbation (HCC) 10/14/2015  . Pneumonia 10/14/2015  . Atypical lymphocytes present on peripheral blood smear   . Dehydration    PCP:  Fleet ContrasAvbuere, Edwin, MD Pharmacy:   [facility] [phone]#[id number] [place] - [address] AT  [facility] [address] [place] [address] Phone: [phone] Fax: [phone]  [facility][id number] - [place] - [address] AT [facility] OF [address] & [address] [place] [address] Phone: [phone] Fax: [phone]     Social Determinants of Health (SDOH) Interventions    Readmission Risk Interventions No flowsheet data found.

## 2018-10-10 NOTE — Progress Notes (Signed)
PROGRESS NOTE    Micheal Carey  IRW:[id number] DOB: 1950/11/15 DOA: 09/08/2018 PCP: [name], MD   Brief Narrative:  Patient is a 68 year old male who was initially found in the parking lot with PEA arrest, unknown downtime requiring medical ventilation.  UDS was positive for opiates, alcohol level was elevated at 276.  He had prior history of COPD, alcohol abuse, chronic pain/opiate dependence.  Could not be extubated and was subsequently trached on 8/17.  Status post PEG on 8/19.  PCCM following and managing vent, sedation,  weaning trials.  Patient transfered to hospitalist team on 8/21. Patient has prolonged hospitalization. 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  Social worker following for disposition: LTAC versus SNF.  Patient is medically stable for discharge as soon as placement is found.  Assessment & Plan:   Active Problems:   Pneumonia   Acute respiratory failure (HCC)   Malnutrition of moderate degree   Cardiac arrest (HCC)   Pressure injury of skin   Tracheostomy status (HCC)   Ventilator dependence ([place])   Acute lower UTI   Acute respiratory failure with hypoxia: Most likely secondary to opiate/alcohol overdose in the background of COPD.  Initially intubated on presentation.  Unable to extubate.  Trached on 8/17.  On trach collar weaning on 9/3 and tolerating.  Requires frequent suctioning for copious secretions.  Found to have wheezes on 10/07/18 ,started on  bronchodilators and short course of  steroids.  PEA arrest with severe anoxic encephalopathy: PEA suspected secondary to overdose.  Status post trach, remains encephalopathic.  Continue supportive care.  Evaluated by neurology earlier.  EEG/MRI  suggestive of severe anoxic brain injury.  Discussed with family about goals of care.  Family wants full aggressive scope of treatment.  Patient remains in vegetative  state.  Pneumonia/fever: Completed antibiotics course with Zosyn, cefepime, ceftriaxone.  Blood cultures showed no growth till date.  Currently afebrile.  Sputum grew Acinetobacter and Citrobacter frenudii.  UTI with Enterobacter species/Citrobacter: Was treated with meropenem.  Completed antibiotics on 9/6.  Dysphagia/intermittent episodes of vomiting: Status post PEG on 8/19.  KUB did not show any ileus.  Having bowel movements now.  Diarrhea has resolved.  Nutrition following  Hyponatremia/hypokalemia: Resolved  Hypertension: Blood pressure was soft.  Norvasc on hold.  Continue carvedilol  Chronic alcohol abuse: Continue thiamine  COPD: Found to have bilateral expiratory wheezes on 10/07/18. Currently  on bronchodilators, steroids.   Pressure injuries  of heel/upper back: Continue supportive care, wound care  Goals of care: Very poor prognosis with CNS encephalopathy based on EEG/MRI.  Several discussions held with family.  Family wants full scope of treatment.  Will need SNF with vent.  Social worker/ case Freight forwarder following and working on placement.   Nutrition Problem: Moderate Malnutrition Etiology: social / environmental circumstances(Hx substance abuse)      DVT prophylaxis: Lovenox Code Status: Full Family Communication: Called son and given update on 10/08/18.He wants full scope of treatment. Disposition Plan: Waiting for skilled nursing facility.   Patient is hemodynamically stable for transfer soon as the bed is available.  Consultants: PCCM, neurology  Procedures: EEG, MRI, intubation/extubation  Antimicrobials:  Anti-infectives (From admission, onward)   Start     Dose/Rate Route Frequency Ordered Stop   09/30/18 1400  meropenem (MERREM) 1 g in sodium chloride 0.9 % 100 mL IVPB     1 g 200 mL/hr over 30 Minutes Intravenous Every 8  hours 09/30/18 1119 10/07/18 0539   09/27/18 1200  ceFEPIme (MAXIPIME) 2 g in sodium chloride 0.9 % 100 mL IVPB  Status:  Discontinued      2 g 200 mL/hr over 30 Minutes Intravenous Every 8 hours 09/27/18 1133 09/30/18 1119   09/18/18 1330  ceFAZolin (ANCEF) IVPB 2g/100 mL premix     2 g 200 mL/hr over 30 Minutes Intravenous To Radiology 09/18/18 1323 09/18/18 1356   09/17/18 0900  ceFAZolin (ANCEF) IVPB 2g/100 mL premix     2 g 200 mL/hr over 30 Minutes Intravenous To Radiology 09/17/18 0849 09/18/18 0900   09/14/18 1000  cefTRIAXone (ROCEPHIN) 2 g in sodium chloride 0.9 % 100 mL IVPB  Status:  Discontinued     2 g 200 mL/hr over 30 Minutes Intravenous Every 24 hours 09/14/18 0919 09/16/18 1040   09/12/18 0000  vancomycin (VANCOCIN) 1,250 mg in sodium chloride 0.9 % 250 mL IVPB  Status:  Discontinued     1,250 mg 166.7 mL/hr over 90 Minutes Intravenous Every 24 hours 09/11/18 0044 09/11/18 1229   09/12/18 0000  vancomycin (VANCOCIN) IVPB 1000 mg/200 mL premix  Status:  Discontinued     1,000 mg 200 mL/hr over 60 Minutes Intravenous Every 24 hours 09/11/18 1229 09/13/18 1117   09/11/18 0800  piperacillin-tazobactam (ZOSYN) IVPB 3.375 g  Status:  Discontinued     3.375 g 12.5 mL/hr over 240 Minutes Intravenous Every 8 hours 09/11/18 0044 09/13/18 1117   09/11/18 0045  piperacillin-tazobactam (ZOSYN) IVPB 3.375 g     3.375 g 100 mL/hr over 30 Minutes Intravenous  Once 09/11/18 0040 09/11/18 0232   09/11/18 0045  vancomycin (VANCOCIN) 1,500 mg in sodium chloride 0.9 % 500 mL IVPB     1,500 mg 250 mL/hr over 120 Minutes Intravenous  Once 09/11/18 0040 09/11/18 0447      Subjective:  Patient seen and examined the bedside this morning.  No significant change from yesterday.  Hemodynamically stable.  Objective: Vitals:   10/10/18 0723 10/10/18 0735 10/10/18 1044 10/10/18 1058  BP: 127/88 121/79 (!) 84/56 (!) 84/56  Pulse: 97 97 87 85  Resp: _0 Temp:  98.9 F (37.2 C) 98.3 F (36.8 C)   TempSrc:  Axillary Axillary   SpO2: 98% 99% 97% 97%  Weight:      Height:        Intake/Output Summary (Last 24  hours) at 10/10/2018 1151 Last data filed at 10/10/2018 1000 Gross per 24 hour  Intake 1410 ml  Output 1050 ml  Net 360 ml   Filed Weights   10/07/18 0500 10/08/18 0111 10/10/18 0403  Weight: 60.3 kg 59 kg 61.5 kg    Examination:  General exam: Not in distress, extremely debilitated/deconditioned  HEENT: Trach collar Respiratory system: Bilateral equal air entry, normal vesicular breath sounds, no wheezes or crackles on auscultation on anterior chest Cardiovascular system: S1 & S2 heard, RRR. No JVD, murmurs, rubs, gallops or clicks. Gastrointestinal system: Abdomen is nondistended, soft and nontender. No organomegaly or masses felt. Normal bowel sounds heard.  PEG Central nervous system: Not alert or oriented.  On purposeful movement.  Responds to painful stimuli.   Extremities: No edema, no clubbing ,no cyanosis Skin: Stage I pressure ulcer on the sacral region,deep tissue pressure injuries on the bilateral heels.    Data Reviewed: I have personally reviewed following labs and imaging studies  CBC: Recent Labs  Lab 10/07/18 0347  WBC 9.1  HGB 11.4*  HCT 35.6*  MCV 85.0  PLT 712   Basic Metabolic Panel: Recent Labs  Lab 10/07/18 0347  NA 137  K 4.6  CL 102  CO2 26  GLUCOSE 125*  BUN 17  CREATININE 0.80  CALCIUM 9.2   GFR: Estimated Creatinine Clearance: 79 mL/min (by C-G formula based on SCr of 0.8 mg/dL). Liver Function Tests: No results for input(s): AST, ALT, ALKPHOS, BILITOT, PROT, ALBUMIN in the last 168 hours. No results for input(s): LIPASE, AMYLASE in the last 168 hours. No results for input(s): AMMONIA in the last 168 hours. Coagulation Profile: No results for input(s): INR, PROTIME in the last 168 hours. Cardiac Enzymes: No results for input(s): CKTOTAL, CKMB, CKMBINDEX, TROPONINI in the last 168 hours. BNP (last 3 results) No results for input(s): PROBNP in the last 8760 hours. HbA1C: No results for input(s): HGBA1C in the last 72  hours. CBG: Recent Labs  Lab 10/08/18 2155 10/09/18 0102 10/09/18 0443 10/09/18 1143 10/09/18 2317  GLUCAP 128* 98 122* 155* 103*   Lipid Profile: No results for input(s): CHOL, HDL, LDLCALC, TRIG, CHOLHDL, LDLDIRECT in the last 72 hours. Thyroid Function Tests: No results for input(s): TSH, T4TOTAL, FREET4, T3FREE, THYROIDAB in the last 72 hours. Anemia Panel: No results for input(s): VITAMINB12, FOLATE, FERRITIN, TIBC, IRON, RETICCTPCT in the last 72 hours. Sepsis Labs: No results for input(s): PROCALCITON, LATICACIDVEN in the last 168 hours.  Recent Results (from the past 240 hour(s))  Novel Coronavirus, NAA (hospital order; send-out to ref lab)     Status: None   Collection Time: 10/07/18  4:57 PM   Specimen: Nasopharyngeal Swab; Respiratory  Result Value Ref Range Status   SARS-CoV-2, NAA NOT DETECTED NOT DETECTED Final    Comment: (NOTE) This nucleic acid amplification test was developed and its performance characteristics determined by [employer]. Nucleic acid amplification tests include PCR and TMA. This test has not been FDA cleared or approved. This test has been authorized by FDA under an Emergency Use Authorization (EUA). This test is only authorized for the duration of time the declaration that circumstances exist justifying the authorization of the emergency use of in vitro diagnostic tests for detection of SARS-CoV-2 virus and/or diagnosis of COVID-19 infection under section 564(b)(1) of the Act, 21 U.S.C. 929GRM-3(O) (1), unless the authorization is terminated or revoked sooner. When diagnostic testing is negative, the possibility of a false negative result should be considered in the context of a patient's recent exposures and the presence of clinical signs and symptoms consistent with COVID-19. An individual without symptoms of COVID- 19 and who is not shedding SARS-CoV-2 vi rus would expect to have a negative (not detected) result in this  assay. Performed At: [facility] [address] [place], [place] [id number] Rush Farmer MD JS:[id number]    [place]  Final    Comment: Performed at [facility], [place] [address] [place], [place] [address]         Radiology Studies: No results found.      Scheduled Meds:  carvedilol  6.25 mg Per Tube BID WC   chlorhexidine gluconate (MEDLINE KIT)  15 mL Mouth Rinse BID   Chlorhexidine Gluconate Cloth  6 each Topical Q0600   clonazePAM  1 mg Per Tube TID   enoxaparin (LOVENOX) injection  40 mg Subcutaneous Q24H   feeding supplement (JEVITY 1.2 CAL)  1,000 mL Per Tube Q24H   feeding supplement (PRO-STAT SUGAR FREE 64)  30 mL Per Tube BID  folic acid  1 mg Per Tube Daily   free water  150 mL Per Tube Q6H   guaiFENesin  5 mL Per Tube Q6H   ipratropium-albuterol  3 mL Nebulization TID   mouth rinse  15 mL Mouth Rinse 10 times per day   multivitamin  15 mL Per Tube Daily   oxyCODONE  10 mg Per Tube Q6H   pantoprazole sodium  40 mg Per Tube Q24H   predniSONE  40 mg Oral Q breakfast   pregabalin  25 mg Per Tube BID   QUEtiapine  25 mg Per Tube BID   thiamine  100 mg Per Tube Daily   Continuous Infusions:  sodium chloride Stopped (10/08/18 2212)     LOS: 32 days    Time spent: 35 mins.More than 50% of that time was spent in counseling and/or coordination of care.      Shelly Coss, MD [facility] Pager [phone]  If 7PM-7AM, please contact night-coverage [email or web address] Password [email or web address] 10/10/2018, 11:51 AM

## 2018-10-11 LAB — BASIC METABOLIC PANEL
Anion gap: 8 (ref 5–15)
BUN: 24 mg/dL — ABNORMAL HIGH (ref 8–23)
CO2: 27 mmol/L (ref 22–32)
Calcium: 9.1 mg/dL (ref 8.9–10.3)
Chloride: 103 mmol/L (ref 98–111)
Creatinine, Ser: 0.86 mg/dL (ref 0.61–1.24)
GFR calc Af Amer: >60 mL/min (ref >60)
GFR calc non Af Amer: >60 mL/min (ref >60)
Glucose, Bld: 136 mg/dL — ABNORMAL HIGH (ref 70–99)
Potassium: 4.2 mmol/L (ref 3.5–5.1)
Sodium: 138 mmol/L (ref 135–145)

## 2018-10-11 LAB — GLUCOSE, CAPILLARY
Glucose-Capillary: 139 mg/dL — ABNORMAL HIGH (ref 70–99)
Glucose-Capillary: 100 mg/dL — ABNORMAL HIGH (ref 70–99)

## 2018-10-11 LAB — CBC WITH DIFFERENTIAL/PLATELET
Abs Immature Granulocytes: 0.04 10*3/uL (ref 0.00–0.07)
Basophils Absolute: 0 10*3/uL (ref 0.0–0.1)
Basophils Relative: 0 %
Eosinophils Absolute: 0 10*3/uL (ref 0.0–0.5)
Eosinophils Relative: 0 %
HCT: 34.4 % — ABNORMAL LOW (ref 39.0–52.0)
Hemoglobin: 11 g/dL — ABNORMAL LOW (ref 13.0–17.0)
Immature Granulocytes: 0 %
Lymphocytes Relative: 16 %
Lymphs Abs: 1.6 10*3/uL (ref 0.7–4.0)
MCH: 26.7 pg (ref 26.0–34.0)
MCHC: 32 g/dL (ref 30.0–36.0)
MCV: 83.5 fL (ref 80.0–100.0)
Monocytes Absolute: 0.7 10*3/uL (ref 0.1–1.0)
Monocytes Relative: 7 %
Neutro Abs: 7.4 10*3/uL (ref 1.7–7.7)
Neutrophils Relative %: 77 %
Platelets: 315 10*3/uL (ref 150–400)
RBC: 4.12 MIL/uL — ABNORMAL LOW (ref 4.22–5.81)
RDW: 13.2 % (ref 11.5–15.5)
WBC: 9.8 10*3/uL (ref 4.0–10.5)
nRBC: 0 % (ref 0.0–0.2)

## 2018-10-11 NOTE — Progress Notes (Addendum)
Would "wiggle worm" in bed,  Would frequent pull him up in bed. No amount of repositioning done  to keep him still. Klonopin  and oxy. Given. Continue to monitor.

## 2018-10-11 NOTE — Progress Notes (Signed)
ATC tubing and water bottle changed.

## 2018-10-11 NOTE — Progress Notes (Signed)
PROGRESS NOTE    Micheal Carey  JXB:[id number] DOB: 1950/04/21 DOA: 09/08/2018 PCP: [name], MD   Brief Narrative:  Patient is a 68 year old male who was initially found in the parking lot with PEA arrest, unknown downtime requiring medical ventilation.  UDS was positive for opiates, alcohol level was elevated at 276.  He had prior history of COPD, alcohol abuse, chronic pain/opiate dependence.  Could not be extubated and was subsequently trached on 8/17.  Status post PEG on 8/19.  PCCM following and managing vent, sedation,  weaning trials.  Patient transfered to hospitalist team on 8/21. Patient has prolonged hospitalization. 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  Social worker following for disposition: LTAC versus SNF.  LTAC placement is difficult because of disposition.  Patient did not qualify for skilled nursing facility due to insurance issues.  SW checking for [facility] and long-term bed availability at a nursing facility.  Patient is medically stable for discharge as soon as placement is found.  Assessment & Plan:   Active Problems:   Pneumonia   Acute respiratory failure (HCC)   Malnutrition of moderate degree   Cardiac arrest (HCC)   Pressure injury of skin   Tracheostomy status (HCC)   Ventilator dependence ([place])   Acute lower UTI   Acute respiratory failure with hypoxia: Most likely secondary to opiate/alcohol overdose in the background of COPD.  Initially intubated on presentation.  Unable to extubate.  Trached on 8/17.  On trach collar weaning on 9/3 and tolerating.  Requires frequent suctioning for copious secretions.  Found to have wheezes on 10/07/18 ,started on  bronchodilators and short course of  Steroids. which he completed.  PEA arrest with severe anoxic encephalopathy: PEA suspected secondary to overdose.  Status post trach, remains encephalopathic.   Continue supportive care.  Evaluated by neurology earlier.  EEG/MRI  suggestive of severe anoxic brain injury.  Discussed with family about goals of care.  Family wants full aggressive scope of treatment.  Patient remains in vegetative state.  Pneumonia/fever: Completed antibiotics course with Zosyn, cefepime, ceftriaxone.  Blood cultures showed no growth till date.  Currently afebrile.  Sputum grew Acinetobacter and Citrobacter frenudii.  UTI with Enterobacter species/Citrobacter: Was treated with meropenem.  Completed antibiotics on 9/6.  Dysphagia/intermittent episodes of vomiting: Status post PEG on 8/19.  KUB did not show any ileus.  Having bowel movements now.  Diarrhea has resolved.  Nutrition following  Hyponatremia/hypokalemia: Resolved  Hypertension: Blood pressure was soft.  Norvasc on hold.  Continue carvedilol  Chronic alcohol abuse: Continue thiamine  COPD: Found to have bilateral expiratory wheezes on 10/07/18. Currently  on bronchodilators, steroids.   Pressure injuries  of heel/upper back: Continue supportive care, wound care  Goals of care: Very poor prognosis with CNS encephalopathy based on EEG/MRI.  Several discussions held with family.  Family wants full scope of treatment.  Will need SNF with vent.  Social worker/ case Freight forwarder following and working on placement.   Nutrition Problem: Moderate Malnutrition Etiology: social / environmental circumstances(Hx substance abuse)      DVT prophylaxis: Lovenox Code Status: Full Family Communication: Called son and given update on 10/08/18.He wants full scope of treatment. Disposition Plan: Waiting for skilled nursing facility vs LTACH.   Patient is hemodynamically stable for transfer assoon as the bed is available.  Consultants: PCCM, neurology  Procedures: EEG, MRI, intubation/extubation  Antimicrobials:  Anti-infectives (From admission, onward)   Start  Dose/Rate Route Frequency Ordered Stop   09/30/18 1400   meropenem (MERREM) 1 g in sodium chloride 0.9 % 100 mL IVPB     1 g 200 mL/hr over 30 Minutes Intravenous Every 8 hours 09/30/18 1119 10/07/18 0539   09/27/18 1200  ceFEPIme (MAXIPIME) 2 g in sodium chloride 0.9 % 100 mL IVPB  Status:  Discontinued     2 g 200 mL/hr over 30 Minutes Intravenous Every 8 hours 09/27/18 1133 09/30/18 1119   09/18/18 1330  ceFAZolin (ANCEF) IVPB 2g/100 mL premix     2 g 200 mL/hr over 30 Minutes Intravenous To Radiology 09/18/18 1323 09/18/18 1356   09/17/18 0900  ceFAZolin (ANCEF) IVPB 2g/100 mL premix     2 g 200 mL/hr over 30 Minutes Intravenous To Radiology 09/17/18 0849 09/18/18 0900   09/14/18 1000  cefTRIAXone (ROCEPHIN) 2 g in sodium chloride 0.9 % 100 mL IVPB  Status:  Discontinued     2 g 200 mL/hr over 30 Minutes Intravenous Every 24 hours 09/14/18 0919 09/16/18 1040   09/12/18 0000  vancomycin (VANCOCIN) 1,250 mg in sodium chloride 0.9 % 250 mL IVPB  Status:  Discontinued     1,250 mg 166.7 mL/hr over 90 Minutes Intravenous Every 24 hours 09/11/18 0044 09/11/18 1229   09/12/18 0000  vancomycin (VANCOCIN) IVPB 1000 mg/200 mL premix  Status:  Discontinued     1,000 mg 200 mL/hr over 60 Minutes Intravenous Every 24 hours 09/11/18 1229 09/13/18 1117   09/11/18 0800  piperacillin-tazobactam (ZOSYN) IVPB 3.375 g  Status:  Discontinued     3.375 g 12.5 mL/hr over 240 Minutes Intravenous Every 8 hours 09/11/18 0044 09/13/18 1117   09/11/18 0045  piperacillin-tazobactam (ZOSYN) IVPB 3.375 g     3.375 g 100 mL/hr over 30 Minutes Intravenous  Once 09/11/18 0040 09/11/18 0232   09/11/18 0045  vancomycin (VANCOCIN) 1,500 mg in sodium chloride 0.9 % 500 mL IVPB     1,500 mg 250 mL/hr over 120 Minutes Intravenous  Once 09/11/18 0040 09/11/18 0447      Subjective:  Patient seen and examined the bedside this morning.  Hemodynamically stable.  No significant change from yesterday.  Looks comfortable.  Objective: Vitals:   10/11/18 0434 10/11/18 0720  10/11/18 0752 10/11/18 0758  BP:      Pulse:      Resp:      Temp:  98.6 F (37 C)    TempSrc:  Oral    SpO2:   98% 98%  Weight: 62.9 kg     Height:        Intake/Output Summary (Last 24 hours) at 10/11/2018 1123 Last data filed at 10/11/2018 1018 Gross per 24 hour  Intake 1110 ml  Output 1250 ml  Net -140 ml   Filed Weights   10/08/18 0111 10/10/18 0403 10/11/18 0434  Weight: 59 kg 61.5 kg 62.9 kg    Examination:  General exam: Not in distress, extremely debilitated/deconditioned  HEENT: Trach collar Respiratory system: Bilateral equal air entry, normal vesicular breath sounds, no wheezes or crackles on auscultation on anterior chest Cardiovascular system: S1 & S2 heard, RRR. No JVD, murmurs, rubs, gallops or clicks. Gastrointestinal system: Abdomen is nondistended, soft and nontender. No organomegaly or masses felt. Normal bowel sounds heard.  PEG Central nervous system: Not alert or oriented.  unpurposeful movements. Moves extremities.Doesnot communicate. Responds to painful stimuli.   Extremities: No edema, no clubbing ,no cyanosis Skin: Stage I pressure ulcer on the sacral region,deep  tissue pressure injuries on the bilateral heels.    Data Reviewed: I have personally reviewed following labs and imaging studies  CBC: Recent Labs  Lab 10/07/18 0347 10/11/18 0214  WBC 9.1 9.8  NEUTROABS  --  7.4  HGB 11.4* 11.0*  HCT 35.6* 34.4*  MCV 85.0 83.5  PLT 382 321   Basic Metabolic Panel: Recent Labs  Lab 10/07/18 0347 10/11/18 0214  NA 137 138  K 4.6 4.2  CL 102 103  CO2 26 27  GLUCOSE 125* 136*  BUN 17 24*  CREATININE 0.80 0.86  CALCIUM 9.2 9.1   GFR: Estimated Creatinine Clearance: 75.2 mL/min (by C-G formula based on SCr of 0.86 mg/dL). Liver Function Tests: No results for input(s): AST, ALT, ALKPHOS, BILITOT, PROT, ALBUMIN in the last 168 hours. No results for input(s): LIPASE, AMYLASE in the last 168 hours. No results for input(s): AMMONIA in the  last 168 hours. Coagulation Profile: No results for input(s): INR, PROTIME in the last 168 hours. Cardiac Enzymes: No results for input(s): CKTOTAL, CKMB, CKMBINDEX, TROPONINI in the last 168 hours. BNP (last 3 results) No results for input(s): PROBNP in the last 8760 hours. HbA1C: No results for input(s): HGBA1C in the last 72 hours. CBG: Recent Labs  Lab 10/09/18 0102 10/09/18 0443 10/09/18 1143 10/09/18 2317 10/10/18 2122  GLUCAP 98 122* 155* 103* 123*   Lipid Profile: No results for input(s): CHOL, HDL, LDLCALC, TRIG, CHOLHDL, LDLDIRECT in the last 72 hours. Thyroid Function Tests: No results for input(s): TSH, T4TOTAL, FREET4, T3FREE, THYROIDAB in the last 72 hours. Anemia Panel: No results for input(s): VITAMINB12, FOLATE, FERRITIN, TIBC, IRON, RETICCTPCT in the last 72 hours. Sepsis Labs: No results for input(s): PROCALCITON, LATICACIDVEN in the last 168 hours.  Recent Results (from the past 240 hour(s))  Novel Coronavirus, NAA (hospital order; send-out to ref lab)     Status: None   Collection Time: 10/07/18  4:57 PM   Specimen: Nasopharyngeal Swab; Respiratory  Result Value Ref Range Status   SARS-CoV-2, NAA NOT DETECTED NOT DETECTED Final    Comment: (NOTE) This nucleic acid amplification test was developed and its performance characteristics determined by [employer]. Nucleic acid amplification tests include PCR and TMA. This test has not been FDA cleared or approved. This test has been authorized by FDA under an Emergency Use Authorization (EUA). This test is only authorized for the duration of time the declaration that circumstances exist justifying the authorization of the emergency use of in vitro diagnostic tests for detection of SARS-CoV-2 virus and/or diagnosis of COVID-19 infection under section 564(b)(1) of the Act, 21 U.S.C. 224MGN-0(I) (1), unless the authorization is terminated or revoked sooner. When diagnostic testing is negative, the  possibility of a false negative result should be considered in the context of a patient's recent exposures and the presence of clinical signs and symptoms consistent with COVID-19. An individual without symptoms of COVID- 19 and who is not shedding SARS-CoV-2 vi rus would expect to have a negative (not detected) result in this assay. Performed At: [facility] [address] [place], [place] [id number] Rush Farmer MD QX:[id number]    [place]  Final    Comment: Performed at [facility], [place] [address], [place], [place] [address]         Radiology Studies: No results found.      Scheduled Meds:  carvedilol  6.25 mg Per Tube BID WC   chlorhexidine gluconate (MEDLINE KIT)  15 mL Mouth Rinse  BID   Chlorhexidine Gluconate Cloth  6 each Topical Q0600   clonazePAM  1 mg Per Tube TID   enoxaparin (LOVENOX) injection  40 mg Subcutaneous Q24H   feeding supplement (JEVITY 1.2 CAL)  1,000 mL Per Tube Q24H   feeding supplement (PRO-STAT SUGAR FREE 64)  30 mL Per Tube BID   folic acid  1 mg Per Tube Daily   free water  150 mL Per Tube Q6H   guaiFENesin  5 mL Per Tube Q6H   ipratropium-albuterol  3 mL Nebulization BID   mouth rinse  15 mL Mouth Rinse 10 times per day   multivitamin  15 mL Per Tube Daily   oxyCODONE  10 mg Per Tube Q6H   pantoprazole sodium  40 mg Per Tube Q24H   pregabalin  25 mg Per Tube BID   QUEtiapine  25 mg Per Tube BID   thiamine  100 mg Per Tube Daily   Continuous Infusions:  sodium chloride Stopped (10/08/18 2212)     LOS: 33 days    Time spent: 35 mins.More than 50% of that time was spent in counseling and/or coordination of care.      Shelly Coss, MD [facility] Pager [phone]  If 7PM-7AM, please contact night-coverage [email or web address] Password [facility] 10/11/2018, 11:23 AM

## 2018-10-12 LAB — GLUCOSE, CAPILLARY
Glucose-Capillary: 121 mg/dL — ABNORMAL HIGH (ref 70–99)
Glucose-Capillary: 117 mg/dL — ABNORMAL HIGH (ref 70–99)
Glucose-Capillary: 129 mg/dL — ABNORMAL HIGH (ref 70–99)

## 2018-10-12 NOTE — Progress Notes (Signed)
PROGRESS NOTE    Micheal Carey  KVQ:[id number] DOB: 1950-02-18 DOA: 09/08/2018 PCP: [name], MD   Brief Narrative:  Patient is a 68 year old male who was initially found in the parking lot with PEA arrest, unknown downtime requiring medical ventilation.  UDS was positive for opiates, alcohol level was elevated at 276.  He had prior history of COPD, alcohol abuse, chronic pain/opiate dependence.  Could not be extubated and was subsequently trached on 8/17.  Status post PEG on 8/19.  PCCM following and managing vent, sedation,  weaning trials.  Patient transfered to hospitalist team on 8/21. Patient has prolonged hospitalization. 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  Social worker following for disposition: LTAC versus SNF.  LTAC placement is difficult because of disposition.  Patient did not qualify for skilled nursing facility due to insurance issues.  SW checking for [facility] and long-term bed availability at a nursing facility.  Patient is medically stable for discharge as soon as placement is found.  Assessment & Plan:   Active Problems:   Pneumonia   Acute respiratory failure (HCC)   Malnutrition of moderate degree   Cardiac arrest (HCC)   Pressure injury of skin   Tracheostomy status (HCC)   Ventilator dependence ([place])   Acute lower UTI   Acute respiratory failure with hypoxia: Most likely secondary to opiate/alcohol overdose in the background of COPD.  Initially intubated on presentation.  Unable to extubate.  Trached on 8/17.  On trach collar weaning on 9/3 and tolerating.  Requires frequent suctioning for copious secretions.  Found to have wheezes on 10/07/18 ,started on  bronchodilators and short course of  Steroids. which he completed.  PEA arrest with severe anoxic encephalopathy: PEA suspected secondary to overdose.  Status post trach, remains encephalopathic.   Continue supportive care.  Evaluated by neurology earlier.  EEG/MRI  suggestive of severe anoxic brain injury.  Discussed with family about goals of care.  Family wants full aggressive scope of treatment.  Patient remains in vegetative state.  Pneumonia/fever: Completed antibiotics course with Zosyn, cefepime, ceftriaxone.  Blood cultures showed no growth till date.  Currently afebrile.  Sputum grew Acinetobacter and Citrobacter frenudii.  UTI with Enterobacter species/Citrobacter: Was treated with meropenem.  Completed antibiotics on 9/6.  Dysphagia/intermittent episodes of vomiting: Status post PEG on 8/19.  KUB did not show any ileus.  Having bowel movements now.  Diarrhea has resolved.  Nutrition following  Hyponatremia/hypokalemia: Resolved  Hypertension: Blood pressure was soft.  Norvasc on hold.  Continue carvedilol  Chronic alcohol abuse: Continue thiamine  COPD: Found to have bilateral expiratory wheezes on 10/07/18. Currently  on bronchodilators, steroids.   Pressure injuries  of heel/upper back: Continue supportive care, wound care  Goals of care: Very poor prognosis with CNS encephalopathy based on EEG/MRI.  Several discussions held with family.  Family wants full scope of treatment.  Will need SNF with vent.  Social worker/ case Freight forwarder following and working on placement.   Nutrition Problem: Moderate Malnutrition Etiology: social / environmental circumstances(Hx substance abuse)      DVT prophylaxis: Lovenox Code Status: Full Family Communication: Called son and given update on 10/08/18.He wants full scope of treatment. Disposition Plan: Waiting for skilled nursing facility vs LTACH.   Patient is hemodynamically stable for transfer assoon as the bed is available.  Consultants: PCCM, neurology  Procedures: EEG, MRI, intubation/extubation  Antimicrobials:  Anti-infectives (From admission, onward)   Start  Dose/Rate Route Frequency Ordered Stop   09/30/18 1400   meropenem (MERREM) 1 g in sodium chloride 0.9 % 100 mL IVPB     1 g 200 mL/hr over 30 Minutes Intravenous Every 8 hours 09/30/18 1119 10/07/18 0539   09/27/18 1200  ceFEPIme (MAXIPIME) 2 g in sodium chloride 0.9 % 100 mL IVPB  Status:  Discontinued     2 g 200 mL/hr over 30 Minutes Intravenous Every 8 hours 09/27/18 1133 09/30/18 1119   09/18/18 1330  ceFAZolin (ANCEF) IVPB 2g/100 mL premix     2 g 200 mL/hr over 30 Minutes Intravenous To Radiology 09/18/18 1323 09/18/18 1356   09/17/18 0900  ceFAZolin (ANCEF) IVPB 2g/100 mL premix     2 g 200 mL/hr over 30 Minutes Intravenous To Radiology 09/17/18 0849 09/18/18 0900   09/14/18 1000  cefTRIAXone (ROCEPHIN) 2 g in sodium chloride 0.9 % 100 mL IVPB  Status:  Discontinued     2 g 200 mL/hr over 30 Minutes Intravenous Every 24 hours 09/14/18 0919 09/16/18 1040   09/12/18 0000  vancomycin (VANCOCIN) 1,250 mg in sodium chloride 0.9 % 250 mL IVPB  Status:  Discontinued     1,250 mg 166.7 mL/hr over 90 Minutes Intravenous Every 24 hours 09/11/18 0044 09/11/18 1229   09/12/18 0000  vancomycin (VANCOCIN) IVPB 1000 mg/200 mL premix  Status:  Discontinued     1,000 mg 200 mL/hr over 60 Minutes Intravenous Every 24 hours 09/11/18 1229 09/13/18 1117   09/11/18 0800  piperacillin-tazobactam (ZOSYN) IVPB 3.375 g  Status:  Discontinued     3.375 g 12.5 mL/hr over 240 Minutes Intravenous Every 8 hours 09/11/18 0044 09/13/18 1117   09/11/18 0045  piperacillin-tazobactam (ZOSYN) IVPB 3.375 g     3.375 g 100 mL/hr over 30 Minutes Intravenous  Once 09/11/18 0040 09/11/18 0232   09/11/18 0045  vancomycin (VANCOCIN) 1,500 mg in sodium chloride 0.9 % 500 mL IVPB     1,500 mg 250 mL/hr over 120 Minutes Intravenous  Once 09/11/18 0040 09/11/18 0447      Subjective:  Patient seen and examined the bedside this morning.  Hemodynamically stable.  No new changes from yesterday.  Objective: Vitals:   10/12/18 0513 10/12/18 0748 10/12/18 0855 10/12/18 0856    BP:  127/80    Pulse:  94  (!) 103  Resp:  20  14  Temp:  98.2 F (36.8 C)    TempSrc:  Oral    SpO2:  98% 100% 99%  Weight: 59.8 kg     Height:        Intake/Output Summary (Last 24 hours) at 10/12/2018 1108 Last data filed at 10/12/2018 0305 Gross per 24 hour  Intake 1535 ml  Output 800 ml  Net 735 ml   Filed Weights   10/10/18 0403 10/11/18 0434 10/12/18 0513  Weight: 61.5 kg 62.9 kg 59.8 kg    Examination:  General exam: Not in distress, extremely debilitated/deconditioned  HEENT: Trach collar Respiratory system: Bilateral equal air entry, normal vesicular breath sounds, no wheezes or crackles on auscultation on anterior chest Cardiovascular system: S1 & S2 heard, RRR. No JVD, murmurs, rubs, gallops or clicks. Gastrointestinal system: Abdomen is nondistended, soft and nontender. No organomegaly or masses felt. Normal bowel sounds heard.  PEG Central nervous system: Not alert or oriented.  unpurposeful movements. Moves extremities.Doesnot communicate. Responds to painful stimuli.   Extremities: No edema, no clubbing ,no cyanosis Skin: Stage I pressure ulcer on the sacral region,deep tissue  pressure injuries on the bilateral heels.    Data Reviewed: I have personally reviewed following labs and imaging studies  CBC: Recent Labs  Lab 10/07/18 0347 10/11/18 0214  WBC 9.1 9.8  NEUTROABS  --  7.4  HGB 11.4* 11.0*  HCT 35.6* 34.4*  MCV 85.0 83.5  PLT 382 062   Basic Metabolic Panel: Recent Labs  Lab 10/07/18 0347 10/11/18 0214  NA 137 138  K 4.6 4.2  CL 102 103  CO2 26 27  GLUCOSE 125* 136*  BUN 17 24*  CREATININE 0.80 0.86  CALCIUM 9.2 9.1   GFR: Estimated Creatinine Clearance: 71.5 mL/min (by C-G formula based on SCr of 0.86 mg/dL). Liver Function Tests: No results for input(s): AST, ALT, ALKPHOS, BILITOT, PROT, ALBUMIN in the last 168 hours. No results for input(s): LIPASE, AMYLASE in the last 168 hours. No results for input(s): AMMONIA in the last  168 hours. Coagulation Profile: No results for input(s): INR, PROTIME in the last 168 hours. Cardiac Enzymes: No results for input(s): CKTOTAL, CKMB, CKMBINDEX, TROPONINI in the last 168 hours. BNP (last 3 results) No results for input(s): PROBNP in the last 8760 hours. HbA1C: No results for input(s): HGBA1C in the last 72 hours. CBG: Recent Labs  Lab 10/09/18 1143 10/09/18 2317 10/10/18 2122 10/11/18 1118 10/11/18 2128  GLUCAP 155* 103* 123* 139* 100*   Lipid Profile: No results for input(s): CHOL, HDL, LDLCALC, TRIG, CHOLHDL, LDLDIRECT in the last 72 hours. Thyroid Function Tests: No results for input(s): TSH, T4TOTAL, FREET4, T3FREE, THYROIDAB in the last 72 hours. Anemia Panel: No results for input(s): VITAMINB12, FOLATE, FERRITIN, TIBC, IRON, RETICCTPCT in the last 72 hours. Sepsis Labs: No results for input(s): PROCALCITON, LATICACIDVEN in the last 168 hours.  Recent Results (from the past 240 hour(s))  Novel Coronavirus, NAA (hospital order; send-out to ref lab)     Status: None   Collection Time: 10/07/18  4:57 PM   Specimen: Nasopharyngeal Swab; Respiratory  Result Value Ref Range Status   SARS-CoV-2, NAA NOT DETECTED NOT DETECTED Final    Comment: (NOTE) This nucleic acid amplification test was developed and its performance characteristics determined by [employer]. Nucleic acid amplification tests include PCR and TMA. This test has not been FDA cleared or approved. This test has been authorized by FDA under an Emergency Use Authorization (EUA). This test is only authorized for the duration of time the declaration that circumstances exist justifying the authorization of the emergency use of in vitro diagnostic tests for detection of SARS-CoV-2 virus and/or diagnosis of COVID-19 infection under section 564(b)(1) of the Act, 21 U.S.C. 376EGB-1(D) (1), unless the authorization is terminated or revoked sooner. When diagnostic testing is negative, the  possibility of a false negative result should be considered in the context of a patient's recent exposures and the presence of clinical signs and symptoms consistent with COVID-19. An individual without symptoms of COVID- 19 and who is not shedding SARS-CoV-2 vi rus would expect to have a negative (not detected) result in this assay. Performed At: [facility] [address] [place], [place] [id number] Rush Farmer MD TG:[id number]    [place]  Final    Comment: Performed at [facility], [place] [address], [place], [place] [address]         Radiology Studies: No results found.      Scheduled Meds:  carvedilol  6.25 mg Per Tube BID WC   chlorhexidine gluconate (MEDLINE KIT)  15 mL Mouth Rinse BID  Chlorhexidine Gluconate Cloth  6 each Topical Q0600   clonazePAM  1 mg Per Tube TID   enoxaparin (LOVENOX) injection  40 mg Subcutaneous Q24H   feeding supplement (JEVITY 1.2 CAL)  1,000 mL Per Tube Q24H   feeding supplement (PRO-STAT SUGAR FREE 64)  30 mL Per Tube BID   folic acid  1 mg Per Tube Daily   free water  150 mL Per Tube Q6H   guaiFENesin  5 mL Per Tube Q6H   ipratropium-albuterol  3 mL Nebulization BID   mouth rinse  15 mL Mouth Rinse 10 times per day   multivitamin  15 mL Per Tube Daily   oxyCODONE  10 mg Per Tube Q6H   pantoprazole sodium  40 mg Per Tube Q24H   pregabalin  25 mg Per Tube BID   QUEtiapine  25 mg Per Tube BID   thiamine  100 mg Per Tube Daily   Continuous Infusions:  sodium chloride Stopped (10/08/18 2212)     LOS: 34 days    Time spent: 35 mins.More than 50% of that time was spent in counseling and/or coordination of care.      Shelly Coss, MD [facility] Pager [phone]  If 7PM-7AM, please contact night-coverage [email or web address] Password [email or web address] 10/12/2018, 11:08 AM

## 2018-10-12 NOTE — Progress Notes (Signed)
Pt is disoriented. Vitals stable throughout the day. Oxygen is via trach collar @5 , FIO2 @28 , turning positioning and suctioning continue as needed and every 2 hours. Jevity feeding continue @ 60 ml /hr, will continue to monitor the patient  Palma Holter, RN

## 2018-10-13 LAB — GLUCOSE, CAPILLARY
Glucose-Capillary: 102 mg/dL — ABNORMAL HIGH (ref 70–99)
Glucose-Capillary: 135 mg/dL — ABNORMAL HIGH (ref 70–99)
Glucose-Capillary: 106 mg/dL — ABNORMAL HIGH (ref 70–99)

## 2018-10-13 NOTE — Progress Notes (Signed)
Pt is disoriented. Vitals stable throughout the day. Oxygen is via trach collar @5 , turning positioning and suctioning continue as needed and every 2 hours. Condom catheter is in place and changed twice, Jevity feeding continue @ 60 ml /hr, will continue to monitor the patient  Palma Holter, RN

## 2018-10-13 NOTE — Progress Notes (Signed)
Trach changed per RT protocols, with no complications.  #6 shiley cuffed placed, cuff deflated.  Positive CO2 exchange noted. Patient tolerating change well on 28% ATC. RT will continue to monitor.

## 2018-10-13 NOTE — Plan of Care (Signed)

## 2018-10-13 NOTE — Progress Notes (Signed)
PROGRESS NOTE    Micheal Carey  QZE:[id number] DOB: 1950-03-19 DOA: 09/08/2018 PCP: [name], MD   Brief Narrative:  Patient is a 68 year old male who was initially found in the parking lot with PEA arrest, unknown downtime requiring medical ventilation.  UDS was positive for opiates, alcohol level was elevated at 276.  He had prior history of COPD, alcohol abuse, chronic pain/opiate dependence.  Could not be extubated and was subsequently trached on 8/17.  Status post PEG on 8/19.  PCCM following and managing vent, sedation,  weaning trials.  Patient transfered to hospitalist team on 8/21. Patient has prolonged hospitalization. 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  Social worker following for disposition: LTAC versus SNF.  LTAC placement is difficult because of disposition.  Patient did not qualify for skilled nursing facility due to insurance issues.  SW checking for [facility] and long-term bed availability at a nursing facility.  Patient is medically stable for discharge as soon as placement is found.  Assessment & Plan:   Active Problems:   Pneumonia   Acute respiratory failure (HCC)   Malnutrition of moderate degree   Cardiac arrest (HCC)   Pressure injury of skin   Tracheostomy status (HCC)   Ventilator dependence ([place])   Acute lower UTI   Acute respiratory failure with hypoxia: Most likely secondary to opiate/alcohol overdose in the background of COPD.  Initially intubated on presentation.  Unable to extubate.  Trached on 8/17.  On trach collar weaning on 9/3 and tolerating.  Requires frequent suctioning for copious secretions.  Found to have wheezes on 10/07/18 ,started on  bronchodilators and short course of  Steroids which he completed.  PEA arrest with severe anoxic encephalopathy: PEA suspected secondary to overdose.  Status post trach, remains encephalopathic.   Continue supportive care.  Evaluated by neurology earlier.  EEG/MRI  suggestive of severe anoxic brain injury.  Discussed with family about goals of care.  Family wants full aggressive scope of treatment.  Patient remains in vegetative state.  Pneumonia/fever: Completed antibiotics course with Zosyn, cefepime, ceftriaxone.  Blood cultures showed no growth till date.  Currently afebrile.  Sputum grew Acinetobacter and Citrobacter frenudii.  UTI with Enterobacter species/Citrobacter: Was treated with meropenem.  Completed antibiotics on 9/6.  Dysphagia/intermittent episodes of vomiting: Status post PEG on 8/19.  KUB did not show any ileus.  Having bowel movements now.  Diarrhea has resolved.  Nutrition following  Hyponatremia/hypokalemia: Resolved  Hypertension: Blood pressure was soft.  Norvasc on hold.  Continue carvedilol  Chronic alcohol abuse: Continue thiamine  COPD: Found to have bilateral expiratory wheezes on 10/07/18. Currently  on bronchodilators, steroids.   Pressure injuries  of heel/upper back: Continue supportive care, wound care  Goals of care: Very poor prognosis with CNS encephalopathy based on EEG/MRI.  Several discussions held with family.  Family wants full scope of treatment.  Will need SNF with vent.  Social worker/ case Freight forwarder following and working on placement.   Nutrition Problem: Moderate Malnutrition Etiology: social / environmental circumstances(Hx substance abuse)      DVT prophylaxis: Lovenox Code Status: Full Family Communication: Called son and given update on 10/08/18.He wants full scope of treatment. Disposition Plan: Waiting for skilled nursing facility vs LTACH.   Patient is hemodynamically stable for transfer as soon as the bed is available.  Consultants: PCCM, neurology  Procedures: EEG, MRI, intubation/extubation  Antimicrobials:  Anti-infectives (From admission, onward)   Start  Dose/Rate Route Frequency Ordered Stop   09/30/18 1400   meropenem (MERREM) 1 g in sodium chloride 0.9 % 100 mL IVPB     1 g 200 mL/hr over 30 Minutes Intravenous Every 8 hours 09/30/18 1119 10/07/18 0539   09/27/18 1200  ceFEPIme (MAXIPIME) 2 g in sodium chloride 0.9 % 100 mL IVPB  Status:  Discontinued     2 g 200 mL/hr over 30 Minutes Intravenous Every 8 hours 09/27/18 1133 09/30/18 1119   09/18/18 1330  ceFAZolin (ANCEF) IVPB 2g/100 mL premix     2 g 200 mL/hr over 30 Minutes Intravenous To Radiology 09/18/18 1323 09/18/18 1356   09/17/18 0900  ceFAZolin (ANCEF) IVPB 2g/100 mL premix     2 g 200 mL/hr over 30 Minutes Intravenous To Radiology 09/17/18 0849 09/18/18 0900   09/14/18 1000  cefTRIAXone (ROCEPHIN) 2 g in sodium chloride 0.9 % 100 mL IVPB  Status:  Discontinued     2 g 200 mL/hr over 30 Minutes Intravenous Every 24 hours 09/14/18 0919 09/16/18 1040   09/12/18 0000  vancomycin (VANCOCIN) 1,250 mg in sodium chloride 0.9 % 250 mL IVPB  Status:  Discontinued     1,250 mg 166.7 mL/hr over 90 Minutes Intravenous Every 24 hours 09/11/18 0044 09/11/18 1229   09/12/18 0000  vancomycin (VANCOCIN) IVPB 1000 mg/200 mL premix  Status:  Discontinued     1,000 mg 200 mL/hr over 60 Minutes Intravenous Every 24 hours 09/11/18 1229 09/13/18 1117   09/11/18 0800  piperacillin-tazobactam (ZOSYN) IVPB 3.375 g  Status:  Discontinued     3.375 g 12.5 mL/hr over 240 Minutes Intravenous Every 8 hours 09/11/18 0044 09/13/18 1117   09/11/18 0045  piperacillin-tazobactam (ZOSYN) IVPB 3.375 g     3.375 g 100 mL/hr over 30 Minutes Intravenous  Once 09/11/18 0040 09/11/18 0232   09/11/18 0045  vancomycin (VANCOCIN) 1,500 mg in sodium chloride 0.9 % 500 mL IVPB     1,500 mg 250 mL/hr over 120 Minutes Intravenous  Once 09/11/18 0040 09/11/18 0447      Subjective:  Patient seen and examined the bedside this morning.  No new changes from yesterday.  Remains hemodynamically stable.  Objective: Vitals:   10/13/18 0428 10/13/18 0510 10/13/18 0732 10/13/18  0733  BP:    (!) 141/74  Pulse: 92   (!) 114  Resp: 15   16  Temp:    (!) 97.3 F (36.3 C)  TempSrc:    Axillary  SpO2: 98%  99% 100%  Weight:  60.9 kg    Height:        Intake/Output Summary (Last 24 hours) at 10/13/2018 1036 Last data filed at 10/13/2018 0300 Gross per 24 hour  Intake 1795 ml  Output 1300 ml  Net 495 ml   Filed Weights   10/11/18 0434 10/12/18 0513 10/13/18 0510  Weight: 62.9 kg 59.8 kg 60.9 kg    Examination:  General exam: Not in distress, extremely debilitated/deconditioned  HEENT: Trach collar Respiratory system: Bilateral equal air entry, normal vesicular breath sounds, no wheezes or crackles on auscultation on anterior chest Cardiovascular system: S1 & S2 heard, RRR. No JVD, murmurs, rubs, gallops or clicks. Gastrointestinal system: Abdomen is nondistended, soft and nontender. No organomegaly or masses felt. Normal bowel sounds heard.  PEG Central nervous system: Not alert or oriented.  unpurposeful movements. Moves extremities.Doesnot communicate. Responds to painful stimuli.   Extremities: No edema, no clubbing ,no cyanosis Skin: Stage I pressure ulcer on the sacral  region,deep tissue pressure injuries on the bilateral heels.    Data Reviewed: I have personally reviewed following labs and imaging studies  CBC: Recent Labs  Lab 10/07/18 0347 10/11/18 0214  WBC 9.1 9.8  NEUTROABS  --  7.4  HGB 11.4* 11.0*  HCT 35.6* 34.4*  MCV 85.0 83.5  PLT 382 124   Basic Metabolic Panel: Recent Labs  Lab 10/07/18 0347 10/11/18 0214  NA 137 138  K 4.6 4.2  CL 102 103  CO2 26 27  GLUCOSE 125* 136*  BUN 17 24*  CREATININE 0.80 0.86  CALCIUM 9.2 9.1   GFR: Estimated Creatinine Clearance: 72.8 mL/min (by C-G formula based on SCr of 0.86 mg/dL). Liver Function Tests: No results for input(s): AST, ALT, ALKPHOS, BILITOT, PROT, ALBUMIN in the last 168 hours. No results for input(s): LIPASE, AMYLASE in the last 168 hours. No results for input(s):  AMMONIA in the last 168 hours. Coagulation Profile: No results for input(s): INR, PROTIME in the last 168 hours. Cardiac Enzymes: No results for input(s): CKTOTAL, CKMB, CKMBINDEX, TROPONINI in the last 168 hours. BNP (last 3 results) No results for input(s): PROBNP in the last 8760 hours. HbA1C: No results for input(s): HGBA1C in the last 72 hours. CBG: Recent Labs  Lab 10/11/18 1118 10/11/18 2128 10/12/18 1110 10/12/18 1538 10/12/18 2130  GLUCAP 139* 100* 121* 117* 129*   Lipid Profile: No results for input(s): CHOL, HDL, LDLCALC, TRIG, CHOLHDL, LDLDIRECT in the last 72 hours. Thyroid Function Tests: No results for input(s): TSH, T4TOTAL, FREET4, T3FREE, THYROIDAB in the last 72 hours. Anemia Panel: No results for input(s): VITAMINB12, FOLATE, FERRITIN, TIBC, IRON, RETICCTPCT in the last 72 hours. Sepsis Labs: No results for input(s): PROCALCITON, LATICACIDVEN in the last 168 hours.  Recent Results (from the past 240 hour(s))  Novel Coronavirus, NAA (hospital order; send-out to ref lab)     Status: None   Collection Time: 10/07/18  4:57 PM   Specimen: Nasopharyngeal Swab; Respiratory  Result Value Ref Range Status   SARS-CoV-2, NAA NOT DETECTED NOT DETECTED Final    Comment: (NOTE) This nucleic acid amplification test was developed and its performance characteristics determined by [employer]. Nucleic acid amplification tests include PCR and TMA. This test has not been FDA cleared or approved. This test has been authorized by FDA under an Emergency Use Authorization (EUA). This test is only authorized for the duration of time the declaration that circumstances exist justifying the authorization of the emergency use of in vitro diagnostic tests for detection of SARS-CoV-2 virus and/or diagnosis of COVID-19 infection under section 564(b)(1) of the Act, 21 U.S.C. 580DXI-3(J) (1), unless the authorization is terminated or revoked sooner. When diagnostic testing is  negative, the possibility of a false negative result should be considered in the context of a patient's recent exposures and the presence of clinical signs and symptoms consistent with COVID-19. An individual without symptoms of COVID- 19 and who is not shedding SARS-CoV-2 vi rus would expect to have a negative (not detected) result in this assay. Performed At: [facility] [address] [place], [place] [id number] Rush Farmer MD BH:[id number]    [place]  Final    Comment: Performed at [facility], [place] [address], [place], [place] [address]         Radiology Studies: No results found.      Scheduled Meds:  carvedilol  6.25 mg Per Tube BID WC   chlorhexidine gluconate (MEDLINE KIT)  15 mL Mouth  Rinse BID   clonazePAM  1 mg Per Tube TID   enoxaparin (LOVENOX) injection  40 mg Subcutaneous Q24H   feeding supplement (JEVITY 1.2 CAL)  1,000 mL Per Tube Q24H   feeding supplement (PRO-STAT SUGAR FREE 64)  30 mL Per Tube BID   folic acid  1 mg Per Tube Daily   free water  150 mL Per Tube Q6H   guaiFENesin  5 mL Per Tube Q6H   ipratropium-albuterol  3 mL Nebulization BID   mouth rinse  15 mL Mouth Rinse 10 times per day   multivitamin  15 mL Per Tube Daily   oxyCODONE  10 mg Per Tube Q6H   pantoprazole sodium  40 mg Per Tube Q24H   pregabalin  25 mg Per Tube BID   QUEtiapine  25 mg Per Tube BID   thiamine  100 mg Per Tube Daily   Continuous Infusions:  sodium chloride Stopped (10/08/18 2212)     LOS: 35 days    Time spent: 35 mins.More than 50% of that time was spent in counseling and/or coordination of care.      Shelly Coss, MD [facility] Pager [phone]  If 7PM-7AM, please contact night-coverage [email or web address] Password [facility] 10/13/2018, 10:36 AM

## 2018-10-13 NOTE — Progress Notes (Signed)
Pt's BL hand cuff to support his contracture is take off for a moment to give him some break, lotion applied, will reapply later  Palma Holter, RN

## 2018-10-14 LAB — GLUCOSE, CAPILLARY
Glucose-Capillary: 177 mg/dL — ABNORMAL HIGH (ref 70–99)
Glucose-Capillary: 121 mg/dL — ABNORMAL HIGH (ref 70–99)
Glucose-Capillary: 116 mg/dL — ABNORMAL HIGH (ref 70–99)

## 2018-10-14 MED ORDER — JUVEN PO PACK
1.0000 | PACK | Freq: Two times a day (BID) | ORAL | Status: DC
  Administered 2018-10-14 – 2018-11-09 (×52): 1
  Filled 2018-10-14 (×50): qty 1

## 2018-10-14 NOTE — Progress Notes (Signed)
PROGRESS NOTE    Micheal Carey  YQM:[id number] DOB: 1950/09/27 DOA: 09/08/2018 PCP: [name], MD   Brief Narrative:  Patient is a 68 year old male who was initially found in the parking lot with PEA arrest, unknown downtime requiring medical ventilation.  UDS was positive for opiates, alcohol level was elevated at 276.  He had prior history of COPD, alcohol abuse, chronic pain/opiate dependence.  Could not be extubated and was subsequently trached on 8/17.  Status post PEG on 8/19.  PCCM following and managing vent, sedation,  weaning trials.  Patient transfered to hospitalist team on 8/21. Patient has prolonged hospitalization. 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  Social worker following for disposition: LTAC versus SNF.  LTAC placement is difficult because of disposition.  Patient did not qualify for skilled nursing facility due to insurance issues.  SW checking long-term bed availability at a nursing facility.  Patient is medically stable for discharge as soon as placement is found.  Assessment & Plan:   Active Problems:   Pneumonia   Acute respiratory failure (HCC)   Malnutrition of moderate degree   Cardiac arrest (HCC)   Pressure injury of skin   Tracheostomy status (HCC)   Ventilator dependence ([place])   Acute lower UTI   Acute respiratory failure with hypoxia: Most likely secondary to opiate/alcohol overdose in the background of COPD.  Initially intubated on presentation.  Unable to extubate.  Trached on 8/17.  On trach collar weaning on 9/3 and tolerating.  Requires frequent suctioning for copious secretions.  Found to have wheezes on 10/07/18 ,started on  bronchodilators and short course of  Steroids which he completed.  PEA arrest with severe anoxic encephalopathy: PEA suspected secondary to overdose.  Status post trach, remains encephalopathic.  Continue supportive  care.  Evaluated by neurology earlier.  EEG/MRI  suggestive of severe anoxic brain injury.  Discussed with family about goals of care.  Family wants full aggressive scope of treatment.  Patient remains in vegetative state.  Pneumonia/fever: Completed antibiotics course with Zosyn, cefepime, ceftriaxone.  Blood cultures showed no growth till date.  Currently afebrile.  Sputum grew Acinetobacter and Citrobacter frenudii.  UTI with Enterobacter species/Citrobacter: Was treated with meropenem.  Completed antibiotics on 9/6.  Dysphagia/intermittent episodes of vomiting: Status post PEG on 8/19.  KUB did not show any ileus.  Having bowel movements now.  Diarrhea has resolved.  Nutrition following  Hyponatremia/hypokalemia: Resolved  Hypertension: Blood pressure was soft.  Norvasc on hold.  Continue carvedilol  Chronic alcohol abuse: Continue thiamine  COPD: Found to have bilateral expiratory wheezes on 10/07/18. Currently  on bronchodilators, steroids.   Pressure injuries  of heel/upper back: Continue supportive care, wound care  Goals of care: Very poor prognosis with CNS encephalopathy based on EEG/MRI.  Several discussions held with family.  Family wants full scope of treatment.  Will need SNF with vent.  Social worker/ case Freight forwarder following and working on placement.   Nutrition Problem: Moderate Malnutrition Etiology: social / environmental circumstances(Hx substance abuse)      DVT prophylaxis: Lovenox Code Status: Full Family Communication: Called son and given update on 10/08/18.He wants full scope of treatment. Disposition Plan: Waiting for skilled nursing facility vs LTACH.   Patient is hemodynamically stable for transfer as soon as the bed is available.  Consultants: PCCM, neurology  Procedures: EEG, MRI, intubation/extubation  Antimicrobials:  Anti-infectives (From admission, onward)   Start  Dose/Rate Route Frequency Ordered Stop   09/30/18 1400  meropenem (MERREM) 1 g in  sodium chloride 0.9 % 100 mL IVPB     1 g 200 mL/hr over 30 Minutes Intravenous Every 8 hours 09/30/18 1119 10/07/18 0539   09/27/18 1200  ceFEPIme (MAXIPIME) 2 g in sodium chloride 0.9 % 100 mL IVPB  Status:  Discontinued     2 g 200 mL/hr over 30 Minutes Intravenous Every 8 hours 09/27/18 1133 09/30/18 1119   09/18/18 1330  ceFAZolin (ANCEF) IVPB 2g/100 mL premix     2 g 200 mL/hr over 30 Minutes Intravenous To Radiology 09/18/18 1323 09/18/18 1356   09/17/18 0900  ceFAZolin (ANCEF) IVPB 2g/100 mL premix     2 g 200 mL/hr over 30 Minutes Intravenous To Radiology 09/17/18 0849 09/18/18 0900   09/14/18 1000  cefTRIAXone (ROCEPHIN) 2 g in sodium chloride 0.9 % 100 mL IVPB  Status:  Discontinued     2 g 200 mL/hr over 30 Minutes Intravenous Every 24 hours 09/14/18 0919 09/16/18 1040   09/12/18 0000  vancomycin (VANCOCIN) 1,250 mg in sodium chloride 0.9 % 250 mL IVPB  Status:  Discontinued     1,250 mg 166.7 mL/hr over 90 Minutes Intravenous Every 24 hours 09/11/18 0044 09/11/18 1229   09/12/18 0000  vancomycin (VANCOCIN) IVPB 1000 mg/200 mL premix  Status:  Discontinued     1,000 mg 200 mL/hr over 60 Minutes Intravenous Every 24 hours 09/11/18 1229 09/13/18 1117   09/11/18 0800  piperacillin-tazobactam (ZOSYN) IVPB 3.375 g  Status:  Discontinued     3.375 g 12.5 mL/hr over 240 Minutes Intravenous Every 8 hours 09/11/18 0044 09/13/18 1117   09/11/18 0045  piperacillin-tazobactam (ZOSYN) IVPB 3.375 g     3.375 g 100 mL/hr over 30 Minutes Intravenous  Once 09/11/18 0040 09/11/18 0232   09/11/18 0045  vancomycin (VANCOCIN) 1,500 mg in sodium chloride 0.9 % 500 mL IVPB     1,500 mg 250 mL/hr over 120 Minutes Intravenous  Once 09/11/18 0040 09/11/18 0447      Subjective:  Patient seen and examined the bedside this morning.  Hemodynamically stable.  No new changes since yesterday.  Objective: Vitals:   10/14/18 0332 10/14/18 0339 10/14/18 0842 10/14/18 0902  BP:  117/72    Pulse: 94  97 (!) 107   Resp: 18 (!) '21 17 18  ' Temp:  99 F (37.2 C)  100 F (37.8 C)  TempSrc:  Axillary  Axillary  SpO2: 100% 99% 97%   Weight:  61.8 kg    Height:        Intake/Output Summary (Last 24 hours) at 10/14/2018 1028 Last data filed at 10/14/2018 0810 Gross per 24 hour  Intake 2110 ml  Output 825 ml  Net 1285 ml   Filed Weights   10/12/18 0513 10/13/18 0510 10/14/18 0339  Weight: 59.8 kg 60.9 kg 61.8 kg    Examination:  General exam: Not in distress, extremely debilitated/deconditioned  HEENT: Trach collar Respiratory system: Bilateral equal air entry, normal vesicular breath sounds, no wheezes or crackles on auscultation on anterior chest Cardiovascular system: S1 & S2 heard, RRR. No JVD, murmurs, rubs, gallops or clicks. Gastrointestinal system: Abdomen is nondistended, soft and nontender. No organomegaly or masses felt. Normal bowel sounds heard.  PEG Central nervous system: Not alert or oriented.  unpurposeful movements. Moves extremities.Doesnot communicate. Responds to painful stimuli.   Extremities: No edema, no clubbing ,no cyanosis Skin: Stage I pressure ulcer on the  sacral region,deep tissue pressure injuries on the bilateral heels.    Data Reviewed: I have personally reviewed following labs and imaging studies  CBC: Recent Labs  Lab 10/11/18 0214  WBC 9.8  NEUTROABS 7.4  HGB 11.0*  HCT 34.4*  MCV 83.5  PLT 563   Basic Metabolic Panel: Recent Labs  Lab 10/11/18 0214  NA 138  K 4.2  CL 103  CO2 27  GLUCOSE 136*  BUN 24*  CREATININE 0.86  CALCIUM 9.1   GFR: Estimated Creatinine Clearance: 73.9 mL/min (by C-G formula based on SCr of 0.86 mg/dL). Liver Function Tests: No results for input(s): AST, ALT, ALKPHOS, BILITOT, PROT, ALBUMIN in the last 168 hours. No results for input(s): LIPASE, AMYLASE in the last 168 hours. No results for input(s): AMMONIA in the last 168 hours. Coagulation Profile: No results for input(s): INR, PROTIME in the  last 168 hours. Cardiac Enzymes: No results for input(s): CKTOTAL, CKMB, CKMBINDEX, TROPONINI in the last 168 hours. BNP (last 3 results) No results for input(s): PROBNP in the last 8760 hours. HbA1C: No results for input(s): HGBA1C in the last 72 hours. CBG: Recent Labs  Lab 10/12/18 1538 10/12/18 2130 10/13/18 1111 10/13/18 1524 10/13/18 2136  GLUCAP 117* 129* 102* 135* 106*   Lipid Profile: No results for input(s): CHOL, HDL, LDLCALC, TRIG, CHOLHDL, LDLDIRECT in the last 72 hours. Thyroid Function Tests: No results for input(s): TSH, T4TOTAL, FREET4, T3FREE, THYROIDAB in the last 72 hours. Anemia Panel: No results for input(s): VITAMINB12, FOLATE, FERRITIN, TIBC, IRON, RETICCTPCT in the last 72 hours. Sepsis Labs: No results for input(s): PROCALCITON, LATICACIDVEN in the last 168 hours.  Recent Results (from the past 240 hour(s))  Novel Coronavirus, NAA (hospital order; send-out to ref lab)     Status: None   Collection Time: 10/07/18  4:57 PM   Specimen: Nasopharyngeal Swab; Respiratory  Result Value Ref Range Status   SARS-CoV-2, NAA NOT DETECTED NOT DETECTED Final    Comment: (NOTE) This nucleic acid amplification test was developed and its performance characteristics determined by [employer]. Nucleic acid amplification tests include PCR and TMA. This test has not been FDA cleared or approved. This test has been authorized by FDA under an Emergency Use Authorization (EUA). This test is only authorized for the duration of time the declaration that circumstances exist justifying the authorization of the emergency use of in vitro diagnostic tests for detection of SARS-CoV-2 virus and/or diagnosis of COVID-19 infection under section 564(b)(1) of the Act, 21 U.S.C. 149FWY-6(V) (1), unless the authorization is terminated or revoked sooner. When diagnostic testing is negative, the possibility of a false negative result should be considered in the context of a  patient's recent exposures and the presence of clinical signs and symptoms consistent with COVID-19. An individual without symptoms of COVID- 19 and who is not shedding SARS-CoV-2 vi rus would expect to have a negative (not detected) result in this assay. Performed At: [facility] [address] [place], [place] [id number] Rush Farmer MD XA:[id number]    [place]  Final    Comment: Performed at [facility], [place] [address] [place], [place] [address]         Radiology Studies: No results found.      Scheduled Meds: . carvedilol  6.25 mg Per Tube BID WC  . chlorhexidine gluconate (MEDLINE KIT)  15 mL Mouth Rinse BID  . clonazePAM  1 mg Per Tube TID  . enoxaparin (LOVENOX) injection  40 mg  Subcutaneous Q24H  . feeding supplement (JEVITY 1.2 CAL)  1,000 mL Per Tube Q24H  . feeding supplement (PRO-STAT SUGAR FREE 64)  30 mL Per Tube BID  . folic acid  1 mg Per Tube Daily  . free water  150 mL Per Tube Q6H  . guaiFENesin  5 mL Per Tube Q6H  . mouth rinse  15 mL Mouth Rinse 10 times per day  . multivitamin  15 mL Per Tube Daily  . oxyCODONE  10 mg Per Tube Q6H  . pantoprazole sodium  40 mg Per Tube Q24H  . pregabalin  25 mg Per Tube BID  . QUEtiapine  25 mg Per Tube BID  . thiamine  100 mg Per Tube Daily   Continuous Infusions: . sodium chloride Stopped (10/08/18 2212)     LOS: 36 days    Time spent: 35 mins.More than 50% of that time was spent in counseling and/or coordination of care.      Shelly Coss, MD [facility] Pager [phone]  If 7PM-7AM, please contact night-coverage [email or web address] Password [facility] 10/14/2018, 10:28 AM

## 2018-10-14 NOTE — TOC Progression Note (Signed)
Transition of Care [facility]) - Progression Note    Patient Details  Name: Micheal Carey MRN: [id number] Date of Birth: 12/24/1950  Transition of Care [facility]) CM/SW [place], [place] Phone Number: [phone] 10/14/2018, 9:42 AM  Clinical Narrative:     CSW faxed out more referrals expanding search for long term nursing home placement, pending bed offers. Patient continues to have no bed offers at this time.    Expected Discharge Plan: Long Term Nursing Home Barriers to Discharge: No SNF bed  Expected Discharge Plan and Services Expected Discharge Plan: [place]     Post Acute Care Choice: Long Term Acute Care (LTAC) Living arrangements for the past 2 months: Single Family Home                                       Social Determinants of Health (SDOH) Interventions    Readmission Risk Interventions No flowsheet data found.

## 2018-10-14 NOTE — Consult Note (Addendum)
[place] Nurse wound follow up Wound type: Bilat heels with previous deep tissue pressure injuries (DTPI) slowly resolving.   Measurement: Left heel has evolved into a stage 2 pressure injury, loose skin peels away easily, revealing pink moist skin, 3X4X.1cm, small amt pink drainage, no odor Right heel with intack dark red skin; DTPI  Slowly decreasing in size, 2X2.5cm Foam dressing to left heel to protect and promote healing. Continue present plan of care with Prevalon boots to reduce pressure. [place] team will continue to assess the locations weekly and adjust plan of care if necessary. Julien Girt MSN, RN, [place], [place], [place]

## 2018-10-14 NOTE — Progress Notes (Signed)
Nutrition Follow-up  RD working remotely.  DOCUMENTATION CODES:   Non-severe (moderate) malnutrition in context of social or environmental circumstances  INTERVENTION:   Continue tube feeds via PEG: - Jevity 1.2 @ 60 ml/hr - Pro-stat 30 ml BID  Tube feeding regimen provides 1928 kcal, 110 grams of protein, and 1166 ml of H2O.   - Continue free water flushes of 150 ml QID  - Add 1 packet Juven BID per tube, each packet provides 95 calories, 2.5 grams of protein, and 9.8 grams of carbohydrate; also contains L-arginine and L-glutamine, vitamin C, vitamin E, vitamin B-12, zinc, calcium, and calcium Beta-hydroxy-Beta-methylbutyrate to support wound healing  NUTRITION DIAGNOSIS:   Moderate Malnutrition related to social / environmental circumstances (hx substance abuse) as evidenced by moderate muscle depletion, moderate fat depletion.  Ongoing, being addressed via TF  GOAL:   Patient will meet greater than or equal to 90% of their needs  Met via TF  MONITOR:   Labs, Weight trends, TF tolerance, Skin, I & O's  REASON FOR ASSESSMENT:   Consult Enteral/tube feeding initiation and management  ASSESSMENT:   68 yo male admitted with PEA arrest after being found down in a parking lot. Urine screen positive for opiates, ETOH positive. PMH includes alcohol and opiate abuse, CAP.   8/17 - s/p trach, Cortrak placed 8/19 - s/p PEG placement 8/20 - TF held due to vomiting 8/21 - TF restarted at trickle  Pt remains medically stable for discharge pending placement. Per LCSW note today, pt continues to have no bed offers at this time.  Weight stable over the last 2 weeks. Will continue to monitor trends.  Given DTI on left heel has progressed to stage II, will order Juven to aid in wound healing.  Current TF: Jevity 1.2 @ 60 ml/hr, Pro-stat 30 ml BID, free water 150 ml QID  Medications reviewed and include: folic acid, liquid MVI, Protonix, thiamine  Labs reviewed. CBG's:  102-135 x 24 hours  UOP: 1025 ml x 24 hours  Diet Order:   Diet Order    None      EDUCATION NEEDS:   No education needs have been identified at this time  Skin:  Skin Assessment: Skin Integrity Issues: Skin Integrity Issues: DTI: right leg Stage II: left heel  Last BM:  10/12/18  Height:   Ht Readings from Last 1 Encounters:  09/08/18 _0  (1.727 m)    Weight:   Wt Readings from Last 1 Encounters:  10/14/18 61.8 kg    Ideal Body Weight:  70 kg  BMI:  Body mass index is 20.72 kg/m.  Estimated Nutritional Needs:   Kcal:  1750-1950 kcal  Protein:  105-120 grams  Fluid:  >/= 1.7 L/day    Gaynell Face, MS, RD, LDN Inpatient Clinical Dietitian Pager: [phone] Weekend/After Hours: [phone]

## 2018-10-14 NOTE — NC FL2 (Signed)
[place] LEVEL OF CARE SCREENING TOOL     IDENTIFICATION  Patient Name: Micheal Carey Birthdate: Aug 11, 1950 Sex: male Admission Date (Current Location): 09/08/2018  [facility] and [place] Number:  Herbalist and Address:  [facility], [place] [address] [place], [place] [address]      Provider Number: [id number]  Attending Physician Name and Address:  Shelly Coss, MD  Relative Name and Phone Number:  [name], [phone]    Current Level of Care: Hospital Recommended Level of Care: [place], Other (Comment)(long term placement) Prior Approval Number:    Date Approved/Denied:   PASRR Number: [id number]  Discharge Plan: SNF    Current Diagnoses: Patient Active Problem List   Diagnosis Date Noted  . Acute lower UTI 10/02/2018  . Ventilator dependence ([place])   . Tracheostomy status ([place])   . Pressure injury of skin 09/25/2018  . Cardiac arrest ([place])   . Malnutrition of moderate degree 09/14/2018  . Acute respiratory failure ([place]) 09/08/2018  . Trochanteric bursitis, right hip 08/27/2017  . History of total hip arthroplasty, right 08/27/2017  . COPD (chronic obstructive pulmonary disease) ([place]) 12/03/2015  . Hypoglycemia 12/03/2015  . Sepsis ([place]) 12/03/2015  . Intractable vomiting with nausea 10/15/2015  . Transaminasemia 10/15/2015  . Diarrhea 10/15/2015  . Lactic acidosis 10/14/2015  . AKI (acute kidney injury) ([place]) 10/14/2015  . Alcohol abuse 10/14/2015  . COPD with acute exacerbation ([place]) 10/14/2015  . Pneumonia 10/14/2015  . Atypical lymphocytes present on peripheral blood smear   . Dehydration     Orientation RESPIRATION BLADDER Height & Weight        Tracheostomy(12m cuffed, 28%, has suctioning needs) Incontinent, Indwelling catheter Weight: 136 lb 3.9 oz (61.8 kg) Height:  '5\' 8"'  (172.7 cm)  BEHAVIORAL SYMPTOMS/MOOD NEUROLOGICAL BOWEL NUTRITION STATUS      Incontinent Feeding  tube(continuous)  AMBULATORY STATUS COMMUNICATION OF NEEDS Skin   Total Care Does not communicate Other (Comment)(Heel wounds)                       Personal Care Assistance Level of Assistance  Total care, Dressing, Feeding, Bathing Bathing Assistance: Maximum assistance Feeding assistance: Maximum assistance Dressing Assistance: Maximum assistance Total Care Assistance: Maximum assistance   Functional Limitations Info  Speech, Hearing, Sight Sight Info: Adequate Hearing Info: Impaired Speech Info: Impaired    SPECIAL CARE FACTORS FREQUENCY  OT (By licensed OT), PT (By licensed PT)     PT Frequency: 5x weekly OT Frequency: 5x weekly            Contractures Contractures Info: Not present    Additional Factors Info  Allergies, Code Status Code Status Info: Full code Allergies Info: No known allergies           Current Medications (10/14/2018):  This is the current hospital active medication list Current Facility-Administered Medications  Medication Dose Route Frequency Provider Last Rate Last Dose  . 0.9 %  sodium chloride infusion   Intravenous PRN SChesley Mires MD   Stopped at 10/08/18 2212  . acetaminophen (TYLENOL) tablet 500 mg  500 mg Per Tube Q6H PRN Regalado, Belkys A, MD   500 mg at 09/28/18 1839  . albuterol (PROVENTIL) (2.5 MG/3ML) 0.083% nebulizer solution 2.5 mg  2.5 mg Nebulization Q2H PRN YRush Farmer MD   2.5 mg at 09/22/18 1925  . bisacodyl (DULCOLAX) suppository 10 mg  10 mg Rectal Daily PRN SChesley Mires MD      .  carvedilol (COREG) tablet 6.25 mg  6.25 mg Per Tube BID WC Candee Furbish, MD   6.25 mg at 10/13/18 1514  . chlorhexidine gluconate (MEDLINE KIT) (PERIDEX) 0.12 % solution 15 mL  15 mL Mouth Rinse BID Rush Farmer, MD   15 mL at 10/14/18 0900  . clonazePAM (KLONOPIN) tablet 1 mg  1 mg Per Tube TID Rush Farmer, MD   1 mg at 10/13/18 2200  . docusate (COLACE) 50 MG/5ML liquid 100 mg  100 mg Per Tube BID PRN Chesley Mires, MD       . enoxaparin (LOVENOX) injection 40 mg  40 mg Subcutaneous Q24H Candee Furbish, MD   40 mg at 10/13/18 2202  . feeding supplement (JEVITY 1.2 CAL) liquid 1,000 mL  1,000 mL Per Tube Q24H Kc, Ramesh, MD 60 mL/hr at 10/14/18 0400    . feeding supplement (PRO-STAT SUGAR FREE 64) liquid 30 mL  30 mL Per Tube BID Kc, Ramesh, MD   30 mL at 10/13/18 2202  . fentaNYL (SUBLIMAZE) injection 25-100 mcg  25-100 mcg Intravenous Q30 min PRN Chesley Mires, MD   100 mcg at 16/07/37 1062  . folic acid (FOLVITE) tablet 1 mg  1 mg Per Tube Daily Chesley Mires, MD   1 mg at 10/13/18 0849  . free water 150 mL  150 mL Per Tube Q6H Magdalen Spatz, NP   150 mL at 10/14/18 0830  . guaiFENesin (ROBITUSSIN) 100 MG/5ML solution 100 mg  5 mL Per Tube Q6H Adhikari, Amrit, MD   100 mg at 10/14/18 0353  . MEDLINE mouth rinse  15 mL Mouth Rinse 10 times per day Rush Farmer, MD   15 mL at 10/14/18 0900  . multivitamin liquid 15 mL  15 mL Per Tube Daily Domenic Polite, MD   15 mL at 10/13/18 0850  . ondansetron (ZOFRAN) tablet 4 mg  4 mg Per Tube Q6H PRN Sherren Kerns D, RPH       Or  . ondansetron (ZOFRAN) injection 4 mg  4 mg Intravenous Q6H PRN Werner Lean, RPH   4 mg at 09/23/18 0000  . oxyCODONE (ROXICODONE) 5 MG/5ML solution 10 mg  10 mg Per Tube Q6H Regalado, Belkys A, MD   10 mg at 10/14/18 0353  . pantoprazole sodium (PROTONIX) 40 mg/20 mL oral suspension 40 mg  40 mg Per Tube Q24H Chesley Mires, MD   40 mg at 10/13/18 0849  . pneumococcal 23 valent vaccine (PNU-IMMUNE) injection 0.5 mL  0.5 mL Intramuscular Prior to discharge Rush Farmer, MD      . pregabalin (LYRICA) capsule 25 mg  25 mg Per Tube BID Candee Furbish, MD   25 mg at 10/13/18 2200  . QUEtiapine (SEROQUEL) tablet 25 mg  25 mg Per Tube BID Domenic Polite, MD   25 mg at 10/13/18 2201  . thiamine (VITAMIN B-1) tablet 100 mg  100 mg Per Tube Daily Chesley Mires, MD   100 mg at 10/13/18 6948     Discharge Medications: Please see discharge summary  for a list of discharge medications.  Relevant Imaging Results:  Relevant Lab Results:   Additional Information SSN: [id number]  Alberteen Sam, LCSW

## 2018-10-15 LAB — GLUCOSE, CAPILLARY
Glucose-Capillary: 111 mg/dL — ABNORMAL HIGH (ref 70–99)
Glucose-Capillary: 134 mg/dL — ABNORMAL HIGH (ref 70–99)
Glucose-Capillary: 127 mg/dL — ABNORMAL HIGH (ref 70–99)
Glucose-Capillary: 122 mg/dL — ABNORMAL HIGH (ref 70–99)

## 2018-10-15 MED ORDER — GERHARDT'S BUTT CREAM
TOPICAL_CREAM | Freq: Every day | CUTANEOUS | Status: DC | PRN
  Administered 2018-10-30 – 2018-10-31 (×2): via TOPICAL
  Filled 2018-10-15: qty 1

## 2018-10-15 NOTE — TOC Progression Note (Signed)
Transition of Care [facility]) - Progression Note    Patient Details  Name: Micheal Carey MRN: [id number] Date of Birth: 02-25-1950  Transition of Care [facility]) CM/SW [place], [place] Phone Number: [phone] 10/15/2018, 10:03 AM  Clinical Narrative:     CSW working with [facility] discharge planning rep Levada Dy at [phone]. Levada Dy is sending CSW an updated nursing facility list within 100 miles of [place] to assist in faxing out to more facilities in the hopes of identifying available long term bed options.   Expected Discharge Plan: Long Term Nursing Home Barriers to Discharge: No SNF bed  Expected Discharge Plan and Services Expected Discharge Plan: [place]     Post Acute Care Choice: Long Term Acute Care (LTAC) Living arrangements for the past 2 months: Single Family Home                                       Social Determinants of Health (SDOH) Interventions    Readmission Risk Interventions No flowsheet data found.

## 2018-10-15 NOTE — Progress Notes (Signed)
PROGRESS NOTE    Karron Alvizo  EXN:[id number] DOB: Jan 03, 1951 DOA: 09/08/2018 PCP: [name], MD   Brief Narrative:  Patient is a 68 year old male who was initially found in the parking lot with PEA arrest, unknown downtime requiring medical ventilation.  UDS was positive for opiates, alcohol level was elevated at 276.  He had prior history of COPD, alcohol abuse, chronic pain/opiate dependence.  Could not be extubated and was subsequently trached on 8/17.  Status post PEG on 8/19.  PCCM following and managing vent, sedation,  weaning trials.  Patient transfered to hospitalist team on 8/21. Patient has prolonged hospitalization. 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  Social worker following for disposition: LTAC versus SNF.  LTAC placement is difficult because of disposition.  Patient did not qualify for skilled nursing facility due to insurance issues.  SW checking long-term bed availability at a nursing facility.  Patient is medically stable for discharge as soon as placement is found.  Assessment & Plan:   Active Problems:   Pneumonia   Acute respiratory failure (HCC)   Malnutrition of moderate degree   Cardiac arrest (HCC)   Pressure injury of skin   Tracheostomy status (HCC)   Ventilator dependence ([place])   Acute lower UTI   Acute respiratory failure with hypoxia: Most likely secondary to opiate/alcohol overdose in the background of COPD.  Initially intubated on presentation.  Unable to extubate.  Trached on 8/17.  On trach collar weaning on 9/3 and tolerating.  Requires frequent suctioning for copious secretions.  Found to have wheezes on 10/07/18 ,started on  bronchodilators and short course of  Steroids which he completed.  PEA arrest with severe anoxic encephalopathy: PEA suspected secondary to overdose.  Status post trach, remains encephalopathic.  Continue supportive  care.  Evaluated by neurology earlier.  EEG/MRI  suggestive of severe anoxic brain injury.  Discussed with family about goals of care.  Family wants full aggressive scope of treatment.  Patient remains in vegetative state.  Pneumonia/fever: Completed antibiotics course with Zosyn, cefepime, ceftriaxone.  Blood cultures showed no growth till date.  Currently afebrile.  Sputum grew Acinetobacter and Citrobacter frenudii.  UTI with Enterobacter species/Citrobacter: Was treated with meropenem.  Completed antibiotics on 9/6.  Dysphagia/intermittent episodes of vomiting: Status post PEG on 8/19.  KUB did not show any ileus.  Having bowel movements now.  Diarrhea has resolved.  Nutrition following  Hyponatremia/hypokalemia: Resolved  Hypertension: Blood pressure was soft.  Norvasc on hold.  Continue carvedilol  Chronic alcohol abuse: Continue thiamine  COPD: Found to have bilateral expiratory wheezes on 10/07/18. Currently  on bronchodilators, steroids.   Pressure injuries  of heel/upper back: Continue supportive care, wound care  Goals of care: Very poor prognosis with CNS encephalopathy based on EEG/MRI.  Several discussions held with family.  Family wants full scope of treatment.  Will need SNF with vent.  Social worker/ case Freight forwarder following and working on placement.   Nutrition Problem: Moderate Malnutrition Etiology: social / environmental circumstances(Hx substance abuse)      DVT prophylaxis: Lovenox Code Status: Full Family Communication: Called son and given update on 10/08/18.He wants full scope of treatment.Will call son tomorrow again Disposition Plan: Waiting for skilled nursing facility vs LTACH.   Patient is hemodynamically stable for transfer as soon as the bed is available.  Consultants: PCCM, neurology  Procedures: EEG, MRI, intubation/extubation  Antimicrobials:  Anti-infectives (From admission, onward)  Start     Dose/Rate Route Frequency Ordered Stop   09/30/18  1400  meropenem (MERREM) 1 g in sodium chloride 0.9 % 100 mL IVPB     1 g 200 mL/hr over 30 Minutes Intravenous Every 8 hours 09/30/18 1119 10/07/18 0539   09/27/18 1200  ceFEPIme (MAXIPIME) 2 g in sodium chloride 0.9 % 100 mL IVPB  Status:  Discontinued     2 g 200 mL/hr over 30 Minutes Intravenous Every 8 hours 09/27/18 1133 09/30/18 1119   09/18/18 1330  ceFAZolin (ANCEF) IVPB 2g/100 mL premix     2 g 200 mL/hr over 30 Minutes Intravenous To Radiology 09/18/18 1323 09/18/18 1356   09/17/18 0900  ceFAZolin (ANCEF) IVPB 2g/100 mL premix     2 g 200 mL/hr over 30 Minutes Intravenous To Radiology 09/17/18 0849 09/18/18 0900   09/14/18 1000  cefTRIAXone (ROCEPHIN) 2 g in sodium chloride 0.9 % 100 mL IVPB  Status:  Discontinued     2 g 200 mL/hr over 30 Minutes Intravenous Every 24 hours 09/14/18 0919 09/16/18 1040   09/12/18 0000  vancomycin (VANCOCIN) 1,250 mg in sodium chloride 0.9 % 250 mL IVPB  Status:  Discontinued     1,250 mg 166.7 mL/hr over 90 Minutes Intravenous Every 24 hours 09/11/18 0044 09/11/18 1229   09/12/18 0000  vancomycin (VANCOCIN) IVPB 1000 mg/200 mL premix  Status:  Discontinued     1,000 mg 200 mL/hr over 60 Minutes Intravenous Every 24 hours 09/11/18 1229 09/13/18 1117   09/11/18 0800  piperacillin-tazobactam (ZOSYN) IVPB 3.375 g  Status:  Discontinued     3.375 g 12.5 mL/hr over 240 Minutes Intravenous Every 8 hours 09/11/18 0044 09/13/18 1117   09/11/18 0045  piperacillin-tazobactam (ZOSYN) IVPB 3.375 g     3.375 g 100 mL/hr over 30 Minutes Intravenous  Once 09/11/18 0040 09/11/18 0232   09/11/18 0045  vancomycin (VANCOCIN) 1,500 mg in sodium chloride 0.9 % 500 mL IVPB     1,500 mg 250 mL/hr over 120 Minutes Intravenous  Once 09/11/18 0040 09/11/18 0447      Subjective:  Patient seen and examined the bedside this morning.  Hemodynamically stable.  Comfortable.  Alert and awake.  No significant change from yesterday.  Objective: Vitals:   10/15/18 0711  10/15/18 0716 10/15/18 0801 10/15/18 1026  BP: 114/80 114/80  120/83  Pulse: 97 96 96 97  Resp: _0 Temp: 98.4 F (36.9 C)   97.6 F (36.4 C)  TempSrc: Axillary   Axillary  SpO2: 100% 100%  99%  Weight:      Height:        Intake/Output Summary (Last 24 hours) at 10/15/2018 1058 Last data filed at 10/15/2018 0300 Gross per 24 hour  Intake 1891 ml  Output 625 ml  Net 1266 ml   Filed Weights   10/13/18 0510 10/14/18 0339 10/15/18 0322  Weight: 60.9 kg 61.8 kg 62.7 kg    Examination:  General exam: Not in distress, extremely debilitated/deconditioned  HEENT: Trach collar Respiratory system: Bilateral equal air entry, normal vesicular breath sounds, no wheezes or crackles on auscultation on anterior chest Cardiovascular system: S1 & S2 heard, RRR. No JVD, murmurs, rubs, gallops or clicks. Gastrointestinal system: Abdomen is nondistended, soft and nontender. No organomegaly or masses felt. Normal bowel sounds heard.  PEG Central nervous system: Not alert or oriented.  unpurposeful movements. Moves extremities.Doesnot communicate. Responds to painful stimuli.   Extremities: No edema, no clubbing ,no  cyanosis Skin: Stage I pressure ulcer on the sacral region,deep tissue pressure injuries on the bilateral heels.    Data Reviewed: I have personally reviewed following labs and imaging studies  CBC: Recent Labs  Lab 10/11/18 0214  WBC 9.8  NEUTROABS 7.4  HGB 11.0*  HCT 34.4*  MCV 83.5  PLT 161   Basic Metabolic Panel: Recent Labs  Lab 10/11/18 0214  NA 138  K 4.2  CL 103  CO2 27  GLUCOSE 136*  BUN 24*  CREATININE 0.86  CALCIUM 9.1   GFR: Estimated Creatinine Clearance: 74.9 mL/min (by C-G formula based on SCr of 0.86 mg/dL). Liver Function Tests: No results for input(s): AST, ALT, ALKPHOS, BILITOT, PROT, ALBUMIN in the last 168 hours. No results for input(s): LIPASE, AMYLASE in the last 168 hours. No results for input(s): AMMONIA in the last 168 hours.  Coagulation Profile: No results for input(s): INR, PROTIME in the last 168 hours. Cardiac Enzymes: No results for input(s): CKTOTAL, CKMB, CKMBINDEX, TROPONINI in the last 168 hours. BNP (last 3 results) No results for input(s): PROBNP in the last 8760 hours. HbA1C: No results for input(s): HGBA1C in the last 72 hours. CBG: Recent Labs  Lab 10/14/18 1046 10/14/18 1626 10/14/18 2148 10/15/18 0623 10/15/18 1055  GLUCAP 177* 121* 116* 111* 134*   Lipid Profile: No results for input(s): CHOL, HDL, LDLCALC, TRIG, CHOLHDL, LDLDIRECT in the last 72 hours. Thyroid Function Tests: No results for input(s): TSH, T4TOTAL, FREET4, T3FREE, THYROIDAB in the last 72 hours. Anemia Panel: No results for input(s): VITAMINB12, FOLATE, FERRITIN, TIBC, IRON, RETICCTPCT in the last 72 hours. Sepsis Labs: No results for input(s): PROCALCITON, LATICACIDVEN in the last 168 hours.  Recent Results (from the past 240 hour(s))  Novel Coronavirus, NAA (hospital order; send-out to ref lab)     Status: None   Collection Time: 10/07/18  4:57 PM   Specimen: Nasopharyngeal Swab; Respiratory  Result Value Ref Range Status   SARS-CoV-2, NAA NOT DETECTED NOT DETECTED Final    Comment: (NOTE) This nucleic acid amplification test was developed and its performance characteristics determined by [employer]. Nucleic acid amplification tests include PCR and TMA. This test has not been FDA cleared or approved. This test has been authorized by FDA under an Emergency Use Authorization (EUA). This test is only authorized for the duration of time the declaration that circumstances exist justifying the authorization of the emergency use of in vitro diagnostic tests for detection of SARS-CoV-2 virus and/or diagnosis of COVID-19 infection under section 564(b)(1) of the Act, 21 U.S.C. 096EAV-4(U) (1), unless the authorization is terminated or revoked sooner. When diagnostic testing is negative, the possibility of a  false negative result should be considered in the context of a patient's recent exposures and the presence of clinical signs and symptoms consistent with COVID-19. An individual without symptoms of COVID- 19 and who is not shedding SARS-CoV-2 vi rus would expect to have a negative (not detected) result in this assay. Performed At: [facility] [address] [place], [place] [id number] Rush Farmer MD GN:[id number]    [place]  Final    Comment: Performed at [facility], [place] [address], [place], [place] [address]         Radiology Studies: No results found.      Scheduled Meds: . carvedilol  6.25 mg Per Tube BID WC  . chlorhexidine gluconate (MEDLINE KIT)  15 mL Mouth Rinse BID  . clonazePAM  1 mg Per Tube TID  .  enoxaparin (LOVENOX) injection  40 mg Subcutaneous Q24H  . feeding supplement (JEVITY 1.2 CAL)  1,000 mL Per Tube Q24H  . feeding supplement (PRO-STAT SUGAR FREE 64)  30 mL Per Tube BID  . folic acid  1 mg Per Tube Daily  . free water  150 mL Per Tube Q6H  . guaiFENesin  5 mL Per Tube Q6H  . mouth rinse  15 mL Mouth Rinse 10 times per day  . multivitamin  15 mL Per Tube Daily  . nutrition supplement (JUVEN)  1 packet Per Tube BID BM  . oxyCODONE  10 mg Per Tube Q6H  . pantoprazole sodium  40 mg Per Tube Q24H  . pregabalin  25 mg Per Tube BID  . QUEtiapine  25 mg Per Tube BID  . thiamine  100 mg Per Tube Daily   Continuous Infusions: . sodium chloride Stopped (10/08/18 2212)     LOS: 37 days    Time spent: 35 mins.    Shelly Coss, MD [facility] Pager [phone]  If 7PM-7AM, please contact night-coverage [email or web address] Password [facility] 10/15/2018, 10:58 AM

## 2018-10-15 NOTE — Plan of Care (Signed)

## 2018-10-16 LAB — GLUCOSE, CAPILLARY
Glucose-Capillary: 87 mg/dL (ref 70–99)
Glucose-Capillary: 121 mg/dL — ABNORMAL HIGH (ref 70–99)
Glucose-Capillary: 130 mg/dL — ABNORMAL HIGH (ref 70–99)
Glucose-Capillary: 111 mg/dL — ABNORMAL HIGH (ref 70–99)

## 2018-10-16 NOTE — Progress Notes (Signed)
PROGRESS NOTE    Micheal Carey  ZOX:[id number] DOB: [birth year] DOA: 09/08/2018 PCP: [name], MD   Brief Narrative:  Patient is a 68 year old male who was initially found in the parking lot with PEA arrest, unknown downtime requiring medical ventilation.  UDS was positive for opiates, alcohol level was elevated at 276.  He had prior history of COPD, alcohol abuse, chronic pain/opiate dependence.  Could not be extubated and was subsequently trached on 8/17.  Status post PEG on 8/19.  PCCM following and managing vent, sedation,  weaning trials.  Patient transfered to hospitalist team on 8/21. Patient has prolonged hospitalization. 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  Social worker following for disposition: LTAC versus SNF.  LTAC placement is difficult because of disposition problem after he finishes LTAC stay.  Patient did not qualify for skilled nursing facility due to insurance issues.  SW checking long-term bed availability at a nursing facility.  Patient is medically stable for discharge as soon as placement is found.  Assessment & Plan:   Active Problems:   Pneumonia   Acute respiratory failure (HCC)   Malnutrition of moderate degree   Cardiac arrest (HCC)   Pressure injury of skin   Tracheostomy status (HCC)   Ventilator dependence ([place])   Acute lower UTI   Acute respiratory failure with hypoxia: Most likely secondary to opiate/alcohol overdose in the background of COPD.  Initially intubated on presentation.  Unable to extubate.  Trached on 8/17.  On trach collar weaning on 9/3 and tolerating.  Requires frequent suctioning for copious secretions.  Found to have wheezes on 10/07/18 ,started on  bronchodilators and short course of  steroids which he completed.  PEA arrest with severe anoxic encephalopathy: PEA suspected secondary to overdose.  Status post trach, remains  encephalopathic.  Continue supportive care.  Evaluated by neurology earlier.  EEG/MRI  suggestive of severe anoxic brain injury.  Discussed with family about goals of care.  Family wants full aggressive scope of treatment.  Patient remains in vegetative state.  Pneumonia/fever: Completed different  antibiotics course with Zosyn, cefepime, ceftriaxone.  Blood cultures showed no growth .  Currently afebrile.  Sputum grew Acinetobacter and Citrobacter frenudii.Currentl;y stable.  UTI with Enterobacter species/Citrobacter: Was treated with meropenem.  Completed antibiotics on 9/6.  Dysphagia/intermittent episodes of vomiting: Status post PEG on 8/19.  KUB did not show any ileus.  Having bowel movements .  Diarrhea has resolved.  Nutrition following  Hyponatremia/hypokalemia: Resolved  Hypertension: Blood pressure was has been stable.  Continue current medications  Chronic alcohol abuse: Continue thiamine and folic acid  COPD: Found to have bilateral expiratory wheezes on 10/07/18.Started on steroids for which he completed the course.  Continue bronchodilators as needed.  Pressure injuries  of heel/upper back: Continue supportive care, wound care  Goals of care: Very poor prognosis with CNS encephalopathy based on EEG/MRI.  Several discussions held with family.  Family wants full scope of treatment.  Will need SNF with trach /vent support.  Social worker/ case Freight forwarder following and working on placement.   Nutrition Problem: Moderate Malnutrition Etiology: social / environmental circumstances(Hx substance abuse)      DVT prophylaxis: Lovenox Code Status: Full Family Communication: Called son and given update on 10/16/18 Disposition Plan: Waiting for skilled nursing facility vs LTACH.   Patient is hemodynamically stable for transfer as soon as the bed is available.  Consultants: PCCM, neurology  Procedures: EEG, MRI,  intubation/extubation  Antimicrobials:  Anti-infectives (From admission,  onward)   Start     Dose/Rate Route Frequency Ordered Stop   09/30/18 1400  meropenem (MERREM) 1 g in sodium chloride 0.9 % 100 mL IVPB     1 g 200 mL/hr over 30 Minutes Intravenous Every 8 hours 09/30/18 1119 10/07/18 0539   09/27/18 1200  ceFEPIme (MAXIPIME) 2 g in sodium chloride 0.9 % 100 mL IVPB  Status:  Discontinued     2 g 200 mL/hr over 30 Minutes Intravenous Every 8 hours 09/27/18 1133 09/30/18 1119   09/18/18 1330  ceFAZolin (ANCEF) IVPB 2g/100 mL premix     2 g 200 mL/hr over 30 Minutes Intravenous To Radiology 09/18/18 1323 09/18/18 1356   09/17/18 0900  ceFAZolin (ANCEF) IVPB 2g/100 mL premix     2 g 200 mL/hr over 30 Minutes Intravenous To Radiology 09/17/18 0849 09/18/18 0900   09/14/18 1000  cefTRIAXone (ROCEPHIN) 2 g in sodium chloride 0.9 % 100 mL IVPB  Status:  Discontinued     2 g 200 mL/hr over 30 Minutes Intravenous Every 24 hours 09/14/18 0919 09/16/18 1040   09/12/18 0000  vancomycin (VANCOCIN) 1,250 mg in sodium chloride 0.9 % 250 mL IVPB  Status:  Discontinued     1,250 mg 166.7 mL/hr over 90 Minutes Intravenous Every 24 hours 09/11/18 0044 09/11/18 1229   09/12/18 0000  vancomycin (VANCOCIN) IVPB 1000 mg/200 mL premix  Status:  Discontinued     1,000 mg 200 mL/hr over 60 Minutes Intravenous Every 24 hours 09/11/18 1229 09/13/18 1117   09/11/18 0800  piperacillin-tazobactam (ZOSYN) IVPB 3.375 g  Status:  Discontinued     3.375 g 12.5 mL/hr over 240 Minutes Intravenous Every 8 hours 09/11/18 0044 09/13/18 1117   09/11/18 0045  piperacillin-tazobactam (ZOSYN) IVPB 3.375 g     3.375 g 100 mL/hr over 30 Minutes Intravenous  Once 09/11/18 0040 09/11/18 0232   09/11/18 0045  vancomycin (VANCOCIN) 1,500 mg in sodium chloride 0.9 % 500 mL IVPB     1,500 mg 250 mL/hr over 120 Minutes Intravenous  Once 09/11/18 0040 09/11/18 0447      Subjective:  Patient seen and examined the bedside this morning.  Currently hemodynamically stable.  Was have a lot of secretions  through his trach.  Otherwise there is no significant change from yesterday.  Respiratory status is stable.  Objective: Vitals:   10/16/18 0718 10/16/18 0847 10/16/18 1000 10/16/18 1035  BP: 132/75 129/76 140/88 (!) 144/78  Pulse: 99 95 98 100  Resp: '20 15 18 16  ' Temp: 98.7 F (37.1 C)   98 F (36.7 C)  TempSrc: Oral   Axillary  SpO2: 98% 96% 96% 97%  Weight:      Height:        Intake/Output Summary (Last 24 hours) at 10/16/2018 1108 Last data filed at 10/16/2018 0324 Gross per 24 hour  Intake 1849 ml  Output 1325 ml  Net 524 ml   Filed Weights   10/14/18 0339 10/15/18 0322 10/16/18 0535  Weight: 61.8 kg 62.7 kg 61.8 kg    Examination:   General exam: Not in distress, extremity related to/deconditioned, persistent encephalopathy, vegetative state  HEENT: Trach collar Respiratory system: Bilateral equal air entry, no wheezes or crackles auscultated on anterior chest examination Cardiovascular system: S1 & S2 heard, RRR. No JVD, murmurs, rubs, gallops or clicks. Gastrointestinal system: Abdomen is nondistended, soft and nontender. No organomegaly or masses felt. Normal bowel sounds heard.  PEG Central nervous system: Not alert or oriented, on purposeful movements.  Moves extremities.  Responds to painful stimuli  extremities: No edema, no clubbing ,no cyanosis Skin: Stage I pressure ulcer on the sacral region,deep tissue pressure injuries on the bilateral heels.  Data Reviewed: I have personally reviewed following labs and imaging studies  CBC: Recent Labs  Lab 10/11/18 0214  WBC 9.8  NEUTROABS 7.4  HGB 11.0*  HCT 34.4*  MCV 83.5  PLT 009   Basic Metabolic Panel: Recent Labs  Lab 10/11/18 0214  NA 138  K 4.2  CL 103  CO2 27  GLUCOSE 136*  BUN 24*  CREATININE 0.86  CALCIUM 9.1   GFR: Estimated Creatinine Clearance: 73.9 mL/min (by C-G formula based on SCr of 0.86 mg/dL). Liver Function Tests: No results for input(s): AST, ALT, ALKPHOS, BILITOT, PROT,  ALBUMIN in the last 168 hours. No results for input(s): LIPASE, AMYLASE in the last 168 hours. No results for input(s): AMMONIA in the last 168 hours. Coagulation Profile: No results for input(s): INR, PROTIME in the last 168 hours. Cardiac Enzymes: No results for input(s): CKTOTAL, CKMB, CKMBINDEX, TROPONINI in the last 168 hours. BNP (last 3 results) No results for input(s): PROBNP in the last 8760 hours. HbA1C: No results for input(s): HGBA1C in the last 72 hours. CBG: Recent Labs  Lab 10/15/18 1055 10/15/18 1605 10/15/18 2106 10/16/18 0558 10/16/18 1047  GLUCAP 134* 127* 122* 87 121*   Lipid Profile: No results for input(s): CHOL, HDL, LDLCALC, TRIG, CHOLHDL, LDLDIRECT in the last 72 hours. Thyroid Function Tests: No results for input(s): TSH, T4TOTAL, FREET4, T3FREE, THYROIDAB in the last 72 hours. Anemia Panel: No results for input(s): VITAMINB12, FOLATE, FERRITIN, TIBC, IRON, RETICCTPCT in the last 72 hours. Sepsis Labs: No results for input(s): PROCALCITON, LATICACIDVEN in the last 168 hours.  Recent Results (from the past 240 hour(s))  Novel Coronavirus, NAA (hospital order; send-out to ref lab)     Status: None   Collection Time: 10/07/18  4:57 PM   Specimen: Nasopharyngeal Swab; Respiratory  Result Value Ref Range Status   SARS-CoV-2, NAA NOT DETECTED NOT DETECTED Final    Comment: (NOTE) This nucleic acid amplification test was developed and its performance characteristics determined by [employer]. Nucleic acid amplification tests include PCR and TMA. This test has not been FDA cleared or approved. This test has been authorized by FDA under an Emergency Use Authorization (EUA). This test is only authorized for the duration of time the declaration that circumstances exist justifying the authorization of the emergency use of in vitro diagnostic tests for detection of SARS-CoV-2 virus and/or diagnosis of COVID-19 infection under section 564(b)(1) of the  Act, 21 U.S.C. 233AQT-6(A) (1), unless the authorization is terminated or revoked sooner. When diagnostic testing is negative, the possibility of a false negative result should be considered in the context of a patient's recent exposures and the presence of clinical signs and symptoms consistent with COVID-19. An individual without symptoms of COVID- 19 and who is not shedding SARS-CoV-2 vi rus would expect to have a negative (not detected) result in this assay. Performed At: [facility] [address] [place], [place] [id number] Rush Farmer MD YB:[id number]    [place]  Final    Comment: Performed at [facility], [place] [address] [place], [place] [address]         Radiology Studies: No results found.      Scheduled Meds:  carvedilol  6.25 mg Per  Tube BID WC   chlorhexidine gluconate (MEDLINE KIT)  15 mL Mouth Rinse BID   clonazePAM  1 mg Per Tube TID   enoxaparin (LOVENOX) injection  40 mg Subcutaneous Q24H   feeding supplement (JEVITY 1.2 CAL)  1,000 mL Per Tube Q24H   feeding supplement (PRO-STAT SUGAR FREE 64)  30 mL Per Tube BID   folic acid  1 mg Per Tube Daily   free water  150 mL Per Tube Q6H   guaiFENesin  5 mL Per Tube Q6H   mouth rinse  15 mL Mouth Rinse 10 times per day   multivitamin  15 mL Per Tube Daily   nutrition supplement (JUVEN)  1 packet Per Tube BID BM   oxyCODONE  10 mg Per Tube Q6H   pantoprazole sodium  40 mg Per Tube Q24H   pregabalin  25 mg Per Tube BID   QUEtiapine  25 mg Per Tube BID   thiamine  100 mg Per Tube Daily   Continuous Infusions:  sodium chloride Stopped (10/08/18 2212)     LOS: 38 days    Time spent: 35 mins.    Shelly Coss, MD [facility] Pager [phone]  If 7PM-7AM, please contact night-coverage [email or web address] Password [email or web address] 10/16/2018, 11:08 AM

## 2018-10-16 NOTE — Care Management Important Message (Signed)
Important Message  Patient Details  Name: Micheal Carey MRN: [id number] Date of Birth: 12/05/1950   Medicare Important Message Given:  Yes     Memory Argue 10/16/2018, 3:21 PM

## 2018-10-17 LAB — BASIC METABOLIC PANEL
Anion gap: 8 (ref 5–15)
BUN: 25 mg/dL — ABNORMAL HIGH (ref 8–23)
CO2: 25 mmol/L (ref 22–32)
Calcium: 9.2 mg/dL (ref 8.9–10.3)
Chloride: 105 mmol/L (ref 98–111)
Creatinine, Ser: 0.78 mg/dL (ref 0.61–1.24)
GFR calc Af Amer: >60 mL/min (ref >60)
GFR calc non Af Amer: >60 mL/min (ref >60)
Glucose, Bld: 123 mg/dL — ABNORMAL HIGH (ref 70–99)
Potassium: 3.9 mmol/L (ref 3.5–5.1)
Sodium: 138 mmol/L (ref 135–145)

## 2018-10-17 LAB — GLUCOSE, CAPILLARY
Glucose-Capillary: 93 mg/dL (ref 70–99)
Glucose-Capillary: 111 mg/dL — ABNORMAL HIGH (ref 70–99)

## 2018-10-17 LAB — CBC WITH DIFFERENTIAL/PLATELET
Abs Immature Granulocytes: 0.02 10*3/uL (ref 0.00–0.07)
Basophils Absolute: 0 10*3/uL (ref 0.0–0.1)
Basophils Relative: 1 %
Eosinophils Absolute: 0.5 10*3/uL (ref 0.0–0.5)
Eosinophils Relative: 8 %
HCT: 34.6 % — ABNORMAL LOW (ref 39.0–52.0)
Hemoglobin: 11.8 g/dL — ABNORMAL LOW (ref 13.0–17.0)
Immature Granulocytes: 0 %
Lymphocytes Relative: 22 %
Lymphs Abs: 1.4 10*3/uL (ref 0.7–4.0)
MCH: 28.1 pg (ref 26.0–34.0)
MCHC: 34.1 g/dL (ref 30.0–36.0)
MCV: 82.4 fL (ref 80.0–100.0)
Monocytes Absolute: 0.8 10*3/uL (ref 0.1–1.0)
Monocytes Relative: 13 %
Neutro Abs: 3.5 10*3/uL (ref 1.7–7.7)
Neutrophils Relative %: 56 %
Platelets: 218 10*3/uL (ref 150–400)
RBC: 4.2 MIL/uL — ABNORMAL LOW (ref 4.22–5.81)
RDW: 13.6 % (ref 11.5–15.5)
WBC: 6.2 10*3/uL (ref 4.0–10.5)
nRBC: 0 % (ref 0.0–0.2)

## 2018-10-17 MED ORDER — OXYCODONE HCL 5 MG/5ML PO SOLN
5.0000 mg | Freq: Four times a day (QID) | ORAL | Status: DC
  Administered 2018-10-17 – 2018-10-26 (×36): 5 mg
  Filled 2018-10-17 (×36): qty 5

## 2018-10-17 MED ORDER — QUETIAPINE FUMARATE 50 MG PO TABS
25.0000 mg | ORAL_TABLET | Freq: Every day | ORAL | Status: DC
  Administered 2018-10-18 – 2018-11-08 (×22): 25 mg
  Filled 2018-10-17 (×22): qty 1

## 2018-10-17 MED ORDER — JEVITY 1.2 CAL PO LIQD
1000.0000 mL | ORAL | Status: DC
  Administered 2018-10-18 – 2018-11-05 (×15): 1000 mL
  Administered 2018-11-08: 60 mL/h
  Administered 2018-11-09: 07:00:00 1000 mL
  Filled 2018-10-17 (×37): qty 1000

## 2018-10-17 MED ORDER — CLONAZEPAM 0.5 MG PO TABS
0.5000 mg | ORAL_TABLET | Freq: Three times a day (TID) | ORAL | Status: DC
  Administered 2018-10-17 – 2018-11-09 (×69): 0.5 mg
  Filled 2018-10-17 (×68): qty 1

## 2018-10-17 MED ORDER — WHITE PETROLATUM EX OINT
TOPICAL_OINTMENT | CUTANEOUS | Status: AC
  Administered 2018-10-17: 0.2
  Filled 2018-10-17: qty 28.35

## 2018-10-17 NOTE — Progress Notes (Signed)
Nutrition Follow-up  DOCUMENTATION CODES:   Non-severe (moderate) malnutrition in context of social or environmental circumstances  INTERVENTION:   Continue tube feeds via PEG: - Jevity 1.2 @ 60 ml/hr - Pro-stat 30 ml BID  Tube feeding regimen provides 1928 kcal, 110 grams of protein, and 1166 ml of H2O.   - Continue free water flushes of 150 ml QID  - 1 packet Juven BID per tube, each packet provides 95 calories, 2.5 grams of protein, and 9.8 grams of carbohydrate; also contains L-arginine and L-glutamine, vitamin C, vitamin E, vitamin B-12, zinc, calcium, and calcium Beta-hydroxy-Beta-methylbutyrate to support wound healing  NUTRITION DIAGNOSIS:   Moderate Malnutrition related to social / environmental circumstances(Hx substance abuse) as evidenced by moderate muscle depletion, moderate fat depletion.  Ongoing, being addressed via TF  GOAL:   Patient will meet greater than or equal to 90% of their needs  Met via TF  MONITOR:   Labs, Weight trends, TF tolerance, Skin, I & O's  REASON FOR ASSESSMENT:   Consult Enteral/tube feeding initiation and management  ASSESSMENT:   68 yo male admitted with PEA arrest after being found down in a parking lot. Urine screen positive for opiates, ETOH positive. PMH includes alcohol and opiate abuse, CAP.  8/17 - s/p trach, Cortrak placed 8/19 - s/p PEG placement 8/20 - TF held due to vomiting 8/21 - TF restarted at trickle  Pt remains medically stable for discharge pending placement. Per LCSW note today, pt continues to have no bed offers at this time.  Noted pt is tolerating PEG tube feeding and free water flushes without issue. Spoke with RN via phone call who confirms.  Weight stable.  Current TF: Jevity 1.2 @ 60 ml/hr, Pro-stat 30 ml BID, free water 150 ml QID  Medications reviewed and include: folic acid, liquid MVI, Juven BID, Protonix, thiamine  Labs reviewed. CBG's: 93, 111 x 24 hours  UOP: 1200 ml x 24  hours  Diet Order:   Diet Order    None      EDUCATION NEEDS:   No education needs have been identified at this time  Skin:  Skin Assessment: Skin Integrity Issues: Skin Integrity Issues: DTI: right leg Stage II: left heel  Last BM:  10/17/18  Height:   Ht Readings from Last 1 Encounters:  09/08/18 _0  (1.727 m)    Weight:   Wt Readings from Last 1 Encounters:  10/17/18 61.9 kg    Ideal Body Weight:  70 kg  BMI:  Body mass index is 20.75 kg/m.  Estimated Nutritional Needs:   Kcal:  1750-1950 kcal  Protein:  105-120 grams  Fluid:  >/= 1.7 L/day    Gaynell Face, MS, RD, LDN Inpatient Clinical Dietitian Pager: [phone] Weekend/After Hours: [phone]

## 2018-10-17 NOTE — TOC Progression Note (Signed)
Transition of Care [facility]) - Progression Note    Patient Details  Name: Micheal Carey MRN: [id number] Date of Birth: [birth year]  [facility]) CM/SW [place], [place] Phone Number: 10/17/2018, 4:52 PM  Clinical Narrative:     Patient continues to have no bed offers at this time. One barrier being cuffed trach, CSW consulted with Dr. Joesph Fillers regarding potentially working towards uncuffed trach to assist in placement.   Expected Discharge Plan: Long Term Nursing Home Barriers to Discharge: No SNF bed  Expected Discharge Plan and Services Expected Discharge Plan: [place]     Post Acute Care Choice: Long Term Acute Care (LTAC) Living arrangements for the past 2 months: Single Family Home                                       Social Determinants of Health (SDOH) Interventions    Readmission Risk Interventions No flowsheet data found.

## 2018-10-17 NOTE — Progress Notes (Signed)
CMT reported that pt has ST elevation, reviewed previous telemetry,slight elevation noted. Stat EKG done ,with no elevation noted. Continue to monitor.

## 2018-10-17 NOTE — Progress Notes (Addendum)
PROGRESS NOTE    Micheal Carey  IZT:[id number] DOB: 05/28/1950 DOA: 09/08/2018 PCP: [name], MD   Brief Narrative:  Patient is a 68 year old male who was initially found in the parking lot with PEA arrest, unknown downtime requiring medical ventilation.  UDS was positive for opiates, alcohol level was elevated at 276.  He had prior history of COPD, alcohol abuse, chronic pain/opiate dependence.  Could not be extubated and was subsequently trached on 8/17.  Status post PEG on 8/19.  PCCM following and managing vent, sedation,  weaning trials.  Patient transfered to hospitalist team on 8/21. Patient has prolonged hospitalization. 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  Social worker following for disposition: LTAC versus SNF.  LTAC placement is difficult because of disposition problem after he finishes LTAC stay.  Patient did not qualify for skilled nursing facility due to insurance issues.  SW checking long-term bed availability at a nursing facility.  Patient is medically stable for discharge as soon as placement is found.  Assessment & Plan:   Active Problems:   Pneumonia   Acute respiratory failure (HCC)   Malnutrition of moderate degree   Cardiac arrest (HCC)   Pressure injury of skin   Tracheostomy status (HCC)   Ventilator dependence ([place])   Acute lower UTI   Acute respiratory failure with hypoxia: Most likely secondary to opiate/alcohol overdose in the background of COPD.  Initially intubated on presentation.    Trached on 09/16/18.  On trach collar weaning since 10/03/2018 and tolerating.  Requires frequent suctioning for copious secretions.   -PCCM to continue to help manage trach Discussed with Dr. Natasha Bence (PCCM)---  Will be easier to Place pt to Rehab if Trach is uncuffed  PEA arrest with severe anoxic encephalopathy: PEA suspected secondary to overdose.  Status post  trach, remains encephalopathic.  Continue supportive care.  Evaluated by neurology earlier.  EEG/MRI  suggestive of severe anoxic brain injury.  Discussed with family about goals of care.  Family wants full aggressive scope of treatment.  Patient remains in vegetative state.  Pneumonia/fever: Completed different  antibiotics course including Zosyn, cefepime, ceftriaxone.  Blood cultures showed no growth .  Currently afebrile.  Sputum grew Acinetobacter and Citrobacter frenudii. Currentl;y stable.  UTI with Enterobacter species/Citrobacter: Was treated with meropenem.  Completed antibiotics on 10/06/18.  Dysphagia/intermittent episodes of vomiting/FEN: Status post PEG on 09/18/18.  Having bowel movements .  Diarrhea has resolved.  Nutrition following -Continue Jevity 1.2 at 60 mL an hour -Continue water flushes at 150 mL every 6 hours  Hyponatremia/hypokalemia: Resolved  Hypertension: Blood pressure was has been stable.  Continue current medications  Chronic alcohol abuse: Continue thiamine and folic acid  COPD:-Stable after treatment with steroids, antibiotics.  continue bronchodilators as needed.  Pressure injuries  of heel/upper back: Continue supportive care, wound care  Goals of care: Very poor prognosis with CNS encephalopathy based on EEG/MRI.  Several discussions held with family.  Family wants full scope of treatment.  Will need SNF with trach /vent support.  Social worker/ case Freight forwarder following and working on placement.   Nutrition Problem: Moderate Malnutrition Etiology: social / environmental circumstances(Hx substance abuse)    DVT prophylaxis: Lovenox Code Status: Full Family Communication: Called son and given update on 10/16/18 Disposition Plan: Waiting for skilled nursing facility vs LTACH.   Patient is hemodynamically stable for transfer as soon as the bed is available. -Will be easier to Place  pt to Rehab if [name] is uncuffed  Consultants: PCCM,  neurology  Procedures: EEG, MRI, intubation/extubation Trach PEG  Antimicrobials:  Anti-infectives (From admission, onward)   Start     Dose/Rate Route Frequency Ordered Stop   09/30/18 1400  meropenem (MERREM) 1 g in sodium chloride 0.9 % 100 mL IVPB     1 g 200 mL/hr over 30 Minutes Intravenous Every 8 hours 09/30/18 1119 10/07/18 0539   09/27/18 1200  ceFEPIme (MAXIPIME) 2 g in sodium chloride 0.9 % 100 mL IVPB  Status:  Discontinued     2 g 200 mL/hr over 30 Minutes Intravenous Every 8 hours 09/27/18 1133 09/30/18 1119   09/18/18 1330  ceFAZolin (ANCEF) IVPB 2g/100 mL premix     2 g 200 mL/hr over 30 Minutes Intravenous To Radiology 09/18/18 1323 09/18/18 1356   09/17/18 0900  ceFAZolin (ANCEF) IVPB 2g/100 mL premix     2 g 200 mL/hr over 30 Minutes Intravenous To Radiology 09/17/18 0849 09/18/18 0900   09/14/18 1000  cefTRIAXone (ROCEPHIN) 2 g in sodium chloride 0.9 % 100 mL IVPB  Status:  Discontinued     2 g 200 mL/hr over 30 Minutes Intravenous Every 24 hours 09/14/18 0919 09/16/18 1040   09/12/18 0000  vancomycin (VANCOCIN) 1,250 mg in sodium chloride 0.9 % 250 mL IVPB  Status:  Discontinued     1,250 mg 166.7 mL/hr over 90 Minutes Intravenous Every 24 hours 09/11/18 0044 09/11/18 1229   09/12/18 0000  vancomycin (VANCOCIN) IVPB 1000 mg/200 mL premix  Status:  Discontinued     1,000 mg 200 mL/hr over 60 Minutes Intravenous Every 24 hours 09/11/18 1229 09/13/18 1117   09/11/18 0800  piperacillin-tazobactam (ZOSYN) IVPB 3.375 g  Status:  Discontinued     3.375 g 12.5 mL/hr over 240 Minutes Intravenous Every 8 hours 09/11/18 0044 09/13/18 1117   09/11/18 0045  piperacillin-tazobactam (ZOSYN) IVPB 3.375 g     3.375 g 100 mL/hr over 30 Minutes Intravenous  Once 09/11/18 0040 09/11/18 0232   09/11/18 0045  vancomycin (VANCOCIN) 1,500 mg in sodium chloride 0.9 % 500 mL IVPB     1,500 mg 250 mL/hr over 120 Minutes Intravenous  Once 09/11/18 0040 09/11/18 0447      Subjective:  RN at bedside, no new concerns, tolerating PEG tube feeding water flushes well.  Objective: Vitals:   10/17/18 0715 10/17/18 0843 10/17/18 1102 10/17/18 1224  BP: 97/63 102/64 (!) 101/58 106/65  Pulse: 85 89 94 90  Resp: 20 16 (!) 24 (!) 26  Temp: 98.8 F (37.1 C)  98.6 F (37 C)   TempSrc: Axillary  Axillary   SpO2: 96% 96% 96% 96%  Weight:      Height:        Intake/Output Summary (Last 24 hours) at 10/17/2018 1405 Last data filed at 10/17/2018 1200 Gross per 24 hour  Intake 1500 ml  Output 875 ml  Net 625 ml   Filed Weights   10/15/18 0322 10/16/18 0535 10/17/18 0337  Weight: 62.7 kg 61.8 kg 61.9 kg    Examination: General exam: Not in distress, deconditioned, persistent encephalopathy, vegetative state  HEENT: Trach collar Respiratory system: Bilateral equal air entry, no wheezes or crackles auscultated on anterior chest examination Cardiovascular system: S1 & S2 heard, RRR. No JVD, murmurs, rubs, gallops or clicks. Gastrointestinal system: Abdomen is nondistended, soft and nontender. No organomegaly or masses felt. Normal bowel sounds heard.  PEG in situ Central nervous system: Not alert or  oriented, on purposeful movements.  Moves extremities.  Responds to painful stimuli  extremities: No edema, no clubbing ,no cyanosis Skin: Stage I pressure ulcer on the sacral region,deep tissue pressure injuries on the bilateral heels.  Data Reviewed:   CBC: Recent Labs  Lab 10/11/18 0214 10/17/18 0240  WBC 9.8 6.2  NEUTROABS 7.4 3.5  HGB 11.0* 11.8*  HCT 34.4* 34.6*  MCV 83.5 82.4  PLT 315 944   Basic Metabolic Panel: Recent Labs  Lab 10/11/18 0214 10/17/18 0240  NA 138 138  K 4.2 3.9  CL 103 105  CO2 27 25  GLUCOSE 136* 123*  BUN 24* 25*  CREATININE 0.86 0.78  CALCIUM 9.1 9.2   GFR: Estimated Creatinine Clearance: 79.5 mL/min (by C-G formula based on SCr of 0.78 mg/dL). Liver Function Tests: No results for input(s): AST, ALT, ALKPHOS,  BILITOT, PROT, ALBUMIN in the last 168 hours. No results for input(s): LIPASE, AMYLASE in the last 168 hours. No results for input(s): AMMONIA in the last 168 hours. Coagulation Profile: No results for input(s): INR, PROTIME in the last 168 hours. Cardiac Enzymes: No results for input(s): CKTOTAL, CKMB, CKMBINDEX, TROPONINI in the last 168 hours. BNP (last 3 results) No results for input(s): PROBNP in the last 8760 hours. HbA1C: No results for input(s): HGBA1C in the last 72 hours. CBG: Recent Labs  Lab 10/16/18 0558 10/16/18 1047 10/16/18 1557 10/16/18 2124 10/17/18 0605  GLUCAP 87 121* 130* 111* 93   Lipid Profile: No results for input(s): CHOL, HDL, LDLCALC, TRIG, CHOLHDL, LDLDIRECT in the last 72 hours. Thyroid Function Tests: No results for input(s): TSH, T4TOTAL, FREET4, T3FREE, THYROIDAB in the last 72 hours. Anemia Panel: No results for input(s): VITAMINB12, FOLATE, FERRITIN, TIBC, IRON, RETICCTPCT in the last 72 hours. Sepsis Labs: No results for input(s): PROCALCITON, LATICACIDVEN in the last 168 hours.  Recent Results (from the past 240 hour(s))  Novel Coronavirus, NAA (hospital order; send-out to ref lab)     Status: None   Collection Time: 10/07/18  4:57 PM   Specimen: Nasopharyngeal Swab; Respiratory  Result Value Ref Range Status   SARS-CoV-2, NAA NOT DETECTED NOT DETECTED Final    Comment: (NOTE) This nucleic acid amplification test was developed and its performance characteristics determined by [employer]. Nucleic acid amplification tests include PCR and TMA. This test has not been FDA cleared or approved. This test has been authorized by FDA under an Emergency Use Authorization (EUA). This test is only authorized for the duration of time the declaration that circumstances exist justifying the authorization of the emergency use of in vitro diagnostic tests for detection of SARS-CoV-2 virus and/or diagnosis of COVID-19 infection under section  564(b)(1) of the Act, 21 U.S.C. 967RFF-6(B) (1), unless the authorization is terminated or revoked sooner. When diagnostic testing is negative, the possibility of a false negative result should be considered in the context of a patient's recent exposures and the presence of clinical signs and symptoms consistent with COVID-19. An individual without symptoms of COVID- 19 and who is not shedding SARS-CoV-2 vi rus would expect to have a negative (not detected) result in this assay. Performed At: [facility] [address] [place], [place] [id number] Rush Farmer MD TS:[id number]    [place]  Final    Comment: Performed at [facility], [place] [address] [place], [place] [address]     Radiology Studies: No results found.   Scheduled Meds:  carvedilol  6.25 mg Per Tube BID WC  chlorhexidine gluconate (MEDLINE KIT)  15 mL Mouth Rinse BID   clonazePAM  1 mg Per Tube TID   enoxaparin (LOVENOX) injection  40 mg Subcutaneous Q24H   feeding supplement (JEVITY 1.2 CAL)  1,000 mL Per Tube Q24H   feeding supplement (PRO-STAT SUGAR FREE 64)  30 mL Per Tube BID   folic acid  1 mg Per Tube Daily   free water  150 mL Per Tube Q6H   guaiFENesin  5 mL Per Tube Q6H   mouth rinse  15 mL Mouth Rinse 10 times per day   multivitamin  15 mL Per Tube Daily   nutrition supplement (JUVEN)  1 packet Per Tube BID BM   oxyCODONE  10 mg Per Tube Q6H   pantoprazole sodium  40 mg Per Tube Q24H   pregabalin  25 mg Per Tube BID   QUEtiapine  25 mg Per Tube BID   thiamine  100 mg Per Tube Daily   Continuous Infusions:  sodium chloride Stopped (10/08/18 2212)     LOS: 39 days     Roxan Hockey, MD [facility]  If 7PM-7AM, please contact night-coverage [email or web address] Password [email or web address] 10/17/2018, 2:05 PM

## 2018-10-18 LAB — GLUCOSE, CAPILLARY: Glucose-Capillary: 123 mg/dL — ABNORMAL HIGH (ref 70–99)

## 2018-10-18 MED ORDER — LORAZEPAM 2 MG/ML IJ SOLN
0.5000 mg | Freq: Four times a day (QID) | INTRAMUSCULAR | Status: DC | PRN
  Administered 2018-10-18 – 2018-11-06 (×10): 0.5 mg via INTRAVENOUS
  Filled 2018-10-18 (×11): qty 1

## 2018-10-18 NOTE — Progress Notes (Signed)
Congested , oral suction done. But vomited to mod amount of yellowish output.. Hob at 30 degree at all times. Tube feeding held for 2 hours.  Continue to monitor.

## 2018-10-18 NOTE — Care Management Important Message (Signed)
Important Message  Patient Details  Name: Micheal Carey MRN: [id number] Date of Birth: [birth year]   Medicare Important Message Given:  Yes     Memory Argue 10/18/2018, 4:20 PM

## 2018-10-18 NOTE — Progress Notes (Signed)
PROGRESS NOTE    Micheal Carey  HKV:[id number] DOB: Jun 12, 1950 DOA: 09/08/2018 PCP: [name], MD   Brief Narrative:  Patient is a 68 year old male who was initially found in the parking lot with PEA arrest, unknown downtime requiring medical ventilation.  UDS was positive for opiates, alcohol level was elevated at 276.  He had prior history of COPD, alcohol abuse, chronic pain/opiate dependence.  Could not be extubated and was subsequently trached on 8/17.  Status post PEG on 8/19.  PCCM following and managing vent, sedation,  weaning trials.  Patient transfered to hospitalist team on 8/21. Patient has prolonged hospitalization. 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  Social worker following for disposition: LTAC versus SNF.  LTAC placement is difficult because of disposition problem after he finishes LTAC stay.  Patient did not qualify for skilled nursing facility due to insurance issues.  SW checking long-term bed availability at a nursing facility.  Patient is medically stable for discharge as soon as placement is found.  Assessment & Plan:   Active Problems:   Pneumonia   Acute respiratory failure (HCC)   Malnutrition of moderate degree   Cardiac arrest (HCC)   Pressure injury of skin   Tracheostomy status (HCC)   Ventilator dependence ([place])   Acute lower UTI  Acute respiratory failure with hypoxia: Most likely secondary to opiate/alcohol overdose in the background of COPD.  Initially intubated on presentation.    Trached on 09/16/18.  On trach collar weaning since 10/03/2018 and tolerating.  -Discussed with Dr. Natasha Bence (PCCM)---  Will be easier to Place pt to Rehab if [name] is uncuffed, RT will switch to cuffed trach on 10/19/2018  PEA Arrest with severe Anoxic Encephalopathy: PEA suspected secondary to overdose.  Status post trach, remains encephalopathic.  Continue supportive  care.  Evaluated by neurology earlier.  EEG/MRI  suggestive of severe anoxic brain injury.  Discussed with family about goals of care.  Family wants full aggressive scope of treatment.  Patient remains in vegetative state.  Pneumonia: Completed different  antibiotics course including Zosyn, cefepime, ceftriaxone.  Blood cultures showed no growth .  Currently afebrile.  Sputum grew Acinetobacter and Citrobacter frenudii. Currentl;y stable.  UTI with Enterobacter species/Citrobacter: Was treated with meropenem.  Completed antibiotics on 10/06/18.  Dysphagia/intermittent episodes of vomiting/FEN: Status post PEG on 09/18/18.  Having bowel movements .  Diarrhea has resolved.  Nutrition following -Continue Jevity 1.2 at 60 mL an hour -Continue water flushes at 150 mL every 6 hours  Hyponatremia/hypokalemia: Resolved  Hypertension: Blood pressure was has been stable.  Continue current medications  Chronic alcohol abuse: Continue thiamine and folic acid  COPD:-Stable after treatment with steroids, antibiotics.  continue bronchodilators as needed.  Pressure injuries  of heel/upper back: Continue supportive care, wound care  Goals of care: Very poor prognosis with CNS encephalopathy based on EEG/MRI.  Several discussions held with family.  Family wants full scope of treatment.  Will need SNF with trach /vent support.  Social worker/ case Freight forwarder following and working on placement.  Nutrition Problem: Moderate Malnutrition Etiology: social / environmental circumstances(Hx substance abuse)  DVT prophylaxis: Lovenox Code Status: Full Family Communication:  Called son and given update on 10/18/18--  [name] at [phone] (lives in [place], [place]) and [place] (Daughter at [phone] Lives in [place], [place]  Disposition Plan: Waiting for skilled nursing facility vs LTACH.   Patient is hemodynamically stable for transfer as  soon as the bed is available. -Will be easier to Place pt to  Rehab if [name] is uncuffed  Consultants: PCCM, neurology  Procedures: EEG, MRI, intubation/extubation Trach PEG  Antimicrobials:  Anti-infectives (From admission, onward)   Start     Dose/Rate Route Frequency Ordered Stop   09/30/18 1400  meropenem (MERREM) 1 g in sodium chloride 0.9 % 100 mL IVPB     1 g 200 mL/hr over 30 Minutes Intravenous Every 8 hours 09/30/18 1119 10/07/18 0539   09/27/18 1200  ceFEPIme (MAXIPIME) 2 g in sodium chloride 0.9 % 100 mL IVPB  Status:  Discontinued     2 g 200 mL/hr over 30 Minutes Intravenous Every 8 hours 09/27/18 1133 09/30/18 1119   09/18/18 1330  ceFAZolin (ANCEF) IVPB 2g/100 mL premix     2 g 200 mL/hr over 30 Minutes Intravenous To Radiology 09/18/18 1323 09/18/18 1356   09/17/18 0900  ceFAZolin (ANCEF) IVPB 2g/100 mL premix     2 g 200 mL/hr over 30 Minutes Intravenous To Radiology 09/17/18 0849 09/18/18 0900   09/14/18 1000  cefTRIAXone (ROCEPHIN) 2 g in sodium chloride 0.9 % 100 mL IVPB  Status:  Discontinued     2 g 200 mL/hr over 30 Minutes Intravenous Every 24 hours 09/14/18 0919 09/16/18 1040   09/12/18 0000  vancomycin (VANCOCIN) 1,250 mg in sodium chloride 0.9 % 250 mL IVPB  Status:  Discontinued     1,250 mg 166.7 mL/hr over 90 Minutes Intravenous Every 24 hours 09/11/18 0044 09/11/18 1229   09/12/18 0000  vancomycin (VANCOCIN) IVPB 1000 mg/200 mL premix  Status:  Discontinued     1,000 mg 200 mL/hr over 60 Minutes Intravenous Every 24 hours 09/11/18 1229 09/13/18 1117   09/11/18 0800  piperacillin-tazobactam (ZOSYN) IVPB 3.375 g  Status:  Discontinued     3.375 g 12.5 mL/hr over 240 Minutes Intravenous Every 8 hours 09/11/18 0044 09/13/18 1117   09/11/18 0045  piperacillin-tazobactam (ZOSYN) IVPB 3.375 g     3.375 g 100 mL/hr over 30 Minutes Intravenous  Once 09/11/18 0040 09/11/18 0232   09/11/18 0045  vancomycin (VANCOCIN) 1,500 mg in sodium chloride 0.9 % 500 mL IVPB     1,500 mg 250 mL/hr over 120 Minutes Intravenous   Once 09/11/18 0040 09/11/18 0447     Subjective:  Patient had episode of emesis, tube feeding was held for couple of hours, head of bed elevated, -Continues to have secretions  mostly clear from trach  Objective: Vitals:   10/18/18 1115 10/18/18 1200 10/18/18 1209 10/18/18 1508  BP:    135/85  Pulse:   94 (!) 105  Resp: 20  18   Temp: 98.7 F (37.1 C) 98.2 F (36.8 C)    TempSrc: Axillary Oral    SpO2:   98%   Weight:      Height:        Intake/Output Summary (Last 24 hours) at 10/18/2018 1511 Last data filed at 10/18/2018 1400 Gross per 24 hour  Intake 660 ml  Output 1100 ml  Net -440 ml   Filed Weights   10/16/18 0535 10/17/18 0337 10/18/18 0537  Weight: 61.8 kg 61.9 kg 64.4 kg    Examination: General exam: Not in distress, deconditioned, persistent encephalopathy, vegetative state  HEENT: Trach collar Respiratory system: Bilateral equal air entry, no wheezes or crackles auscultated on anterior chest examination Cardiovascular system: S1 & S2 heard, RRR. No JVD, murmurs, rubs, gallops or clicks. Gastrointestinal system: Abdomen is nondistended,  soft and nontender. No organomegaly or masses felt. Normal bowel sounds heard.  PEG in situ Central nervous system: Not alert or oriented,   Moves extremities spontaneously, responds to painful stimuli  extremities: No edema, no clubbing ,no cyanosis Skin: Stage I pressure ulcer on the sacral region,deep tissue pressure injuries on the bilateral heels.  Data Reviewed:   CBC: Recent Labs  Lab 10/17/18 0240  WBC 6.2  NEUTROABS 3.5  HGB 11.8*  HCT 34.6*  MCV 82.4  PLT 179   Basic Metabolic Panel: Recent Labs  Lab 10/17/18 0240  NA 138  K 3.9  CL 105  CO2 25  GLUCOSE 123*  BUN 25*  CREATININE 0.78  CALCIUM 9.2   GFR: Estimated Creatinine Clearance: 82.7 mL/min (by C-G formula based on SCr of 0.78 mg/dL). Liver Function Tests: No results for input(s): AST, ALT, ALKPHOS, BILITOT, PROT, ALBUMIN in the last 168  hours. No results for input(s): LIPASE, AMYLASE in the last 168 hours. No results for input(s): AMMONIA in the last 168 hours. Coagulation Profile: No results for input(s): INR, PROTIME in the last 168 hours. Cardiac Enzymes: No results for input(s): CKTOTAL, CKMB, CKMBINDEX, TROPONINI in the last 168 hours. BNP (last 3 results) No results for input(s): PROBNP in the last 8760 hours. HbA1C: No results for input(s): HGBA1C in the last 72 hours. CBG: Recent Labs  Lab 10/16/18 1047 10/16/18 1557 10/16/18 2124 10/17/18 0605 10/17/18 2109  GLUCAP 121* 130* 111* 93 111*   Lipid Profile: No results for input(s): CHOL, HDL, LDLCALC, TRIG, CHOLHDL, LDLDIRECT in the last 72 hours. Thyroid Function Tests: No results for input(s): TSH, T4TOTAL, FREET4, T3FREE, THYROIDAB in the last 72 hours. Anemia Panel: No results for input(s): VITAMINB12, FOLATE, FERRITIN, TIBC, IRON, RETICCTPCT in the last 72 hours. Sepsis Labs: No results for input(s): PROCALCITON, LATICACIDVEN in the last 168 hours.  No results found for this or any previous visit (from the past 240 hour(s)).   Radiology Studies: No results found.  Scheduled Meds: . carvedilol  6.25 mg Per Tube BID WC  . chlorhexidine gluconate (MEDLINE KIT)  15 mL Mouth Rinse BID  . clonazePAM  0.5 mg Per Tube TID  . enoxaparin (LOVENOX) injection  40 mg Subcutaneous Q24H  . feeding supplement (PRO-STAT SUGAR FREE 64)  30 mL Per Tube BID  . folic acid  1 mg Per Tube Daily  . free water  150 mL Per Tube Q6H  . guaiFENesin  5 mL Per Tube Q6H  . mouth rinse  15 mL Mouth Rinse 10 times per day  . multivitamin  15 mL Per Tube Daily  . nutrition supplement (JUVEN)  1 packet Per Tube BID BM  . oxyCODONE  5 mg Per Tube Q6H  . pantoprazole sodium  40 mg Per Tube Q24H  . pregabalin  25 mg Per Tube BID  . QUEtiapine  25 mg Per Tube QHS  . thiamine  100 mg Per Tube Daily   Continuous Infusions: . sodium chloride Stopped (10/08/18 2212)  .  feeding supplement (JEVITY 1.2 CAL) 1,000 mL (10/18/18 0252)    LOS: 40 days   Roxan Hockey, MD [facility]  If 7PM-7AM, please contact night-coverage [email or web address] Password [email or web address] 10/18/2018, 3:11 PM

## 2018-10-18 NOTE — Procedures (Addendum)
Tracheostomy Change Note  Patient Details:   Name: Micheal Carey DOB: [birth year] MRN: [id number]    Airway Documentation:     Evaluation  O2 sats: stable throughout Complications: No apparent complications Patient did tolerate procedure well. Bilateral Breath Sounds: Diminished Trached Changed from [name] Cuffed to #6CFS SH ETCO2 good color change with BBS   SPO2 96% on RA ATC [name] 10/18/2018, 4:23 PM

## 2018-10-18 NOTE — Progress Notes (Signed)
Becoming more restless, MD aware. Ativan iv given continue to monitor.

## 2018-10-18 NOTE — Progress Notes (Signed)
Less congested, restarted tube feeding.

## 2018-10-18 NOTE — Progress Notes (Signed)
Would " wiggle worm " in bed, no amount of  repositioning  would keep him still. Klonopin given at 10am. Continue to monitor.

## 2018-10-19 LAB — GLUCOSE, CAPILLARY: Glucose-Capillary: 147 mg/dL — ABNORMAL HIGH (ref 70–99)

## 2018-10-19 NOTE — TOC Progression Note (Signed)
Transition of Care [facility]) - Progression Note    Patient Details  Name: Micheal Carey MRN: [id number] Date of Birth: 09/18/1950  Transition of Care [facility]) CM/SW [place], E[place] Phone Number: [phone] 10/19/2018, 12:21 PM  Clinical Narrative:     CSW has updated patient's fl2 reflecting uncuffed trach status, re faxing out referrals at this time for bed placement.   Expected Discharge Plan: Long Term Nursing Home Barriers to Discharge: No SNF bed  Expected Discharge Plan and Services Expected Discharge Plan: [place]     Post Acute Care Choice: Long Term Acute Care (LTAC) Living arrangements for the past 2 months: Single Family Home                                       Social Determinants of Health (SDOH) Interventions    Readmission Risk Interventions No flowsheet data found.

## 2018-10-19 NOTE — Progress Notes (Signed)
PROGRESS NOTE    Micheal Carey  HKV:[id number] DOB: Jun 12, 1950 DOA: 09/08/2018 PCP: [name], MD   Brief Narrative:  Patient is a 68 year old male who was initially found in the parking lot with PEA arrest, unknown downtime requiring medical ventilation.  UDS was positive for opiates, alcohol level was elevated at 276.  He had prior history of COPD, alcohol abuse, chronic pain/opiate dependence.  Could not be extubated and was subsequently trached on 8/17.  Status post PEG on 8/19.  PCCM following and managing vent, sedation,  weaning trials.  Patient transfered to hospitalist team on 8/21. Patient has prolonged hospitalization. 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  Social worker following for disposition: LTAC versus SNF.  LTAC placement is difficult because of disposition problem after he finishes LTAC stay.  Patient did not qualify for skilled nursing facility due to insurance issues.  SW checking long-term bed availability at a nursing facility.  Patient is medically stable for discharge as soon as placement is found.  Assessment & Plan:   Active Problems:   Pneumonia   Acute respiratory failure (HCC)   Malnutrition of moderate degree   Cardiac arrest (HCC)   Pressure injury of skin   Tracheostomy status (HCC)   Ventilator dependence ([place])   Acute lower UTI  Acute respiratory failure with hypoxia: Most likely secondary to opiate/alcohol overdose in the background of COPD.  Initially intubated on presentation.    Trached on 09/16/18.  On trach collar weaning since 10/03/2018 and tolerating.  -Discussed with Dr. Natasha Bence (PCCM)---  Will be easier to Place pt to Rehab if [name] is uncuffed, RT will switch to cuffed trach on 10/19/2018  PEA Arrest with severe Anoxic Encephalopathy: PEA suspected secondary to overdose.  Status post trach, remains encephalopathic.  Continue supportive  care.  Evaluated by neurology earlier.  EEG/MRI  suggestive of severe anoxic brain injury.  Discussed with family about goals of care.  Family wants full aggressive scope of treatment.  Patient remains in vegetative state.  Pneumonia: Completed different  antibiotics course including Zosyn, cefepime, ceftriaxone.  Blood cultures showed no growth .  Currently afebrile.  Sputum grew Acinetobacter and Citrobacter frenudii. Currentl;y stable.  UTI with Enterobacter species/Citrobacter: Was treated with meropenem.  Completed antibiotics on 10/06/18.  Dysphagia/intermittent episodes of vomiting/FEN: Status post PEG on 09/18/18.  Having bowel movements .  Diarrhea has resolved.  Nutrition following -Continue Jevity 1.2 at 60 mL an hour -Continue water flushes at 150 mL every 6 hours  Hyponatremia/hypokalemia: Resolved  Hypertension: Blood pressure was has been stable.  Continue current medications  Chronic alcohol abuse: Continue thiamine and folic acid  COPD:-Stable after treatment with steroids, antibiotics.  continue bronchodilators as needed.  Pressure injuries  of heel/upper back: Continue supportive care, wound care  Goals of care: Very poor prognosis with CNS encephalopathy based on EEG/MRI.  Several discussions held with family.  Family wants full scope of treatment.  Will need SNF with trach /vent support.  Social worker/ case Freight forwarder following and working on placement.  Nutrition Problem: Moderate Malnutrition Etiology: social / environmental circumstances(Hx substance abuse)  DVT prophylaxis: Lovenox Code Status: Full Family Communication:  Called son and given update on 10/18/18--  [name] at [phone] (lives in [place], [place]) and [place] (Daughter at [phone] Lives in [place], [place]  Disposition Plan: Waiting for skilled nursing facility vs LTACH.   Patient is hemodynamically stable for transfer as  soon as the bed is available. -Will be easier to Place pt to  Rehab if [name] is uncuffed  Consultants: PCCM, neurology  Procedures: EEG, MRI, intubation/extubation Trach PEG  Antimicrobials:  Anti-infectives (From admission, onward)   Start     Dose/Rate Route Frequency Ordered Stop   09/30/18 1400  meropenem (MERREM) 1 g in sodium chloride 0.9 % 100 mL IVPB     1 g 200 mL/hr over 30 Minutes Intravenous Every 8 hours 09/30/18 1119 10/07/18 0539   09/27/18 1200  ceFEPIme (MAXIPIME) 2 g in sodium chloride 0.9 % 100 mL IVPB  Status:  Discontinued     2 g 200 mL/hr over 30 Minutes Intravenous Every 8 hours 09/27/18 1133 09/30/18 1119   09/18/18 1330  ceFAZolin (ANCEF) IVPB 2g/100 mL premix     2 g 200 mL/hr over 30 Minutes Intravenous To Radiology 09/18/18 1323 09/18/18 1356   09/17/18 0900  ceFAZolin (ANCEF) IVPB 2g/100 mL premix     2 g 200 mL/hr over 30 Minutes Intravenous To Radiology 09/17/18 0849 09/18/18 0900   09/14/18 1000  cefTRIAXone (ROCEPHIN) 2 g in sodium chloride 0.9 % 100 mL IVPB  Status:  Discontinued     2 g 200 mL/hr over 30 Minutes Intravenous Every 24 hours 09/14/18 0919 09/16/18 1040   09/12/18 0000  vancomycin (VANCOCIN) 1,250 mg in sodium chloride 0.9 % 250 mL IVPB  Status:  Discontinued     1,250 mg 166.7 mL/hr over 90 Minutes Intravenous Every 24 hours 09/11/18 0044 09/11/18 1229   09/12/18 0000  vancomycin (VANCOCIN) IVPB 1000 mg/200 mL premix  Status:  Discontinued     1,000 mg 200 mL/hr over 60 Minutes Intravenous Every 24 hours 09/11/18 1229 09/13/18 1117   09/11/18 0800  piperacillin-tazobactam (ZOSYN) IVPB 3.375 g  Status:  Discontinued     3.375 g 12.5 mL/hr over 240 Minutes Intravenous Every 8 hours 09/11/18 0044 09/13/18 1117   09/11/18 0045  piperacillin-tazobactam (ZOSYN) IVPB 3.375 g     3.375 g 100 mL/hr over 30 Minutes Intravenous  Once 09/11/18 0040 09/11/18 0232   09/11/18 0045  vancomycin (VANCOCIN) 1,500 mg in sodium chloride 0.9 % 500 mL IVPB     1,500 mg 250 mL/hr over 120 Minutes Intravenous   Once 09/11/18 0040 09/11/18 0447     Subjective:  -Resting comfortably , noncommunicative -Son was able to video/facetime with him  Objective: Vitals:   10/19/18 1500 10/19/18 1542 10/19/18 1600 10/19/18 1800  BP: (!) 141/81 115/66 127/88 (!) 144/85  Pulse: (!) 104  (!) 105 99  Resp: 17   16  Temp:  98.1 F (36.7 C)    TempSrc:  Axillary    SpO2: 99% 98% 98% 99%  Weight:      Height:        Intake/Output Summary (Last 24 hours) at 10/19/2018 1852 Last data filed at 10/19/2018 1800 Gross per 24 hour  Intake 1020 ml  Output 1975 ml  Net -955 ml   Filed Weights   10/17/18 0337 10/18/18 0537 10/19/18 0500  Weight: 61.9 kg 64.4 kg 59.9 kg    Examination: General exam: Not in distress, deconditioned, persistent encephalopathy, vegetative state  HEENT: Trach collar Respiratory system: Bilateral equal air entry, no wheezes or crackles auscultated on anterior chest examination Cardiovascular system: S1 & S2 heard, RRR. No JVD, murmurs, rubs, gallops or clicks. Gastrointestinal system: Abdomen is nondistended, soft and nontender. No organomegaly or masses felt. Normal bowel sounds heard.  PEG in  situ Central nervous system: Not alert or oriented,   Moves extremities spontaneously, responds to painful stimuli  extremities: No edema, no clubbing ,no cyanosis Skin: Stage I pressure ulcer on the sacral region,deep tissue pressure injuries on the bilateral heels.  Data Reviewed:   CBC: Recent Labs  Lab 10/17/18 0240  WBC 6.2  NEUTROABS 3.5  HGB 11.8*  HCT 34.6*  MCV 82.4  PLT 825   Basic Metabolic Panel: Recent Labs  Lab 10/17/18 0240  NA 138  K 3.9  CL 105  CO2 25  GLUCOSE 123*  BUN 25*  CREATININE 0.78  CALCIUM 9.2   GFR: Estimated Creatinine Clearance: 77 mL/min (by C-G formula based on SCr of 0.78 mg/dL). Liver Function Tests: No results for input(s): AST, ALT, ALKPHOS, BILITOT, PROT, ALBUMIN in the last 168 hours. No results for input(s): LIPASE, AMYLASE  in the last 168 hours. No results for input(s): AMMONIA in the last 168 hours. Coagulation Profile: No results for input(s): INR, PROTIME in the last 168 hours. Cardiac Enzymes: No results for input(s): CKTOTAL, CKMB, CKMBINDEX, TROPONINI in the last 168 hours. BNP (last 3 results) No results for input(s): PROBNP in the last 8760 hours. HbA1C: No results for input(s): HGBA1C in the last 72 hours. CBG: Recent Labs  Lab 10/16/18 1557 10/16/18 2124 10/17/18 0605 10/17/18 2109 10/18/18 2114  GLUCAP 130* 111* 93 111* 123*   Lipid Profile: No results for input(s): CHOL, HDL, LDLCALC, TRIG, CHOLHDL, LDLDIRECT in the last 72 hours. Thyroid Function Tests: No results for input(s): TSH, T4TOTAL, FREET4, T3FREE, THYROIDAB in the last 72 hours. Anemia Panel: No results for input(s): VITAMINB12, FOLATE, FERRITIN, TIBC, IRON, RETICCTPCT in the last 72 hours. Sepsis Labs: No results for input(s): PROCALCITON, LATICACIDVEN in the last 168 hours.  No results found for this or any previous visit (from the past 240 hour(s)).   Radiology Studies: No results found.  Scheduled Meds: . carvedilol  6.25 mg Per Tube BID WC  . chlorhexidine gluconate (MEDLINE KIT)  15 mL Mouth Rinse BID  . clonazePAM  0.5 mg Per Tube TID  . enoxaparin (LOVENOX) injection  40 mg Subcutaneous Q24H  . feeding supplement (PRO-STAT SUGAR FREE 64)  30 mL Per Tube BID  . folic acid  1 mg Per Tube Daily  . free water  150 mL Per Tube Q6H  . guaiFENesin  5 mL Per Tube Q6H  . mouth rinse  15 mL Mouth Rinse 10 times per day  . multivitamin  15 mL Per Tube Daily  . nutrition supplement (JUVEN)  1 packet Per Tube BID BM  . oxyCODONE  5 mg Per Tube Q6H  . pantoprazole sodium  40 mg Per Tube Q24H  . pregabalin  25 mg Per Tube BID  . QUEtiapine  25 mg Per Tube QHS  . thiamine  100 mg Per Tube Daily   Continuous Infusions: . sodium chloride Stopped (10/08/18 2212)  . feeding supplement (JEVITY 1.2 CAL) 1,000 mL (10/19/18  1312)    LOS: 41 days   Roxan Hockey, MD [facility]  If 7PM-7AM, please contact night-coverage [email or web address] Password [facility] 10/19/2018, 6:52 PM

## 2018-10-19 NOTE — Progress Notes (Addendum)
Pt is disoriented. Vitals stable throughout the day. Oxygen is via trach collar @5 , FiO2, 21 turning positioning and suctioning continue as needed and every 2 hours. Condom catheter is in place, Jevity feeding continue @ 60 ml /hr, will continue to monitor the patient  Pt's son Face-time and saw his father and nurse updated him  Palma Holter, Therapist, sports

## 2018-10-19 NOTE — NC FL2 (Signed)
[place] LEVEL OF CARE SCREENING TOOL     IDENTIFICATION  Patient Name: Micheal Carey Birthdate: January 25, 1951 Sex: male Admission Date (Current Location): 09/08/2018  [facility] and [place] Number:  Herbalist and Address:  [facility] , [place] [address], [place], [place] [address]      Provider Number: [id number]  Attending Physician Name and Address:  Roxan Hockey, MD  Relative Name and Phone Number:  Makoa Satz, [phone]    Current Level of Care: Hospital Recommended Level of Care: [place], Other (Comment)(long term placement) Prior Approval Number:    Date Approved/Denied:   PASRR Number: [id number]  Discharge Plan: SNF    Current Diagnoses: Patient Active Problem List   Diagnosis Date Noted  . Acute lower UTI 10/02/2018  . Ventilator dependence ([place])   . Tracheostomy status ([place])   . Pressure injury of skin 09/25/2018  . Cardiac arrest ([place])   . Malnutrition of moderate degree 09/14/2018  . Acute respiratory failure ([place]) 09/08/2018  . Trochanteric bursitis, right hip 08/27/2017  . History of total hip arthroplasty, right 08/27/2017  . COPD (chronic obstructive pulmonary disease) ([place]) 12/03/2015  . Hypoglycemia 12/03/2015  . Sepsis ([place]) 12/03/2015  . Intractable vomiting with nausea 10/15/2015  . Transaminasemia 10/15/2015  . Diarrhea 10/15/2015  . Lactic acidosis 10/14/2015  . AKI (acute kidney injury) ([place]) 10/14/2015  . Alcohol abuse 10/14/2015  . COPD with acute exacerbation ([place]) 10/14/2015  . Pneumonia 10/14/2015  . Atypical lymphocytes present on peripheral blood smear   . Dehydration     Orientation RESPIRATION BLADDER Height & Weight        Tracheostomy(46m uncuffed) Incontinent, Indwelling catheter Weight: 132 lb 0.9 oz (59.9 kg) Height:  _0  (172.7 cm)  BEHAVIORAL SYMPTOMS/MOOD NEUROLOGICAL BOWEL NUTRITION STATUS      Incontinent Feeding tube(continuous)  AMBULATORY  STATUS COMMUNICATION OF NEEDS Skin   Total Care Does not communicate Other (Comment)(Heel wounds)                       Personal Care Assistance Level of Assistance  Total care, Dressing, Feeding, Bathing(Vegatative state) Bathing Assistance: Maximum assistance Feeding assistance: Maximum assistance Dressing Assistance: Maximum assistance Total Care Assistance: Maximum assistance   Functional Limitations Info  Speech, Hearing, Sight Sight Info: Adequate Hearing Info: Impaired Speech Info: Impaired    SPECIAL CARE FACTORS FREQUENCY  OT (By licensed OT), PT (By licensed PT)     PT Frequency: 5x weekly OT Frequency: 5x weekly            Contractures Contractures Info: Not present    Additional Factors Info  Allergies, Code Status Code Status Info: Full code Allergies Info: No known allergies           Current Medications (10/19/2018):  This is the current hospital active medication list Current Facility-Administered Medications  Medication Dose Route Frequency Provider Last Rate Last Dose  . 0.9 %  sodium chloride infusion   Intravenous PRN SChesley Mires MD   Stopped at 10/08/18 2212  . acetaminophen (TYLENOL) tablet 500 mg  500 mg Per Tube Q6H PRN Regalado, Belkys A, MD   500 mg at 09/28/18 1839  . albuterol (PROVENTIL) (2.5 MG/3ML) 0.083% nebulizer solution 2.5 mg  2.5 mg Nebulization Q2H PRN YRush Farmer MD   2.5 mg at 09/22/18 1925  . bisacodyl (DULCOLAX) suppository 10 mg  10 mg Rectal Daily PRN SChesley Mires MD      .  carvedilol (COREG) tablet 6.25 mg  6.25 mg Per Tube BID WC Candee Furbish, MD   6.25 mg at 10/19/18 0917  . chlorhexidine gluconate (MEDLINE KIT) (PERIDEX) 0.12 % solution 15 mL  15 mL Mouth Rinse BID Rush Farmer, MD   15 mL at 10/19/18 0800  . clonazePAM (KLONOPIN) tablet 0.5 mg  0.5 mg Per Tube TID Roxan Hockey, MD   0.5 mg at 10/19/18 0917  . docusate (COLACE) 50 MG/5ML liquid 100 mg  100 mg Per Tube BID PRN Chesley Mires, MD       . enoxaparin (LOVENOX) injection 40 mg  40 mg Subcutaneous Q24H Candee Furbish, MD   40 mg at 10/18/18 2132  . feeding supplement (JEVITY 1.2 CAL) liquid 1,000 mL  1,000 mL Per Tube Continuous Denton Brick, Courage, MD 60 mL/hr at 10/19/18 0057 1,000 mL at 10/19/18 0057  . feeding supplement (PRO-STAT SUGAR FREE 64) liquid 30 mL  30 mL Per Tube BID Kc, Ramesh, MD   30 mL at 10/19/18 0918  . fentaNYL (SUBLIMAZE) injection 25-100 mcg  25-100 mcg Intravenous Q30 min PRN Chesley Mires, MD   100 mcg at 82/95/62 1308  . folic acid (FOLVITE) tablet 1 mg  1 mg Per Tube Daily Chesley Mires, MD   1 mg at 10/19/18 0917  . free water 150 mL  150 mL Per Tube Q6H Magdalen Spatz, NP   150 mL at 10/19/18 0800  . Gerhardt's butt cream   Topical Daily PRN Shelly Coss, MD      . guaiFENesin (ROBITUSSIN) 100 MG/5ML solution 100 mg  5 mL Per Tube Q6H Shelly Coss, MD   100 mg at 10/19/18 0916  . LORazepam (ATIVAN) injection 0.5 mg  0.5 mg Intravenous Q6H PRN Denton Brick, Courage, MD   0.5 mg at 10/18/18 2345  . MEDLINE mouth rinse  15 mL Mouth Rinse 10 times per day Rush Farmer, MD   15 mL at 10/19/18 0919  . multivitamin liquid 15 mL  15 mL Per Tube Daily Domenic Polite, MD   15 mL at 10/19/18 0916  . nutrition supplement (JUVEN) (JUVEN) powder packet 1 packet  1 packet Per Tube BID BM Shelly Coss, MD   1 packet at 10/19/18 0918  . ondansetron (ZOFRAN) tablet 4 mg  4 mg Per Tube Q6H PRN Sherren Kerns D, RPH       Or  . ondansetron (ZOFRAN) injection 4 mg  4 mg Intravenous Q6H PRN Werner Lean, RPH   4 mg at 09/23/18 0000  . oxyCODONE (ROXICODONE) 5 MG/5ML solution 5 mg  5 mg Per Tube Q6H Emokpae, Courage, MD   5 mg at 10/19/18 0916  . pantoprazole sodium (PROTONIX) 40 mg/20 mL oral suspension 40 mg  40 mg Per Tube Q24H Chesley Mires, MD   40 mg at 10/19/18 0916  . pneumococcal 23 valent vaccine (PNU-IMMUNE) injection 0.5 mL  0.5 mL Intramuscular Prior to discharge Rush Farmer, MD      . pregabalin (LYRICA)  capsule 25 mg  25 mg Per Tube BID Candee Furbish, MD   25 mg at 10/19/18 0916  . QUEtiapine (SEROQUEL) tablet 25 mg  25 mg Per Tube QHS Roxan Hockey, MD   25 mg at 10/18/18 2132  . thiamine (VITAMIN B-1) tablet 100 mg  100 mg Per Tube Daily Chesley Mires, MD   100 mg at 10/19/18 6578     Discharge Medications: Please see discharge summary for a list  of discharge medications.  Relevant Imaging Results:  Relevant Lab Results:   Additional Information SSN: [id number]  Alberteen Sam, LCSW

## 2018-10-20 LAB — BASIC METABOLIC PANEL
Anion gap: 10 (ref 5–15)
BUN: 20 mg/dL (ref 8–23)
CO2: 20 mmol/L — ABNORMAL LOW (ref 22–32)
Calcium: 9.2 mg/dL (ref 8.9–10.3)
Chloride: 106 mmol/L (ref 98–111)
Creatinine, Ser: 0.79 mg/dL (ref 0.61–1.24)
GFR calc Af Amer: >60 mL/min (ref >60)
GFR calc non Af Amer: >60 mL/min (ref >60)
Glucose, Bld: 125 mg/dL — ABNORMAL HIGH (ref 70–99)
Potassium: 4.7 mmol/L (ref 3.5–5.1)
Sodium: 136 mmol/L (ref 135–145)

## 2018-10-20 LAB — CBC
HCT: 36.5 % — ABNORMAL LOW (ref 39.0–52.0)
Hemoglobin: 12.1 g/dL — ABNORMAL LOW (ref 13.0–17.0)
MCH: 27.6 pg (ref 26.0–34.0)
MCHC: 33.2 g/dL (ref 30.0–36.0)
MCV: 83.1 fL (ref 80.0–100.0)
Platelets: 186 10*3/uL (ref 150–400)
RBC: 4.39 MIL/uL (ref 4.22–5.81)
RDW: 13.8 % (ref 11.5–15.5)
WBC: 11.9 10*3/uL — ABNORMAL HIGH (ref 4.0–10.5)
nRBC: 0 % (ref 0.0–0.2)

## 2018-10-20 LAB — GLUCOSE, CAPILLARY: Glucose-Capillary: 138 mg/dL — ABNORMAL HIGH (ref 70–99)

## 2018-10-20 LAB — MAGNESIUM: Magnesium: 1.9 mg/dL (ref 1.7–2.4)

## 2018-10-20 NOTE — Progress Notes (Signed)
PROGRESS NOTE    Micheal Carey  CHE:[id number] DOB: Aug 24, 1950 DOA: 09/08/2018 PCP: [name], MD   Brief Narrative:  Patient is a 68 year old male who was initially found in the parking lot with PEA arrest, unknown downtime requiring medical ventilation.  UDS was positive for opiates, alcohol level was elevated at 276.  He had prior history of COPD, alcohol abuse, chronic pain/opiate dependence.  Could not be extubated and was subsequently trached on 8/17.  Status post PEG on 8/19.  PCCM following and managing vent, sedation,  weaning trials.  Patient transfered to hospitalist team on 8/21. Patient has prolonged hospitalization. 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  Social worker following for disposition: LTAC versus SNF.  LTAC placement is difficult because of disposition problem after he finishes LTAC stay.  Patient did not qualify for skilled nursing facility due to insurance issues.  SW checking long-term bed availability at a nursing facility.  Patient is medically stable for discharge as soon as placement is found. -[name] is uncuffed 10/19/18 to make it easier to place patient at facility  Assessment & Plan:   Active Problems:   Pneumonia   Acute respiratory failure (HCC)   Malnutrition of moderate degree   Cardiac arrest (HCC)   Pressure injury of skin   Tracheostomy status (HCC)   Ventilator dependence ([place])   Acute lower UTI  Acute respiratory failure with hypoxia: Most likely secondary to opiate/alcohol overdose in the background of COPD.  Initially intubated on presentation.    Trached on 09/16/18.  On trach collar weaning since 10/03/2018 and tolerating.  -Discussed with Dr. Natasha Bence (PCCM)---  Will be easier to Place pt to Rehab if [name] is uncuffed,so  RT will switch to cuffed trach on 10/19/2018  PEA Arrest with severe Anoxic Encephalopathy: PEA suspected secondary  to overdose.  Status post trach, remains encephalopathic.  Continue supportive care.  Evaluated by neurology earlier.  EEG/MRI  suggestive of severe anoxic brain injury.  Discussed with family about goals of care.  Family wants full aggressive scope of treatment.  Patient remains in vegetative state.  Pneumonia: Completed different  antibiotics course including Zosyn, cefepime, ceftriaxone.  Blood cultures showed no growth .  Currently afebrile.  Sputum grew Acinetobacter and Citrobacter frenudii. Currentl;y stable.  UTI with Enterobacter species/Citrobacter: Was treated with meropenem.  Completed antibiotics on 10/06/18.  Dysphagia/intermittent episodes of vomiting/FEN: Status post PEG on 09/18/18.  Having bowel movements .  Diarrhea has resolved.  Nutrition following -Continue Jevity 1.2 at 60 mL an hour -Continue water flushes at 150 mL every 6 hours  Hyponatremia/hypokalemia: Resolved  Hypertension: Blood pressure was has been stable.  Continue current medications  Chronic alcohol abuse: Continue thiamine and folic acid  COPD:-Stable after treatment with steroids, antibiotics.  continue bronchodilators as needed.  Pressure injuries  of heel/upper back: Continue supportive care, wound care  Goals of care: Very poor prognosis with CNS encephalopathy based on EEG/MRI.  Several discussions held with family.  Family wants full scope of treatment.  Will need SNF with trach  support.  Social worker/ case Freight forwarder following and working on placement.  Nutrition Problem: Moderate Malnutrition Etiology: social / environmental circumstances(Hx substance abuse)  DVT prophylaxis: Lovenox Code Status: Full Family Communication:  Called son and given update on 10/18/18--  Micheal Carey at [phone] (lives in [place], [place]) and Micheal Carey (Daughter at [phone] Lives in [place], [place]  Disposition Plan: Waiting for  skilled nursing facility vs LTACH.   Patient is hemodynamically stable for  transfer as soon as the bed is available. -  [name] is uncuffed 10/19/18 to make it easier to place patient at facility  Consultants: PCCM, neurology  Procedures: EEG, MRI, intubation/extubation Tracheostomy PEG  Antimicrobials:  Anti-infectives (From admission, onward)   Start     Dose/Rate Route Frequency Ordered Stop   09/30/18 1400  meropenem (MERREM) 1 g in sodium chloride 0.9 % 100 mL IVPB     1 g 200 mL/hr over 30 Minutes Intravenous Every 8 hours 09/30/18 1119 10/07/18 0539   09/27/18 1200  ceFEPIme (MAXIPIME) 2 g in sodium chloride 0.9 % 100 mL IVPB  Status:  Discontinued     2 g 200 mL/hr over 30 Minutes Intravenous Every 8 hours 09/27/18 1133 09/30/18 1119   09/18/18 1330  ceFAZolin (ANCEF) IVPB 2g/100 mL premix     2 g 200 mL/hr over 30 Minutes Intravenous To Radiology 09/18/18 1323 09/18/18 1356   09/17/18 0900  ceFAZolin (ANCEF) IVPB 2g/100 mL premix     2 g 200 mL/hr over 30 Minutes Intravenous To Radiology 09/17/18 0849 09/18/18 0900   09/14/18 1000  cefTRIAXone (ROCEPHIN) 2 g in sodium chloride 0.9 % 100 mL IVPB  Status:  Discontinued     2 g 200 mL/hr over 30 Minutes Intravenous Every 24 hours 09/14/18 0919 09/16/18 1040   09/12/18 0000  vancomycin (VANCOCIN) 1,250 mg in sodium chloride 0.9 % 250 mL IVPB  Status:  Discontinued     1,250 mg 166.7 mL/hr over 90 Minutes Intravenous Every 24 hours 09/11/18 0044 09/11/18 1229   09/12/18 0000  vancomycin (VANCOCIN) IVPB 1000 mg/200 mL premix  Status:  Discontinued     1,000 mg 200 mL/hr over 60 Minutes Intravenous Every 24 hours 09/11/18 1229 09/13/18 1117   09/11/18 0800  piperacillin-tazobactam (ZOSYN) IVPB 3.375 g  Status:  Discontinued     3.375 g 12.5 mL/hr over 240 Minutes Intravenous Every 8 hours 09/11/18 0044 09/13/18 1117   09/11/18 0045  piperacillin-tazobactam (ZOSYN) IVPB 3.375 g     3.375 g 100 mL/hr over 30 Minutes Intravenous  Once 09/11/18 0040 09/11/18 0232   09/11/18 0045  vancomycin (VANCOCIN)  1,500 mg in sodium chloride 0.9 % 500 mL IVPB     1,500 mg 250 mL/hr over 120 Minutes Intravenous  Once 09/11/18 0040 09/11/18 0447     Subjective:  -Resting comfortably , noncommunicative -Son was able to video/facetime with him on 10/19/2018 -Tolerating tube feeding well  Objective: Vitals:   10/20/18 0728 10/20/18 0826 10/20/18 1105 10/20/18 1135  BP: 136/78 136/78  113/74  Pulse: (!) 101  (!) 103 98  Resp: _0 Temp: 98.1 F (36.7 C)   98.1 F (36.7 C)  TempSrc: Axillary   Axillary  SpO2: 100% 100% 100% 99%  Weight:      Height:        Intake/Output Summary (Last 24 hours) at 10/20/2018 1550 Last data filed at 10/20/2018 0600 Gross per 24 hour  Intake 1368 ml  Output 635 ml  Net 733 ml   Filed Weights   10/18/18 0537 10/19/18 0500 10/20/18 0500  Weight: 64.4 kg 59.9 kg 60.6 kg    Examination: General exam: Not in distress, deconditioned, persistent encephalopathy, vegetative state  HEENT: Trach collar Respiratory system: Bilateral equal air entry, no wheezes or crackles auscultated on anterior chest examination Cardiovascular system: S1 & S2 heard, RRR. No JVD, murmurs,  rubs, gallops or clicks. Gastrointestinal system: Abdomen is nondistended, soft and nontender.. Normal bowel sounds heard.  PEG in situ Central nervous system: Not alert or oriented,   Moves extremities spontaneously, responds to painful stimuli  extremities: No edema, no clubbing ,no cyanosis Skin: Stage I pressure ulcer on the sacral region,deep tissue pressure injuries on the bilateral heels.  Data Reviewed:   CBC: Recent Labs  Lab 10/17/18 0240 10/20/18 0214  WBC 6.2 11.9*  NEUTROABS 3.5  --   HGB 11.8* 12.1*  HCT 34.6* 36.5*  MCV 82.4 83.1  PLT 218 322   Basic Metabolic Panel: Recent Labs  Lab 10/17/18 0240 10/20/18 0214  NA 138 136  K 3.9 4.7  CL 105 106  CO2 25 20*  GLUCOSE 123* 125*  BUN 25* 20  CREATININE 0.78 0.79  CALCIUM 9.2 9.2  MG  --  1.9   GFR:  Estimated Creatinine Clearance: 77.9 mL/min (by C-G formula based on SCr of 0.79 mg/dL). Liver Function Tests: No results for input(s): AST, ALT, ALKPHOS, BILITOT, PROT, ALBUMIN in the last 168 hours. No results for input(s): LIPASE, AMYLASE in the last 168 hours. No results for input(s): AMMONIA in the last 168 hours. Coagulation Profile: No results for input(s): INR, PROTIME in the last 168 hours. Cardiac Enzymes: No results for input(s): CKTOTAL, CKMB, CKMBINDEX, TROPONINI in the last 168 hours. BNP (last 3 results) No results for input(s): PROBNP in the last 8760 hours. HbA1C: No results for input(s): HGBA1C in the last 72 hours. CBG: Recent Labs  Lab 10/16/18 2124 10/17/18 0605 10/17/18 2109 10/18/18 2114 10/19/18 2059  GLUCAP 111* 93 111* 123* 147*   Lipid Profile: No results for input(s): CHOL, HDL, LDLCALC, TRIG, CHOLHDL, LDLDIRECT in the last 72 hours. Thyroid Function Tests: No results for input(s): TSH, T4TOTAL, FREET4, T3FREE, THYROIDAB in the last 72 hours. Anemia Panel: No results for input(s): VITAMINB12, FOLATE, FERRITIN, TIBC, IRON, RETICCTPCT in the last 72 hours. Sepsis Labs: No results for input(s): PROCALCITON, LATICACIDVEN in the last 168 hours.  No results found for this or any previous visit (from the past 240 hour(s)).   Radiology Studies: No results found.  Scheduled Meds: . carvedilol  6.25 mg Per Tube BID WC  . chlorhexidine gluconate (MEDLINE KIT)  15 mL Mouth Rinse BID  . clonazePAM  0.5 mg Per Tube TID  . enoxaparin (LOVENOX) injection  40 mg Subcutaneous Q24H  . feeding supplement (PRO-STAT SUGAR FREE 64)  30 mL Per Tube BID  . folic acid  1 mg Per Tube Daily  . free water  150 mL Per Tube Q6H  . guaiFENesin  5 mL Per Tube Q6H  . mouth rinse  15 mL Mouth Rinse 10 times per day  . multivitamin  15 mL Per Tube Daily  . nutrition supplement (JUVEN)  1 packet Per Tube BID BM  . oxyCODONE  5 mg Per Tube Q6H  . pantoprazole sodium  40 mg  Per Tube Q24H  . pregabalin  25 mg Per Tube BID  . QUEtiapine  25 mg Per Tube QHS  . thiamine  100 mg Per Tube Daily   Continuous Infusions: . sodium chloride Stopped (10/08/18 2212)  . feeding supplement (JEVITY 1.2 CAL) 1,000 mL (10/20/18 0442)    LOS: 42 days   Roxan Hockey, MD [facility]  If 7PM-7AM, please contact night-coverage [email or web address] Password [email or web address] 10/20/2018, 3:50 PM

## 2018-10-21 DIAGNOSIS — J9601 Acute respiratory failure with hypoxia: Secondary | ICD-10-CM | POA: Diagnosis present

## 2018-10-21 DIAGNOSIS — F1010 Alcohol abuse, uncomplicated: Secondary | ICD-10-CM

## 2018-10-21 DIAGNOSIS — R1310 Dysphagia, unspecified: Secondary | ICD-10-CM

## 2018-10-21 DIAGNOSIS — G931 Anoxic brain damage, not elsewhere classified: Secondary | ICD-10-CM | POA: Diagnosis not present

## 2018-10-21 DIAGNOSIS — T5191XA Toxic effect of unspecified alcohol, accidental (unintentional), initial encounter: Secondary | ICD-10-CM | POA: Diagnosis present

## 2018-10-21 LAB — GLUCOSE, CAPILLARY: Glucose-Capillary: 145 mg/dL — ABNORMAL HIGH (ref 70–99)

## 2018-10-21 NOTE — Care Management Important Message (Signed)
Important Message  Patient Details  Name: Micheal Carey MRN: [id number] Date of Birth: 14-Jun-1950   Medicare Important Message Given:  Yes     Memory Argue 10/21/2018, 1:30 PM

## 2018-10-21 NOTE — Progress Notes (Addendum)
PROGRESS NOTE    Micheal Carey  QAS:[id number] DOB: 08/01/1950 DOA: 09/08/2018 PCP: [name], MD   Brief Narrative:  Patient is a 68 year old male who was initially found in the parking lot with PEA arrest, unknown downtime requiring medical ventilation.  UDS was positive for opiates, alcohol level was elevated at 276.  He had prior history of COPD, alcohol abuse, chronic pain/opiate dependence.  Could not be extubated and was subsequently trached on 8/17.  Status post PEG on 8/19.  PCCM following and managing vent, sedation,  weaning trials.  Patient transfered to hospitalist team on 8/21. Patient has prolonged hospitalization. 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  Social worker following for disposition: LTAC versus SNF.  LTAC placement is difficult because of disposition problem after he finishes LTAC stay.  Patient did not qualify for skilled nursing facility due to insurance issues.  SW checking long-term bed availability at a nursing facility.  Patient is medically stable for discharge as soon as placement is found. -[name] is uncuffed 10/19/18 to make it easier to place patient at facility  Assessment & Plan:   Principal Problem:   Alcohol overdose Active Problems:   Alcohol abuse   Pneumonia   Drug overdose   Cardiac arrest ([place])   Acute lower UTI   Anoxic encephalopathy (HCC)   Dysphagia   Acute respiratory failure with hypoxia ([place])   COPD with acute exacerbation (HCC)   COPD (chronic obstructive pulmonary disease) ([place])   Sepsis ([place])   Malnutrition of moderate degree   Pressure injury of skin   Tracheostomy status ([place])   Ventilator dependence ([place])  Acute respiratory failure with hypoxia: Most likely secondary to opiate/alcohol overdose in the background of COPD.  Initially intubated on presentation.    Trached on 09/16/18.  On trach collar weaning since  10/03/2018 and tolerating.  -Discussed with Dr. Lamonte Sakai (PCCM)---  Will be easier to Place pt to Rehab if [name] is uncuffed, so RT will switch to uncuffed trach on 10/19/2018   PEA Arrest with severe Anoxic Encephalopathy: PEA suspected secondary to overdose.  Status post trach, remains encephalopathic.  Continue supportive care.  Evaluated by neurology earlier.  EEG/MRI  suggestive of severe anoxic brain injury.  Discussed with family about goals of care.  Family wants full aggressive scope of treatment.  Patient remains in vegetative state.  Pneumonia: Completed different  antibiotics course including Zosyn, cefepime, ceftriaxone.  Blood cultures showed no growth .  Currently afebrile.  Sputum grew Acinetobacter and Citrobacter frenudii. Currently stable.  UTI with Enterobacter species/Citrobacter: Was treated with meropenem.  Completed antibiotics on 10/06/18.  Dysphagia/intermittent episodes of vomiting/FEN: Status post PEG on 09/18/18.  Having bowel movements .  Diarrhea has resolved.  Nutrition following -Continue Jevity 1.2 at 60 mL an hour -Continue water flushes at 150 mL every 6 hours  Hyponatremia/hypokalemia: Resolved  Hypertension: Blood pressure was has been stable.  Continue coreg  Chronic alcohol abuse: Continue thiamine and folic acid  COPD:-Stable after treatment with steroids, antibiotics.  continue bronchodilators as needed.  Pressure injuries  of heel/upper back: not severe, Continue supportive care, wound care  Goals of care: poor prognosis with CNS encephalopathy based on EEG/MRI.  Several discussions held with family.  Family wants full scope of treatment.  Will need SNF with trach  support.  Social worker/ case Freight forwarder following and working on placement.  Nutrition Problem: Moderate Malnutrition Etiology: social / environmental circumstances(Hx  substance abuse)  DVT prophylaxis: Lovenox Code Status: Full Family Communication:  Called son and given update on 10/18/18   [name] Disposition Plan: Waiting for skilled nursing facility vs LTACH.   Patient is hemodynamically stable for transfer as soon as the bed is available. -  [name] is uncuffed 10/19/18 to make it easier to place patient at facility  Kelly Splinter MD  [facility] [phone] 10/21/2018, 9:44 AM      Consultants: PCCM, neurology  Procedures: EEG, MRI, intubation/extubation Tracheostomy PEG  Antimicrobials:  Anti-infectives (From admission, onward)   Start     Dose/Rate Route Frequency Ordered Stop   09/30/18 1400  meropenem (MERREM) 1 g in sodium chloride 0.9 % 100 mL IVPB     1 g 200 mL/hr over 30 Minutes Intravenous Every 8 hours 09/30/18 1119 10/07/18 0539   09/27/18 1200  ceFEPIme (MAXIPIME) 2 g in sodium chloride 0.9 % 100 mL IVPB  Status:  Discontinued     2 g 200 mL/hr over 30 Minutes Intravenous Every 8 hours 09/27/18 1133 09/30/18 1119   09/18/18 1330  ceFAZolin (ANCEF) IVPB 2g/100 mL premix     2 g 200 mL/hr over 30 Minutes Intravenous To Radiology 09/18/18 1323 09/18/18 1356   09/17/18 0900  ceFAZolin (ANCEF) IVPB 2g/100 mL premix     2 g 200 mL/hr over 30 Minutes Intravenous To Radiology 09/17/18 0849 09/18/18 0900   09/14/18 1000  cefTRIAXone (ROCEPHIN) 2 g in sodium chloride 0.9 % 100 mL IVPB  Status:  Discontinued     2 g 200 mL/hr over 30 Minutes Intravenous Every 24 hours 09/14/18 0919 09/16/18 1040   09/12/18 0000  vancomycin (VANCOCIN) 1,250 mg in sodium chloride 0.9 % 250 mL IVPB  Status:  Discontinued     1,250 mg 166.7 mL/hr over 90 Minutes Intravenous Every 24 hours 09/11/18 0044 09/11/18 1229   09/12/18 0000  vancomycin (VANCOCIN) IVPB 1000 mg/200 mL premix  Status:  Discontinued     1,000 mg 200 mL/hr over 60 Minutes Intravenous Every 24 hours 09/11/18 1229 09/13/18 1117   09/11/18 0800  piperacillin-tazobactam (ZOSYN) IVPB 3.375 g  Status:  Discontinued     3.375 g 12.5 mL/hr over 240 Minutes Intravenous Every 8 hours 09/11/18 0044  09/13/18 1117   09/11/18 0045  piperacillin-tazobactam (ZOSYN) IVPB 3.375 g     3.375 g 100 mL/hr over 30 Minutes Intravenous  Once 09/11/18 0040 09/11/18 0232   09/11/18 0045  vancomycin (VANCOCIN) 1,500 mg in sodium chloride 0.9 % 500 mL IVPB     1,500 mg 250 mL/hr over 120 Minutes Intravenous  Once 09/11/18 0040 09/11/18 0447     Subjective:  -Resting comfortably , noncommunicative -Son was able to video/facetime with him on 10/19/2018 -Tolerating tube feeding well  Objective: Vitals:   10/21/18 0324 10/21/18 0339 10/21/18 0612 10/21/18 0718  BP:  (!) 152/79  (!) 144/71  Pulse: (!) 105 (!) 108  (!) 108  Resp: (!) '25 20  13  ' Temp:  98.1 F (36.7 C)    TempSrc:  Axillary    SpO2: 97% 98%    Weight:   60.7 kg   Height:        Intake/Output Summary (Last 24 hours) at 10/21/2018 0819 Last data filed at 10/21/2018 0610 Gross per 24 hour  Intake 1190 ml  Output 1150 ml  Net 40 ml   Filed Weights   10/19/18 0500 10/20/18 0500 10/21/18 0612  Weight: 59.9 kg  60.6 kg 60.7 kg    Examination: General exam: Not in distress, deconditioned, persistent encephalopathy, vegetative state  HEENT: Trach collar Respiratory system: Bilateral equal air entry, no wheezes or crackles auscultated on anterior chest examination Cardiovascular system: S1 & S2 heard, RRR. No JVD, murmurs, rubs, gallops or clicks. Gastrointestinal system: Abdomen is nondistended, soft and nontender.. Normal bowel sounds heard.  PEG in situ Central nervous system: Not alert or oriented,   Moves extremities spontaneously, responds to painful stimuli  extremities: No edema, no clubbing ,no cyanosis Skin: Stage I pressure ulcer on the sacral region,deep tissue pressure injuries on the bilateral heels.  Data Reviewed:   CBC: Recent Labs  Lab 10/17/18 0240 10/20/18 0214  WBC 6.2 11.9*  NEUTROABS 3.5  --   HGB 11.8* 12.1*  HCT 34.6* 36.5*  MCV 82.4 83.1  PLT 218 220   Basic Metabolic Panel: Recent Labs   Lab 10/17/18 0240 10/20/18 0214  NA 138 136  K 3.9 4.7  CL 105 106  CO2 25 20*  GLUCOSE 123* 125*  BUN 25* 20  CREATININE 0.78 0.79  CALCIUM 9.2 9.2  MG  --  1.9   GFR: Estimated Creatinine Clearance: 78 mL/min (by C-G formula based on SCr of 0.79 mg/dL). Liver Function Tests: No results for input(s): AST, ALT, ALKPHOS, BILITOT, PROT, ALBUMIN in the last 168 hours. No results for input(s): LIPASE, AMYLASE in the last 168 hours. No results for input(s): AMMONIA in the last 168 hours. Coagulation Profile: No results for input(s): INR, PROTIME in the last 168 hours. Cardiac Enzymes: No results for input(s): CKTOTAL, CKMB, CKMBINDEX, TROPONINI in the last 168 hours. BNP (last 3 results) No results for input(s): PROBNP in the last 8760 hours. HbA1C: No results for input(s): HGBA1C in the last 72 hours. CBG: Recent Labs  Lab 10/17/18 0605 10/17/18 2109 10/18/18 2114 10/19/18 2059 10/20/18 2100  GLUCAP 93 111* 123* 147* 138*   Lipid Profile: No results for input(s): CHOL, HDL, LDLCALC, TRIG, CHOLHDL, LDLDIRECT in the last 72 hours. Thyroid Function Tests: No results for input(s): TSH, T4TOTAL, FREET4, T3FREE, THYROIDAB in the last 72 hours. Anemia Panel: No results for input(s): VITAMINB12, FOLATE, FERRITIN, TIBC, IRON, RETICCTPCT in the last 72 hours. Sepsis Labs: No results for input(s): PROCALCITON, LATICACIDVEN in the last 168 hours.  No results found for this or any previous visit (from the past 240 hour(s)).   Radiology Studies: No results found.  Scheduled Meds: . carvedilol  6.25 mg Per Tube BID WC  . chlorhexidine gluconate (MEDLINE KIT)  15 mL Mouth Rinse BID  . clonazePAM  0.5 mg Per Tube TID  . enoxaparin (LOVENOX) injection  40 mg Subcutaneous Q24H  . feeding supplement (PRO-STAT SUGAR FREE 64)  30 mL Per Tube BID  . folic acid  1 mg Per Tube Daily  . free water  150 mL Per Tube Q6H  . guaiFENesin  5 mL Per Tube Q6H  . mouth rinse  15 mL Mouth Rinse  10 times per day  . multivitamin  15 mL Per Tube Daily  . nutrition supplement (JUVEN)  1 packet Per Tube BID BM  . oxyCODONE  5 mg Per Tube Q6H  . pantoprazole sodium  40 mg Per Tube Q24H  . pregabalin  25 mg Per Tube BID  . QUEtiapine  25 mg Per Tube QHS  . thiamine  100 mg Per Tube Daily   Continuous Infusions: . sodium chloride Stopped (10/08/18 2212)  . feeding supplement (JEVITY  1.2 CAL) 60 mL/hr at 10/20/18 2010    LOS: 43 days    If 7PM-7AM, please contact night-coverage [email or web address] Password [facility] 10/21/2018, 8:19 AM

## 2018-10-21 NOTE — Consult Note (Signed)
[place] Nurse wound follow up Patient receiving care in [place]. Wound type: Left heel is a healing stage 2, measures 1.2 cm x 2 cm, is 100% pink, no drainage, no odor, protected with a heel foam dressing.  The DTPI to the right heel has completely resolved. Dressing procedure/placement/frequency: Continue the use of heel foam dressing and Prevalon boots. Val Riles, RN, MSN, CWOCN, CNS-BC, pager [phone]

## 2018-10-21 NOTE — Care Management Important Message (Signed)
Important Message  Patient Details  Name: Micheal Carey MRN: [id number] Date of Birth: 22-Jun-1950   Medicare Important Message Given:  Yes     [name] 10/21/2018, 2:49 PM

## 2018-10-22 LAB — GLUCOSE, CAPILLARY: Glucose-Capillary: 128 mg/dL — ABNORMAL HIGH (ref 70–99)

## 2018-10-22 MED ORDER — INFLUENZA VAC A&B SA ADJ QUAD 0.5 ML IM PRSY
0.5000 mL | PREFILLED_SYRINGE | INTRAMUSCULAR | Status: AC
  Administered 2018-10-23: 10:00:00 0.5 mL via INTRAMUSCULAR
  Filled 2018-10-22: qty 0.5

## 2018-10-22 NOTE — Progress Notes (Signed)
PROGRESS NOTE    Micheal Carey  AXE:[id number] DOB: [birth year] DOA: 09/08/2018 PCP: [name], MD   Brief Narrative:  Patient is a 68 year old male who was initially found in the parking lot with PEA arrest, unknown downtime requiring medical ventilation.  UDS was positive for opiates, alcohol level was elevated at 276.  He had prior history of COPD, alcohol abuse, chronic pain/opiate dependence.  Could not be extubated and was subsequently trached on 8/17.  Status post PEG on 8/19.  PCCM following and managing vent, sedation,  weaning trials.  Patient transfered to hospitalist team on 8/21. Patient has prolonged hospitalization. 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  Social worker following for disposition: LTAC versus SNF.  LTAC placement is difficult because of disposition problem after he finishes LTAC stay.  Patient did not qualify for skilled nursing facility due to insurance issues.  SW checking long-term bed availability at a nursing facility.  Patient is medically stable for discharge as soon as placement is found. -[name] is uncuffed 10/19/18 to make it easier to place patient at facility  Assessment & Plan:   Principal Problem:   Cardiac arrest [facility]) Active Problems:   Tracheostomy status ([place])   Anoxic encephalopathy ([place])   Dysphagia   Acute respiratory failure with hypoxia ([place])   Alcohol abuse   COPD with acute exacerbation ([place])   Pneumonia   COPD (chronic obstructive pulmonary disease) ([place])   Sepsis ([place])   Malnutrition of moderate degree   Pressure injury of skin   Acute lower UTI   Alcohol overdose   Drug overdose  Acute respiratory failure with hypoxia: Most likely secondary to opiate/alcohol overdose in the background of COPD.  Initially intubated on presentation.    Trached on 09/16/18.  On trach collar weaning since 10/03/2018 and tolerating.   -Discussed with Dr. Lamonte Sakai (PCCM)---  Will be easier to Place pt to Rehab if [name] is uncuffed, so RT switched to uncuffed trach on 10/19/2018   PEA Arrest with severe Anoxic Encephalopathy: PEA suspected secondary to overdose.  Status post trach, remains encephalopathic.  Continue supportive care.  Evaluated by neurology earlier.  EEG/MRI  suggestive of severe anoxic brain injury.  Discussed with family about goals of care.  Family wants full aggressive scope of treatment.  Patient remains in vegetative state.  Pneumonia: Completed different  antibiotics course including Zosyn, cefepime, ceftriaxone.  Blood cultures showed no growth .  Currently afebrile.  Sputum grew Acinetobacter and Citrobacter frenudii. Currently stable.  UTI with Enterobacter species/Citrobacter: Was treated with meropenem.  Completed antibiotics on 10/06/18.  Dysphagia/intermittent episodes of vomiting/FEN: Status post PEG on 09/18/18.  Having bowel movements .  Diarrhea has resolved.  Nutrition following -Continue Jevity 1.2 at 60 mL an hour -Continue water flushes at 150 mL every 6 hours  Hyponatremia/hypokalemia: Resolved  Hypertension: Blood pressure was has been stable.  Continue coreg  Chronic alcohol abuse: Continue thiamine and folic acid  COPD:-Stable after treatment with steroids, antibiotics.  continue bronchodilators as needed.  Pressure injuries  of heel/upper back: not severe, Continue supportive care, wound care  Goals of care: poor prognosis with CNS encephalopathy based on EEG/MRI.  Several discussions held with family.  Family wants full scope of treatment.  Will need SNF with trach  support.  Social worker/ case Freight forwarder following and working on placement.  Nutrition Problem: Moderate Malnutrition Etiology: social / environmental circumstances(Hx substance abuse)  DVT prophylaxis: Lovenox  Code Status: Full Family Communication:  MD called son and given update on 10/18/18  [name] and [facility] Disposition Plan: Waiting for skilled nursing facility vs LTACH.   Patient is hemodynamically stable for transfer as soon as the bed is available. -  [name] is uncuffed 10/19/18 to make it easier to place patient at facility  Kelly Splinter MD  [facility]  pgr [phone] 10/22/2018, 10:53 AM      Consultants: PCCM, neurology  Procedures: EEG, MRI, intubation/extubation Tracheostomy PEG  Antimicrobials:  Anti-infectives (From admission, onward)   Start     Dose/Rate Route Frequency Ordered Stop   09/30/18 1400  meropenem (MERREM) 1 g in sodium chloride 0.9 % 100 mL IVPB     1 g 200 mL/hr over 30 Minutes Intravenous Every 8 hours 09/30/18 1119 10/07/18 0539   09/27/18 1200  ceFEPIme (MAXIPIME) 2 g in sodium chloride 0.9 % 100 mL IVPB  Status:  Discontinued     2 g 200 mL/hr over 30 Minutes Intravenous Every 8 hours 09/27/18 1133 09/30/18 1119   09/18/18 1330  ceFAZolin (ANCEF) IVPB 2g/100 mL premix     2 g 200 mL/hr over 30 Minutes Intravenous To Radiology 09/18/18 1323 09/18/18 1356   09/17/18 0900  ceFAZolin (ANCEF) IVPB 2g/100 mL premix     2 g 200 mL/hr over 30 Minutes Intravenous To Radiology 09/17/18 0849 09/18/18 0900   09/14/18 1000  cefTRIAXone (ROCEPHIN) 2 g in sodium chloride 0.9 % 100 mL IVPB  Status:  Discontinued     2 g 200 mL/hr over 30 Minutes Intravenous Every 24 hours 09/14/18 0919 09/16/18 1040   09/12/18 0000  vancomycin (VANCOCIN) 1,250 mg in sodium chloride 0.9 % 250 mL IVPB  Status:  Discontinued     1,250 mg 166.7 mL/hr over 90 Minutes Intravenous Every 24 hours 09/11/18 0044 09/11/18 1229   09/12/18 0000  vancomycin (VANCOCIN) IVPB 1000 mg/200 mL premix  Status:  Discontinued     1,000 mg 200 mL/hr over 60 Minutes Intravenous Every 24 hours 09/11/18 1229 09/13/18 1117   09/11/18 0800  piperacillin-tazobactam (ZOSYN) IVPB 3.375 g  Status:  Discontinued     3.375 g 12.5 mL/hr over 240 Minutes Intravenous Every 8 hours 09/11/18 0044 09/13/18 1117   09/11/18  0045  piperacillin-tazobactam (ZOSYN) IVPB 3.375 g     3.375 g 100 mL/hr over 30 Minutes Intravenous  Once 09/11/18 0040 09/11/18 0232   09/11/18 0045  vancomycin (VANCOCIN) 1,500 mg in sodium chloride 0.9 % 500 mL IVPB     1,500 mg 250 mL/hr over 120 Minutes Intravenous  Once 09/11/18 0040 09/11/18 0447     Subjective:  -Resting comfortably , noncommunicative -Son was able to video/facetime with him on 10/19/2018 -Tolerating tube feeding well  Objective: Vitals:   10/22/18 0354 10/22/18 0414 10/22/18 0500 10/22/18 0827  BP:      Pulse: 89   99  Resp: 18   18  Temp:      TempSrc:  Axillary    SpO2:    98%  Weight:   60.8 kg   Height:        Intake/Output Summary (Last 24 hours) at 10/22/2018 1053 Last data filed at 10/22/2018 0800 Gross per 24 hour  Intake 1140 ml  Output 650 ml  Net 490 ml   Filed Weights   10/20/18 0500 10/21/18 0612 10/22/18 0500  Weight: 60.6 kg 60.7 kg 60.8 kg    Examination: General exam: Not in distress, deconditioned,  persistent encephalopathy, vegetative state  HEENT: Trach collar Respiratory system: Bilateral equal air entry, no wheezes or crackles auscultated on anterior chest examination Cardiovascular system: S1 & S2 heard, RRR. No JVD, murmurs, rubs, gallops or clicks. Gastrointestinal system: Abdomen is nondistended, soft and nontender.. Normal bowel sounds heard.  PEG in situ Central nervous system: Not alert or oriented,   Moves extremities spontaneously, responds to painful stimuli  extremities: No edema, no clubbing ,no cyanosis Skin: Stage I pressure ulcer on the sacral region,deep tissue pressure injuries on the bilateral heels.  Data Reviewed:   CBC: Recent Labs  Lab 10/17/18 0240 10/20/18 0214  WBC 6.2 11.9*  NEUTROABS 3.5  --   HGB 11.8* 12.1*  HCT 34.6* 36.5*  MCV 82.4 83.1  PLT 218 297   Basic Metabolic Panel: Recent Labs  Lab 10/17/18 0240 10/20/18 0214  NA 138 136  K 3.9 4.7  CL 105 106  CO2 25 20*   GLUCOSE 123* 125*  BUN 25* 20  CREATININE 0.78 0.79  CALCIUM 9.2 9.2  MG  --  1.9   GFR: Estimated Creatinine Clearance: 78.1 mL/min (by C-G formula based on SCr of 0.79 mg/dL). Liver Function Tests: No results for input(s): AST, ALT, ALKPHOS, BILITOT, PROT, ALBUMIN in the last 168 hours. No results for input(s): LIPASE, AMYLASE in the last 168 hours. No results for input(s): AMMONIA in the last 168 hours. Coagulation Profile: No results for input(s): INR, PROTIME in the last 168 hours. Cardiac Enzymes: No results for input(s): CKTOTAL, CKMB, CKMBINDEX, TROPONINI in the last 168 hours. BNP (last 3 results) No results for input(s): PROBNP in the last 8760 hours. HbA1C: No results for input(s): HGBA1C in the last 72 hours. CBG: Recent Labs  Lab 10/17/18 2109 10/18/18 2114 10/19/18 2059 10/20/18 2100 10/21/18 2126  GLUCAP 111* 123* 147* 138* 145*   Lipid Profile: No results for input(s): CHOL, HDL, LDLCALC, TRIG, CHOLHDL, LDLDIRECT in the last 72 hours. Thyroid Function Tests: No results for input(s): TSH, T4TOTAL, FREET4, T3FREE, THYROIDAB in the last 72 hours. Anemia Panel: No results for input(s): VITAMINB12, FOLATE, FERRITIN, TIBC, IRON, RETICCTPCT in the last 72 hours. Sepsis Labs: No results for input(s): PROCALCITON, LATICACIDVEN in the last 168 hours.  No results found for this or any previous visit (from the past 240 hour(s)).   Radiology Studies: No results found.  Scheduled Meds: . carvedilol  6.25 mg Per Tube BID WC  . chlorhexidine gluconate (MEDLINE KIT)  15 mL Mouth Rinse BID  . clonazePAM  0.5 mg Per Tube TID  . enoxaparin (LOVENOX) injection  40 mg Subcutaneous Q24H  . feeding supplement (PRO-STAT SUGAR FREE 64)  30 mL Per Tube BID  . folic acid  1 mg Per Tube Daily  . free water  150 mL Per Tube Q6H  . guaiFENesin  5 mL Per Tube Q6H  . [START ON 10/23/2018] influenza vaccine adjuvanted  0.5 mL Intramuscular Tomorrow-1000  . mouth rinse  15 mL Mouth  Rinse 10 times per day  . multivitamin  15 mL Per Tube Daily  . nutrition supplement (JUVEN)  1 packet Per Tube BID BM  . oxyCODONE  5 mg Per Tube Q6H  . pantoprazole sodium  40 mg Per Tube Q24H  . pregabalin  25 mg Per Tube BID  . QUEtiapine  25 mg Per Tube QHS  . thiamine  100 mg Per Tube Daily   Continuous Infusions: . sodium chloride Stopped (10/08/18 2212)  . feeding supplement (JEVITY 1.2  CAL) 1,000 mL (10/22/18 0300)    LOS: 44 days    If 7PM-7AM, please contact night-coverage [email or web address] Password [facility] 10/22/2018, 10:53 AM

## 2018-10-22 NOTE — Progress Notes (Signed)
Nutrition Follow-up  DOCUMENTATION CODES:   Non-severe (moderate) malnutrition in context of social or environmental circumstances  INTERVENTION:   Continue tube feeds via PEG: - Jevity 1.2 @ 60 ml/hr - Pro-stat 30 ml BID  Tube feeding regimen provides1928kcal, 110grams of protein, and 1151m of H2O.   - Continue free water flushes of 150 ml QID  - 1 packet Juven BIDper tube, each packet provides 95 calories, 2.5 grams of protein, and 9.8 grams of carbohydrate; also containsL-arginine and L-glutamine, vitamin C, vitamin E, vitamin B-12, zinc, calcium, and calcium Beta-hydroxy-Beta-methylbutyrate to support wound healing  NUTRITION DIAGNOSIS:   Moderate Malnutrition related to social / environmental circumstances (history of substance abuse) as evidenced by moderate muscle depletion, moderate fat depletion.  Ongoing, being addressed via TF  GOAL:   Patient will meet greater than or equal to 90% of their needs  Met via TF  MONITOR:   Labs, Weight trends, TF tolerance, Skin, I & O's  REASON FOR ASSESSMENT:   Consult Enteral/tube feeding initiation and management  ASSESSMENT:   68yo male admitted with PEA arrest after being found down in a parking lot. Urine screen positive for opiates, ETOH positive. PMH includes alcohol and opiate abuse, CAP.  8/17 - s/p trach, Cortrak placed 8/19 - s/p PEG placement 8/20 - TF held due to vomiting 8/21 - TF restarted at trickle 9/18 - episode of emesis  Pt remains medically stable for d/c. TLurline Idolis now uncuffed to make it easier to place pt at a facility.  Spoke with RN. Pt tolerating TF without issues at this time. No episodes of emesis since 9/18.  Weight remains stable.  Current TF: Jevity 1.2 @ 60 ml/hr, Pro-stat 30 ml BID, free water 150 ml q 6 hours  Medications reviewed and include: folic acid, liquid MVI, Juven BID, Protonix, thiamine  Labs reviewed.  Diet Order:   Diet Order    None      EDUCATION  NEEDS:   No education needs have been identified at this time  Skin:  Skin Assessment: Skin Integrity Issues: Skin Integrity Issues: DTI: right leg Stage II: left heel  Last BM:  10/22/18  Height:   Ht Readings from Last 1 Encounters:  09/08/18 _0  (1.727 m)    Weight:   Wt Readings from Last 1 Encounters:  10/22/18 60.8 kg    Ideal Body Weight:  70 kg  BMI:  Body mass index is 20.38 kg/m.  Estimated Nutritional Needs:   Kcal:  1750-1950 kcal  Protein:  105-120 grams  Fluid:  >/= 1.7 L/day    KGaynell Face MS, RD, LDN Inpatient Clinical Dietitian Pager: [phone]/After Hours: [phone]

## 2018-10-23 LAB — COMPREHENSIVE METABOLIC PANEL
ALT: 23 U/L (ref 0–44)
AST: 30 U/L (ref 15–41)
Albumin: 2.8 g/dL — ABNORMAL LOW (ref 3.5–5.0)
Alkaline Phosphatase: 107 U/L (ref 38–126)
Anion gap: 8 (ref 5–15)
BUN: 21 mg/dL (ref 8–23)
CO2: 25 mmol/L (ref 22–32)
Calcium: 9 mg/dL (ref 8.9–10.3)
Chloride: 104 mmol/L (ref 98–111)
Creatinine, Ser: 0.84 mg/dL (ref 0.61–1.24)
GFR calc Af Amer: >60 mL/min (ref >60)
GFR calc non Af Amer: >60 mL/min (ref >60)
Glucose, Bld: 118 mg/dL — ABNORMAL HIGH (ref 70–99)
Potassium: 3.8 mmol/L (ref 3.5–5.1)
Sodium: 137 mmol/L (ref 135–145)
Total Bilirubin: 0.2 mg/dL — ABNORMAL LOW (ref 0.3–1.2)
Total Protein: 6.7 g/dL (ref 6.5–8.1)

## 2018-10-23 LAB — CBC
HCT: 34.8 % — ABNORMAL LOW (ref 39.0–52.0)
Hemoglobin: 11.4 g/dL — ABNORMAL LOW (ref 13.0–17.0)
MCH: 26.6 pg (ref 26.0–34.0)
MCHC: 32.8 g/dL (ref 30.0–36.0)
MCV: 81.3 fL (ref 80.0–100.0)
Platelets: 225 10*3/uL (ref 150–400)
RBC: 4.28 MIL/uL (ref 4.22–5.81)
RDW: 13.8 % (ref 11.5–15.5)
WBC: 8.4 10*3/uL (ref 4.0–10.5)
nRBC: 0 % (ref 0.0–0.2)

## 2018-10-23 LAB — GLUCOSE, CAPILLARY: Glucose-Capillary: 103 mg/dL — ABNORMAL HIGH (ref 70–99)

## 2018-10-23 NOTE — Progress Notes (Signed)
PROGRESS NOTE    Micheal Carey  XBJ:[id number] DOB: 1950/09/09 DOA: 09/08/2018 PCP: [name], MD   Brief Narrative:  Patient is a 68 year old male who was initially found in the parking lot with PEA arrest, unknown downtime requiring medical ventilation.  UDS was positive for opiates, alcohol level was elevated at 276.  He had prior history of COPD, alcohol abuse, chronic pain/opiate dependence.  Could not be extubated and was subsequently trached on 8/17.  Status post PEG on 8/19.  PCCM following and managing vent, sedation,  weaning trials.  Patient transfered to hospitalist team on 8/21. Patient has prolonged hospitalization. 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  Social worker following for disposition: LTAC versus SNF.  LTAC placement is difficult because of disposition problem after he finishes LTAC stay.  Patient did not qualify for skilled nursing facility due to insurance issues.  SW checking long-term bed availability at a nursing facility.  Patient is medically stable for discharge as soon as placement is found. -[name] is uncuffed 10/19/18 to make it easier to place patient at facility  Assessment & Plan:   Principal Problem:   Cardiac arrest [facility]) Active Problems:   Alcohol abuse   COPD with acute exacerbation ([place])   Pneumonia   COPD (chronic obstructive pulmonary disease) ([place])   Sepsis ([place])   Drug overdose   Malnutrition of moderate degree   Pressure injury of skin   Tracheostomy status ([place])   Acute lower UTI   Anoxic encephalopathy ([facility])   Alcohol overdose   Dysphagia   Acute respiratory failure with hypoxia ([place])  Acute respiratory failure with hypoxia: Most likely secondary to opiate/alcohol overdose in the background of COPD.  Initially intubated on presentation.  Trached on 09/16/18.  On trach collar weaning since 10/03/2018 and tolerating.  -Discussed  with Dr. Lamonte Sakai (PCCM)---  Will be easier to Place pt to Rehab if [name] is uncuffed, so RT switched to uncuffed trach on 10/19/2018  Doing well on trach collar - per PCCM, not candidate for decannulation or downsizing of trach due to anoxic encephalopathy  PEA Arrest with severe Anoxic Encephalopathy: PEA suspected secondary to overdose.  Status post trach, remains encephalopathic.  Continue supportive care.  Evaluated by neurology earlier (per neuro note from 8/13, changes of meaningful neurological recovery are grim).  EEG/MRI  suggestive of severe anoxic brain injury.  Discussed with family about goals of care.  Family wants full aggressive scope of treatment.  Patient remains in vegetative state.  Pneumonia: Completed different  antibiotics course including Zosyn, cefepime, ceftriaxone, imipenem.  Blood cultures showed no growth .  Currently afebrile.  Sputum grew Acinetobacter and Citrobacter frenudii. Currently stable.  UTI with Enterobacter species/Citrobacter: Was treated with meropenem.  Completed antibiotics on 10/06/18.  Dysphagia/intermittent episodes of vomiting/FEN: Status post PEG on 09/18/18.  Having bowel movements .  Diarrhea has resolved.  Nutrition following -Continue Jevity 1.2 at 60 mL an hour -Continue water flushes at 150 mL every 6 hours  Hyponatremia/hypokalemia: Resolved  Hypertension: Blood pressure was has been stable.  Continue coreg  Chronic alcohol abuse: Continue thiamine and folic acid  COPD:-Stable after treatment with steroids, antibiotics.  continue bronchodilators as needed.  Pressure injuries  of heel/upper back: not severe, Continue supportive care, wound care  Goals of care: poor prognosis with CNS encephalopathy based on EEG/MRI.  Several discussions held with family.  Family wants full scope of treatment.  Will need  SNF with trach  support.  Social worker/ case Freight forwarder following and working on placement.  Nutrition Problem: Moderate  Malnutrition Etiology: social / environmental circumstances(Hx substance abuse)   DVT prophylaxis: lovenox Code Status: full  Family Communication: none at bedside Disposition Plan: pending placement - LTACH vs SNF   Consultants:   PCCM  Neurology  Procedures:  EEG, MRI, intubation/extubation Tracheostomy PEG  Antimicrobials:  Anti-infectives (From admission, onward)   Start     Dose/Rate Route Frequency Ordered Stop   09/30/18 1400  meropenem (MERREM) 1 g in sodium chloride 0.9 % 100 mL IVPB     1 g 200 mL/hr over 30 Minutes Intravenous Every 8 hours 09/30/18 1119 10/07/18 0539   09/27/18 1200  ceFEPIme (MAXIPIME) 2 g in sodium chloride 0.9 % 100 mL IVPB  Status:  Discontinued     2 g 200 mL/hr over 30 Minutes Intravenous Every 8 hours 09/27/18 1133 09/30/18 1119   09/18/18 1330  ceFAZolin (ANCEF) IVPB 2g/100 mL premix     2 g 200 mL/hr over 30 Minutes Intravenous To Radiology 09/18/18 1323 09/18/18 1356   09/17/18 0900  ceFAZolin (ANCEF) IVPB 2g/100 mL premix     2 g 200 mL/hr over 30 Minutes Intravenous To Radiology 09/17/18 0849 09/18/18 0900   09/14/18 1000  cefTRIAXone (ROCEPHIN) 2 g in sodium chloride 0.9 % 100 mL IVPB  Status:  Discontinued     2 g 200 mL/hr over 30 Minutes Intravenous Every 24 hours 09/14/18 0919 09/16/18 1040   09/12/18 0000  vancomycin (VANCOCIN) 1,250 mg in sodium chloride 0.9 % 250 mL IVPB  Status:  Discontinued     1,250 mg 166.7 mL/hr over 90 Minutes Intravenous Every 24 hours 09/11/18 0044 09/11/18 1229   09/12/18 0000  vancomycin (VANCOCIN) IVPB 1000 mg/200 mL premix  Status:  Discontinued     1,000 mg 200 mL/hr over 60 Minutes Intravenous Every 24 hours 09/11/18 1229 09/13/18 1117   09/11/18 0800  piperacillin-tazobactam (ZOSYN) IVPB 3.375 g  Status:  Discontinued     3.375 g 12.5 mL/hr over 240 Minutes Intravenous Every 8 hours 09/11/18 0044 09/13/18 1117   09/11/18 0045  piperacillin-tazobactam (ZOSYN) IVPB 3.375 g     3.375 g 100  mL/hr over 30 Minutes Intravenous  Once 09/11/18 0040 09/11/18 0232   09/11/18 0045  vancomycin (VANCOCIN) 1,500 mg in sodium chloride 0.9 % 500 mL IVPB     1,500 mg 250 mL/hr over 120 Minutes Intravenous  Once 09/11/18 0040 09/11/18 0447     Subjective: Nonverbal.  Unable to communicate meaningfully.   Objective: Vitals:   10/23/18 0739 10/23/18 1040 10/23/18 1135 10/23/18 1500  BP:  137/77 137/77 119/70  Pulse: (!) 107 (!) 101 97 97  Resp: '16 17 15 17  '$ Temp:  100.1 F (37.8 C)  98.8 F (37.1 C)  TempSrc:  Axillary  Oral  SpO2: 98% 98% 98% 96%  Weight:      Height:        Intake/Output Summary (Last 24 hours) at 10/23/2018 1655 Last data filed at 10/23/2018 1400 Gross per 24 hour  Intake 0 ml  Output 1250 ml  Net -1250 ml   Filed Weights   10/21/18 0612 10/22/18 0500 10/23/18 0500  Weight: 60.7 kg 60.8 kg 60.2 kg    Examination:  General exam: Appears calm and comfortable  Respiratory system: Clear to auscultation. Respiratory effort normal.  Trach collar. Cardiovascular system: S1 & S2 heard, RRR.  Gastrointestinal system: Abdomen is  nondistended, soft and nontender. PEG in place. Central nervous system: Does not follow commands.  Moves extremities spontaneously.  Extremities: no LEE Skin: No rashes, lesions or ulcers  Data Reviewed: I have personally reviewed following labs and imaging studies  CBC: Recent Labs  Lab 10/17/18 0240 10/20/18 0214 10/23/18 0845  WBC 6.2 11.9* 8.4  NEUTROABS 3.5  --   --   HGB 11.8* 12.1* 11.4*  HCT 34.6* 36.5* 34.8*  MCV 82.4 83.1 81.3  PLT 218 186 570   Basic Metabolic Panel: Recent Labs  Lab 10/17/18 0240 10/20/18 0214 10/23/18 0845  NA 138 136 137  K 3.9 4.7 3.8  CL 105 106 104  CO2 25 20* 25  GLUCOSE 123* 125* 118*  BUN 25* 20 21  CREATININE 0.78 0.79 0.84  CALCIUM 9.2 9.2 9.0  MG  --  1.9  --    GFR: Estimated Creatinine Clearance: 73.7 mL/min (by C-G formula based on SCr of 0.84 mg/dL). Liver Function  Tests: Recent Labs  Lab 10/23/18 0845  AST 30  ALT 23  ALKPHOS 107  BILITOT 0.2*  PROT 6.7  ALBUMIN 2.8*   No results for input(s): LIPASE, AMYLASE in the last 168 hours. No results for input(s): AMMONIA in the last 168 hours. Coagulation Profile: No results for input(s): INR, PROTIME in the last 168 hours. Cardiac Enzymes: No results for input(s): CKTOTAL, CKMB, CKMBINDEX, TROPONINI in the last 168 hours. BNP (last 3 results) No results for input(s): PROBNP in the last 8760 hours. HbA1C: No results for input(s): HGBA1C in the last 72 hours. CBG: Recent Labs  Lab 10/18/18 2114 10/19/18 2059 10/20/18 2100 10/21/18 2126 10/22/18 2148  GLUCAP 123* 147* 138* 145* 128*   Lipid Profile: No results for input(s): CHOL, HDL, LDLCALC, TRIG, CHOLHDL, LDLDIRECT in the last 72 hours. Thyroid Function Tests: No results for input(s): TSH, T4TOTAL, FREET4, T3FREE, THYROIDAB in the last 72 hours. Anemia Panel: No results for input(s): VITAMINB12, FOLATE, FERRITIN, TIBC, IRON, RETICCTPCT in the last 72 hours. Sepsis Labs: No results for input(s): PROCALCITON, LATICACIDVEN in the last 168 hours.  No results found for this or any previous visit (from the past 240 hour(s)).       Radiology Studies: No results found.      Scheduled Meds: . carvedilol  6.25 mg Per Tube BID WC  . chlorhexidine gluconate (MEDLINE KIT)  15 mL Mouth Rinse BID  . clonazePAM  0.5 mg Per Tube TID  . enoxaparin (LOVENOX) injection  40 mg Subcutaneous Q24H  . feeding supplement (PRO-STAT SUGAR FREE 64)  30 mL Per Tube BID  . folic acid  1 mg Per Tube Daily  . free water  150 mL Per Tube Q6H  . guaiFENesin  5 mL Per Tube Q6H  . mouth rinse  15 mL Mouth Rinse 10 times per day  . multivitamin  15 mL Per Tube Daily  . nutrition supplement (JUVEN)  1 packet Per Tube BID BM  . oxyCODONE  5 mg Per Tube Q6H  . pantoprazole sodium  40 mg Per Tube Q24H  . pregabalin  25 mg Per Tube BID  . QUEtiapine  25 mg  Per Tube QHS  . thiamine  100 mg Per Tube Daily   Continuous Infusions: . sodium chloride Stopped (10/08/18 2212)  . feeding supplement (JEVITY 1.2 CAL) 1,000 mL (10/22/18 0300)     LOS: 45 days    Time spent: over 30 min    Fayrene Helper, MD [facility]  Pager AMION  If 7PM-7AM, please contact night-coverage [email or web address] Password [facility] 10/23/2018, 4:55 PM

## 2018-10-23 NOTE — TOC Progression Note (Signed)
Transition of Care [facility]) - Progression Note    Patient Details  Name: Micheal Carey MRN: [id number] Date of Birth: 12-11-1950  [facility] CM/SW [place], [place] Phone Number: [phone] 10/23/2018, 9:44 AM  Clinical Narrative:     CSW has followed up multiple times with SNFs to inform that patient is now uncuffed trach. Following SNFs have confirmed declining patient :  [place]  CSW has reached out to [facility] they are reviewing patient's chart and report having long term bed availability. Awaiting call back for their decision at this time.   Expected Discharge Plan: Long Term Nursing Home Barriers to Discharge: No SNF bed  Expected Discharge Plan and Services Expected Discharge Plan: [place]     Post Acute Care Choice: Long Term Acute Care (LTAC) Living arrangements for the past 2 months: Single Family Home                                       Social Determinants of Health (SDOH) Interventions    Readmission Risk Interventions No flowsheet data found.

## 2018-10-24 LAB — GLUCOSE, CAPILLARY: Glucose-Capillary: 134 mg/dL — ABNORMAL HIGH (ref 70–99)

## 2018-10-24 NOTE — Progress Notes (Signed)
Pt given prn ativan 0.5 mg for agitation.

## 2018-10-24 NOTE — Progress Notes (Signed)
PROGRESS NOTE    Micheal Carey  XBJ:[id number] DOB: 1950/09/09 DOA: 09/08/2018 PCP: [name], MD   Brief Narrative:  Patient is Micheal Carey 68 year old male who was initially found in the parking lot with PEA arrest, unknown downtime requiring medical ventilation.  UDS was positive for opiates, alcohol level was elevated at 276.  He had prior history of COPD, alcohol abuse, chronic pain/opiate dependence.  Could not be extubated and was subsequently trached on 8/17.  Status post PEG on 8/19.  PCCM following and managing vent, sedation,  weaning trials.  Patient transfered to hospitalist team on 8/21. Patient has prolonged hospitalization. 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  Social worker following for disposition: LTAC versus SNF.  LTAC placement is difficult because of disposition problem after he finishes LTAC stay.  Patient did not qualify for skilled nursing facility due to insurance issues.  SW checking [facility].  Patient is medically stable for discharge as soon as placement is found. -[name] is uncuffed 10/19/18 to make it easier to place patient at facility  Assessment & Plan:   Principal Problem:   Cardiac arrest [facility]) Active Problems:   Alcohol abuse   COPD with acute exacerbation ([place])   Pneumonia   COPD (chronic obstructive pulmonary disease) ([place])   Sepsis ([place])   Drug overdose   Malnutrition of moderate degree   Pressure injury of skin   Tracheostomy status ([place])   Acute lower UTI   Anoxic encephalopathy (HCC)   Alcohol overdose   Dysphagia   Acute respiratory failure with hypoxia ([place])  Acute respiratory failure with hypoxia: Most likely secondary to opiate/alcohol overdose in the background of COPD.  Initially intubated on presentation.  Trached on 09/16/18.  On trach collar weaning since 10/03/2018 and tolerating.  -Discussed  with Dr. Lamonte Sakai (PCCM)---  Will be easier to Place pt to Rehab if [name] is uncuffed, so RT switched to uncuffed trach on 10/19/2018  Doing well on trach collar - per PCCM, not candidate for decannulation or downsizing of trach due to anoxic encephalopathy  PEA Arrest with severe Anoxic Encephalopathy: PEA suspected secondary to overdose.  Status post trach, remains encephalopathic.  Continue supportive care.  Evaluated by neurology earlier (per neuro note from 8/13, changes of meaningful neurological recovery are grim).  EEG/MRI  suggestive of severe anoxic brain injury.  Discussed with family about goals of care.  Family wants full aggressive scope of treatment.  Patient remains in vegetative state.  Pneumonia: Completed different  antibiotics course including Zosyn, cefepime, ceftriaxone, imipenem.  Blood cultures showed no growth .  Currently afebrile.  Sputum grew Acinetobacter and Citrobacter frenudii. Currently stable.  UTI with Enterobacter species/Citrobacter: Was treated with meropenem.  Completed antibiotics on 10/06/18.  Dysphagia/intermittent episodes of vomiting/FEN: Status post PEG on 09/18/18.  Having bowel movements .  Diarrhea has resolved.  Nutrition following -Continue Jevity 1.2 at 60 mL an hour -Continue water flushes at 150 mL every 6 hours  Hyponatremia/hypokalemia: Resolved  Hypertension: Blood pressure was has been stable.  Continue coreg  Chronic alcohol abuse: Continue thiamine and folic acid  COPD:-Stable after treatment with steroids, antibiotics.  continue bronchodilators as needed.  Pressure injuries  of heel/upper back: not severe, Continue supportive care, wound care  Goals of care: poor prognosis with CNS encephalopathy based on EEG/MRI.  Several discussions held with family.  Family wants full scope of treatment.  Will need  SNF with trach  support.  Social worker/ case Freight forwarder following and working on placement.  Nutrition Problem: Moderate  Malnutrition Etiology: social / environmental circumstances(Hx substance abuse)   DVT prophylaxis: lovenox Code Status: full  Family Communication: none at bedside Disposition Plan: pending placement - LTACH vs SNF   Consultants:   PCCM  Neurology  Procedures:  EEG, MRI, intubation/extubation Tracheostomy PEG  Antimicrobials:  Anti-infectives (From admission, onward)   Start     Dose/Rate Route Frequency Ordered Stop   09/30/18 1400  meropenem (MERREM) 1 g in sodium chloride 0.9 % 100 mL IVPB     1 g 200 mL/hr over 30 Minutes Intravenous Every 8 hours 09/30/18 1119 10/07/18 0539   09/27/18 1200  ceFEPIme (MAXIPIME) 2 g in sodium chloride 0.9 % 100 mL IVPB  Status:  Discontinued     2 g 200 mL/hr over 30 Minutes Intravenous Every 8 hours 09/27/18 1133 09/30/18 1119   09/18/18 1330  ceFAZolin (ANCEF) IVPB 2g/100 mL premix     2 g 200 mL/hr over 30 Minutes Intravenous To Radiology 09/18/18 1323 09/18/18 1356   09/17/18 0900  ceFAZolin (ANCEF) IVPB 2g/100 mL premix     2 g 200 mL/hr over 30 Minutes Intravenous To Radiology 09/17/18 0849 09/18/18 0900   09/14/18 1000  cefTRIAXone (ROCEPHIN) 2 g in sodium chloride 0.9 % 100 mL IVPB  Status:  Discontinued     2 g 200 mL/hr over 30 Minutes Intravenous Every 24 hours 09/14/18 0919 09/16/18 1040   09/12/18 0000  vancomycin (VANCOCIN) 1,250 mg in sodium chloride 0.9 % 250 mL IVPB  Status:  Discontinued     1,250 mg 166.7 mL/hr over 90 Minutes Intravenous Every 24 hours 09/11/18 0044 09/11/18 1229   09/12/18 0000  vancomycin (VANCOCIN) IVPB 1000 mg/200 mL premix  Status:  Discontinued     1,000 mg 200 mL/hr over 60 Minutes Intravenous Every 24 hours 09/11/18 1229 09/13/18 1117   09/11/18 0800  piperacillin-tazobactam (ZOSYN) IVPB 3.375 g  Status:  Discontinued     3.375 g 12.5 mL/hr over 240 Minutes Intravenous Every 8 hours 09/11/18 0044 09/13/18 1117   09/11/18 0045  piperacillin-tazobactam (ZOSYN) IVPB 3.375 g     3.375 g 100  mL/hr over 30 Minutes Intravenous  Once 09/11/18 0040 09/11/18 0232   09/11/18 0045  vancomycin (VANCOCIN) 1,500 mg in sodium chloride 0.9 % 500 mL IVPB     1,500 mg 250 mL/hr over 120 Minutes Intravenous  Once 09/11/18 0040 09/11/18 0447     Subjective: Nonverbal.  Does not meaningfully communicate with me.   Objective: Vitals:   10/24/18 0426 10/24/18 0713 10/24/18 1023 10/24/18 1041  BP:  (!) 141/82 (!) 147/81 (!) 150/87  Pulse: (!) 105 65 (!) 107 (!) 107  Resp: _0 Temp:  97.9 F (36.6 C)  98.4 F (36.9 C)  TempSrc:  Axillary  Axillary  SpO2:  100%  97%  Weight:      Height:        Intake/Output Summary (Last 24 hours) at 10/24/2018 1344 Last data filed at 10/24/2018 1200 Gross per 24 hour  Intake 120 ml  Output 1650 ml  Net -1530 ml   Filed Weights   10/21/18 0612 10/22/18 0500 10/23/18 0500  Weight: 60.7 kg 60.8 kg 60.2 kg    Examination:  General: No acute distress. Cardiovascular: Heart sounds show Gianella Chismar regular rate, and rhythm.  Lungs: Clear to auscultation bilaterally. Trach. Abdomen: Soft, nontender, nondistended.  PEG.  Neurological: moving all extremities.  Does not follow commands. Skin: Warm and dry. No rashes or lesions. Extremities: No clubbing or cyanosis. No edema.   Data Reviewed: I have personally reviewed following labs and imaging studies  CBC: Recent Labs  Lab 10/20/18 0214 10/23/18 0845  WBC 11.9* 8.4  HGB 12.1* 11.4*  HCT 36.5* 34.8*  MCV 83.1 81.3  PLT 186 700   Basic Metabolic Panel: Recent Labs  Lab 10/20/18 0214 10/23/18 0845  NA 136 137  K 4.7 3.8  CL 106 104  CO2 20* 25  GLUCOSE 125* 118*  BUN 20 21  CREATININE 0.79 0.84  CALCIUM 9.2 9.0  MG 1.9  --    GFR: Estimated Creatinine Clearance: 73.7 mL/min (by C-G formula based on SCr of 0.84 mg/dL). Liver Function Tests: Recent Labs  Lab 10/23/18 0845  AST 30  ALT 23  ALKPHOS 107  BILITOT 0.2*  PROT 6.7  ALBUMIN 2.8*   No results for input(s): LIPASE,  AMYLASE in the last 168 hours. No results for input(s): AMMONIA in the last 168 hours. Coagulation Profile: No results for input(s): INR, PROTIME in the last 168 hours. Cardiac Enzymes: No results for input(s): CKTOTAL, CKMB, CKMBINDEX, TROPONINI in the last 168 hours. BNP (last 3 results) No results for input(s): PROBNP in the last 8760 hours. HbA1C: No results for input(s): HGBA1C in the last 72 hours. CBG: Recent Labs  Lab 10/19/18 2059 10/20/18 2100 10/21/18 2126 10/22/18 2148 10/23/18 2126  GLUCAP 147* 138* 145* 128* 103*   Lipid Profile: No results for input(s): CHOL, HDL, LDLCALC, TRIG, CHOLHDL, LDLDIRECT in the last 72 hours. Thyroid Function Tests: No results for input(s): TSH, T4TOTAL, FREET4, T3FREE, THYROIDAB in the last 72 hours. Anemia Panel: No results for input(s): VITAMINB12, FOLATE, FERRITIN, TIBC, IRON, RETICCTPCT in the last 72 hours. Sepsis Labs: No results for input(s): PROCALCITON, LATICACIDVEN in the last 168 hours.  No results found for this or any previous visit (from the past 240 hour(s)).       Radiology Studies: No results found.      Scheduled Meds: . carvedilol  6.25 mg Per Tube BID WC  . chlorhexidine gluconate (MEDLINE KIT)  15 mL Mouth Rinse BID  . clonazePAM  0.5 mg Per Tube TID  . enoxaparin (LOVENOX) injection  40 mg Subcutaneous Q24H  . feeding supplement (PRO-STAT SUGAR FREE 64)  30 mL Per Tube BID  . folic acid  1 mg Per Tube Daily  . free water  150 mL Per Tube Q6H  . guaiFENesin  5 mL Per Tube Q6H  . mouth rinse  15 mL Mouth Rinse 10 times per day  . multivitamin  15 mL Per Tube Daily  . nutrition supplement (JUVEN)  1 packet Per Tube BID BM  . oxyCODONE  5 mg Per Tube Q6H  . pantoprazole sodium  40 mg Per Tube Q24H  . pregabalin  25 mg Per Tube BID  . QUEtiapine  25 mg Per Tube QHS  . thiamine  100 mg Per Tube Daily   Continuous Infusions: . sodium chloride Stopped (10/08/18 2212)  . feeding supplement (JEVITY  1.2 CAL) 1,000 mL (10/22/18 0300)     LOS: 46 days    Time spent: over 30 min    Fayrene Helper, MD [facility] Pager AMION  If 7PM-7AM, please contact night-coverage [email or web address] Password [email or web address] 10/24/2018, 1:44 PM

## 2018-10-25 ENCOUNTER — Inpatient Hospital Stay (HOSPITAL_COMMUNITY): Payer: Medicare Other

## 2018-10-25 LAB — BASIC METABOLIC PANEL
Anion gap: 6 (ref 5–15)
BUN: 18 mg/dL (ref 8–23)
CO2: 26 mmol/L (ref 22–32)
Calcium: 9.4 mg/dL (ref 8.9–10.3)
Chloride: 105 mmol/L (ref 98–111)
Creatinine, Ser: 0.77 mg/dL (ref 0.61–1.24)
GFR calc Af Amer: >60 mL/min (ref >60)
GFR calc non Af Amer: >60 mL/min (ref >60)
Glucose, Bld: 116 mg/dL — ABNORMAL HIGH (ref 70–99)
Potassium: 4.2 mmol/L (ref 3.5–5.1)
Sodium: 137 mmol/L (ref 135–145)

## 2018-10-25 LAB — CBC
HCT: 38.8 % — ABNORMAL LOW (ref 39.0–52.0)
Hemoglobin: 12.5 g/dL — ABNORMAL LOW (ref 13.0–17.0)
MCH: 26.1 pg (ref 26.0–34.0)
MCHC: 32.2 g/dL (ref 30.0–36.0)
MCV: 81 fL (ref 80.0–100.0)
Platelets: 267 10*3/uL (ref 150–400)
RBC: 4.79 MIL/uL (ref 4.22–5.81)
RDW: 13.7 % (ref 11.5–15.5)
WBC: 7.7 10*3/uL (ref 4.0–10.5)
nRBC: 0 % (ref 0.0–0.2)

## 2018-10-25 LAB — URINALYSIS, ROUTINE W REFLEX MICROSCOPIC
Bilirubin Urine: NEGATIVE
Glucose, UA: NEGATIVE mg/dL
Hgb urine dipstick: NEGATIVE
Ketones, ur: NEGATIVE mg/dL
Leukocytes,Ua: NEGATIVE
Nitrite: NEGATIVE
Protein, ur: NEGATIVE mg/dL
Specific Gravity, Urine: 1.015 (ref 1.005–1.030)
pH: 6 (ref 5.0–8.0)

## 2018-10-25 LAB — GLUCOSE, CAPILLARY: Glucose-Capillary: 166 mg/dL — ABNORMAL HIGH (ref 70–99)

## 2018-10-25 LAB — PROCALCITONIN: Procalcitonin: <0.1 ng/mL

## 2018-10-25 MED ORDER — CHLORHEXIDINE GLUCONATE 0.12 % MT SOLN
15.0000 mL | Freq: Two times a day (BID) | OROMUCOSAL | Status: DC
  Administered 2018-10-26 – 2018-11-09 (×29): 15 mL via OROMUCOSAL
  Filled 2018-10-25 (×26): qty 15

## 2018-10-25 MED ORDER — ORAL CARE MOUTH RINSE
15.0000 mL | Freq: Two times a day (BID) | OROMUCOSAL | Status: DC
  Administered 2018-10-26 – 2018-11-09 (×25): 15 mL via OROMUCOSAL

## 2018-10-25 NOTE — Progress Notes (Addendum)
PROGRESS NOTE    Micheal Carey  MCN:[id number] DOB: [birth year] DOA: 09/08/2018 PCP: [name], MD   Brief Narrative:  Patient is Micheal Carey 68 year old male who was initially found in the parking lot with PEA arrest, unknown downtime requiring medical ventilation.  UDS was positive for opiates, alcohol level was elevated at 276.  He had prior history of COPD, alcohol abuse, chronic pain/opiate dependence.  Could not be extubated and was subsequently trached on 8/17.  Status post PEG on 8/19.  PCCM following and managing vent, sedation,  weaning trials.  Patient transfered to hospitalist team on 8/21. Patient has prolonged hospitalization. 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  Social worker following for disposition: LTAC versus SNF.  LTAC placement is difficult because of disposition problem after he finishes LTAC stay.  Patient did not qualify for skilled nursing facility due to insurance issues.  SW checking long-term bed availability [facility].  Patient is medically stable for discharge as soon as placement is found. -[name] is uncuffed 10/19/18 to make it easier to place patient at facility  Assessment & Plan:   Principal Problem:   Cardiac arrest [facility]) Active Problems:   Alcohol abuse   COPD with acute exacerbation ([place])   Pneumonia   COPD (chronic obstructive pulmonary disease) ([place])   Sepsis ([place])   Drug overdose   Malnutrition of moderate degree   Pressure injury of skin   Tracheostomy status ([place])   Acute lower UTI   Anoxic encephalopathy (HCC)   Alcohol overdose   Dysphagia   Acute respiratory failure with hypoxia ([place])  Elevated Temperature  Concern for Recurrent Fevers: temp of 100 x 2 over past 24 hours.  On 9/23 he had temp of 100.1.  WBC is wnl.  Check procalcitonin.  Follow blood and urine and CXR. Hold on abx for now Addendum: UA clean, CXR without  active disease.  Blood cx pending.  PCT <0.1  Will continue to monitor off abx.  Acute respiratory failure with hypoxia: Most likely secondary to opiate/alcohol overdose in the background of COPD.  Initially intubated on presentation.  Trached on 09/16/18.  On trach collar weaning since 10/03/2018 and tolerating.  -Discussed with Dr. Lamonte Sakai (PCCM)---  Will be easier to Place pt to Rehab if [name] is uncuffed, so RT switched to uncuffed trach on 10/19/2018  Doing well on trach collar - per PCCM, not candidate for decannulation or downsizing of trach due to anoxic encephalopathy  PEA Arrest with severe Anoxic Encephalopathy: PEA suspected secondary to overdose.  Status post trach, remains encephalopathic.  Continue supportive care.  Evaluated by neurology earlier (per neuro note from 8/13, changes of meaningful neurological recovery are grim).  EEG/MRI  suggestive of severe anoxic brain injury.  Discussed with family about goals of care.  Family wants full aggressive scope of treatment.  Patient remains in vegetative state.  Pneumonia: Completed different  antibiotics course including Zosyn, cefepime, ceftriaxone, imipenem.  Blood cultures showed no growth .  Currently afebrile.  Sputum grew Acinetobacter and Citrobacter frenudii. Currently stable.  UTI with Enterobacter species/Citrobacter: Was treated with meropenem.  Completed antibiotics on 10/06/18.  Dysphagia/intermittent episodes of vomiting/FEN: Status post PEG on 09/18/18.  Having bowel movements .  Diarrhea has resolved.  Nutrition following -Continue Jevity 1.2 at 60 mL an hour -Continue water flushes at 150 mL every 6 hours  Hyponatremia/hypokalemia: Resolved  Hypertension: Blood pressure was has been stable.  Continue coreg  Chronic alcohol abuse: Continue thiamine and folic acid  COPD:-Stable after treatment with steroids, antibiotics.  continue bronchodilators as needed.  Pressure injuries  of heel/upper back: not severe,  Continue supportive care, wound care  Goals of care: poor prognosis with CNS encephalopathy based on EEG/MRI.  Several discussions held with family.  Family wants full scope of treatment.  Will need SNF with trach  support.  Social worker/ case Freight forwarder following and working on placement.  Nutrition Problem: Moderate Malnutrition Etiology: social / environmental circumstances(Hx substance abuse)   DVT prophylaxis: lovenox Code Status: full  Family Communication: none at bedside Disposition Plan: pending placement - LTACH vs SNF   Consultants:   PCCM  Neurology  Procedures:  EEG, MRI, intubation/extubation Tracheostomy PEG  Antimicrobials:  Anti-infectives (From admission, onward)   Start     Dose/Rate Route Frequency Ordered Stop   09/30/18 1400  meropenem (MERREM) 1 g in sodium chloride 0.9 % 100 mL IVPB     1 g 200 mL/hr over 30 Minutes Intravenous Every 8 hours 09/30/18 1119 10/07/18 0539   09/27/18 1200  ceFEPIme (MAXIPIME) 2 g in sodium chloride 0.9 % 100 mL IVPB  Status:  Discontinued     2 g 200 mL/hr over 30 Minutes Intravenous Every 8 hours 09/27/18 1133 09/30/18 1119   09/18/18 1330  ceFAZolin (ANCEF) IVPB 2g/100 mL premix     2 g 200 mL/hr over 30 Minutes Intravenous To Radiology 09/18/18 1323 09/18/18 1356   09/17/18 0900  ceFAZolin (ANCEF) IVPB 2g/100 mL premix     2 g 200 mL/hr over 30 Minutes Intravenous To Radiology 09/17/18 0849 09/18/18 0900   09/14/18 1000  cefTRIAXone (ROCEPHIN) 2 g in sodium chloride 0.9 % 100 mL IVPB  Status:  Discontinued     2 g 200 mL/hr over 30 Minutes Intravenous Every 24 hours 09/14/18 0919 09/16/18 1040   09/12/18 0000  vancomycin (VANCOCIN) 1,250 mg in sodium chloride 0.9 % 250 mL IVPB  Status:  Discontinued     1,250 mg 166.7 mL/hr over 90 Minutes Intravenous Every 24 hours 09/11/18 0044 09/11/18 1229   09/12/18 0000  vancomycin (VANCOCIN) IVPB 1000 mg/200 mL premix  Status:  Discontinued     1,000 mg 200 mL/hr over 60  Minutes Intravenous Every 24 hours 09/11/18 1229 09/13/18 1117   09/11/18 0800  piperacillin-tazobactam (ZOSYN) IVPB 3.375 g  Status:  Discontinued     3.375 g 12.5 mL/hr over 240 Minutes Intravenous Every 8 hours 09/11/18 0044 09/13/18 1117   09/11/18 0045  piperacillin-tazobactam (ZOSYN) IVPB 3.375 g     3.375 g 100 mL/hr over 30 Minutes Intravenous  Once 09/11/18 0040 09/11/18 0232   09/11/18 0045  vancomycin (VANCOCIN) 1,500 mg in sodium chloride 0.9 % 500 mL IVPB     1,500 mg 250 mL/hr over 120 Minutes Intravenous  Once 09/11/18 0040 09/11/18 0447     Subjective: Nonverbal.  No meaningful communication.  Objective: Vitals:   10/25/18 0311 10/25/18 0355 10/25/18 0400 10/25/18 0710  BP:  132/84  132/86  Pulse: (!) 105 100 99 (!) 105  Resp: '20 19 19 ' (!) 21  Temp:  100 F (37.8 C)  99.3 F (37.4 C)  TempSrc:  Axillary  Axillary  SpO2: 100% 100% 100% 98%  Weight:      Height:        Intake/Output Summary (Last 24 hours) at 10/25/2018 0807 Last data filed at 10/25/2018 0631 Gross per 24 hour  Intake 3121  ml  Output 2400 ml  Net 721 ml   Filed Weights   10/22/18 0500 10/23/18 0500 10/25/18 0011  Weight: 60.8 kg 60.2 kg 63.3 kg    Examination:  General: No acute distress. Cardiovascular: Heart sounds show Sandrina Heaton regular rate, and rhythm. Lungs: Clear to auscultation bilaterally. Trach. Abdomen: Soft, nontender, nondistended. PEG. Neurological: does not follow commands, moves all extremities. Skin: stage 2 decubitus to L heel does not appear infected Extremities: No clubbing or cyanosis. No edema.   Data Reviewed: I have personally reviewed following labs and imaging studies  CBC: Recent Labs  Lab 10/20/18 0214 10/23/18 0845 10/25/18 0729  WBC 11.9* 8.4 7.7  HGB 12.1* 11.4* 12.5*  HCT 36.5* 34.8* 38.8*  MCV 83.1 81.3 81.0  PLT 186 225 539   Basic Metabolic Panel: Recent Labs  Lab 10/20/18 0214 10/23/18 0845  NA 136 137  K 4.7 3.8  CL 106 104  CO2 20* 25   GLUCOSE 125* 118*  BUN 20 21  CREATININE 0.79 0.84  CALCIUM 9.2 9.0  MG 1.9  --    GFR: Estimated Creatinine Clearance: 77.5 mL/min (by C-G formula based on SCr of 0.84 mg/dL). Liver Function Tests: Recent Labs  Lab 10/23/18 0845  AST 30  ALT 23  ALKPHOS 107  BILITOT 0.2*  PROT 6.7  ALBUMIN 2.8*   No results for input(s): LIPASE, AMYLASE in the last 168 hours. No results for input(s): AMMONIA in the last 168 hours. Coagulation Profile: No results for input(s): INR, PROTIME in the last 168 hours. Cardiac Enzymes: No results for input(s): CKTOTAL, CKMB, CKMBINDEX, TROPONINI in the last 168 hours. BNP (last 3 results) No results for input(s): PROBNP in the last 8760 hours. HbA1C: No results for input(s): HGBA1C in the last 72 hours. CBG: Recent Labs  Lab 10/20/18 2100 10/21/18 2126 10/22/18 2148 10/23/18 2126 10/24/18 2152  GLUCAP 138* 145* 128* 103* 134*   Lipid Profile: No results for input(s): CHOL, HDL, LDLCALC, TRIG, CHOLHDL, LDLDIRECT in the last 72 hours. Thyroid Function Tests: No results for input(s): TSH, T4TOTAL, FREET4, T3FREE, THYROIDAB in the last 72 hours. Anemia Panel: No results for input(s): VITAMINB12, FOLATE, FERRITIN, TIBC, IRON, RETICCTPCT in the last 72 hours. Sepsis Labs: No results for input(s): PROCALCITON, LATICACIDVEN in the last 168 hours.  No results found for this or any previous visit (from the past 240 hour(s)).       Radiology Studies: No results found.      Scheduled Meds: . carvedilol  6.25 mg Per Tube BID WC  . chlorhexidine gluconate (MEDLINE KIT)  15 mL Mouth Rinse BID  . clonazePAM  0.5 mg Per Tube TID  . enoxaparin (LOVENOX) injection  40 mg Subcutaneous Q24H  . feeding supplement (PRO-STAT SUGAR FREE 64)  30 mL Per Tube BID  . folic acid  1 mg Per Tube Daily  . free water  150 mL Per Tube Q6H  . guaiFENesin  5 mL Per Tube Q6H  . mouth rinse  15 mL Mouth Rinse 10 times per day  . multivitamin  15 mL Per  Tube Daily  . nutrition supplement (JUVEN)  1 packet Per Tube BID BM  . oxyCODONE  5 mg Per Tube Q6H  . pantoprazole sodium  40 mg Per Tube Q24H  . pregabalin  25 mg Per Tube BID  . QUEtiapine  25 mg Per Tube QHS  . thiamine  100 mg Per Tube Daily   Continuous Infusions: . sodium chloride Stopped (10/08/18  2212)  . feeding supplement (JEVITY 1.2 CAL) 1,000 mL (10/25/18 0631)     LOS: 47 days    Time spent: over 30 min    Fayrene Helper, MD [facility] Pager AMION  If 7PM-7AM, please contact night-coverage [email or web address] Password [email or web address] 10/25/2018, 8:07 AM

## 2018-10-25 NOTE — TOC Progression Note (Signed)
Transition of Care [facility]) - Progression Note    Patient Details  Name: Micheal Carey MRN: [id number] Date of Birth: 1950-04-22  Transition of Care [facility]) CM/SW [place], [place] Phone Number: 10/25/2018, 1:38 PM  Clinical Narrative:     [facility] has also declined patient at this time. CSW has reached out to [facility], they are currently reviewing patient's chart for any of their locations.     Expected Discharge Plan: Long Term Nursing Home Barriers to Discharge: No SNF bed  Expected Discharge Plan and Services Expected Discharge Plan: [place]     Post Acute Care Choice: Long Term Acute Care (LTAC) Living arrangements for the past 2 months: Single Family Home                                       Social Determinants of Health (SDOH) Interventions    Readmission Risk Interventions No flowsheet data found.

## 2018-10-25 NOTE — Plan of Care (Signed)

## 2018-10-26 ENCOUNTER — Inpatient Hospital Stay (HOSPITAL_COMMUNITY): Payer: Medicare Other

## 2018-10-26 LAB — COMPREHENSIVE METABOLIC PANEL
ALT: 21 U/L (ref 0–44)
AST: 27 U/L (ref 15–41)
Albumin: 2.7 g/dL — ABNORMAL LOW (ref 3.5–5.0)
Alkaline Phosphatase: 101 U/L (ref 38–126)
Anion gap: 9 (ref 5–15)
BUN: 28 mg/dL — ABNORMAL HIGH (ref 8–23)
CO2: 24 mmol/L (ref 22–32)
Calcium: 9 mg/dL (ref 8.9–10.3)
Chloride: 105 mmol/L (ref 98–111)
Creatinine, Ser: 0.81 mg/dL (ref 0.61–1.24)
GFR calc Af Amer: >60 mL/min (ref >60)
GFR calc non Af Amer: >60 mL/min (ref >60)
Glucose, Bld: 105 mg/dL — ABNORMAL HIGH (ref 70–99)
Potassium: 4.2 mmol/L (ref 3.5–5.1)
Sodium: 138 mmol/L (ref 135–145)
Total Bilirubin: 0.3 mg/dL (ref 0.3–1.2)
Total Protein: 6.2 g/dL — ABNORMAL LOW (ref 6.5–8.1)

## 2018-10-26 LAB — CBC
HCT: 34.5 % — ABNORMAL LOW (ref 39.0–52.0)
Hemoglobin: 11.4 g/dL — ABNORMAL LOW (ref 13.0–17.0)
MCH: 26.7 pg (ref 26.0–34.0)
MCHC: 33 g/dL (ref 30.0–36.0)
MCV: 80.8 fL (ref 80.0–100.0)
Platelets: 243 10*3/uL (ref 150–400)
RBC: 4.27 MIL/uL (ref 4.22–5.81)
RDW: 14 % (ref 11.5–15.5)
WBC: 7.4 10*3/uL (ref 4.0–10.5)
nRBC: 0 % (ref 0.0–0.2)

## 2018-10-26 LAB — GLUCOSE, CAPILLARY: Glucose-Capillary: 121 mg/dL — ABNORMAL HIGH (ref 70–99)

## 2018-10-26 LAB — TROPONIN I (HIGH SENSITIVITY)
Troponin I (High Sensitivity): 5 ng/L (ref <18)
Troponin I (High Sensitivity): 6 ng/L (ref <18)

## 2018-10-26 LAB — PROCALCITONIN: Procalcitonin: <0.1 ng/mL

## 2018-10-26 LAB — MAGNESIUM: Magnesium: 1.9 mg/dL (ref 1.7–2.4)

## 2018-10-26 MED ORDER — CARVEDILOL 6.25 MG PO TABS
6.2500 mg | ORAL_TABLET | Freq: Once | ORAL | Status: AC
  Administered 2018-10-26: 6.25 mg via ORAL

## 2018-10-26 MED ORDER — FREE WATER
200.0000 mL | Freq: Four times a day (QID) | Status: DC
  Administered 2018-10-26 – 2018-11-09 (×58): 200 mL

## 2018-10-26 MED ORDER — OXYCODONE HCL 5 MG/5ML PO SOLN
5.0000 mg | Freq: Three times a day (TID) | ORAL | Status: DC | PRN
  Administered 2018-10-29 – 2018-11-04 (×6): 5 mg
  Filled 2018-10-26 (×7): qty 5

## 2018-10-26 MED ORDER — FENTANYL CITRATE (PF) 100 MCG/2ML IJ SOLN
25.0000 ug | INTRAMUSCULAR | Status: DC | PRN
  Administered 2018-11-08 – 2018-11-09 (×2): 25 ug via INTRAVENOUS
  Filled 2018-10-26 (×2): qty 2

## 2018-10-26 NOTE — Progress Notes (Signed)
Hospitalist progress note  Micheal Carey  RFX:[id number] DOB: 06/13/1950 DOA: 09/08/2018 PCP: Fleet Contras, MD  Narrative:  6 BM COPD "C" EtOH, CHR pain/opiates-admission [facility] 12/2015, 10/2015 EtOH abuse, aspiration multiple other reasons CV PEA arrest 09/08/2018-600 seconds status post epi X1-U tox + opiates, EtOH/AKI-on Levophed vent-trach 8/17 PEG 8/19-TRH assume care 8/21-trach uncuffed 9/19 Pending difficult placement per S/W  Assessment & Plan: Central fever? White count 7, blood culture 9/25 NGTD, UA and culture clean, CXR WNL-holding antibiotics, PCT less than 0.1-monitor trend-T-max 99 four 9/20 5 AM Hypoxic respiratory failure/Citrobacter Freundi/Acinetobacter PNA/UTI Status post PEG complicated by vomiting- 2/2 opiates/EtOH continue trach collar wean as tolerated-not a candidate for decannulation/downsizing 2/2 encephalopathy PEA arrest, vegetative state persistive Cut back fentanyl from higher dose arranged only 25 mcg every 30 minutes as needed, space out oxycodone from 5 mg iii/day prn no family at bedside developed ST segment elevations?? EEG/MRI = anoxic injury-neurology felt on 8/13 meaningful recovery is grim extra dose of Coreg 6.25 in addition to 6.25 twice daily, EKG my read sinus tach incomplete right bundle, elevations across precordium probably secondary to very thin habitus thin chest wall and I do not see any damage pattern compared to EKG from prior 9/17 only change is increase in rate Hyponatremia, hypokalemia Mild AKI Increase free water 150-200, given BUN 18-->28 COPD Stable Chronic pain, chronic EtOH Decubitus ulcers heel/back  Ethics it is been documented several times that patient has poor outcome and prognosis remains very grim-I have consulted palliative care to come and have difficult discussions with family I do think that the care that we pursue in terms of a curative goal remain futile and unless he makes a meaningful recovery it is not in the patient's  best interest to continue flagging him with therapeutic measures if he is not responding-it may be also reasonable to ask neurology to come back and revisit things from their perspective.  DVT prophylaxis: Lovenox code Status:   Full   family Communication:   No family at the bedside disposition Plan: Skilled facility when able   Consultants:   Multiple  Procedures:   Multiple  Antimicrobials:   None  Subjective: Patient has writhing motions to upper extremities is in bilateral splints for resting tone rides and does not make any meaningful verbalizations he is able to track with his eyes but I cannot get any meaningful ROS Nursing informed me that on the monitors the patient has ST elevations of about 2 mm I reviewed the strips personally and got an EKG as above-I do not think he is having an MI  Objective: Vitals:   10/26/18 0015 10/26/18 0325 10/26/18 0748 10/26/18 0819  BP:  111/70  (!) 151/78  Pulse: (!) 103 95 (!) 103   Resp: 19 20 (!) 22   Temp:  99.4 F (37.4 C)  97.7 F (36.5 C)  TempSrc:  Axillary  Axillary  SpO2: 99% 98% 99%   Weight:      Height:        Intake/Output Summary (Last 24 hours) at 10/26/2018 1017 Last data filed at 10/26/2018 0929 Gross per 24 hour  Intake 1410 ml  Output 1725 ml  Net -315 ml   Filed Weights   10/22/18 0500 10/23/18 0500 10/25/18 0011  Weight: 60.8 kg 60.2 kg 63.3 kg    Examination: EOMI, decorticate posturing motions, meaningless responses although does react to pain, size 6 cuffless Shiley in place with sats in the 90s patient on 5 L 21 FiO2 Abdomen  soft scaphoid PEG tube in place in the left upper quadrant patient does have breakdown on the left heel stage II-III with denudation of the skin Prevalon boots are now on Chest is clear no added sound cannot appreciate JVD neurologically as above   Data Reviewed: I have personally reviewed following labs and imaging studies  CBC: Recent Labs  Lab 10/20/18 0214  10/23/18 0845 10/25/18 0729 10/26/18 0211  WBC 11.9* 8.4 7.7 7.4  HGB 12.1* 11.4* 12.5* 11.4*  HCT 36.5* 34.8* 38.8* 34.5*  MCV 83.1 81.3 81.0 80.8  PLT 186 225 267 696   Basic Metabolic Panel: Recent Labs  Lab 10/20/18 0214 10/23/18 0845 10/25/18 0729 10/26/18 0211  NA 136 137 137 138  K 4.7 3.8 4.2 4.2  CL 106 104 105 105  CO2 20* 25 26 24   GLUCOSE 125* 118* 116* 105*  BUN 20 21 18  28*  CREATININE 0.79 0.84 0.77 0.81  CALCIUM 9.2 9.0 9.4 9.0  MG 1.9  --   --  1.9   GFR: Estimated Creatinine Clearance: 80.3 mL/min (by C-G formula based on SCr of 0.81 mg/dL). Liver Function Tests: Recent Labs  Lab 10/23/18 0845 10/26/18 0211  AST 30 27  ALT 23 21  ALKPHOS 107 101  BILITOT 0.2* 0.3  PROT 6.7 6.2*  ALBUMIN 2.8* 2.7*   No results for input(s): LIPASE, AMYLASE in the last 168 hours. No results for input(s): AMMONIA in the last 168 hours. Coagulation Profile: No results for input(s): INR, PROTIME in the last 168 hours. Cardiac Enzymes:  Radiology Studies: Reviewed images personally in health database   Scheduled Meds: . carvedilol  6.25 mg Per Tube BID WC  . carvedilol  6.25 mg Oral Once  . chlorhexidine  15 mL Mouth Rinse BID  . clonazePAM  0.5 mg Per Tube TID  . enoxaparin (LOVENOX) injection  40 mg Subcutaneous Q24H  . feeding supplement (PRO-STAT SUGAR FREE 64)  30 mL Per Tube BID  . folic acid  1 mg Per Tube Daily  . free water  200 mL Per Tube Q6H  . guaiFENesin  5 mL Per Tube Q6H  . mouth rinse  15 mL Mouth Rinse q12n4p  . multivitamin  15 mL Per Tube Daily  . nutrition supplement (JUVEN)  1 packet Per Tube BID BM  . oxyCODONE  5 mg Per Tube Q6H  . pantoprazole sodium  40 mg Per Tube Q24H  . pregabalin  25 mg Per Tube BID  . QUEtiapine  25 mg Per Tube QHS  . thiamine  100 mg Per Tube Daily   Continuous Infusions: . sodium chloride Stopped (10/08/18 2212)  . feeding supplement (JEVITY 1.2 CAL) 1,000 mL (10/26/18 0328)     LOS: 48 days  Time  spent: [place], MD [facility]  If 7PM-7AM, please contact night-coverage-look on AMION to find my number otherwise-prefer pages-not epic chat,please 10/26/2018, 10:17 AM

## 2018-10-27 DIAGNOSIS — Z7189 Other specified counseling: Secondary | ICD-10-CM

## 2018-10-27 DIAGNOSIS — Z515 Encounter for palliative care: Secondary | ICD-10-CM

## 2018-10-27 LAB — BASIC METABOLIC PANEL
Anion gap: 10 (ref 5–15)
BUN: 26 mg/dL — ABNORMAL HIGH (ref 8–23)
CO2: 24 mmol/L (ref 22–32)
Calcium: 9.3 mg/dL (ref 8.9–10.3)
Chloride: 105 mmol/L (ref 98–111)
Creatinine, Ser: 0.82 mg/dL (ref 0.61–1.24)
GFR calc Af Amer: >60 mL/min (ref >60)
GFR calc non Af Amer: >60 mL/min (ref >60)
Glucose, Bld: 100 mg/dL — ABNORMAL HIGH (ref 70–99)
Potassium: 4.1 mmol/L (ref 3.5–5.1)
Sodium: 139 mmol/L (ref 135–145)

## 2018-10-27 LAB — CBC WITH DIFFERENTIAL/PLATELET
Abs Immature Granulocytes: 0.02 10*3/uL (ref 0.00–0.07)
Basophils Absolute: 0 10*3/uL (ref 0.0–0.1)
Basophils Relative: 0 %
Eosinophils Absolute: 0.5 10*3/uL (ref 0.0–0.5)
Eosinophils Relative: 7 %
HCT: 34 % — ABNORMAL LOW (ref 39.0–52.0)
Hemoglobin: 11.7 g/dL — ABNORMAL LOW (ref 13.0–17.0)
Immature Granulocytes: 0 %
Lymphocytes Relative: 21 %
Lymphs Abs: 1.4 10*3/uL (ref 0.7–4.0)
MCH: 27.8 pg (ref 26.0–34.0)
MCHC: 34.4 g/dL (ref 30.0–36.0)
MCV: 80.8 fL (ref 80.0–100.0)
Monocytes Absolute: 0.8 10*3/uL (ref 0.1–1.0)
Monocytes Relative: 12 %
Neutro Abs: 4 10*3/uL (ref 1.7–7.7)
Neutrophils Relative %: 60 %
Platelets: 265 10*3/uL (ref 150–400)
RBC: 4.21 MIL/uL — ABNORMAL LOW (ref 4.22–5.81)
RDW: 13.9 % (ref 11.5–15.5)
WBC: 6.7 10*3/uL (ref 4.0–10.5)
nRBC: 0 % (ref 0.0–0.2)

## 2018-10-27 LAB — GLUCOSE, CAPILLARY: Glucose-Capillary: 115 mg/dL — ABNORMAL HIGH (ref 70–99)

## 2018-10-27 LAB — PROCALCITONIN: Procalcitonin: <0.1 ng/mL

## 2018-10-27 NOTE — Progress Notes (Signed)
PROGRESS NOTE    Micheal Carey  SJG:[id number] DOB: [birth year] DOA: 09/08/2018 PCP: Fleet Contras, MD      Brief Narrative:  Patient is a 68 year old male who was initially found in the parking lot with PEA arrest, unknown downtime requiring medical ventilation. UDS was positive for opiates, alcohol level was elevated at 276. He had prior history of COPD, alcohol abuse, chronic pain/opiate dependence. Could not be extubated and was subsequently trached on 8/17. Status post PEG on 8/19. PCCM following and managing vent, sedation, weaning trials. Patient transfered to hospitalist team on 8/21. Patient has prolonged hospitalization. 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 Social worker following for disposition: LTAC versus SNF. LTAC placement is difficult because of disposition problem after he finishes LTAC stay. Patient did not qualify for skilled nursing facility due to insurance issues. SW checking long-term bed availability at a nursing facility. Patient is medically stable for discharge as soon as placement is found. -[name] is uncuffed 10/19/18 to make it easier to place patient at facility   Assessment & Plan:  S/p PEA arrest Persistent vegetative state S/p tracheostomy, PEG tube -Continue quetiapine, clonazepam, Lyrica -Continue tube feeds and free water -Consult CCM for trach mgmt   COPD Stable  Acute hypoxic respiratory failure Citrobacter/Acinetobacter pneumonia/UTI Resolved  Hypertension -Continue carvedilol  Stage II pressure injury, left heel, not POA       MDM and disposition: The below labs and imaging reports were reviewed and summarized above.  Medication management as above.  The patient was admitted with PEA arrest.  Now with persistent vegetative state.  He is PEG and trach dependent.  Efforts are ongoing to find a safe disposition.          DVT prophylaxis: Lovenox Code Status: FULL Family Communication:       Subjective: Patient stirs to touch but does not make eye contact.  Does not follow commands.  Makes no intelligible vocalizations.  Objective: Vitals:   10/27/18 0400 10/27/18 0900 10/27/18 0903 10/27/18 0919  BP: (!) 134/103  130/78   Pulse: 100  96 98  Resp: 18   (!) 23  Temp:  98.2 F (36.8 C)    TempSrc:  Axillary    SpO2: 99%   100%  Weight:      Height:        Intake/Output Summary (Last 24 hours) at 10/27/2018 1103 Last data filed at 10/27/2018 0900 Gross per 24 hour  Intake 2277 ml  Output 1275 ml  Net 1002 ml   Filed Weights   10/22/18 0500 10/23/18 0500 10/25/18 0011  Weight: 60.8 kg 60.2 kg 63.3 kg    Examination: General appearance:  adult male, eyes open, contractured lying on left side, no obvious distress.   Skin: Warm and dry.   Cardiac: RRR, nl S1-S2, no murmurs appreciated.  No LE edema.  Radial pulses 2+ and symmetric. Respiratory: Normal respiratory rate and rhythm.  CTAB without rales or wheezes. Abdomen: Abdomen soft.  No grimace to palpation, guarding noted. No ascites, distension, hepatosplenomegaly.   MSK: Diffuse loss of subcutaneous muscle mass and fat, contractures in all 4 extremities. Neuro: Stirs but does not follow commands, pupils equal.    Psych: Unable to assess.    Data Reviewed: I have personally reviewed following labs and imaging studies:  CBC: Recent Labs  Lab 10/23/18 0845 10/25/18 0729 10/26/18 0211 10/27/18 0304  WBC 8.4 7.7 7.4 6.7  NEUTROABS  --   --   --  4.0  HGB 11.4* 12.5* 11.4* 11.7*  HCT 34.8* 38.8* 34.5* 34.0*  MCV 81.3 81.0 80.8 80.8  PLT 225 267 243 265   Basic Metabolic Panel: Recent Labs  Lab 10/23/18 0845 10/25/18 0729 10/26/18 0211 10/27/18 0304  NA 137 137 138 139  K 3.8 4.2 4.2 4.1  CL 104 105 105 105  CO2 25 26 24 24   GLUCOSE 118* 116* 105* 100*  BUN 21 18 28* 26*  CREATININE 0.84 0.77 0.81 0.82   CALCIUM 9.0 9.4 9.0 9.3  MG  --   --  1.9  --    GFR: Estimated Creatinine Clearance: 79.3 mL/min (by C-G formula based on SCr of 0.82 mg/dL). Liver Function Tests: Recent Labs  Lab 10/23/18 0845 10/26/18 0211  AST 30 27  ALT 23 21  ALKPHOS 107 101  BILITOT 0.2* 0.3  PROT 6.7 6.2*  ALBUMIN 2.8* 2.7*   No results for input(s): LIPASE, AMYLASE in the last 168 hours. No results for input(s): AMMONIA in the last 168 hours. Coagulation Profile: No results for input(s): INR, PROTIME in the last 168 hours. Cardiac Enzymes: No results for input(s): CKTOTAL, CKMB, CKMBINDEX, TROPONINI in the last 168 hours. BNP (last 3 results) No results for input(s): PROBNP in the last 8760 hours. HbA1C: No results for input(s): HGBA1C in the last 72 hours. CBG: Recent Labs  Lab 10/22/18 2148 10/23/18 2126 10/24/18 2152 10/25/18 2105 10/26/18 2109  GLUCAP 128* 103* 134* 166* 121*   Lipid Profile: No results for input(s): CHOL, HDL, LDLCALC, TRIG, CHOLHDL, LDLDIRECT in the last 72 hours. Thyroid Function Tests: No results for input(s): TSH, T4TOTAL, FREET4, T3FREE, THYROIDAB in the last 72 hours. Anemia Panel: No results for input(s): VITAMINB12, FOLATE, FERRITIN, TIBC, IRON, RETICCTPCT in the last 72 hours. Urine analysis:    Component Value Date/Time   COLORURINE YELLOW 10/25/2018 1016   APPEARANCEUR CLEAR 10/25/2018 1016   LABSPEC 1.015 10/25/2018 1016   PHURINE 6.0 10/25/2018 1016   GLUCOSEU NEGATIVE 10/25/2018 1016   HGBUR NEGATIVE 10/25/2018 1016   BILIRUBINUR NEGATIVE 10/25/2018 1016   KETONESUR NEGATIVE 10/25/2018 1016   PROTEINUR NEGATIVE 10/25/2018 1016   NITRITE NEGATIVE 10/25/2018 1016   LEUKOCYTESUR NEGATIVE 10/25/2018 1016   Sepsis Labs: @LABRCNTIP (procalcitonin:4,lacticacidven:4)  ) Recent Results (from the past 240 hour(s))  Culture, blood (routine x 2)     Status: None (Preliminary result)   Collection Time: 10/25/18  8:25 AM   Specimen: BLOOD LEFT HAND   Result Value Ref Range Status   Specimen Description BLOOD LEFT HAND  Final   Special Requests   Final    BOTTLES DRAWN AEROBIC ONLY Blood Culture adequate volume   Culture   Final    NO GROWTH 2 DAYS Performed at [facility], [address] [place], [place] [address]    Report Status PENDING  Incomplete  Culture, blood (routine x 2)     Status: None (Preliminary result)   Collection Time: 10/25/18  8:36 AM   Specimen: BLOOD RIGHT HAND  Result Value Ref Range Status   Specimen Description BLOOD RIGHT HAND  Final   Special Requests   Final    BOTTLES DRAWN AEROBIC ONLY Blood Culture adequate volume   Culture   Final    NO GROWTH 2 DAYS Performed at [facility], [address] [place], [place] [address]    Report Status PENDING  Incomplete         Radiology Studies: [facility] [address]  Result Date: 10/26/2018 CLINICAL DATA:  Pneumonia, tracheostomy EXAM: PORTABLE CHEST 1 VIEW COMPARISON:  10/25/2018 FINDINGS: Unchanged AP portable examination with bandlike scarring or atelectasis of the right midlung. No new or acute appearing airspace opacity. Unchanged elevation of the left hemidiaphragm. Tracheostomy. Heart and mediastinum are unremarkable. IMPRESSION: Unchanged AP portable examination with bandlike scarring or atelectasis of the right midlung. No new or acute appearing airspace opacity. Unchanged elevation of the left hemidiaphragm. Tracheostomy. Electronically Signed   By: Eddie Candle M.D.   On: 10/26/2018 11:51        Scheduled Meds: . carvedilol  6.25 mg Per Tube BID WC  . chlorhexidine  15 mL Mouth Rinse BID  . clonazePAM  0.5 mg Per Tube TID  . enoxaparin (LOVENOX) injection  40 mg Subcutaneous Q24H  . feeding supplement (PRO-STAT SUGAR FREE 64)  30 mL Per Tube BID  . folic acid  1 mg Per Tube Daily  . free water  200 mL Per Tube Q6H  . guaiFENesin  5 mL Per Tube Q6H  . mouth rinse  15 mL Mouth Rinse q12n4p  . multivitamin  15 mL Per Tube  Daily  . nutrition supplement (JUVEN)  1 packet Per Tube BID BM  . pantoprazole sodium  40 mg Per Tube Q24H  . pregabalin  25 mg Per Tube BID  . QUEtiapine  25 mg Per Tube QHS  . thiamine  100 mg Per Tube Daily   Continuous Infusions: . sodium chloride Stopped (10/08/18 2212)  . feeding supplement (JEVITY 1.2 CAL) 1,000 mL (10/26/18 0328)     LOS: 49 days    Time spent: 15 minutes    Edwin Dada, MD [facility] 10/27/2018, 11:03 AM     Please page through [place]:  [email or web address] Password [email or web address] If 7PM-7AM, please contact night-coverage

## 2018-10-27 NOTE — Consult Note (Signed)
Consultation Note Date: 10/27/2018   Patient Name: Micheal Carey  DOB: 01/16/1951  MRN: [id number]  Age / Sex: 68 y.o., male  PCP: Fleet Contras, MD Referring Physician: Alberteen Sam, *  Reason for Consultation: Establishing goals of care  HPI/Patient Profile: 68 y.o. male  with past medical history of COPD, ETOH/Opioid dependence admitted on 09/08/2018 with PEA arrest presumed due to heroin overdose. During admission revived, but unable to liberate to from vent resulting in trach and PEG. Has additional residual cognitive and physical deficits leaving him bedbound, nonverbal, and limited meaningful interaction. He is now found to be medically stable and awaiting discharge once a bed at a SNF can be located. Palliative medicine consulted for additional GOC assistance.   Clinical Assessment and Goals of Care: Evaluated patient at bedside. He opened his eyes to my voice and made eye contact. There was limited movement of his R upper extremity. He was nonverbal. Did not follow commands.   I called and spoke with his son and daughter. Chy expressed frustration- stating that she felt that everytime she received a call from this hospital it was "someone calling to tell us to pull the plug". I introduced Palliative medicine. Palliative medicine is specialized medical care for people living with serious illness. It focuses on providing relief from the symptoms and stress of a serious illness. The goal is to improve quality of life for both the patient and the family. I offered support and to be a point of contact and advocate for family.   Chy and Jamal live in [place]. Their current GOC are for patient to discharge to SNF. I discussed with them that SNF placement is likely a permanent setting for their father. I briefly touched on code status but given Chy's resistance to engage in GOC discussion did not pursue  further- both Jamal and Chy verbalized they would consider a change in code status.   Chy requested coordination from nursing staff regarding video visits. Chy states that when they have called to request these video visits the RN is unavailable, or the ipad is not available, and they have not had a video visit in 2 weeks.   Primary Decision Maker NEXT OF KIN- patient's children- Jamaal and Chy    SUMMARY OF RECOMMENDATIONS -Continue current care- full scope, full code -Hopeful for d/c to SNF -Discussed patient's children's wishes for video visits with charge RN- Loree Fee who stated she would discuss with patient's RN in an attempt to arrange and include this in patient's daily plan of care    Code Status/Advance Care Planning:  Full code  Primary Diagnoses: Present on Admission: . Drug overdose . Cardiac arrest (HCC) . Pneumonia . Acute lower UTI . Alcohol overdose . COPD (chronic obstructive pulmonary disease) (HCC) . Alcohol abuse . COPD with acute exacerbation (HCC) . Sepsis (HCC) . Acute respiratory failure with hypoxia (HCC)   I have reviewed the medical record, interviewed the patient and family, and examined the patient. The following aspects are pertinent.  Past Medical  History:  Diagnosis Date  . CAP (community acquired pneumonia) 10/14/2015  . Cardiac arrest ([place]) 09/08/2018  . ETOH abuse   . Opioid abuse ([place])   . Pneumonia    "twice before" (10/14/2015)   Social History   Socioeconomic History  . Marital status: Married    Spouse name: Not on file  . Number of children: Not on file  . Years of education: Not on file  . Highest education level: Not on file  Occupational History  . Not on file  Social Needs  . Financial resource strain: Not on file  . Food insecurity    Worry: Not on file    Inability: Not on file  . Transportation needs    Medical: Not on file    Non-medical: Not on file  Tobacco Use  . Smoking status: Former Research scientist (life sciences)  .  Smokeless tobacco: Never Used  Substance and Sexual Activity  . Alcohol use: Yes    Comment: every day beer 4-5 cans   . Drug use: Yes    Types: Oxycodone  . Sexual activity: Not Currently  Lifestyle  . Physical activity    Days per week: Not on file    Minutes per session: Not on file  . Stress: Not on file  Relationships  . Social Herbalist on phone: Not on file    Gets together: Not on file    Attends religious service: Not on file    Active member of club or organization: Not on file    Attends meetings of clubs or organizations: Not on file    Relationship status: Not on file  Other Topics Concern  . Not on file  Social History Narrative  . Not on file   Family History  Problem Relation Age of Onset  . Diabetes Mother   . Cancer Mother   . Diabetes Father   . Stroke Neg Hx   . CAD Neg Hx    Scheduled Meds: . carvedilol  6.25 mg Per Tube BID WC  . chlorhexidine  15 mL Mouth Rinse BID  . clonazePAM  0.5 mg Per Tube TID  . enoxaparin (LOVENOX) injection  40 mg Subcutaneous Q24H  . feeding supplement (PRO-STAT SUGAR FREE 64)  30 mL Per Tube BID  . folic acid  1 mg Per Tube Daily  . free water  200 mL Per Tube Q6H  . guaiFENesin  5 mL Per Tube Q6H  . mouth rinse  15 mL Mouth Rinse q12n4p  . multivitamin  15 mL Per Tube Daily  . nutrition supplement (JUVEN)  1 packet Per Tube BID BM  . pantoprazole sodium  40 mg Per Tube Q24H  . pregabalin  25 mg Per Tube BID  . QUEtiapine  25 mg Per Tube QHS  . thiamine  100 mg Per Tube Daily   Continuous Infusions: . sodium chloride Stopped (10/08/18 2212)  . feeding supplement (JEVITY 1.2 CAL) 1,000 mL (10/26/18 0328)   PRN Meds:.sodium chloride, acetaminophen, albuterol, bisacodyl, docusate, fentaNYL (SUBLIMAZE) injection, Gerhardt's butt cream, LORazepam, ondansetron **OR** ondansetron (ZOFRAN) IV, oxyCODONE, pneumococcal 23 valent vaccine Medications Prior to Admission:  Prior to Admission medications    Medication Sig Start Date End Date Taking? Authorizing Provider  albuterol (PROVENTIL HFA;VENTOLIN HFA) 108 (90 Base) MCG/ACT inhaler Inhale 2 puffs into the lungs every 6 (six) hours as needed for wheezing or shortness of breath.    [provider]  albuterol (PROVENTIL) (2.5 MG/3ML) 0.083% nebulizer solution  Take 2.5 mg by nebulization every 6 (six) hours as needed for wheezing or shortness of breath.    [provider]  amLODipine (NORVASC) 5 MG tablet Take 5 mg by mouth daily. 08/16/17   [provider]  budesonide-formoterol (SYMBICORT) 160-4.5 MCG/ACT inhaler Inhale 2 puffs into the lungs 2 (two) times daily.    [provider]  BYSTOLIC 5 MG tablet Take 5 mg by mouth daily. 04/06/18   [provider]  Cholecalciferol (VITAMIN D PO) Take 1 tablet by mouth daily.    [provider]  cloNIDine (CATAPRES) 0.1 MG tablet Take 1 tablet (0.1 mg total) by mouth every 6 (six) hours for 3 days. 10/27/17 10/30/17  Renne CriglerGeiple, Joshua, PA-C  diclofenac sodium (VOLTAREN) 1 % GEL APPLY 4 GRAMS AA QID PRN FOR PAIN 04/05/18   [provider]  Multiple Vitamins-Minerals (MULTIVITAMIN WITH MINERALS) tablet Take 1 tablet by mouth daily.    [provider]  naproxen (NAPROSYN) 500 MG tablet TK 1 T PO BID WF 12/20/17   [provider]  oxyCODONE-acetaminophen (PERCOCET) 7.5-325 MG tablet Take 1 tablet by mouth See admin instructions. Five times daily as needed for pain 09/10/17   [provider]  potassium chloride SA (K-DUR,KLOR-CON) 20 MEQ tablet Take 20 mEq by mouth 2 (two) times daily.    [provider]  pregabalin (LYRICA) 100 MG capsule TK ONE C PO BID 11/14/17   [provider]  sildenafil (VIAGRA) 100 MG tablet Take 100 mg by mouth daily as needed for erectile dysfunction.    [provider]  tiZANidine (ZANAFLEX) 4 MG tablet TK 1 T PO BID PRN 03/06/18   [provider]   No Known Allergies  Review of Systems  Physical Exam  Vital Signs: BP 130/78   Pulse 98   Temp 98.2 F (36.8 C) (Axillary)   Resp (!) 23   Ht 5\' 8"  (1.727 m)   Wt 63.3 kg   SpO2 100%   BMI 21.22 kg/m  Pain Scale: Faces POSS *See Group Information*: 1-Acceptable,Awake and alert Pain Score: Asleep   SpO2: SpO2: 100 % O2 Device:SpO2: 100 % O2 Flow Rate: .O2 Flow Rate (L/min): 5 L/min  IO: Intake/output summary:   Intake/Output Summary (Last 24 hours) at 10/27/2018 1209 Last data filed at 10/27/2018 0900 Gross per 24 hour  Intake 2277 ml  Output 1275 ml  Net 1002 ml    LBM: Last BM Date: 10/27/18 Baseline Weight: Weight: 68 kg Most recent weight: Weight: 63.3 kg     Palliative Assessment/Data: PPS: 10%     Thank you for this consult. Palliative medicine will continue to follow and assist as needed.   Time In: 0930 Time Out: 1100 Time Total: 90 minutes Greater than 50%  of this time was spent counseling and coordinating care related to the above assessment and plan.  Signed by: Ocie BobKasie Jayion Schneck, AGNP-C Palliative Medicine    Please contact Palliative Medicine Team phone at [phone] for questions and concerns.  For individual provider: See Loretha StaplerAmion

## 2018-10-28 DIAGNOSIS — R509 Fever, unspecified: Secondary | ICD-10-CM

## 2018-10-28 DIAGNOSIS — I10 Essential (primary) hypertension: Secondary | ICD-10-CM

## 2018-10-28 LAB — GLUCOSE, CAPILLARY: Glucose-Capillary: 118 mg/dL — ABNORMAL HIGH (ref 70–99)

## 2018-10-28 MED ORDER — GERHARDT'S BUTT CREAM
TOPICAL_CREAM | Freq: Four times a day (QID) | CUTANEOUS | Status: DC
  Administered 2018-10-28 – 2018-10-30 (×8): via TOPICAL
  Administered 2018-10-31 (×2): 1 via TOPICAL
  Administered 2018-10-31 – 2018-11-06 (×23): via TOPICAL
  Administered 2018-11-06: 1 via TOPICAL
  Administered 2018-11-07: 14:00:00 via TOPICAL
  Administered 2018-11-07: 1 via TOPICAL
  Administered 2018-11-07 – 2018-11-09 (×7): via TOPICAL
  Filled 2018-10-28: qty 1

## 2018-10-28 NOTE — Progress Notes (Signed)
Nutrition Follow-up  RD working remotely.  DOCUMENTATION CODES:   Non-severe (moderate) malnutrition in context of social or environmental circumstances  INTERVENTION:   Continue tube feeds via PEG: - Jevity 1.2 @ 60 ml/hr - Pro-stat 30 ml BID  Tube feeding regimen provides1928kcal, 110grams of protein, and 1175m of H2O.   - Continue free water flushes of 200 ml QID for an additional 800 ml free water daily  - 1 packet Juven BIDper tube, each packet provides 95 calories, 2.5 grams of protein, and 9.8 grams of carbohydrate; also containsL-arginine and L-glutamine, vitamin C, vitamin E, vitamin B-12, zinc, calcium, and calcium Beta-hydroxy-Beta-methylbutyrate to support wound healing  NUTRITION DIAGNOSIS:   Moderate Malnutrition related to social / environmental circumstances (history of substance abuse) as evidenced by moderate muscle depletion, moderate fat depletion.  Ongoing, being addressed via TF  GOAL:   Patient will meet greater than or equal to 90% of their needs  Met via TF  MONITOR:   Labs, Weight trends, TF tolerance, Skin, I & O's  REASON FOR ASSESSMENT:   Consult Enteral/tube feeding initiation and management  ASSESSMENT:   68yo male admitted with PEA arrest after being found down in a parking lot. Urine screen positive for opiates, ETOH positive. PMH includes alcohol and opiate abuse, CAP.  8/17 - s/p trach, Cortrak placed 8/19 - s/p PEG placement 8/20 - TF held due to vomiting 8/21 - TF restarted at trickle 9/18 - episode of emesis  Pt remains medically stable for d/c.  Spoke with RN who reports pt is tolerating TF without issue. No episodes of emesis. RN reports pt's skin looks good today.  Weight stable.  Current TF: Jevity 1.2 @ 60 ml/hr, Pro-stat 30 ml BID, free water 200 ml q 6 hours  Medications reviewed and include: folic acid, liquid MVI, Juven BID, thiamine  Labs reviewed: BUN 26 CBG's: 115  UOP: 1500 ml x 24  hours  Diet Order:   Diet Order    None      EDUCATION NEEDS:   No education needs have been identified at this time  Skin:  Skin Assessment: Skin Integrity Issues: Skin Integrity Issues: DTI: right leg Stage II: left heel  Last BM:  10/28/18  Height:   Ht Readings from Last 1 Encounters:  09/08/18 _0  (1.727 m)    Weight:   Wt Readings from Last 1 Encounters:  10/25/18 63.3 kg    Ideal Body Weight:  70 kg  BMI:  Body mass index is 21.22 kg/m.  Estimated Nutritional Needs:   Kcal:  1750-1950 kcal  Protein:  105-120 grams  Fluid:  >/= 1.7 L/day    KGaynell Face MS, RD, LDN Inpatient Clinical Dietitian Pager: [phone]/After Hours: [phone]

## 2018-10-28 NOTE — TOC Progression Note (Signed)
Transition of Care [facility]) - Progression Note    Patient Details  Name: Micheal Carey MRN: [id number] Date of Birth: 09-03-1950  Transition of Care [facility]) CM/SW [place], [place] Phone Number: [phone] 10/28/2018, 4:08 PM  Clinical Narrative:     CSW followed up with [facility] for any of their locations for referrals sent, they report being unable to accept patient.   CSW worked with Chief of Staff to identify other referral options.   CSW has sent referral to [facility] in [place]. Phone number [phone], Referral faxed to [phone], will follow up with admissions Santiago Glad at [phone].   CSW has sent referral to [facility] in [place], Phone number [phone], faxed referral to [phone], will follow up with admissions Melinda at [phone].   Expected Discharge Plan: Long Term Nursing Home Barriers to Discharge: No SNF bed  Expected Discharge Plan and Services Expected Discharge Plan: [place]     Post Acute Care Choice: Long Term Acute Care (LTAC) Living arrangements for the past 2 months: Single Family Home                                       Social Determinants of Health (SDOH) Interventions    Readmission Risk Interventions No flowsheet data found.

## 2018-10-28 NOTE — Progress Notes (Addendum)
PROGRESS NOTE    Micheal Carey  OZH:[id number] DOB: [birth year] DOA: 09/08/2018 PCP: Fleet Contras, MD      Brief Narrative:  Patient is a 68 year old male who was initially found in the parking lot with PEA arrest, unknown downtime requiring medical ventilation. UDS was positive for opiates, alcohol level was elevated at 276. He had prior history of COPD, alcohol abuse, chronic pain/opiate dependence. Could not be extubated and was subsequently trached on 8/17. Status post PEG on 8/19. PCCM following and managing vent, sedation, weaning trials. Patient transfered to hospitalist team on 8/21. Patient has prolonged hospitalization. 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 Social worker following for disposition: LTAC versus SNF. LTAC placement is difficult because of disposition problem after he finishes LTAC stay. Patient did not qualify for skilled nursing facility due to insurance issues. SW checking long-term bed availability at a nursing facility. Patient is medically stable for discharge as soon as placement is found. -[name] is uncuffed 10/19/18 to make it easier to place patient at facility        Assessment & Plan:  S/p PEA arrest Persistent vegetative state S/p tracheostomy, PEG tube -Continue quetiapine, clonazepam, Lyrica -Continue tube feeds and free water -Consult CCM for trach mgmt   COPD Stable  Acute hypoxic respiratory failure Citrobacter/Acinetobacter pneumonia/UTI Resolved  Hypertension BP normal -Continue carvedilol  Stage II pressure injury, left heel, not POA  Fever Noted several days ago.  Antibiotics held.  Fever transiently again yesterday.  Blood culture from 9/25 no growth.   Suspect this is central fever. -obtain CBC to check WBC -If fever persists, will repeat CXR        MDM and disposition: The below labs and imaging  reports reviewed and summarized above.  Medication management as above.  The patient was admitted with PEA arrest.  Now with persistent vegetative state.  He is PEG and trach dependent.  Efforts are ongoing to find a safe disposition.         DVT prophylaxis: Lovenox Code Status: FULL Family Communication:       Subjective: Eyes open, but he does not make intelligible responses, and no intelligible vocalizations.  Fever yesterday to 100.5, no respiratory distress, diarrhea.  Nursing without concerns.        Objective: Vitals:   10/28/18 0454 10/28/18 0700 10/28/18 0752 10/28/18 1033  BP:   133/76 115/85  Pulse: 97 90 (!) 102 97  Resp: 19 18 19 15   Temp:   98.5 F (36.9 C) 97.6 F (36.4 C)  TempSrc:   Axillary Axillary  SpO2: 99% 100% 98% 100%  Weight:      Height:        Intake/Output Summary (Last 24 hours) at 10/28/2018 1036 Last data filed at 10/27/2018 1900 Gross per 24 hour  Intake -  Output 1500 ml  Net -1500 ml   Filed Weights   10/22/18 0500 10/23/18 0500 10/25/18 0011  Weight: 60.8 kg 60.2 kg 63.3 kg    Examination: General appearance: Chronically ill-appearing adult male, trached and pegged, lying in bed.  Eyes open.    HEENT: Conjunctival, lids and lashes normal.  No nasal deformity, discharge, or epistaxis.  Oropharynx tacky dry, no oral lesions, dentition normal. Skin: Warm, and dry.  Diaphoretic on the face.  No rashes on the face, neck, arms, abdomen, or legs with the exception of the heel.   Cardiac: RRR, no murmurs, no lower extremity edema.  Respiratory: Tracheostomy in place, trach cuff covering it.  Lung sounds regular, good air entry, coarse upper airway sounds noted only.  No wheezing. Abdomen: Voluntary guarding noted, no distention or hepatosplenomegaly. MSK: Diffuse loss of subcutaneous muscle mass and fat, severe.  Contractures in all 4 extremities. Neuro: Pupils large, equal, reactive bilaterally.  Does not follow commands  consistently.    Psych: Unable to assess.    Data Reviewed: I have personally reviewed following labs and imaging studies:  CBC: Recent Labs  Lab 10/23/18 0845 10/25/18 0729 10/26/18 0211 10/27/18 0304  WBC 8.4 7.7 7.4 6.7  NEUTROABS  --   --   --  4.0  HGB 11.4* 12.5* 11.4* 11.7*  HCT 34.8* 38.8* 34.5* 34.0*  MCV 81.3 81.0 80.8 80.8  PLT 225 267 243 829   Basic Metabolic Panel: Recent Labs  Lab 10/23/18 0845 10/25/18 0729 10/26/18 0211 10/27/18 0304  NA 137 137 138 139  K 3.8 4.2 4.2 4.1  CL 104 105 105 105  CO2 25 26 24 24   GLUCOSE 118* 116* 105* 100*  BUN 21 18 28* 26*  CREATININE 0.84 0.77 0.81 0.82  CALCIUM 9.0 9.4 9.0 9.3  MG  --   --  1.9  --    GFR: Estimated Creatinine Clearance: 79.3 mL/min (by C-G formula based on SCr of 0.82 mg/dL). Liver Function Tests: Recent Labs  Lab 10/23/18 0845 10/26/18 0211  AST 30 27  ALT 23 21  ALKPHOS 107 101  BILITOT 0.2* 0.3  PROT 6.7 6.2*  ALBUMIN 2.8* 2.7*   No results for input(s): LIPASE, AMYLASE in the last 168 hours. No results for input(s): AMMONIA in the last 168 hours. Coagulation Profile: No results for input(s): INR, PROTIME in the last 168 hours. Cardiac Enzymes: No results for input(s): CKTOTAL, CKMB, CKMBINDEX, TROPONINI in the last 168 hours. BNP (last 3 results) No results for input(s): PROBNP in the last 8760 hours. HbA1C: No results for input(s): HGBA1C in the last 72 hours. CBG: Recent Labs  Lab 10/23/18 2126 10/24/18 2152 10/25/18 2105 10/26/18 2109 10/27/18 2215  GLUCAP 103* 134* 166* 121* 115*   Lipid Profile: No results for input(s): CHOL, HDL, LDLCALC, TRIG, CHOLHDL, LDLDIRECT in the last 72 hours. Thyroid Function Tests: No results for input(s): TSH, T4TOTAL, FREET4, T3FREE, THYROIDAB in the last 72 hours. Anemia Panel: No results for input(s): VITAMINB12, FOLATE, FERRITIN, TIBC, IRON, RETICCTPCT in the last 72 hours. Urine analysis:    Component Value Date/Time    COLORURINE YELLOW 10/25/2018 1016   APPEARANCEUR CLEAR 10/25/2018 1016   LABSPEC 1.015 10/25/2018 1016   PHURINE 6.0 10/25/2018 1016   GLUCOSEU NEGATIVE 10/25/2018 1016   HGBUR NEGATIVE 10/25/2018 1016   BILIRUBINUR NEGATIVE 10/25/2018 1016   KETONESUR NEGATIVE 10/25/2018 1016   PROTEINUR NEGATIVE 10/25/2018 1016   NITRITE NEGATIVE 10/25/2018 1016   LEUKOCYTESUR NEGATIVE 10/25/2018 1016   Sepsis Labs: @LABRCNTIP (procalcitonin:4,lacticacidven:4)  ) Recent Results (from the past 240 hour(s))  Culture, blood (routine x 2)     Status: None (Preliminary result)   Collection Time: 10/25/18  8:25 AM   Specimen: BLOOD LEFT HAND  Result Value Ref Range Status   Specimen Description BLOOD LEFT HAND  Final   Special Requests   Final    BOTTLES DRAWN AEROBIC ONLY Blood Culture adequate volume   Culture   Final    NO GROWTH 2 DAYS Performed at [facility], [place] [address], [place], [place] [address]    Report Status PENDING  Incomplete  Culture, blood (routine x 2)     Status: None (Preliminary result)   Collection Time: 10/25/18  8:36 AM   Specimen: BLOOD RIGHT HAND  Result Value Ref Range Status   Specimen Description BLOOD RIGHT HAND  Final   Special Requests   Final    BOTTLES DRAWN AEROBIC ONLY Blood Culture adequate volume   Culture   Final    NO GROWTH 2 DAYS Performed at [facility], [address] [place], [place] [address]    Report Status PENDING  Incomplete         Radiology Studies: Dg Chest Port 1 View  Result Date: 10/26/2018 CLINICAL DATA:  Pneumonia, tracheostomy EXAM: PORTABLE CHEST 1 VIEW COMPARISON:  10/25/2018 FINDINGS: Unchanged AP portable examination with bandlike scarring or atelectasis of the right midlung. No new or acute appearing airspace opacity. Unchanged elevation of the left hemidiaphragm. Tracheostomy. Heart and mediastinum are unremarkable. IMPRESSION: Unchanged AP portable examination with bandlike scarring or atelectasis of the  right midlung. No new or acute appearing airspace opacity. Unchanged elevation of the left hemidiaphragm. Tracheostomy. Electronically Signed   By: Lauralyn PrimesAlex  Bibbey M.D.   On: 10/26/2018 11:51        Scheduled Meds: . carvedilol  6.25 mg Per Tube BID WC  . chlorhexidine  15 mL Mouth Rinse BID  . clonazePAM  0.5 mg Per Tube TID  . enoxaparin (LOVENOX) injection  40 mg Subcutaneous Q24H  . feeding supplement (PRO-STAT SUGAR FREE 64)  30 mL Per Tube BID  . folic acid  1 mg Per Tube Daily  . free water  200 mL Per Tube Q6H  . guaiFENesin  5 mL Per Tube Q6H  . mouth rinse  15 mL Mouth Rinse q12n4p  . multivitamin  15 mL Per Tube Daily  . nutrition supplement (JUVEN)  1 packet Per Tube BID BM  . pregabalin  25 mg Per Tube BID  . QUEtiapine  25 mg Per Tube QHS  . thiamine  100 mg Per Tube Daily   Continuous Infusions: . sodium chloride Stopped (10/08/18 2212)  . feeding supplement (JEVITY 1.2 CAL) 1,000 mL (10/26/18 0328)     LOS: 50 days    Time spent: 25 minutes    Alberteen Samhristopher P Margues Filippini, MD [facility] 10/28/2018, 10:36 AM     Please page through AMION:  [email or web address] Password [email or web address] If 7PM-7AM, please contact night-coverage

## 2018-10-28 NOTE — Progress Notes (Signed)
Daily Progress Note   Patient Name: Micheal Carey       Date: 10/28/2018 DOB: 1951/01/27  Age: 68 y.o. MRN#: [id number] Attending Physician: Alberteen Sam, * Primary Care Physician: Fleet Contras, MD Admit Date: 09/08/2018  Reason for Consultation/Follow-up: Establishing goals of care  Subjective: Patient awake in bed. Per nursing- more responsive today, they note he smiled today. I called Jamal just to touch base as promised- let him know that patient had been moved into a room with E-link so family can call and request e-link visit.  Remo Lipps support as he voiced frustration with not being able to be present with his Dad. He notes he wishes his Dad was able to transfer to [place]- but the cost of medical transport is too much for them to pay.  Jamal attempted to get Chy on the phone- but she was not available.  Review of Systems  Unable to perform ROS: Mental acuity    Length of Stay: 50  Current Medications: Scheduled Meds:  . carvedilol  6.25 mg Per Tube BID WC  . chlorhexidine  15 mL Mouth Rinse BID  . clonazePAM  0.5 mg Per Tube TID  . enoxaparin (LOVENOX) injection  40 mg Subcutaneous Q24H  . feeding supplement (PRO-STAT SUGAR FREE 64)  30 mL Per Tube BID  . folic acid  1 mg Per Tube Daily  . free water  200 mL Per Tube Q6H  . guaiFENesin  5 mL Per Tube Q6H  . mouth rinse  15 mL Mouth Rinse q12n4p  . multivitamin  15 mL Per Tube Daily  . nutrition supplement (JUVEN)  1 packet Per Tube BID BM  . pregabalin  25 mg Per Tube BID  . QUEtiapine  25 mg Per Tube QHS  . thiamine  100 mg Per Tube Daily    Continuous Infusions: . sodium chloride Stopped (10/08/18 2212)  . feeding supplement (JEVITY 1.2 CAL) 1,000 mL (10/26/18 0328)    PRN Meds: sodium chloride,  acetaminophen, albuterol, bisacodyl, docusate, fentaNYL (SUBLIMAZE) injection, Gerhardt's butt cream, LORazepam, ondansetron **OR** ondansetron (ZOFRAN) IV, oxyCODONE, pneumococcal 23 valent vaccine  Physical Exam Vitals signs and nursing note reviewed.  Constitutional:      Comments: cachetic  Musculoskeletal:     Comments: R and L upper extremities in braces, lower extremities  contracted  Neurological:     Comments: Nonverbal, does not follow commands- makes eye contact- limited tracking             Vital Signs: BP (!) 143/73 (BP Location: Left Arm)   Pulse 97   Temp 98 F (36.7 C) (Axillary)   Resp (!) 27   Ht 5\' 8"  (1.727 m)   Wt 63.3 kg   SpO2 99%   BMI 21.22 kg/m  SpO2: SpO2: 99 % O2 Device: O2 Device: Tracheostomy Collar O2 Flow Rate: O2 Flow Rate (L/min): 5 L/min  Intake/output summary:   Intake/Output Summary (Last 24 hours) at 10/28/2018 1726 Last data filed at 10/28/2018 1542 Gross per 24 hour  Intake --  Output 925 ml  Net -925 ml   LBM: Last BM Date: 10/28/18 Baseline Weight: Weight: 68 kg Most recent weight: Weight: 63.3 kg       Palliative Assessment/Data: PPS 10%      Patient Active Problem List   Diagnosis Date Noted  . Palliative care by specialist   . Goals of care, counseling/discussion   . Advanced care planning/counseling discussion   . Anoxic encephalopathy ([place]) 10/21/2018  . Alcohol overdose 10/21/2018  . Dysphagia 10/21/2018  . Acute respiratory failure with hypoxia ([place]) 10/21/2018  . Acute lower UTI 10/02/2018  . Tracheostomy status ([place])   . Pressure injury of skin 09/25/2018  . Cardiac arrest ([place])   . Malnutrition of moderate degree 09/14/2018  . Drug overdose 09/08/2018  . History of total hip arthroplasty, right 08/27/2017  . COPD (chronic obstructive pulmonary disease) ([place]) 12/03/2015  . Sepsis ([place]) 12/03/2015  . Alcohol abuse 10/14/2015  . COPD with acute exacerbation ([place]) 10/14/2015  . Pneumonia 10/14/2015     Palliative Care Assessment & Plan   Patient Profile: 68 y.o. male  with past medical history of COPD, ETOH/Opioid dependence admitted on 09/08/2018 with PEA arrest presumed due to heroin overdose. During admission revived, but unable to liberate to from vent resulting in trach and PEG. Has additional residual cognitive and physical deficits leaving him bedbound, nonverbal, and limited meaningful interaction. He is now found to be medically stable and awaiting discharge once a bed at a SNF can be located. Palliative medicine consulted for additional [place] assistance.   Assessment/Recommendations/Plan   Continue current level of care  GOC are to hope for placement in LTC and continue all life prolonging measures  Goals of Care and Additional Recommendations:  Limitations on Scope of Treatment: Full Scope Treatment  Code Status:  Full code  Prognosis:   Unable to determine  Discharge Planning:  LTC  Care plan was discussed with patient's son.  Thank you for allowing the Palliative Medicine Team to assist in the care of this patient.   Time In: 1630 Time Out: 1705 Total Time 35 mins Prolonged Time Billed no      Greater than 50%  of this time was spent counseling and coordinating care related to the above assessment and plan.  Mariana Kaufman, AGNP-C Palliative Medicine   Please contact Palliative Medicine Team phone at [phone] for questions and concerns.

## 2018-10-29 LAB — CBC
HCT: 35.2 % — ABNORMAL LOW (ref 39.0–52.0)
Hemoglobin: 11.9 g/dL — ABNORMAL LOW (ref 13.0–17.0)
MCH: 27 pg (ref 26.0–34.0)
MCHC: 33.8 g/dL (ref 30.0–36.0)
MCV: 79.8 fL — ABNORMAL LOW (ref 80.0–100.0)
Platelets: 296 10*3/uL (ref 150–400)
RBC: 4.41 MIL/uL (ref 4.22–5.81)
RDW: 14 % (ref 11.5–15.5)
WBC: 8.7 10*3/uL (ref 4.0–10.5)
nRBC: 0 % (ref 0.0–0.2)

## 2018-10-29 LAB — GLUCOSE, CAPILLARY: Glucose-Capillary: 117 mg/dL — ABNORMAL HIGH (ref 70–99)

## 2018-10-29 NOTE — Progress Notes (Signed)
Refused hand splint bil. would grimace upon application.

## 2018-10-29 NOTE — Progress Notes (Signed)
PROGRESS NOTE    Micheal Carey  QIO:[id number] DOB: September 17, 1950 DOA: 09/08/2018 PCP: Fleet Contras, MD      Brief Narrative:  Mr. Micheal Carey is a 68 y.o. M with chart history COPD, alcohol dependence c/b withdrawal who presented found down in parking lot.  Bystander CPR started, EMS found in PEA arrest but ROSC after 10 minutes CPR and 1 mg epi.  Remained completely unresponsive after ROSC.  At hospital, Utox positive for opiates, intubated and admitted to ICU.    Could not be extubated and was subsequently trached on 8/17. Status post PEG on 8/19. PCCM following and managing vent, sedation, weaning trials. Patient transfered to hospitalist team on 8/21.   Patient has had prolonged hospitalization; hospital course remarkable forsevere anoxic encephalopathy with persistent vegetative state , intermittent vomiting, recurrent citrobacter/acinetobacter pneumonia andUTI.   Micheal Carey was uncuffed 10/19/18.        Assessment & Plan:  S/p PEA arrest Persistent vegetative state S/p tracheostomy, PEG tube -Continue quetiapine, clonazepam, Lyrica -Continue tube feeds and free water -Consult CCM for trach mgmt   COPD Stable  Acute hypoxic respiratory failure Citrobacter/Acinetobacter pneumonia/UTI Resolved  Hypertension BP normal -Continue carvedilol  Stage II pressure injury, left heel, not POA  Fever 9/27 Noted 9/27.  Antibiotics held.  No subsequent fever. Blood cultures 9/25 NGTD.  WBC normal.    No further fever.  Suspect this is neurologically mediated.        MDM and disposition: The below labs and imaging reports were reviewed and summarized above.  Medication management as above.  The patient was admitted with PEA arrest.  Now with persistent vegetative state.  He is PEG and trach dependent.  Efforts ongoing to find a safe disposition.         DVT prophylaxis: Lovenox Code Status: FULL Family Communication:       Subjective: No further fever.  No  respiratory distress, diarrhea, change in urine character.  No nursing concerns.      Objective: Vitals:   10/29/18 0700 10/29/18 0706 10/29/18 1122 10/29/18 1200  BP:  105/72    Pulse:  93 94 100  Resp:  19 16   Temp: 99 F (37.2 C)   99 F (37.2 C)  TempSrc: Axillary   Axillary  SpO2:  98% 95%   Weight:      Height:        Intake/Output Summary (Last 24 hours) at 10/29/2018 1326 Last data filed at 10/29/2018 1200 Gross per 24 hour  Intake 300 ml  Output 980 ml  Net -680 ml   Filed Weights   10/22/18 0500 10/23/18 0500 10/25/18 0011  Weight: 60.8 kg 60.2 kg 63.3 kg    Examination: General appearance: Chronically ill-appearing adult male, trached and pegged, lying in bed, eyes open.  Tracks poorly.    HEENT:   Skin:    Cardiac: RRR, no murmurs, no lower extremity edema. Respiratory: Tracheostomy in place, trach capped covering it.  Lung sounds regular, good air entry.  Coarse large airway sounds only.  No wheezing. Abdomen: Voluntary guarding, no grimace to palpation. MSK: Diffuse loss of subcutaneous muscle mass and fat, severe.  Contractures in all 4 extremities. Neuro: No change    Psych:      Data Reviewed: I have personally reviewed following labs and imaging studies:  CBC: Recent Labs  Lab 10/23/18 0845 10/25/18 0729 10/26/18 0211 10/27/18 0304 10/29/18 0139  WBC 8.4 7.7 7.4 6.7 8.7  NEUTROABS  --   --   --  4.0  --   HGB 11.4* 12.5* 11.4* 11.7* 11.9*  HCT 34.8* 38.8* 34.5* 34.0* 35.2*  MCV 81.3 81.0 80.8 80.8 79.8*  PLT 225 267 243 265 296   Basic Metabolic Panel: Recent Labs  Lab 10/23/18 0845 10/25/18 0729 10/26/18 0211 10/27/18 0304  NA 137 137 138 139  K 3.8 4.2 4.2 4.1  CL 104 105 105 105  CO2 25 26 24 24   GLUCOSE 118* 116* 105* 100*  BUN 21 18 28* 26*  CREATININE 0.84 0.77 0.81 0.82  CALCIUM 9.0 9.4 9.0 9.3  MG  --   --  1.9  --    GFR: Estimated Creatinine Clearance: 79.3 mL/min (by C-G formula based on SCr of 0.82 mg/dL).  Liver Function Tests: Recent Labs  Lab 10/23/18 0845 10/26/18 0211  AST 30 27  ALT 23 21  ALKPHOS 107 101  BILITOT 0.2* 0.3  PROT 6.7 6.2*  ALBUMIN 2.8* 2.7*   No results for input(s): LIPASE, AMYLASE in the last 168 hours. No results for input(s): AMMONIA in the last 168 hours. Coagulation Profile: No results for input(s): INR, PROTIME in the last 168 hours. Cardiac Enzymes: No results for input(s): CKTOTAL, CKMB, CKMBINDEX, TROPONINI in the last 168 hours. BNP (last 3 results) No results for input(s): PROBNP in the last 8760 hours. HbA1C: No results for input(s): HGBA1C in the last 72 hours. CBG: Recent Labs  Lab 10/24/18 2152 10/25/18 2105 10/26/18 2109 10/27/18 2215 10/28/18 2133  GLUCAP 134* 166* 121* 115* 118*   Lipid Profile: No results for input(s): CHOL, HDL, LDLCALC, TRIG, CHOLHDL, LDLDIRECT in the last 72 hours. Thyroid Function Tests: No results for input(s): TSH, T4TOTAL, FREET4, T3FREE, THYROIDAB in the last 72 hours. Anemia Panel: No results for input(s): VITAMINB12, FOLATE, FERRITIN, TIBC, IRON, RETICCTPCT in the last 72 hours. Urine analysis:    Component Value Date/Time   COLORURINE YELLOW 10/25/2018 1016   APPEARANCEUR CLEAR 10/25/2018 1016   LABSPEC 1.015 10/25/2018 1016   PHURINE 6.0 10/25/2018 1016   GLUCOSEU NEGATIVE 10/25/2018 1016   HGBUR NEGATIVE 10/25/2018 1016   BILIRUBINUR NEGATIVE 10/25/2018 1016   KETONESUR NEGATIVE 10/25/2018 1016   PROTEINUR NEGATIVE 10/25/2018 1016   NITRITE NEGATIVE 10/25/2018 1016   LEUKOCYTESUR NEGATIVE 10/25/2018 1016   Sepsis Labs: @LABRCNTIP (procalcitonin:4,lacticacidven:4)  ) Recent Results (from the past 240 hour(s))  Culture, blood (routine x 2)     Status: None (Preliminary result)   Collection Time: 10/25/18  8:25 AM   Specimen: BLOOD LEFT HAND  Result Value Ref Range Status   Specimen Description BLOOD LEFT HAND  Final   Special Requests   Final    BOTTLES DRAWN AEROBIC ONLY Blood Culture  adequate volume   Culture   Final    NO GROWTH 4 DAYS Performed at [facility], [address] [place], [place] [address]    Report Status PENDING  Incomplete  Culture, blood (routine x 2)     Status: None (Preliminary result)   Collection Time: 10/25/18  8:36 AM   Specimen: BLOOD RIGHT HAND  Result Value Ref Range Status   Specimen Description BLOOD RIGHT HAND  Final   Special Requests   Final    BOTTLES DRAWN AEROBIC ONLY Blood Culture adequate volume   Culture   Final    NO GROWTH 4 DAYS Performed at [facility], [address] [place], [place] [address]    Report Status PENDING  Incomplete  Radiology Studies: No results found.      Scheduled Meds: . carvedilol  6.25 mg Per Tube BID WC  . chlorhexidine  15 mL Mouth Rinse BID  . clonazePAM  0.5 mg Per Tube TID  . enoxaparin (LOVENOX) injection  40 mg Subcutaneous Q24H  . feeding supplement (PRO-STAT SUGAR FREE 64)  30 mL Per Tube BID  . folic acid  1 mg Per Tube Daily  . free water  200 mL Per Tube Q6H  . Gerhardt's butt cream   Topical QID  . guaiFENesin  5 mL Per Tube Q6H  . mouth rinse  15 mL Mouth Rinse q12n4p  . multivitamin  15 mL Per Tube Daily  . nutrition supplement (JUVEN)  1 packet Per Tube BID BM  . pregabalin  25 mg Per Tube BID  . QUEtiapine  25 mg Per Tube QHS  . thiamine  100 mg Per Tube Daily   Continuous Infusions: . sodium chloride Stopped (10/08/18 2212)  . feeding supplement (JEVITY 1.2 CAL) 1,000 mL (10/26/18 0328)     LOS: 51 days    Time spent: 15 minutes    Edwin Dada, MD [facility] 10/29/2018, 1:26 PM     Please page through AMION:  [email or web address] Password [email or web address] If 7PM-7AM, please contact night-coverage

## 2018-10-30 DIAGNOSIS — J410 Simple chronic bronchitis: Secondary | ICD-10-CM

## 2018-10-30 LAB — CULTURE, BLOOD (ROUTINE X 2)
Culture: NO GROWTH
Culture: NO GROWTH
Special Requests: ADEQUATE
Special Requests: ADEQUATE

## 2018-10-30 LAB — GLUCOSE, CAPILLARY: Glucose-Capillary: 123 mg/dL — ABNORMAL HIGH (ref 70–99)

## 2018-10-30 NOTE — Care Management Important Message (Signed)
Important Message  Patient Details  Name: Micheal Carey MRN: [id number] Date of Birth: 1950/02/28   Medicare Important Message Given:  Yes     Memory Argue 10/30/2018, 2:53 PM

## 2018-10-30 NOTE — Progress Notes (Signed)
Assisted tele visit to patient with daughter.  Sahan Pen Samson, RN  

## 2018-10-30 NOTE — Progress Notes (Signed)
PROGRESS NOTE        PATIENT DETAILS Name: Micheal Carey Age: 68 y.o. Sex: male Date of Birth: [birth year] Admit Date: 09/08/2018 Admitting Physician Alyson Reedy, MD BTD:[id number], [name], MD  Brief Narrative: Patient is a 67 y.o. male with history of COPD, EtOH dependence-presented with PEA arrest with ROSC after 10 minutes.  Unfortunately patient remained unresponsive after ROSC-was intubated and admitted to the ICU.  Unfortunately-Hospital course has been prolonged-due to severe anoxic encephalopathy-patient is now s/p PEG and trach.  See below for further details.  Subjective: Awakes to verbal stimuli-mumbles  Assessment/Plan: PEA arrest with anoxic encephalopathy and persistent vegetative state: Stable-continue Seroquel, Klonopin and Lyrica.  Continue tube feeds.Palliative care following-full scope of treatment.   Acute hypoxic respiratory failure: intubated on admit-now s/p trach  Acinetobacter/Citrobacter PNA/UTI: Herbert Deaner antimicrobial therapy\  HTN: controlled-continue Coreg  COPD: stable  Stage II pressure injury, left heel, not POA   Nutrition Problem:  Nutrition Problem: Moderate Malnutrition Etiology: social / environmental circumstances(Hx substance abuse) Signs/Symptoms: moderate muscle depletion, moderate fat depletion Interventions: Tube feeding, Prostat, Juven, MVI    Diet: Diet Order    None       DVT Prophylaxis: Prophylactic Lovenox   Code Status: Full code   Family Communication: None at bedside  Disposition Plan: Remain inpatient0-awaiting SNF bed  Antimicrobial agents: Anti-infectives (From admission, onward)   Start     Dose/Rate Route Frequency Ordered Stop   09/30/18 1400  meropenem (MERREM) 1 g in sodium chloride 0.9 % 100 mL IVPB     1 g 200 mL/hr over 30 Minutes Intravenous Every 8 hours 09/30/18 1119 10/07/18 0539   09/27/18 1200  ceFEPIme (MAXIPIME) 2 g in sodium chloride 0.9 % 100 mL IVPB   Status:  Discontinued     2 g 200 mL/hr over 30 Minutes Intravenous Every 8 hours 09/27/18 1133 09/30/18 1119   09/18/18 1330  ceFAZolin (ANCEF) IVPB 2g/100 mL premix     2 g 200 mL/hr over 30 Minutes Intravenous To Radiology 09/18/18 1323 09/18/18 1356   09/17/18 0900  ceFAZolin (ANCEF) IVPB 2g/100 mL premix     2 g 200 mL/hr over 30 Minutes Intravenous To Radiology 09/17/18 0849 09/18/18 0900   09/14/18 1000  cefTRIAXone (ROCEPHIN) 2 g in sodium chloride 0.9 % 100 mL IVPB  Status:  Discontinued     2 g 200 mL/hr over 30 Minutes Intravenous Every 24 hours 09/14/18 0919 09/16/18 1040   09/12/18 0000  vancomycin (VANCOCIN) 1,250 mg in sodium chloride 0.9 % 250 mL IVPB  Status:  Discontinued     1,250 mg 166.7 mL/hr over 90 Minutes Intravenous Every 24 hours 09/11/18 0044 09/11/18 1229   09/12/18 0000  vancomycin (VANCOCIN) IVPB 1000 mg/200 mL premix  Status:  Discontinued     1,000 mg 200 mL/hr over 60 Minutes Intravenous Every 24 hours 09/11/18 1229 09/13/18 1117   09/11/18 0800  piperacillin-tazobactam (ZOSYN) IVPB 3.375 g  Status:  Discontinued     3.375 g 12.5 mL/hr over 240 Minutes Intravenous Every 8 hours 09/11/18 0044 09/13/18 1117   09/11/18 0045  piperacillin-tazobactam (ZOSYN) IVPB 3.375 g     3.375 g 100 mL/hr over 30 Minutes Intravenous  Once 09/11/18 0040 09/11/18 0232   09/11/18 0045  vancomycin (VANCOCIN) 1,500 mg in sodium chloride 0.9 % 500 mL IVPB  1,500 mg 250 mL/hr over 120 Minutes Intravenous  Once 09/11/18 0040 09/11/18 0447      Procedures: 817>>peg 8/19>>trach  CONSULTS:  pulmonary/intensive care and neurology  Time spent: 25- minutes-Greater than 50% of this time was spent in counseling, explanation of diagnosis, planning of further management, and coordination of care.  MEDICATIONS: Scheduled Meds: . carvedilol  6.25 mg Per Tube BID WC  . chlorhexidine  15 mL Mouth Rinse BID  . clonazePAM  0.5 mg Per Tube TID  . enoxaparin (LOVENOX) injection   40 mg Subcutaneous Q24H  . feeding supplement (PRO-STAT SUGAR FREE 64)  30 mL Per Tube BID  . folic acid  1 mg Per Tube Daily  . free water  200 mL Per Tube Q6H  . Gerhardt's butt cream   Topical QID  . guaiFENesin  5 mL Per Tube Q6H  . mouth rinse  15 mL Mouth Rinse q12n4p  . multivitamin  15 mL Per Tube Daily  . nutrition supplement (JUVEN)  1 packet Per Tube BID BM  . pregabalin  25 mg Per Tube BID  . QUEtiapine  25 mg Per Tube QHS  . thiamine  100 mg Per Tube Daily   Continuous Infusions: . sodium chloride Stopped (10/08/18 2212)  . feeding supplement (JEVITY 1.2 CAL) 1,000 mL (10/29/18 1541)   PRN Meds:.sodium chloride, acetaminophen, albuterol, bisacodyl, docusate, fentaNYL (SUBLIMAZE) injection, Gerhardt's butt cream, LORazepam, ondansetron **OR** ondansetron (ZOFRAN) IV, oxyCODONE, pneumococcal 23 valent vaccine   PHYSICAL EXAM: Vital signs: Vitals:   10/30/18 0719 10/30/18 0800 10/30/18 1040 10/30/18 1119  BP: 138/87 133/78 (!) 141/80 (!) 141/82  Pulse: (!) 109 (!) 108 (!) 102 (!) 101  Resp: Temp:    99 F (37.2 C)  TempSrc:    Axillary  SpO2: 96% 97% 96% 97%  Weight:      Height:       Filed Weights   10/22/18 0500 10/23/18 0500 10/25/18 0011  Weight: 60.8 kg 60.2 kg 63.3 kg   Body mass index is 21.22 kg/m.   Gen Exam:awake-not in distress HEENT:atraumatic, normocephalic Chest: B/L clear to auscultation anteriorly CVS:S1S2 regular Abdomen:soft non tender, non distended Extremities:no edema Skin: no rash  I have personally reviewed following labs and imaging studies  LABORATORY DATA: CBC: Recent Labs  Lab 10/25/18 0729 10/26/18 0211 10/27/18 0304 10/29/18 0139  WBC 7.7 7.4 6.7 8.7  NEUTROABS  --   --  4.0  --   HGB 12.5* 11.4* 11.7* 11.9*  HCT 38.8* 34.5* 34.0* 35.2*  MCV 81.0 80.8 80.8 79.8*  PLT 267 243 265 296    Basic Metabolic Panel: Recent Labs  Lab 10/25/18 0729 10/26/18 0211 10/27/18 0304  NA 137 138 139  K 4.2  4.2 4.1  CL 105 105 105  CO2 GLUCOSE 116* 105* 100*  BUN 18 28* 26*  CREATININE 0.77 0.81 0.82  CALCIUM 9.4 9.0 9.3  MG  --  1.9  --     GFR: Estimated Creatinine Clearance: 79.3 mL/min (by C-G formula based on SCr of 0.82 mg/dL).  Liver Function Tests: Recent Labs  Lab 10/26/18 0211  AST 27  ALT 21  ALKPHOS 101  BILITOT 0.3  PROT 6.2*  ALBUMIN 2.7*   No results for input(s): LIPASE, AMYLASE in the last 168 hours. No results for input(s): AMMONIA in the last 168 hours.  Coagulation Profile: No results for input(s): INR, PROTIME in the last 168 hours.  Cardiac  Enzymes: No results for input(s): CKTOTAL, CKMB, CKMBINDEX, TROPONINI in the last 168 hours.  BNP (last 3 results) No results for input(s): PROBNP in the last 8760 hours.  HbA1C: No results for input(s): HGBA1C in the last 72 hours.  CBG: Recent Labs  Lab 10/25/18 2105 10/26/18 2109 10/27/18 2215 10/28/18 2133 10/29/18 2127  GLUCAP 166* 121* 115* 118* 117*    Lipid Profile: No results for input(s): CHOL, HDL, LDLCALC, TRIG, CHOLHDL, LDLDIRECT in the last 72 hours.  Thyroid Function Tests: No results for input(s): TSH, T4TOTAL, FREET4, T3FREE, THYROIDAB in the last 72 hours.  Anemia Panel: No results for input(s): VITAMINB12, FOLATE, FERRITIN, TIBC, IRON, RETICCTPCT in the last 72 hours.  Urine analysis:    Component Value Date/Time   COLORURINE YELLOW 10/25/2018 1016   APPEARANCEUR CLEAR 10/25/2018 1016   LABSPEC 1.015 10/25/2018 1016   PHURINE 6.0 10/25/2018 1016   GLUCOSEU NEGATIVE 10/25/2018 1016   HGBUR NEGATIVE 10/25/2018 1016   BILIRUBINUR NEGATIVE 10/25/2018 1016   KETONESUR NEGATIVE 10/25/2018 1016   PROTEINUR NEGATIVE 10/25/2018 1016   NITRITE NEGATIVE 10/25/2018 1016   LEUKOCYTESUR NEGATIVE 10/25/2018 1016    Sepsis Labs: Lactic Acid, Venous    Component Value Date/Time   LATICACIDVEN 1.7 09/14/2018 0357    MICROBIOLOGY: Recent Results (from the past 240  hour(s))  Culture, blood (routine x 2)     Status: None   Collection Time: 10/25/18  8:25 AM   Specimen: BLOOD LEFT HAND  Result Value Ref Range Status   Specimen Description BLOOD LEFT HAND  Final   Special Requests   Final    BOTTLES DRAWN AEROBIC ONLY Blood Culture adequate volume   Culture   Final    NO GROWTH 5 DAYS Performed at [facility], [place] [address] [place], [place] [address]    Report Status 10/30/2018 FINAL  Final  Culture, blood (routine x 2)     Status: None   Collection Time: 10/25/18  8:36 AM   Specimen: BLOOD RIGHT HAND  Result Value Ref Range Status   Specimen Description BLOOD RIGHT HAND  Final   Special Requests   Final    BOTTLES DRAWN AEROBIC ONLY Blood Culture adequate volume   Culture   Final    NO GROWTH 5 DAYS Performed at [facility], [place] [address], [place], [place] [address]    Report Status 10/30/2018 FINAL  Final    RADIOLOGY STUDIES/RESULTS: Dg Chest Port 1 View  Result Date: 10/26/2018 CLINICAL DATA:  Pneumonia, tracheostomy EXAM: PORTABLE CHEST 1 VIEW COMPARISON:  10/25/2018 FINDINGS: Unchanged AP portable examination with bandlike scarring or atelectasis of the right midlung. No new or acute appearing airspace opacity. Unchanged elevation of the left hemidiaphragm. Tracheostomy. Heart and mediastinum are unremarkable. IMPRESSION: Unchanged AP portable examination with bandlike scarring or atelectasis of the right midlung. No new or acute appearing airspace opacity. Unchanged elevation of the left hemidiaphragm. Tracheostomy. Electronically Signed   By: Eddie Candle M.D.   On: 10/26/2018 11:51   Dg Chest Port 1 View  Result Date: 10/25/2018 CLINICAL DATA:  Fever EXAM: PORTABLE CHEST 1 VIEW COMPARISON:  09/30/2018 FINDINGS: Lungs are well aerated and clear. Negative for pneumonia. Negative for heart failure or effusion. Tracheostomy remains in good position. IMPRESSION: No active disease. Electronically Signed   By: Franchot Gallo  M.D.   On: 10/25/2018 10:06     LOS: 52 days   Oren Binet, MD  [facility]  If 7PM-7AM, please contact  night-coverage  Please page via [email or web address]  Go to [email or web address] and use 's universal password to access. If you do not have the password, please contact the hospital operator.  Locate the [facility] provider you are looking for under [facility] and page to a number that you can be directly reached. If you still have difficulty reaching the provider, please page the [facility] (Director on Call) for the Hospitalists listed on amion for assistance.  10/30/2018, 11:45 AM

## 2018-10-30 NOTE — TOC Progression Note (Addendum)
Transition of Care [facility]) - Progression Note    Patient Details  Name: Micheal Carey MRN: [id number] Date of Birth: 12/04/1950  [facility] CM/SW [place], [place] Phone Number: [phone] 10/30/2018, 9:44 AM  Clinical Narrative:     CSW has left voicemail's for both [facility] and  [facility] to follow up on faxed referrals sent. [facility] reports declining patient for all of their locations. CSW has reached out to [employer] contact [place] to look at any/all of their locations for potential long term beds.   CSW spoke with [name] regarding wait list for [facility] and provided him with the name of Micheal Carey and phone number of [phone] to reach out to complete paperwork for patient to be put on their wait list for long term bed. Micheal Carey reports he will reach out and complete in order for patient to be on their wait list. CSW informed him that multiple referrals are being made each day in the hopes of identifying a facility with long term bed availability. Jamaal expressed gratitude and understanding.   CSW continues to work with supervisor Denyse Amass on potential referral options and expanding search.   [facility] reports they have long term beds avail and are reviewing patient's chart again today, initially declined a few weeks ago due to high needs. CSW reiterated patient's improvement and decrease of frequency  Of suctioning needs recently.   Expected Discharge Plan: Long Term Nursing Home Barriers to Discharge: No SNF bed  Expected Discharge Plan and Services Expected Discharge Plan: [place]     Post Acute Care Choice: Long Term Acute Care (LTAC) Living arrangements for the past 2 months: Single Family Home                                       Social Determinants of Health (SDOH) Interventions    Readmission Risk Interventions No flowsheet data found.

## 2018-10-30 NOTE — Consult Note (Signed)
[facility] Nurse wound follow up Patient receiving care in [place]. Wound type: PI to left lateral heel consistent with a stage 2 Measurement:0.4 x 0.4 cm Wound bed: 100% pink Drainage (amount, consistency, odor) nione Periwound: intact Dressing procedure/placement/frequency: Continue heel foam dressing and Prevalon boots. Monitor the wound area(s) for worsening of condition such as: Signs/symptoms of infection,  Increase in size,  Development of or worsening of odor, Development of pain, or increased pain at the affected locations.  Notify the medical team if any of these develop. Val Riles, RN, MSN, CWOCN, CNS-BC, pager [phone]

## 2018-10-31 LAB — GLUCOSE, CAPILLARY: Glucose-Capillary: 105 mg/dL — ABNORMAL HIGH (ref 70–99)

## 2018-10-31 NOTE — Progress Notes (Signed)
Nutrition Follow-up  DOCUMENTATION CODES:   Non-severe (moderate) malnutrition in context of social or environmental circumstances  INTERVENTION:   Continue tube feeds via PEG: - Jevity 1.2 @ 60 ml/hr - Pro-stat 30 ml BID  Tube feeding regimen provides1928kcal, 110grams of protein, and 1118m of H2O.   - Continue free water flushes of 200 ml QID for an additional 800 ml free water daily  - 1 packet Juven BIDper tube, each packet provides 95 calories, 2.5 grams of protein, and 9.8 grams of carbohydrate; also containsL-arginine and L-glutamine, vitamin C, vitamin E, vitamin B-12, zinc, calcium, and calcium Beta-hydroxy-Beta-methylbutyrate to support wound healing  NUTRITION DIAGNOSIS:   Moderate Malnutrition related to social / environmental circumstances (history of substance abuse) as evidenced by moderate muscle depletion, moderate fat depletion.  Ongoing, being addressed via TF  GOAL:   Patient will meet greater than or equal to 90% of their needs  Met via TF  MONITOR:   Labs, Weight trends, TF tolerance, Skin, I & O's  REASON FOR ASSESSMENT:   Consult Enteral/tube feeding initiation and management  ASSESSMENT:   68yo male admitted with PEA arrest after being found down in a parking lot. Urine screen positive for opiates, ETOH positive. PMH includes alcohol and opiate abuse, CAP.  8/17 - s/p trach, Cortrak placed 8/19 - s/p PEG placement 8/20 - TF held due to vomiting 8/21 - TF restarted at trickle 9/18 - episode of emesis  Pt remains medically stable for d/c. Spoke with RN who reports pt is tolerating TF without issue.  Weight up 3 lbs over the last week.  Current TF: Jevity 1.2 @ 60 ml/hr, Pro-stat 30 ml BID, free water 200 ml q 6 hours  Medications reviewed and include: folic acid, liquid MVI, Juven BID, thiamine  Labs reviewed. CBG's: 123  UOP: 1150 ml x 24 hours  Diet Order:   Diet Order    None      EDUCATION NEEDS:   No  education needs have been identified at this time  Skin:  Skin Assessment: Skin Integrity Issues: Skin Integrity Issues: DTI: right leg Stage II: left heel  Last BM:  10/30/18  Height:   Ht Readings from Last 1 Encounters:  09/08/18 _0  (1.727 m)    Weight:   Wt Readings from Last 1 Encounters:  10/31/18 64.6 kg    Ideal Body Weight:  70 kg  BMI:  Body mass index is 21.65 kg/m.  Estimated Nutritional Needs:   Kcal:  1750-1950 kcal  Protein:  105-120 grams  Fluid:  >/= 1.7 L/day    KGaynell Face MS, RD, LDN Inpatient Clinical Dietitian Pager: [phone]/After Hours: [phone]

## 2018-10-31 NOTE — TOC Progression Note (Signed)
Transition of Care [facility]) - Progression Note    Patient Details  Name: Micheal Carey MRN: [id number] Date of Birth: [birth year]  Transition of Care [facility]) CM/SW [place], [place] Phone Number: [phone] 10/31/2018, 1:28 PM  Clinical Narrative:     CSW spoke with Audelia Acton at [facility] at [phone]. He reports he can forward patient's referral to all of the facilities he works with to see if any can accept, as several of his facilities take trach patients. CSW has Network engineer referral for patient, pending bed offers.    Expected Discharge Plan: Long Term Nursing Home Barriers to Discharge: No SNF bed  Expected Discharge Plan and Services Expected Discharge Plan: [place]     Post Acute Care Choice: Long Term Acute Care (LTAC) Living arrangements for the past 2 months: Single Family Home                                       Social Determinants of Health (SDOH) Interventions    Readmission Risk Interventions No flowsheet data found.

## 2018-10-31 NOTE — Progress Notes (Signed)
PROGRESS NOTE        PATIENT DETAILS Name: Micheal Carey Age: 68 y.o. Sex: male Date of Birth: 1950/05/17 Admit Date: 09/08/2018 Admitting Physician Rush Farmer, MD [id number], Christean Grief, MD  Brief Narrative: Patient is a 68 y.o. male with history of COPD, EtOH dependence-presented with PEA arrest with ROSC after 10 minutes.  Unfortunately patient remained unresponsive after ROSC-was intubated and admitted to the ICU.  Unfortunately-Hospital course has been prolonged-due to severe anoxic encephalopathy-patient is now s/p PEG and trach.  See below for further details.  Subjective: Awakes to verbal stimuli-mumbles  Assessment/Plan: PEA arrest with anoxic encephalopathy and persistent vegetative state: Remains stable-continue Seroquel, Klonopin and Lyrica.  Remains on tube feeds.  Palliative care following-full scope of treatment recommended at this point per family's wishes.  Awaiting SNF.  Acute hypoxic respiratory failure: intubated on admit-now s/p trach  Acinetobacter/Citrobacter PNA/UTI: Fanny Skates antimicrobial therapy\  HTN: controlled-continue Coreg  COPD: stable  Stage II pressure injury, left heel, not POA   Nutrition Problem:  Nutrition Problem: Moderate Malnutrition Etiology: social / environmental circumstances(Hx substance abuse) Signs/Symptoms: moderate muscle depletion, moderate fat depletion Interventions: Tube feeding, Prostat, Juven, MVI    Diet: Diet Order    None       DVT Prophylaxis: Prophylactic Lovenox   Code Status: Full code   Family Communication: None at bedside  Disposition Plan: Remain inpatient0-awaiting SNF bed  Antimicrobial agents: Anti-infectives (From admission, onward)   Start     Dose/Rate Route Frequency Ordered Stop   09/30/18 1400  meropenem (MERREM) 1 g in sodium chloride 0.9 % 100 mL IVPB     1 g 200 mL/hr over 30 Minutes Intravenous Every 8 hours 09/30/18 1119 10/07/18 0539   09/27/18  1200  ceFEPIme (MAXIPIME) 2 g in sodium chloride 0.9 % 100 mL IVPB  Status:  Discontinued     2 g 200 mL/hr over 30 Minutes Intravenous Every 8 hours 09/27/18 1133 09/30/18 1119   09/18/18 1330  ceFAZolin (ANCEF) IVPB 2g/100 mL premix     2 g 200 mL/hr over 30 Minutes Intravenous To Radiology 09/18/18 1323 09/18/18 1356   09/17/18 0900  ceFAZolin (ANCEF) IVPB 2g/100 mL premix     2 g 200 mL/hr over 30 Minutes Intravenous To Radiology 09/17/18 0849 09/18/18 0900   09/14/18 1000  cefTRIAXone (ROCEPHIN) 2 g in sodium chloride 0.9 % 100 mL IVPB  Status:  Discontinued     2 g 200 mL/hr over 30 Minutes Intravenous Every 24 hours 09/14/18 0919 09/16/18 1040   09/12/18 0000  vancomycin (VANCOCIN) 1,250 mg in sodium chloride 0.9 % 250 mL IVPB  Status:  Discontinued     1,250 mg 166.7 mL/hr over 90 Minutes Intravenous Every 24 hours 09/11/18 0044 09/11/18 1229   09/12/18 0000  vancomycin (VANCOCIN) IVPB 1000 mg/200 mL premix  Status:  Discontinued     1,000 mg 200 mL/hr over 60 Minutes Intravenous Every 24 hours 09/11/18 1229 09/13/18 1117   09/11/18 0800  piperacillin-tazobactam (ZOSYN) IVPB 3.375 g  Status:  Discontinued     3.375 g 12.5 mL/hr over 240 Minutes Intravenous Every 8 hours 09/11/18 0044 09/13/18 1117   09/11/18 0045  piperacillin-tazobactam (ZOSYN) IVPB 3.375 g     3.375 g 100 mL/hr over 30 Minutes Intravenous  Once 09/11/18 0040 09/11/18 0232   09/11/18 0045  vancomycin (VANCOCIN) 1,500  mg in sodium chloride 0.9 % 500 mL IVPB     1,500 mg 250 mL/hr over 120 Minutes Intravenous  Once 09/11/18 0040 09/11/18 0447      Procedures: 817>>peg 8/19>>trach  CONSULTS:  pulmonary/intensive care and neurology  Time spent: 15- minutes-Greater than 50% of this time was spent in counseling, explanation of diagnosis, planning of further management, and coordination of care.  MEDICATIONS: Scheduled Meds: . carvedilol  6.25 mg Per Tube BID WC  . chlorhexidine  15 mL Mouth Rinse BID  .  clonazePAM  0.5 mg Per Tube TID  . enoxaparin (LOVENOX) injection  40 mg Subcutaneous Q24H  . feeding supplement (PRO-STAT SUGAR FREE 64)  30 mL Per Tube BID  . folic acid  1 mg Per Tube Daily  . free water  200 mL Per Tube Q6H  . Gerhardt's butt cream   Topical QID  . guaiFENesin  5 mL Per Tube Q6H  . mouth rinse  15 mL Mouth Rinse q12n4p  . multivitamin  15 mL Per Tube Daily  . nutrition supplement (JUVEN)  1 packet Per Tube BID BM  . pregabalin  25 mg Per Tube BID  . QUEtiapine  25 mg Per Tube QHS  . thiamine  100 mg Per Tube Daily   Continuous Infusions: . sodium chloride Stopped (10/08/18 2212)  . feeding supplement (JEVITY 1.2 CAL) 1,000 mL (10/29/18 1541)   PRN Meds:.sodium chloride, acetaminophen, albuterol, bisacodyl, docusate, fentaNYL (SUBLIMAZE) injection, Gerhardt's butt cream, LORazepam, ondansetron **OR** ondansetron (ZOFRAN) IV, oxyCODONE, pneumococcal 23 valent vaccine   PHYSICAL EXAM: Vital signs: Vitals:   10/31/18 0719 10/31/18 0723 10/31/18 1040 10/31/18 1124  BP: 126/83  132/77   Pulse: 98 100 92 95  Resp: 16  (!) 21   Temp: 97.9 F (36.6 C)  97.9 F (36.6 C)   TempSrc: Axillary  Axillary   SpO2: 98%  96%   Weight:      Height:       Filed Weights   10/23/18 0500 10/25/18 0011 10/31/18 0403  Weight: 60.2 kg 63.3 kg 64.6 kg   Body mass index is 21.65 kg/m.   Gen Exam:awake-not in distress HEENT:atraumatic, normocephalic Chest: B/L clear to auscultation anteriorly CVS:S1S2 regular Abdomen:soft non tender, non distended Extremities:no edema Skin: no rash  I have personally reviewed following labs and imaging studies  LABORATORY DATA: CBC: Recent Labs  Lab 10/25/18 0729 10/26/18 0211 10/27/18 0304 10/29/18 0139  WBC 7.7 7.4 6.7 8.7  NEUTROABS  --   --  4.0  --   HGB 12.5* 11.4* 11.7* 11.9*  HCT 38.8* 34.5* 34.0* 35.2*  MCV 81.0 80.8 80.8 79.8*  PLT 267 243 265 296    Basic Metabolic Panel: Recent Labs  Lab 10/25/18 0729  10/26/18 0211 10/27/18 0304  NA 137 138 139  K 4.2 4.2 4.1  CL 105 105 105  CO2 26 24 24   GLUCOSE 116* 105* 100*  BUN 18 28* 26*  CREATININE 0.77 0.81 0.82  CALCIUM 9.4 9.0 9.3  MG  --  1.9  --     GFR: Estimated Creatinine Clearance: 81 mL/min (by C-G formula based on SCr of 0.82 mg/dL).  Liver Function Tests: Recent Labs  Lab 10/26/18 0211  AST 27  ALT 21  ALKPHOS 101  BILITOT 0.3  PROT 6.2*  ALBUMIN 2.7*   No results for input(s): LIPASE, AMYLASE in the last 168 hours. No results for input(s): AMMONIA in the last 168 hours.  Coagulation Profile: No results  for input(s): INR, PROTIME in the last 168 hours.  Cardiac Enzymes: No results for input(s): CKTOTAL, CKMB, CKMBINDEX, TROPONINI in the last 168 hours.  BNP (last 3 results) No results for input(s): PROBNP in the last 8760 hours.  HbA1C: No results for input(s): HGBA1C in the last 72 hours.  CBG: Recent Labs  Lab 10/26/18 2109 10/27/18 2215 10/28/18 2133 10/29/18 2127 10/30/18 2239  GLUCAP 121* 115* 118* 117* 123*    Lipid Profile: No results for input(s): CHOL, HDL, LDLCALC, TRIG, CHOLHDL, LDLDIRECT in the last 72 hours.  Thyroid Function Tests: No results for input(s): TSH, T4TOTAL, FREET4, T3FREE, THYROIDAB in the last 72 hours.  Anemia Panel: No results for input(s): VITAMINB12, FOLATE, FERRITIN, TIBC, IRON, RETICCTPCT in the last 72 hours.  Urine analysis:    Component Value Date/Time   COLORURINE YELLOW 10/25/2018 1016   APPEARANCEUR CLEAR 10/25/2018 1016   LABSPEC 1.015 10/25/2018 1016   PHURINE 6.0 10/25/2018 1016   GLUCOSEU NEGATIVE 10/25/2018 1016   HGBUR NEGATIVE 10/25/2018 1016   BILIRUBINUR NEGATIVE 10/25/2018 1016   KETONESUR NEGATIVE 10/25/2018 1016   PROTEINUR NEGATIVE 10/25/2018 1016   NITRITE NEGATIVE 10/25/2018 1016   LEUKOCYTESUR NEGATIVE 10/25/2018 1016    Sepsis Labs: Lactic Acid, Venous    Component Value Date/Time   LATICACIDVEN 1.7 09/14/2018 0357     MICROBIOLOGY: Recent Results (from the past 240 hour(s))  Culture, blood (routine x 2)     Status: None   Collection Time: 10/25/18  8:25 AM   Specimen: BLOOD LEFT HAND  Result Value Ref Range Status   Specimen Description BLOOD LEFT HAND  Final   Special Requests   Final    BOTTLES DRAWN AEROBIC ONLY Blood Culture adequate volume   Culture   Final    NO GROWTH 5 DAYS Performed at [facility], [address], [place], [place] [address]    Report Status 10/30/2018 FINAL  Final  Culture, blood (routine x 2)     Status: None   Collection Time: 10/25/18  8:36 AM   Specimen: BLOOD RIGHT HAND  Result Value Ref Range Status   Specimen Description BLOOD RIGHT HAND  Final   Special Requests   Final    BOTTLES DRAWN AEROBIC ONLY Blood Culture adequate volume   Culture   Final    NO GROWTH 5 DAYS Performed at [facility], [address] [place], [place] [address]    Report Status 10/30/2018 FINAL  Final    RADIOLOGY STUDIES/RESULTS: Dg Chest Port 1 View  Result Date: 10/26/2018 CLINICAL DATA:  Pneumonia, tracheostomy EXAM: PORTABLE CHEST 1 VIEW COMPARISON:  10/25/2018 FINDINGS: Unchanged AP portable examination with bandlike scarring or atelectasis of the right midlung. No new or acute appearing airspace opacity. Unchanged elevation of the left hemidiaphragm. Tracheostomy. Heart and mediastinum are unremarkable. IMPRESSION: Unchanged AP portable examination with bandlike scarring or atelectasis of the right midlung. No new or acute appearing airspace opacity. Unchanged elevation of the left hemidiaphragm. Tracheostomy. Electronically Signed   By: Lauralyn Primes M.D.   On: 10/26/2018 11:51   Dg Chest Port 1 View  Result Date: 10/25/2018 CLINICAL DATA:  Fever EXAM: PORTABLE CHEST 1 VIEW COMPARISON:  09/30/2018 FINDINGS: Lungs are well aerated and clear. Negative for pneumonia. Negative for heart failure or effusion. Tracheostomy remains in good position. IMPRESSION: No active  disease. Electronically Signed   By: Marlan Palau M.D.   On: 10/25/2018 10:06     LOS: 53 days  Jeoffrey Massed, MD  [facility]  If 7PM-7AM, please contact night-coverage  Please page via [email or web address]  Go to [email or web address] and use [place]'s universal password to access. If you do not have the password, please contact the hospital operator.  Locate the [facility] provider you are looking for under [facility] and page to a number that you can be directly reached. If you still have difficulty reaching the provider, please page the [facility] (Director on Call) for the Hospitalists listed on amion for assistance.  10/31/2018, 11:44 AM

## 2018-11-01 LAB — CBC
HCT: 36.4 % — ABNORMAL LOW (ref 39.0–52.0)
Hemoglobin: 12 g/dL — ABNORMAL LOW (ref 13.0–17.0)
MCH: 26.7 pg (ref 26.0–34.0)
MCHC: 33 g/dL (ref 30.0–36.0)
MCV: 80.9 fL (ref 80.0–100.0)
Platelets: 298 10*3/uL (ref 150–400)
RBC: 4.5 MIL/uL (ref 4.22–5.81)
RDW: 14.1 % (ref 11.5–15.5)
WBC: 8.7 10*3/uL (ref 4.0–10.5)
nRBC: 0 % (ref 0.0–0.2)

## 2018-11-01 LAB — COMPREHENSIVE METABOLIC PANEL
ALT: 22 U/L (ref 0–44)
AST: 24 U/L (ref 15–41)
Albumin: 2.9 g/dL — ABNORMAL LOW (ref 3.5–5.0)
Alkaline Phosphatase: 97 U/L (ref 38–126)
Anion gap: 11 (ref 5–15)
BUN: 28 mg/dL — ABNORMAL HIGH (ref 8–23)
CO2: 23 mmol/L (ref 22–32)
Calcium: 9.5 mg/dL (ref 8.9–10.3)
Chloride: 104 mmol/L (ref 98–111)
Creatinine, Ser: 0.9 mg/dL (ref 0.61–1.24)
GFR calc Af Amer: >60 mL/min (ref >60)
GFR calc non Af Amer: >60 mL/min (ref >60)
Glucose, Bld: 118 mg/dL — ABNORMAL HIGH (ref 70–99)
Potassium: 3.9 mmol/L (ref 3.5–5.1)
Sodium: 138 mmol/L (ref 135–145)
Total Bilirubin: 0.5 mg/dL (ref 0.3–1.2)
Total Protein: 6.9 g/dL (ref 6.5–8.1)

## 2018-11-01 LAB — GLUCOSE, CAPILLARY: Glucose-Capillary: 146 mg/dL — ABNORMAL HIGH (ref 70–99)

## 2018-11-01 NOTE — Progress Notes (Signed)
PROGRESS NOTE        PATIENT DETAILS Name: Micheal Carey Age: 68 y.o. Sex: male Date of Birth: 20-May-1950 Admit Date: 09/08/2018 Admitting Physician Alyson Reedy, MD [id number], [name], MD  Brief Narrative: Patient is a 69 y.o. male with history of COPD, EtOH dependence-presented with PEA arrest with ROSC after 10 minutes.  Unfortunately patient remained unresponsive after ROSC-was intubated and admitted to the ICU.  Unfortunately-Hospital course has been prolonged-due to severe anoxic encephalopathy-patient is now s/p PEG and trach.  See below for further details.  Subjective: Lying comfortably in bed-occasionally awakes to verbal stimuli.  Mumbles incoherently.  Really does not follow commands persistently.  Assessment/Plan: PEA arrest with anoxic encephalopathy and persistent vegetative state: Remains stable-continue Seroquel, Klonopin and Lyrica.  Remains on tube feeds.  Palliative care following-full scope of treatment recommended at this point per family's wishes.  Awaiting SNF.  Acute hypoxic respiratory failure: intubated on admit-now s/p trach  Acinetobacter/Citrobacter PNA/UTI: Herbert Deaner antimicrobial therapy\  HTN: controlled-continue Coreg  COPD: stable  Stage II pressure injury, left heel, not POA   Nutrition Problem:  Nutrition Problem: Moderate Malnutrition Etiology: social / environmental circumstances(Hx substance abuse) Signs/Symptoms: moderate muscle depletion, moderate fat depletion Interventions: Tube feeding, Prostat, Juven, MVI    Diet: Diet Order    None       DVT Prophylaxis: Prophylactic Lovenox   Code Status: Full code   Family Communication: None at bedside  Disposition Plan: Remain inpatient0-awaiting SNF bed  Antimicrobial agents: Anti-infectives (From admission, onward)   Start     Dose/Rate Route Frequency Ordered Stop   09/30/18 1400  meropenem (MERREM) 1 g in sodium chloride 0.9 % 100 mL IVPB      1 g 200 mL/hr over 30 Minutes Intravenous Every 8 hours 09/30/18 1119 10/07/18 0539   09/27/18 1200  ceFEPIme (MAXIPIME) 2 g in sodium chloride 0.9 % 100 mL IVPB  Status:  Discontinued     2 g 200 mL/hr over 30 Minutes Intravenous Every 8 hours 09/27/18 1133 09/30/18 1119   09/18/18 1330  ceFAZolin (ANCEF) IVPB 2g/100 mL premix     2 g 200 mL/hr over 30 Minutes Intravenous To Radiology 09/18/18 1323 09/18/18 1356   09/17/18 0900  ceFAZolin (ANCEF) IVPB 2g/100 mL premix     2 g 200 mL/hr over 30 Minutes Intravenous To Radiology 09/17/18 0849 09/18/18 0900   09/14/18 1000  cefTRIAXone (ROCEPHIN) 2 g in sodium chloride 0.9 % 100 mL IVPB  Status:  Discontinued     2 g 200 mL/hr over 30 Minutes Intravenous Every 24 hours 09/14/18 0919 09/16/18 1040   09/12/18 0000  vancomycin (VANCOCIN) 1,250 mg in sodium chloride 0.9 % 250 mL IVPB  Status:  Discontinued     1,250 mg 166.7 mL/hr over 90 Minutes Intravenous Every 24 hours 09/11/18 0044 09/11/18 1229   09/12/18 0000  vancomycin (VANCOCIN) IVPB 1000 mg/200 mL premix  Status:  Discontinued     1,000 mg 200 mL/hr over 60 Minutes Intravenous Every 24 hours 09/11/18 1229 09/13/18 1117   09/11/18 0800  piperacillin-tazobactam (ZOSYN) IVPB 3.375 g  Status:  Discontinued     3.375 g 12.5 mL/hr over 240 Minutes Intravenous Every 8 hours 09/11/18 0044 09/13/18 1117   09/11/18 0045  piperacillin-tazobactam (ZOSYN) IVPB 3.375 g     3.375 g 100 mL/hr over 30 Minutes Intravenous  Once 09/11/18 0040 09/11/18 0232   09/11/18 0045  vancomycin (VANCOCIN) 1,500 mg in sodium chloride 0.9 % 500 mL IVPB     1,500 mg 250 mL/hr over 120 Minutes Intravenous  Once 09/11/18 0040 09/11/18 0447      Procedures: 817>>peg 8/19>>trach  CONSULTS:  pulmonary/intensive care and neurology  Time spent: 15- minutes-Greater than 50% of this time was spent in counseling, explanation of diagnosis, planning of further management, and coordination of care.  MEDICATIONS:  Scheduled Meds: . carvedilol  6.25 mg Per Tube BID WC  . chlorhexidine  15 mL Mouth Rinse BID  . clonazePAM  0.5 mg Per Tube TID  . enoxaparin (LOVENOX) injection  40 mg Subcutaneous Q24H  . feeding supplement (PRO-STAT SUGAR FREE 64)  30 mL Per Tube BID  . folic acid  1 mg Per Tube Daily  . free water  200 mL Per Tube Q6H  . Gerhardt's butt cream   Topical QID  . guaiFENesin  5 mL Per Tube Q6H  . mouth rinse  15 mL Mouth Rinse q12n4p  . multivitamin  15 mL Per Tube Daily  . nutrition supplement (JUVEN)  1 packet Per Tube BID BM  . pregabalin  25 mg Per Tube BID  . QUEtiapine  25 mg Per Tube QHS  . thiamine  100 mg Per Tube Daily   Continuous Infusions: . sodium chloride Stopped (10/08/18 2212)  . feeding supplement (JEVITY 1.2 CAL) 60 mL/hr at 11/01/18 0849   PRN Meds:.sodium chloride, acetaminophen, albuterol, bisacodyl, docusate, fentaNYL (SUBLIMAZE) injection, Gerhardt's butt cream, LORazepam, ondansetron **OR** ondansetron (ZOFRAN) IV, oxyCODONE, pneumococcal 23 valent vaccine   PHYSICAL EXAM: Vital signs: Vitals:   11/01/18 0711 11/01/18 0823 11/01/18 1137 11/01/18 1151  BP:  133/74    Pulse: 96 96 96 98  Resp:  19 (!) 22   Temp:  98.1 F (36.7 C)  98.3 F (36.8 C)  TempSrc:  Axillary  Axillary  SpO2:  97%    Weight:      Height:       Filed Weights   10/25/18 0011 10/31/18 0403 11/01/18 0420  Weight: 63.3 kg 64.6 kg 58.6 kg   Body mass index is 19.64 kg/m.   Gen Exam:awake-not in distress HEENT:atraumatic, normocephalic Chest: B/L clear to auscultation anteriorly CVS:S1S2 regular Abdomen:soft non tender, non distended Extremities:no edema Skin: no rash  I have personally reviewed following labs and imaging studies  LABORATORY DATA: CBC: Recent Labs  Lab 10/26/18 0211 10/27/18 0304 10/29/18 0139 11/01/18 0135  WBC 7.4 6.7 8.7 8.7  NEUTROABS  --  4.0  --   --   HGB 11.4* 11.7* 11.9* 12.0*  HCT 34.5* 34.0* 35.2* 36.4*  MCV 80.8 80.8 79.8* 80.9   PLT 243 265 296 298    Basic Metabolic Panel: Recent Labs  Lab 10/26/18 0211 10/27/18 0304 11/01/18 0135  NA 138 139 138  K 4.2 4.1 3.9  CL 105 105 104  CO2 24 24 23   GLUCOSE 105* 100* 118*  BUN 28* 26* 28*  CREATININE 0.81 0.82 0.90  CALCIUM 9.0 9.3 9.5  MG 1.9  --   --     GFR: Estimated Creatinine Clearance: 66.9 mL/min (by C-G formula based on SCr of 0.9 mg/dL).  Liver Function Tests: Recent Labs  Lab 10/26/18 0211 11/01/18 0135  AST 27 24  ALT 21 22  ALKPHOS 101 97  BILITOT 0.3 0.5  PROT 6.2* 6.9  ALBUMIN 2.7* 2.9*   No results for input(s):  LIPASE, AMYLASE in the last 168 hours. No results for input(s): AMMONIA in the last 168 hours.  Coagulation Profile: No results for input(s): INR, PROTIME in the last 168 hours.  Cardiac Enzymes: No results for input(s): CKTOTAL, CKMB, CKMBINDEX, TROPONINI in the last 168 hours.  BNP (last 3 results) No results for input(s): PROBNP in the last 8760 hours.  HbA1C: No results for input(s): HGBA1C in the last 72 hours.  CBG: Recent Labs  Lab 10/27/18 2215 10/28/18 2133 10/29/18 2127 10/30/18 2239 10/31/18 2120  GLUCAP 115* 118* 117* 123* 105*    Lipid Profile: No results for input(s): CHOL, HDL, LDLCALC, TRIG, CHOLHDL, LDLDIRECT in the last 72 hours.  Thyroid Function Tests: No results for input(s): TSH, T4TOTAL, FREET4, T3FREE, THYROIDAB in the last 72 hours.  Anemia Panel: No results for input(s): VITAMINB12, FOLATE, FERRITIN, TIBC, IRON, RETICCTPCT in the last 72 hours.  Urine analysis:    Component Value Date/Time   COLORURINE YELLOW 10/25/2018 1016   APPEARANCEUR CLEAR 10/25/2018 1016   LABSPEC 1.015 10/25/2018 1016   PHURINE 6.0 10/25/2018 1016   GLUCOSEU NEGATIVE 10/25/2018 1016   HGBUR NEGATIVE 10/25/2018 1016   BILIRUBINUR NEGATIVE 10/25/2018 1016   KETONESUR NEGATIVE 10/25/2018 1016   PROTEINUR NEGATIVE 10/25/2018 1016   NITRITE NEGATIVE 10/25/2018 1016   LEUKOCYTESUR NEGATIVE  10/25/2018 1016    Sepsis Labs: Lactic Acid, Venous    Component Value Date/Time   LATICACIDVEN 1.7 09/14/2018 0357    MICROBIOLOGY: Recent Results (from the past 240 hour(s))  Culture, blood (routine x 2)     Status: None   Collection Time: 10/25/18  8:25 AM   Specimen: BLOOD LEFT HAND  Result Value Ref Range Status   Specimen Description BLOOD LEFT HAND  Final   Special Requests   Final    BOTTLES DRAWN AEROBIC ONLY Blood Culture adequate volume   Culture   Final    NO GROWTH 5 DAYS Performed at [facility], [address], [place], [place] [address]    Report Status 10/30/2018 FINAL  Final  Culture, blood (routine x 2)     Status: None   Collection Time: 10/25/18  8:36 AM   Specimen: BLOOD RIGHT HAND  Result Value Ref Range Status   Specimen Description BLOOD RIGHT HAND  Final   Special Requests   Final    BOTTLES DRAWN AEROBIC ONLY Blood Culture adequate volume   Culture   Final    NO GROWTH 5 DAYS Performed at [facility], [address], [place], [place] [address]    Report Status 10/30/2018 FINAL  Final    RADIOLOGY STUDIES/RESULTS: Dg Chest Port 1 View  Result Date: 10/26/2018 CLINICAL DATA:  Pneumonia, tracheostomy EXAM: PORTABLE CHEST 1 VIEW COMPARISON:  10/25/2018 FINDINGS: Unchanged AP portable examination with bandlike scarring or atelectasis of the right midlung. No new or acute appearing airspace opacity. Unchanged elevation of the left hemidiaphragm. Tracheostomy. Heart and mediastinum are unremarkable. IMPRESSION: Unchanged AP portable examination with bandlike scarring or atelectasis of the right midlung. No new or acute appearing airspace opacity. Unchanged elevation of the left hemidiaphragm. Tracheostomy. Electronically Signed   By: Lauralyn Primes M.D.   On: 10/26/2018 11:51   Dg Chest Port 1 View  Result Date: 10/25/2018 CLINICAL DATA:  Fever EXAM: PORTABLE CHEST 1 VIEW COMPARISON:  09/30/2018 FINDINGS: Lungs are well aerated and clear.  Negative for pneumonia. Negative for heart failure or effusion. Tracheostomy remains in good position. IMPRESSION: No active disease. Electronically  Signed   By: Franchot Gallo M.D.   On: 10/25/2018 10:06     LOS: 52 days   Oren Binet, MD  [facility]  If 7PM-7AM, please contact night-coverage  Please page via [email or web address]  Go to [email or web address] and use [place]'s universal password to access. If you do not have the password, please contact the hospital operator.  Locate the [facility] provider you are looking for under [facility] and page to a number that you can be directly reached. If you still have difficulty reaching the provider, please page the [facility] (Director on Call) for the Hospitalists listed on [email or web address] for assistance.  11/01/2018, 1:32 PM

## 2018-11-02 LAB — GLUCOSE, CAPILLARY: Glucose-Capillary: 130 mg/dL — ABNORMAL HIGH (ref 70–99)

## 2018-11-02 NOTE — Progress Notes (Signed)
Palliative progress:   Patient in bed. Appears comfortable. Awaiting placement. Contacted patient's son. Continues to be hopeful for placement and rehab- however, patient does not follow commands, and would not benefit from therapy services. Therefore, son understands that placement is for custodial care. PMT is shadowing and touching base with family for point of contact and support, answering questions and family was not interested in [place] discussion. Micheal Carey stated he had completed paperwork for [facility] and returned hoping to get patient onto waiting list there.   PMT will shadow and intervene if necessary.  Time In: 1330 Time Out:1400 Total Time:30 mins  Greater than 50%  of this time was spent counseling and coordinating care related to the above assessment and plan   Micheal Carey, AGNP-C Palliative Medicine  Please call Palliative Medicine team phone with any questions [phone]. For individual providers please see AMION.

## 2018-11-02 NOTE — Progress Notes (Signed)
PROGRESS NOTE        PATIENT DETAILS Name: Micheal Carey Age: 68 y.o. Sex: male Date of Birth: January 17, 1951 Admit Date: 09/08/2018 Admitting Physician Alyson ReedyWesam G Yacoub, MD ZOX:[id number], Dorma RussellEdwin, MD  Brief Narrative: Patient is a 68 y.o. male with history of COPD, EtOH dependence-presented with PEA arrest with ROSC after 10 minutes.  Unfortunately patient remained unresponsive after ROSC-was intubated and admitted to the ICU.  Unfortunately-Hospital course has been prolonged-due to severe anoxic encephalopathy-patient is now s/p PEG and trach.  See below for further details.  Subjective: Lying comfortably in bed, no major issues overnight per RN.  Assessment/Plan: PEA arrest with anoxic encephalopathy and persistent vegetative state: Remains stable-continue Seroquel, Klonopin and Lyrica.  Remains on tube feeds.  Palliative care following-full scope of treatment recommended at this point per family's wishes.  Awaiting SNF.  Acute hypoxic respiratory failure: intubated on admit-now s/p trach  Acinetobacter/Citrobacter PNA/UTI: Herbert Deanerresolved-off antimicrobial therapy  HTN: controlled-continue Coreg  COPD: stable  Stage II pressure injury, left heel, not POA   Nutrition Problem:  Nutrition Problem: Moderate Malnutrition Etiology: social / environmental circumstances(Hx substance abuse) Signs/Symptoms: moderate muscle depletion, moderate fat depletion Interventions: Tube feeding, Prostat, Juven, MVI    Diet: Diet Order    None       DVT Prophylaxis: Prophylactic Lovenox   Code Status: Full code   Family Communication: None at bedside  Disposition Plan: Remain inpatient-awaiting SNF bed-SW following  Antimicrobial agents: Anti-infectives (From admission, onward)   Start     Dose/Rate Route Frequency Ordered Stop   09/30/18 1400  meropenem (MERREM) 1 g in sodium chloride 0.9 % 100 mL IVPB     1 g 200 mL/hr over 30 Minutes Intravenous Every 8 hours  09/30/18 1119 10/07/18 0539   09/27/18 1200  ceFEPIme (MAXIPIME) 2 g in sodium chloride 0.9 % 100 mL IVPB  Status:  Discontinued     2 g 200 mL/hr over 30 Minutes Intravenous Every 8 hours 09/27/18 1133 09/30/18 1119   09/18/18 1330  ceFAZolin (ANCEF) IVPB 2g/100 mL premix     2 g 200 mL/hr over 30 Minutes Intravenous To Radiology 09/18/18 1323 09/18/18 1356   09/17/18 0900  ceFAZolin (ANCEF) IVPB 2g/100 mL premix     2 g 200 mL/hr over 30 Minutes Intravenous To Radiology 09/17/18 0849 09/18/18 0900   09/14/18 1000  cefTRIAXone (ROCEPHIN) 2 g in sodium chloride 0.9 % 100 mL IVPB  Status:  Discontinued     2 g 200 mL/hr over 30 Minutes Intravenous Every 24 hours 09/14/18 0919 09/16/18 1040   09/12/18 0000  vancomycin (VANCOCIN) 1,250 mg in sodium chloride 0.9 % 250 mL IVPB  Status:  Discontinued     1,250 mg 166.7 mL/hr over 90 Minutes Intravenous Every 24 hours 09/11/18 0044 09/11/18 1229   09/12/18 0000  vancomycin (VANCOCIN) IVPB 1000 mg/200 mL premix  Status:  Discontinued     1,000 mg 200 mL/hr over 60 Minutes Intravenous Every 24 hours 09/11/18 1229 09/13/18 1117   09/11/18 0800  piperacillin-tazobactam (ZOSYN) IVPB 3.375 g  Status:  Discontinued     3.375 g 12.5 mL/hr over 240 Minutes Intravenous Every 8 hours 09/11/18 0044 09/13/18 1117   09/11/18 0045  piperacillin-tazobactam (ZOSYN) IVPB 3.375 g     3.375 g 100 mL/hr over 30 Minutes Intravenous  Once 09/11/18 0040 09/11/18 0232  09/11/18 0045  vancomycin (VANCOCIN) 1,500 mg in sodium chloride 0.9 % 500 mL IVPB     1,500 mg 250 mL/hr over 120 Minutes Intravenous  Once 09/11/18 0040 09/11/18 0447      Procedures: 817>>peg 8/19>>trach  CONSULTS:  pulmonary/intensive care and neurology  Time spent: 15- minutes-Greater than 50% of this time was spent in counseling, explanation of diagnosis, planning of further management, and coordination of care.  MEDICATIONS: Scheduled Meds: . carvedilol  6.25 mg Per Tube BID WC  .  chlorhexidine  15 mL Mouth Rinse BID  . clonazePAM  0.5 mg Per Tube TID  . enoxaparin (LOVENOX) injection  40 mg Subcutaneous Q24H  . feeding supplement (PRO-STAT SUGAR FREE 64)  30 mL Per Tube BID  . folic acid  1 mg Per Tube Daily  . free water  200 mL Per Tube Q6H  . Gerhardt's butt cream   Topical QID  . guaiFENesin  5 mL Per Tube Q6H  . mouth rinse  15 mL Mouth Rinse q12n4p  . multivitamin  15 mL Per Tube Daily  . nutrition supplement (JUVEN)  1 packet Per Tube BID BM  . pregabalin  25 mg Per Tube BID  . QUEtiapine  25 mg Per Tube QHS  . thiamine  100 mg Per Tube Daily   Continuous Infusions: . sodium chloride Stopped (10/08/18 2212)  . feeding supplement (JEVITY 1.2 CAL) 60 mL/hr at 11/01/18 2000   PRN Meds:.sodium chloride, acetaminophen, albuterol, bisacodyl, docusate, fentaNYL (SUBLIMAZE) injection, Gerhardt's butt cream, LORazepam, ondansetron **OR** ondansetron (ZOFRAN) IV, oxyCODONE, pneumococcal 23 valent vaccine   PHYSICAL EXAM: Vital signs: Vitals:   11/02/18 0546 11/02/18 0601 11/02/18 0742 11/02/18 0746  BP:   (!) 136/96   Pulse: 91     Resp: (!) 22  (!) 21 (!) 23  Temp:   99 F (37.2 C)   TempSrc:   Oral   SpO2: 96%  97% 96%  Weight:  58.5 kg    Height:       Filed Weights   10/31/18 0403 11/01/18 0420 11/02/18 0601  Weight: 64.6 kg 58.6 kg 58.5 kg   Body mass index is 19.61 kg/m.   Gen Exam:awake-not in distress HEENT:atraumatic, normocephalic Chest: B/L clear to auscultation anteriorly CVS:S1S2 regular Abdomen:soft non tender, non distended Extremities:no edema Skin: no rash  I have personally reviewed following labs and imaging studies  LABORATORY DATA: CBC: Recent Labs  Lab 10/27/18 0304 10/29/18 0139 11/01/18 0135  WBC 6.7 8.7 8.7  NEUTROABS 4.0  --   --   HGB 11.7* 11.9* 12.0*  HCT 34.0* 35.2* 36.4*  MCV 80.8 79.8* 80.9  PLT 265 296 298    Basic Metabolic Panel: Recent Labs  Lab 10/27/18 0304 11/01/18 0135  NA 139 138   K 4.1 3.9  CL 105 104  CO2 24 23  GLUCOSE 100* 118*  BUN 26* 28*  CREATININE 0.82 0.90  CALCIUM 9.3 9.5    GFR: Estimated Creatinine Clearance: 66.8 mL/min (by C-G formula based on SCr of 0.9 mg/dL).  Liver Function Tests: Recent Labs  Lab 11/01/18 0135  AST 24  ALT 22  ALKPHOS 97  BILITOT 0.5  PROT 6.9  ALBUMIN 2.9*   No results for input(s): LIPASE, AMYLASE in the last 168 hours. No results for input(s): AMMONIA in the last 168 hours.  Coagulation Profile: No results for input(s): INR, PROTIME in the last 168 hours.  Cardiac Enzymes: No results for input(s): CKTOTAL, CKMB, CKMBINDEX, TROPONINI in  the last 168 hours.  BNP (last 3 results) No results for input(s): PROBNP in the last 8760 hours.  HbA1C: No results for input(s): HGBA1C in the last 72 hours.  CBG: Recent Labs  Lab 10/28/18 2133 10/29/18 2127 10/30/18 2239 10/31/18 2120 11/01/18 2054  GLUCAP 118* 117* 123* 105* 146*    Lipid Profile: No results for input(s): CHOL, HDL, LDLCALC, TRIG, CHOLHDL, LDLDIRECT in the last 72 hours.  Thyroid Function Tests: No results for input(s): TSH, T4TOTAL, FREET4, T3FREE, THYROIDAB in the last 72 hours.  Anemia Panel: No results for input(s): VITAMINB12, FOLATE, FERRITIN, TIBC, IRON, RETICCTPCT in the last 72 hours.  Urine analysis:    Component Value Date/Time   COLORURINE YELLOW 10/25/2018 1016   APPEARANCEUR CLEAR 10/25/2018 1016   LABSPEC 1.015 10/25/2018 1016   PHURINE 6.0 10/25/2018 1016   GLUCOSEU NEGATIVE 10/25/2018 1016   HGBUR NEGATIVE 10/25/2018 1016   BILIRUBINUR NEGATIVE 10/25/2018 1016   KETONESUR NEGATIVE 10/25/2018 1016   PROTEINUR NEGATIVE 10/25/2018 1016   NITRITE NEGATIVE 10/25/2018 1016   LEUKOCYTESUR NEGATIVE 10/25/2018 1016    Sepsis Labs: Lactic Acid, Venous    Component Value Date/Time   LATICACIDVEN 1.7 09/14/2018 0357    MICROBIOLOGY: Recent Results (from the past 240 hour(s))  Culture, blood (routine x 2)      Status: None   Collection Time: 10/25/18  8:25 AM   Specimen: BLOOD LEFT HAND  Result Value Ref Range Status   Specimen Description BLOOD LEFT HAND  Final   Special Requests   Final    BOTTLES DRAWN AEROBIC ONLY Blood Culture adequate volume   Culture   Final    NO GROWTH 5 DAYS Performed at [facility], [place] [address] [place], [place] [address]    Report Status 10/30/2018 FINAL  Final  Culture, blood (routine x 2)     Status: None   Collection Time: 10/25/18  8:36 AM   Specimen: BLOOD RIGHT HAND  Result Value Ref Range Status   Specimen Description BLOOD RIGHT HAND  Final   Special Requests   Final    BOTTLES DRAWN AEROBIC ONLY Blood Culture adequate volume   Culture   Final    NO GROWTH 5 DAYS Performed at [facility], [place] [address] [place], [place] [address]    Report Status 10/30/2018 FINAL  Final    RADIOLOGY STUDIES/RESULTS: Dg Chest Port 1 View  Result Date: 10/26/2018 CLINICAL DATA:  Pneumonia, tracheostomy EXAM: PORTABLE CHEST 1 VIEW COMPARISON:  10/25/2018 FINDINGS: Unchanged AP portable examination with bandlike scarring or atelectasis of the right midlung. No new or acute appearing airspace opacity. Unchanged elevation of the left hemidiaphragm. Tracheostomy. Heart and mediastinum are unremarkable. IMPRESSION: Unchanged AP portable examination with bandlike scarring or atelectasis of the right midlung. No new or acute appearing airspace opacity. Unchanged elevation of the left hemidiaphragm. Tracheostomy. Electronically Signed   By: Eddie Candle M.D.   On: 10/26/2018 11:51   Dg Chest Port 1 View  Result Date: 10/25/2018 CLINICAL DATA:  Fever EXAM: PORTABLE CHEST 1 VIEW COMPARISON:  09/30/2018 FINDINGS: Lungs are well aerated and clear. Negative for pneumonia. Negative for heart failure or effusion. Tracheostomy remains in good position. IMPRESSION: No active disease. Electronically Signed   By: Franchot Gallo M.D.   On: 10/25/2018 10:06     LOS: 12  days   Oren Binet, MD  [facility]  If 7PM-7AM, please contact night-coverage  Please page via [email or web address]  Go to [email or web address]  and use [facility]'s universal password to access. If you do not have the password, please contact the hospital operator.  Locate the [facility] provider you are looking for under [facility] and page to a number that you can be directly reached. If you still have difficulty reaching the provider, please page the [facility] (Director on Call) for the Hospitalists listed on amion for assistance.  11/02/2018, 8:05 AM

## 2018-11-03 LAB — GLUCOSE, CAPILLARY: Glucose-Capillary: 120 mg/dL — ABNORMAL HIGH (ref 70–99)

## 2018-11-03 NOTE — Progress Notes (Signed)
PROGRESS NOTE        PATIENT DETAILS Name: Micheal Carey Age: 68 y.o. Sex: male Date of Birth: [birth year] Admit Date: 09/08/2018 Admitting Physician Rush Farmer, MD [id number], Christean Grief, MD  Brief Narrative: Patient is a 68 y.o. male with history of COPD, EtOH dependence-presented with PEA arrest with ROSC after 10 minutes.  Unfortunately patient remained unresponsive after ROSC-was intubated and admitted to the ICU.  Unfortunately-Hospital course has been prolonged-due to severe anoxic encephalopathy-patient is now s/p PEG and trach.  See below for further details.  Subjective: Lying comfortably in bed-no major issues overnight.  Remains essentially unchanged over the past few days  Assessment/Plan: PEA arrest with anoxic encephalopathy and persistent vegetative state: Remains stable-continue Seroquel, Klonopin and Lyrica.  Remains on tube feeds.  Palliative care following-full scope of treatment recommended at this point per family's wishes.  Awaiting SNF.  Acute hypoxic respiratory failure: intubated on admit-now s/p trach  Acinetobacter/Citrobacter PNA/UTI: Fanny Skates antimicrobial therapy  HTN: controlled-continue Coreg  COPD: stable  Stage II pressure injury, left heel, not POA   Nutrition Problem:  Nutrition Problem: Moderate Malnutrition Etiology: social / environmental circumstances(Hx substance abuse) Signs/Symptoms: moderate muscle depletion, moderate fat depletion Interventions: Tube feeding, Prostat, Juven, MVI    Diet: Diet Order    None       DVT Prophylaxis: Prophylactic Lovenox   Code Status: Full code   Family Communication: None at bedside  Disposition Plan: Remain inpatient-awaiting SNF bed-SW following  Antimicrobial agents: Anti-infectives (From admission, onward)   Start     Dose/Rate Route Frequency Ordered Stop   09/30/18 1400  meropenem (MERREM) 1 g in sodium chloride 0.9 % 100 mL IVPB     1 g 200  mL/hr over 30 Minutes Intravenous Every 8 hours 09/30/18 1119 10/07/18 0539   09/27/18 1200  ceFEPIme (MAXIPIME) 2 g in sodium chloride 0.9 % 100 mL IVPB  Status:  Discontinued     2 g 200 mL/hr over 30 Minutes Intravenous Every 8 hours 09/27/18 1133 09/30/18 1119   09/18/18 1330  ceFAZolin (ANCEF) IVPB 2g/100 mL premix     2 g 200 mL/hr over 30 Minutes Intravenous To Radiology 09/18/18 1323 09/18/18 1356   09/17/18 0900  ceFAZolin (ANCEF) IVPB 2g/100 mL premix     2 g 200 mL/hr over 30 Minutes Intravenous To Radiology 09/17/18 0849 09/18/18 0900   09/14/18 1000  cefTRIAXone (ROCEPHIN) 2 g in sodium chloride 0.9 % 100 mL IVPB  Status:  Discontinued     2 g 200 mL/hr over 30 Minutes Intravenous Every 24 hours 09/14/18 0919 09/16/18 1040   09/12/18 0000  vancomycin (VANCOCIN) 1,250 mg in sodium chloride 0.9 % 250 mL IVPB  Status:  Discontinued     1,250 mg 166.7 mL/hr over 90 Minutes Intravenous Every 24 hours 09/11/18 0044 09/11/18 1229   09/12/18 0000  vancomycin (VANCOCIN) IVPB 1000 mg/200 mL premix  Status:  Discontinued     1,000 mg 200 mL/hr over 60 Minutes Intravenous Every 24 hours 09/11/18 1229 09/13/18 1117   09/11/18 0800  piperacillin-tazobactam (ZOSYN) IVPB 3.375 g  Status:  Discontinued     3.375 g 12.5 mL/hr over 240 Minutes Intravenous Every 8 hours 09/11/18 0044 09/13/18 1117   09/11/18 0045  piperacillin-tazobactam (ZOSYN) IVPB 3.375 g     3.375 g 100 mL/hr over 30 Minutes Intravenous  Once 09/11/18 0040 09/11/18 0232   09/11/18 0045  vancomycin (VANCOCIN) 1,500 mg in sodium chloride 0.9 % 500 mL IVPB     1,500 mg 250 mL/hr over 120 Minutes Intravenous  Once 09/11/18 0040 09/11/18 0447      Procedures: 817>>peg 8/19>>trach  CONSULTS:  pulmonary/intensive care and neurology  Time spent: 15- minutes-Greater than 50% of this time was spent in counseling, explanation of diagnosis, planning of further management, and coordination of care.  MEDICATIONS: Scheduled  Meds: . carvedilol  6.25 mg Per Tube BID WC  . chlorhexidine  15 mL Mouth Rinse BID  . clonazePAM  0.5 mg Per Tube TID  . enoxaparin (LOVENOX) injection  40 mg Subcutaneous Q24H  . feeding supplement (PRO-STAT SUGAR FREE 64)  30 mL Per Tube BID  . folic acid  1 mg Per Tube Daily  . free water  200 mL Per Tube Q6H  . Gerhardt's butt cream   Topical QID  . guaiFENesin  5 mL Per Tube Q6H  . mouth rinse  15 mL Mouth Rinse q12n4p  . multivitamin  15 mL Per Tube Daily  . nutrition supplement (JUVEN)  1 packet Per Tube BID BM  . pregabalin  25 mg Per Tube BID  . QUEtiapine  25 mg Per Tube QHS  . thiamine  100 mg Per Tube Daily   Continuous Infusions: . sodium chloride Stopped (10/08/18 2212)  . feeding supplement (JEVITY 1.2 CAL) 60 mL/hr at 11/02/18 2000   PRN Meds:.sodium chloride, acetaminophen, albuterol, bisacodyl, docusate, fentaNYL (SUBLIMAZE) injection, Gerhardt's butt cream, LORazepam, ondansetron **OR** ondansetron (ZOFRAN) IV, oxyCODONE, pneumococcal 23 valent vaccine   PHYSICAL EXAM: Vital signs: Vitals:   11/03/18 0500 11/03/18 0700 11/03/18 0741 11/03/18 0745  BP:  (!) 138/93 (!) 148/90   Pulse:   (!) 117   Resp:  18 (!) 23 (!) 23  Temp:   99.4 F (37.4 C)   TempSrc:   Axillary   SpO2:  98% 99% 98%  Weight: 61.7 kg     Height:       Filed Weights   11/01/18 0420 11/02/18 0601 11/03/18 0500  Weight: 58.6 kg 58.5 kg 61.7 kg   Body mass index is 20.68 kg/m.   Gen Exam:awake-not in distress HEENT:atraumatic, normocephalic Chest: B/L clear to auscultation anteriorly CVS:S1S2 regular Abdomen:soft non tender, non distended Extremities:no edema Skin: no rash  I have personally reviewed following labs and imaging studies  LABORATORY DATA: CBC: Recent Labs  Lab 10/29/18 0139 11/01/18 0135  WBC 8.7 8.7  HGB 11.9* 12.0*  HCT 35.2* 36.4*  MCV 79.8* 80.9  PLT 296 298    Basic Metabolic Panel: Recent Labs  Lab 11/01/18 0135  NA 138  K 3.9  CL 104   CO2 23  GLUCOSE 118*  BUN 28*  CREATININE 0.90  CALCIUM 9.5    GFR: Estimated Creatinine Clearance: 70.5 mL/min (by C-G formula based on SCr of 0.9 mg/dL).  Liver Function Tests: Recent Labs  Lab 11/01/18 0135  AST 24  ALT 22  ALKPHOS 97  BILITOT 0.5  PROT 6.9  ALBUMIN 2.9*   No results for input(s): LIPASE, AMYLASE in the last 168 hours. No results for input(s): AMMONIA in the last 168 hours.  Coagulation Profile: No results for input(s): INR, PROTIME in the last 168 hours.  Cardiac Enzymes: No results for input(s): CKTOTAL, CKMB, CKMBINDEX, TROPONINI in the last 168 hours.  BNP (last 3 results) No results for input(s): PROBNP in the last 8760  hours.  HbA1C: No results for input(s): HGBA1C in the last 72 hours.  CBG: Recent Labs  Lab 10/29/18 2127 10/30/18 2239 10/31/18 2120 11/01/18 2054 11/02/18 2100  GLUCAP 117* 123* 105* 146* 130*    Lipid Profile: No results for input(s): CHOL, HDL, LDLCALC, TRIG, CHOLHDL, LDLDIRECT in the last 72 hours.  Thyroid Function Tests: No results for input(s): TSH, T4TOTAL, FREET4, T3FREE, THYROIDAB in the last 72 hours.  Anemia Panel: No results for input(s): VITAMINB12, FOLATE, FERRITIN, TIBC, IRON, RETICCTPCT in the last 72 hours.  Urine analysis:    Component Value Date/Time   COLORURINE YELLOW 10/25/2018 1016   APPEARANCEUR CLEAR 10/25/2018 1016   LABSPEC 1.015 10/25/2018 1016   PHURINE 6.0 10/25/2018 1016   GLUCOSEU NEGATIVE 10/25/2018 1016   HGBUR NEGATIVE 10/25/2018 1016   BILIRUBINUR NEGATIVE 10/25/2018 1016   KETONESUR NEGATIVE 10/25/2018 1016   PROTEINUR NEGATIVE 10/25/2018 1016   NITRITE NEGATIVE 10/25/2018 1016   LEUKOCYTESUR NEGATIVE 10/25/2018 1016    Sepsis Labs: Lactic Acid, Venous    Component Value Date/Time   LATICACIDVEN 1.7 09/14/2018 0357    MICROBIOLOGY: Recent Results (from the past 240 hour(s))  Culture, blood (routine x 2)     Status: None   Collection Time: 10/25/18  8:25 AM    Specimen: BLOOD LEFT HAND  Result Value Ref Range Status   Specimen Description BLOOD LEFT HAND  Final   Special Requests   Final    BOTTLES DRAWN AEROBIC ONLY Blood Culture adequate volume   Culture   Final    NO GROWTH 5 DAYS Performed at [facility], [address] [place], [place] [address]    Report Status 10/30/2018 FINAL  Final  Culture, blood (routine x 2)     Status: None   Collection Time: 10/25/18  8:36 AM   Specimen: BLOOD RIGHT HAND  Result Value Ref Range Status   Specimen Description BLOOD RIGHT HAND  Final   Special Requests   Final    BOTTLES DRAWN AEROBIC ONLY Blood Culture adequate volume   Culture   Final    NO GROWTH 5 DAYS Performed at [facility], [address] [place], [place] [address]    Report Status 10/30/2018 FINAL  Final    RADIOLOGY STUDIES/RESULTS: Dg Chest Port 1 View  Result Date: 10/26/2018 CLINICAL DATA:  Pneumonia, tracheostomy EXAM: PORTABLE CHEST 1 VIEW COMPARISON:  10/25/2018 FINDINGS: Unchanged AP portable examination with bandlike scarring or atelectasis of the right midlung. No new or acute appearing airspace opacity. Unchanged elevation of the left hemidiaphragm. Tracheostomy. Heart and mediastinum are unremarkable. IMPRESSION: Unchanged AP portable examination with bandlike scarring or atelectasis of the right midlung. No new or acute appearing airspace opacity. Unchanged elevation of the left hemidiaphragm. Tracheostomy. Electronically Signed   By: Lauralyn Primes M.D.   On: 10/26/2018 11:51   Dg Chest Port 1 View  Result Date: 10/25/2018 CLINICAL DATA:  Fever EXAM: PORTABLE CHEST 1 VIEW COMPARISON:  09/30/2018 FINDINGS: Lungs are well aerated and clear. Negative for pneumonia. Negative for heart failure or effusion. Tracheostomy remains in good position. IMPRESSION: No active disease. Electronically Signed   By: Marlan Palau M.D.   On: 10/25/2018 10:06     LOS: 56 days   Jeoffrey Massed, MD  [facility]   If 7PM-7AM, please contact night-coverage  Please page via [email or web address]  Go to [email or web address] and use [facility] universal password to access. If you do not have the password, please contact the  hospital operator.  Locate the [facility] provider [facility] for under [facility] and page to a number that you can be directly reached. If you still have difficulty reaching the provider, please page the [facility] (Director on Call) for the Hospitalists listed on amion for assistance.  11/03/2018, 10:07 AM

## 2018-11-03 NOTE — Progress Notes (Signed)
Pt looks comfortable in bed, ST on tele, BP stable, peg feeding contd.. @60cc , daughter called via E-link and updated, nursing care continue, will continue to monitor the patient  Palma Holter, Therapist, sports

## 2018-11-03 NOTE — Progress Notes (Signed)
Assisted tele visit to patient with family member.  Ailin Rochford M, RN  

## 2018-11-04 ENCOUNTER — Inpatient Hospital Stay (HOSPITAL_COMMUNITY): Payer: Medicare Other

## 2018-11-04 LAB — GLUCOSE, CAPILLARY: Glucose-Capillary: 142 mg/dL — ABNORMAL HIGH (ref 70–99)

## 2018-11-04 NOTE — Progress Notes (Addendum)
PROGRESS NOTE        PATIENT DETAILS Name: Micheal Carey Age: 68 y.o. Sex: male Date of Birth: 1950-02-16 Admit Date: 09/08/2018 Admitting Physician Alyson ReedyWesam G Yacoub, MD UJW:[id number]:Avbuere, Dorma RussellEdwin, MD  Brief Narrative: Patient is a 68 y.o. male with history of COPD, EtOH dependence-presented with PEA arrest with ROSC after 10 minutes.  Unfortunately patient remained unresponsive after ROSC-was intubated and admitted to the ICU.  Unfortunately-Hospital course has been prolonged-due to severe anoxic encephalopathy-patient is now s/p PEG and trach.  See below for further details.  Subjective: One episode of vomiting last night but no major events overnight.  Assessment/Plan: PEA arrest with anoxic encephalopathy and persistent vegetative state: Remains stable-continue Seroquel, Klonopin and Lyrica.  Remains on tube feeds.  Palliative care following-full scope of treatment recommended at this point per family's wishes.  Awaiting SNF.  Acute hypoxic respiratory failure: intubated on admit-now s/p trach  Acinetobacter/Citrobacter PNA/UTI: resolved-off antimicrobial therapy.vomited last night-reviewed chest x-ray-see no clear-cut infiltrate-suspect he can be monitored off antimicrobial therapy-he does not have fever.  Will monitor closely-if febrile-he will need to be started on antimicrobial therapy-as he remains at high risk for aspiration.  HTN: controlled-continue Coreg  COPD: stable  Stage II pressure injury, left heel, not POA   Nutrition Problem:  Nutrition Problem: Moderate Malnutrition Etiology: social / environmental circumstances(Hx substance abuse) Signs/Symptoms: moderate muscle depletion, moderate fat depletion Interventions: Tube feeding, Prostat, Juven, MVI    Diet: Diet Order    None       DVT Prophylaxis: Prophylactic Lovenox   Code Status: Full code   Family Communication: None at bedside  Disposition Plan: Remain inpatient-awaiting  SNF bed-SW following  Antimicrobial agents: Anti-infectives (From admission, onward)   Start     Dose/Rate Route Frequency Ordered Stop   09/30/18 1400  meropenem (MERREM) 1 g in sodium chloride 0.9 % 100 mL IVPB     1 g 200 mL/hr over 30 Minutes Intravenous Every 8 hours 09/30/18 1119 10/07/18 0539   09/27/18 1200  ceFEPIme (MAXIPIME) 2 g in sodium chloride 0.9 % 100 mL IVPB  Status:  Discontinued     2 g 200 mL/hr over 30 Minutes Intravenous Every 8 hours 09/27/18 1133 09/30/18 1119   09/18/18 1330  ceFAZolin (ANCEF) IVPB 2g/100 mL premix     2 g 200 mL/hr over 30 Minutes Intravenous To Radiology 09/18/18 1323 09/18/18 1356   09/17/18 0900  ceFAZolin (ANCEF) IVPB 2g/100 mL premix     2 g 200 mL/hr over 30 Minutes Intravenous To Radiology 09/17/18 0849 09/18/18 0900   09/14/18 1000  cefTRIAXone (ROCEPHIN) 2 g in sodium chloride 0.9 % 100 mL IVPB  Status:  Discontinued     2 g 200 mL/hr over 30 Minutes Intravenous Every 24 hours 09/14/18 0919 09/16/18 1040   09/12/18 0000  vancomycin (VANCOCIN) 1,250 mg in sodium chloride 0.9 % 250 mL IVPB  Status:  Discontinued     1,250 mg 166.7 mL/hr over 90 Minutes Intravenous Every 24 hours 09/11/18 0044 09/11/18 1229   09/12/18 0000  vancomycin (VANCOCIN) IVPB 1000 mg/200 mL premix  Status:  Discontinued     1,000 mg 200 mL/hr over 60 Minutes Intravenous Every 24 hours 09/11/18 1229 09/13/18 1117   09/11/18 0800  piperacillin-tazobactam (ZOSYN) IVPB 3.375 g  Status:  Discontinued     3.375 g 12.5 mL/hr over  240 Minutes Intravenous Every 8 hours 09/11/18 0044 09/13/18 1117   09/11/18 0045  piperacillin-tazobactam (ZOSYN) IVPB 3.375 g     3.375 g 100 mL/hr over 30 Minutes Intravenous  Once 09/11/18 0040 09/11/18 0232   09/11/18 0045  vancomycin (VANCOCIN) 1,500 mg in sodium chloride 0.9 % 500 mL IVPB     1,500 mg 250 mL/hr over 120 Minutes Intravenous  Once 09/11/18 0040 09/11/18 0447      Procedures: 817>>peg 8/19>>trach  CONSULTS:   pulmonary/intensive care and neurology  Time spent: 25- minutes-Greater than 50% of this time was spent in counseling, explanation of diagnosis, planning of further management, and coordination of care.  MEDICATIONS: Scheduled Meds: . carvedilol  6.25 mg Per Tube BID WC  . chlorhexidine  15 mL Mouth Rinse BID  . clonazePAM  0.5 mg Per Tube TID  . enoxaparin (LOVENOX) injection  40 mg Subcutaneous Q24H  . feeding supplement (PRO-STAT SUGAR FREE 64)  30 mL Per Tube BID  . folic acid  1 mg Per Tube Daily  . free water  200 mL Per Tube Q6H  . Gerhardt's butt cream   Topical QID  . guaiFENesin  5 mL Per Tube Q6H  . mouth rinse  15 mL Mouth Rinse q12n4p  . multivitamin  15 mL Per Tube Daily  . nutrition supplement (JUVEN)  1 packet Per Tube BID BM  . pregabalin  25 mg Per Tube BID  . QUEtiapine  25 mg Per Tube QHS  . thiamine  100 mg Per Tube Daily   Continuous Infusions: . sodium chloride Stopped (10/08/18 2212)  . feeding supplement (JEVITY 1.2 CAL) 60 mL/hr at 11/03/18 1700   PRN Meds:.sodium chloride, acetaminophen, albuterol, bisacodyl, docusate, fentaNYL (SUBLIMAZE) injection, Gerhardt's butt cream, LORazepam, ondansetron **OR** ondansetron (ZOFRAN) IV, oxyCODONE, pneumococcal 23 valent vaccine   PHYSICAL EXAM: Vital signs: Vitals:   11/03/18 2302 11/04/18 0316 11/04/18 0814 11/04/18 0853  BP:   114/68 114/68  Pulse: (!) 113 (!) 117 (!) 105 (!) 103  Resp: 18 (!) 24 16   Temp:  99.4 F (37.4 C) 98.1 F (36.7 C)   TempSrc:  Axillary Oral   SpO2: 99% 97% 95% 96%  Weight:      Height:       Filed Weights   11/01/18 0420 11/02/18 0601 11/03/18 0500  Weight: 58.6 kg 58.5 kg 61.7 kg   Body mass index is 20.68 kg/m.   Gen Exam: Sleeping when I walked in (apparently given narcotic earlier per RN)-briefly wakes up-and then goes back to sleep.  Withdraws to pain. HEENT:atraumatic, normocephalic Chest: B/L clear to auscultation anteriorly CVS:S1S2 regular Abdomen:soft non  tender, non distended Extremities:no edema Neurology: Non focal Skin: no rash  I have personally reviewed following labs and imaging studies  LABORATORY DATA: CBC: Recent Labs  Lab 10/29/18 0139 11/01/18 0135  WBC 8.7 8.7  HGB 11.9* 12.0*  HCT 35.2* 36.4*  MCV 79.8* 80.9  PLT 296 298    Basic Metabolic Panel: Recent Labs  Lab 11/01/18 0135  NA 138  K 3.9  CL 104  CO2 23  GLUCOSE 118*  BUN 28*  CREATININE 0.90  CALCIUM 9.5    GFR: Estimated Creatinine Clearance: 70.5 mL/min (by C-G formula based on SCr of 0.9 mg/dL).  Liver Function Tests: Recent Labs  Lab 11/01/18 0135  AST 24  ALT 22  ALKPHOS 97  BILITOT 0.5  PROT 6.9  ALBUMIN 2.9*   No results for input(s): LIPASE, AMYLASE in  the last 168 hours. No results for input(s): AMMONIA in the last 168 hours.  Coagulation Profile: No results for input(s): INR, PROTIME in the last 168 hours.  Cardiac Enzymes: No results for input(s): CKTOTAL, CKMB, CKMBINDEX, TROPONINI in the last 168 hours.  BNP (last 3 results) No results for input(s): PROBNP in the last 8760 hours.  HbA1C: No results for input(s): HGBA1C in the last 72 hours.  CBG: Recent Labs  Lab 10/30/18 2239 10/31/18 2120 11/01/18 2054 11/02/18 2100 11/03/18 2221  GLUCAP 123* 105* 146* 130* 120*    Lipid Profile: No results for input(s): CHOL, HDL, LDLCALC, TRIG, CHOLHDL, LDLDIRECT in the last 72 hours.  Thyroid Function Tests: No results for input(s): TSH, T4TOTAL, FREET4, T3FREE, THYROIDAB in the last 72 hours.  Anemia Panel: No results for input(s): VITAMINB12, FOLATE, FERRITIN, TIBC, IRON, RETICCTPCT in the last 72 hours.  Urine analysis:    Component Value Date/Time   COLORURINE YELLOW 10/25/2018 1016   APPEARANCEUR CLEAR 10/25/2018 1016   LABSPEC 1.015 10/25/2018 1016   PHURINE 6.0 10/25/2018 1016   GLUCOSEU NEGATIVE 10/25/2018 1016   HGBUR NEGATIVE 10/25/2018 1016   BILIRUBINUR NEGATIVE 10/25/2018 1016   KETONESUR  NEGATIVE 10/25/2018 1016   PROTEINUR NEGATIVE 10/25/2018 1016   NITRITE NEGATIVE 10/25/2018 1016   LEUKOCYTESUR NEGATIVE 10/25/2018 1016    Sepsis Labs: Lactic Acid, Venous    Component Value Date/Time   LATICACIDVEN 1.7 09/14/2018 0357    MICROBIOLOGY: No results found for this or any previous visit (from the past 240 hour(s)).  RADIOLOGY STUDIES/RESULTS: Dg Chest Port 1 View  Result Date: 11/04/2018 CLINICAL DATA:  Nonresponsive, low fever yesterday, history COPD, cardiac arrest, ethanol abuse, pneumonia, drug overdose EXAM: PORTABLE CHEST 1 VIEW COMPARISON:  Portable exam 0827 hours compared to 10/26/2018 FINDINGS: Tracheostomy tube stable projecting over tracheal air column. Normal heart size, mediastinal contours, and pulmonary vascularity. Minimal atelectasis or infiltrate at RIGHT base. Remaining lungs clear. No pleural effusion or pneumothorax. Bones unremarkable. IMPRESSION: Minimal atelectasis or infiltrate at RIGHT base. Electronically Signed   By: Ulyses Southward M.D.   On: 11/04/2018 08:39   Dg Chest Port 1 View  Result Date: 10/26/2018 CLINICAL DATA:  Pneumonia, tracheostomy EXAM: PORTABLE CHEST 1 VIEW COMPARISON:  10/25/2018 FINDINGS: Unchanged AP portable examination with bandlike scarring or atelectasis of the right midlung. No new or acute appearing airspace opacity. Unchanged elevation of the left hemidiaphragm. Tracheostomy. Heart and mediastinum are unremarkable. IMPRESSION: Unchanged AP portable examination with bandlike scarring or atelectasis of the right midlung. No new or acute appearing airspace opacity. Unchanged elevation of the left hemidiaphragm. Tracheostomy. Electronically Signed   By: Lauralyn Primes M.D.   On: 10/26/2018 11:51   Dg Chest Port 1 View  Result Date: 10/25/2018 CLINICAL DATA:  Fever EXAM: PORTABLE CHEST 1 VIEW COMPARISON:  09/30/2018 FINDINGS: Lungs are well aerated and clear. Negative for pneumonia. Negative for heart failure or effusion.  Tracheostomy remains in good position. IMPRESSION: No active disease. Electronically Signed   By: Marlan Palau M.D.   On: 10/25/2018 10:06     LOS: 57 days   Jeoffrey Massed, MD  [facility]  If 7PM-7AM, please contact night-coverage  Please page via [email or web address]  Go to [email or web address] and use [place]'s universal password to access. If you do not have the password, please contact the hospital operator.  Locate the [facility] provider you are looking for under [facility] and page to a number that you can be directly reached. If you still  have difficulty reaching the provider, please page the [facility] (Director on Call) for the Hospitalists listed on amion for assistance.  11/04/2018, 10:00 AM

## 2018-11-04 NOTE — Care Management Important Message (Signed)
Important Message  Patient Details  Name: Micheal Carey MRN: [id number] Date of Birth: [birth year]   Medicare Important Message Given:  Yes     Memory Argue 11/04/2018, 4:25 PM

## 2018-11-04 NOTE — Consult Note (Signed)
[place] Nurse wound follow up Patient receiving care in [place]. Wound type: wound to left heel has healed. Continue Prevalon boots, and heel foams if preferred by nursing staff. [place] nurse will not follow at this time.  Please re-consult the [place] team if needed.  Val Riles, RN, MSN, CWOCN, CNS-BC, pager [phone]

## 2018-11-05 LAB — BASIC METABOLIC PANEL
Anion gap: 10 (ref 5–15)
BUN: 25 mg/dL — ABNORMAL HIGH (ref 8–23)
CO2: 23 mmol/L (ref 22–32)
Calcium: 9.2 mg/dL (ref 8.9–10.3)
Chloride: 104 mmol/L (ref 98–111)
Creatinine, Ser: 0.82 mg/dL (ref 0.61–1.24)
GFR calc Af Amer: >60 mL/min (ref >60)
GFR calc non Af Amer: >60 mL/min (ref >60)
Glucose, Bld: 115 mg/dL — ABNORMAL HIGH (ref 70–99)
Potassium: 4.2 mmol/L (ref 3.5–5.1)
Sodium: 137 mmol/L (ref 135–145)

## 2018-11-05 LAB — CBC
HCT: 37.4 % — ABNORMAL LOW (ref 39.0–52.0)
Hemoglobin: 12.6 g/dL — ABNORMAL LOW (ref 13.0–17.0)
MCH: 26.6 pg (ref 26.0–34.0)
MCHC: 33.7 g/dL (ref 30.0–36.0)
MCV: 79.1 fL — ABNORMAL LOW (ref 80.0–100.0)
Platelets: 294 10*3/uL (ref 150–400)
RBC: 4.73 MIL/uL (ref 4.22–5.81)
RDW: 14.2 % (ref 11.5–15.5)
WBC: 9.1 10*3/uL (ref 4.0–10.5)
nRBC: 0 % (ref 0.0–0.2)

## 2018-11-05 LAB — GLUCOSE, CAPILLARY: Glucose-Capillary: 111 mg/dL — ABNORMAL HIGH (ref 70–99)

## 2018-11-05 MED ORDER — SODIUM CHLORIDE 0.9 % IV SOLN
2.0000 g | Freq: Three times a day (TID) | INTRAVENOUS | Status: DC
  Administered 2018-11-05 – 2018-11-09 (×13): 2 g via INTRAVENOUS
  Filled 2018-11-05 (×15): qty 2

## 2018-11-05 MED ORDER — METRONIDAZOLE IN NACL 5-0.79 MG/ML-% IV SOLN
500.0000 mg | Freq: Three times a day (TID) | INTRAVENOUS | Status: DC
  Administered 2018-11-05 – 2018-11-09 (×13): 500 mg via INTRAVENOUS
  Filled 2018-11-05 (×13): qty 100

## 2018-11-05 NOTE — TOC Progression Note (Signed)
Transition of Care [facility]) - Progression Note    Patient Details  Name: Micheal Carey MRN: [id number] Date of Birth: February 10, 1950  Transition of Care [facility]) CM/SW [place], [place] Phone Number: [phone] 11/05/2018, 9:28 AM  Clinical Narrative:     CSW has left message with Audelia Acton with [facility] to follow up on any feedback from referrals that were sent out all over the state of [place]. Pending his response.   Expected Discharge Plan: Long Term Nursing Home Barriers to Discharge: No SNF bed  Expected Discharge Plan and Services Expected Discharge Plan: [place]     Post Acute Care Choice: Long Term Acute Care (LTAC) Living arrangements for the past 2 months: Single Family Home                                       Social Determinants of Health (SDOH) Interventions    Readmission Risk Interventions No flowsheet data found.

## 2018-11-05 NOTE — Progress Notes (Signed)
Suctioned patients trach during trach check, trach secure and in tach.. suctioned patients mouth as well.

## 2018-11-05 NOTE — Progress Notes (Signed)
Pharmacy Antibiotic Note  Micheal Carey is a 68 y.o. male admitted on 09/08/2018 with pneumonia.  Pharmacy has been consulted for cefepime dosing. Plans ar for 7 days of therapy. He is noted with recent Acinetobacter, Citrobacter in trach cultures (sensitive to cefepime) -WBC= 9.1, tmax= 100.5, CrCl ~ 80  Plan: -Cefepime 2gm IV q8h -Will follow renal function, cultures and clinical progress   Height: 5\' 8"  (172.7 cm) Weight: 136 lb 3.9 oz (61.8 kg) IBW/kg (Calculated) : 68.4  Temp (24hrs), Avg:99.4 F (37.4 C), Min:98.5 F (36.9 C), Max:100.5 F (38.1 C)  Recent Labs  Lab 11/01/18 0135 11/05/18 0121 11/05/18 0954  WBC 8.7 9.1  --   CREATININE 0.90  --  0.82    Estimated Creatinine Clearance: 77.5 mL/min (by C-G formula based on SCr of 0.82 mg/dL).    No Known Allergies  Antimicrobials this admission: Vanc 8/12 >>8/14 Zosyn 8/12 >>8/14 CTX 8/15 >>8/17 Cefepime 8/28>>8/31 Merrem 8/31 >> 9/7  10/6 cefepime>>  Dose adjustments this admission:  Microbiology results: 8/27: TA - Acinetobacter, Citrobacter (both suc to cefepime) 8/28: UCx - Enterobacter species, Citrobacter(both S Primaxin)  Thank you for allowing pharmacy to be a part of this patient's care.  Hildred Laser, PharmD Clinical Pharmacist **Pharmacist phone directory can now be found on [email or web address] ([email or web address]).  Listed under [place].

## 2018-11-05 NOTE — Progress Notes (Signed)
PROGRESS NOTE        PATIENT DETAILS Name: Micheal Carey Age: 68 y.o. Sex: male Date of Birth: 31-Dec-1950 Admit Date: 09/08/2018 Admitting Physician Rush Farmer, MD NAT:[id number], Christean Grief, MD  Brief Narrative: Patient is a 68 y.o. male with history of COPD, EtOH dependence-presented with PEA arrest with ROSC after 10 minutes.  Unfortunately patient remained unresponsive after ROSC-was intubated and admitted to the ICU.  Unfortunately-Hospital course has been prolonged-due to severe anoxic encephalopathy-patient is now s/p PEG and trach.  See below for further details.  Subjective: Now having low-grade fever persistently.  Had vomited the night before-concern for some amount of pneumonia/aspiration.  Assessment/Plan: PEA arrest with anoxic encephalopathy and persistent vegetative state: Remains stable-continue Seroquel, Klonopin and Lyrica.  Remains on tube feeds.  Palliative care following-full scope of treatment recommended at this point per family's wishes.  Awaiting SNF.  Acute hypoxic respiratory failure: intubated on admit-now s/p trach  Acinetobacter/Citrobacter PNA/UTI: Had completed a course of antimicrobial therapy-however now starting to have low-grade fever again-patient had vomited the night before-chest x-ray done yesterday-although no obvious infiltrates-this question for some amount of atelectasis/infiltrate in the right lower lung field-given that patient has now started having fever-I suspect it is prudent to start him on antimicrobial therapy.  Begin cefepime/Flagyl.  We will plan on a 5-7-day course.  Continue aspiration precautions.  HTN: controlled-continue Coreg  COPD: stable  Stage II pressure injury, left heel, not POA   Nutrition Problem:  Nutrition Problem: Moderate Malnutrition Etiology: social / environmental circumstances(Hx substance abuse) Signs/Symptoms: moderate muscle depletion, moderate fat depletion Interventions: Tube  feeding, Prostat, Juven, MVI    Diet: Diet Order    None       DVT Prophylaxis: Prophylactic Lovenox   Code Status: Full code   Family Communication: None at bedside-spoke with son Micheal Carey on 10/7.  Disposition Plan: Remain inpatient-awaiting SNF bed-SW following  Antimicrobial agents: Anti-infectives (From admission, onward)   Start     Dose/Rate Route Frequency Ordered Stop   11/05/18 1030  metroNIDAZOLE (FLAGYL) IVPB 500 mg     500 mg 100 mL/hr over 60 Minutes Intravenous Every 8 hours 11/05/18 1018     09/30/18 1400  meropenem (MERREM) 1 g in sodium chloride 0.9 % 100 mL IVPB     1 g 200 mL/hr over 30 Minutes Intravenous Every 8 hours 09/30/18 1119 10/07/18 0539   09/27/18 1200  ceFEPIme (MAXIPIME) 2 g in sodium chloride 0.9 % 100 mL IVPB  Status:  Discontinued     2 g 200 mL/hr over 30 Minutes Intravenous Every 8 hours 09/27/18 1133 09/30/18 1119   09/18/18 1330  ceFAZolin (ANCEF) IVPB 2g/100 mL premix     2 g 200 mL/hr over 30 Minutes Intravenous To Radiology 09/18/18 1323 09/18/18 1356   09/17/18 0900  ceFAZolin (ANCEF) IVPB 2g/100 mL premix     2 g 200 mL/hr over 30 Minutes Intravenous To Radiology 09/17/18 0849 09/18/18 0900   09/14/18 1000  cefTRIAXone (ROCEPHIN) 2 g in sodium chloride 0.9 % 100 mL IVPB  Status:  Discontinued     2 g 200 mL/hr over 30 Minutes Intravenous Every 24 hours 09/14/18 0919 09/16/18 1040   09/12/18 0000  vancomycin (VANCOCIN) 1,250 mg in sodium chloride 0.9 % 250 mL IVPB  Status:  Discontinued     1,250 mg 166.7 mL/hr over 90  Minutes Intravenous Every 24 hours 09/11/18 0044 09/11/18 1229   09/12/18 0000  vancomycin (VANCOCIN) IVPB 1000 mg/200 mL premix  Status:  Discontinued     1,000 mg 200 mL/hr over 60 Minutes Intravenous Every 24 hours 09/11/18 1229 09/13/18 1117   09/11/18 0800  piperacillin-tazobactam (ZOSYN) IVPB 3.375 g  Status:  Discontinued     3.375 g 12.5 mL/hr over 240 Minutes Intravenous Every 8 hours 09/11/18 0044  09/13/18 1117   09/11/18 0045  piperacillin-tazobactam (ZOSYN) IVPB 3.375 g     3.375 g 100 mL/hr over 30 Minutes Intravenous  Once 09/11/18 0040 09/11/18 0232   09/11/18 0045  vancomycin (VANCOCIN) 1,500 mg in sodium chloride 0.9 % 500 mL IVPB     1,500 mg 250 mL/hr over 120 Minutes Intravenous  Once 09/11/18 0040 09/11/18 0447      Procedures: 817>>peg 8/19>>trach  CONSULTS:  pulmonary/intensive care and neurology  Time spent: 25- minutes-Greater than 50% of this time was spent in counseling, explanation of diagnosis, planning of further management, and coordination of care.  MEDICATIONS: Scheduled Meds: . carvedilol  6.25 mg Per Tube BID WC  . chlorhexidine  15 mL Mouth Rinse BID  . clonazePAM  0.5 mg Per Tube TID  . enoxaparin (LOVENOX) injection  40 mg Subcutaneous Q24H  . feeding supplement (PRO-STAT SUGAR FREE 64)  30 mL Per Tube BID  . folic acid  1 mg Per Tube Daily  . free water  200 mL Per Tube Q6H  . Gerhardt's butt cream   Topical QID  . guaiFENesin  5 mL Per Tube Q6H  . mouth rinse  15 mL Mouth Rinse q12n4p  . multivitamin  15 mL Per Tube Daily  . nutrition supplement (JUVEN)  1 packet Per Tube BID BM  . pregabalin  25 mg Per Tube BID  . QUEtiapine  25 mg Per Tube QHS  . thiamine  100 mg Per Tube Daily   Continuous Infusions: . sodium chloride Stopped (10/08/18 2212)  . feeding supplement (JEVITY 1.2 CAL) 60 mL/hr at 11/05/18 0800  . metronidazole     PRN Meds:.sodium chloride, acetaminophen, albuterol, bisacodyl, docusate, fentaNYL (SUBLIMAZE) injection, Gerhardt's butt cream, LORazepam, ondansetron **OR** ondansetron (ZOFRAN) IV, oxyCODONE, pneumococcal 23 valent vaccine   PHYSICAL EXAM: Vital signs: Vitals:   11/05/18 0352 11/05/18 0617 11/05/18 0705 11/05/18 0804  BP: (!) 139/93   134/71  Pulse:    (!) 106  Resp: 14   20  Temp: (!) 100.5 F (38.1 C)  98.5 F (36.9 C) 100 F (37.8 C)  TempSrc: Axillary  Axillary Axillary  SpO2: 95%   97%   Weight:  61.8 kg    Height:       Filed Weights   11/02/18 0601 11/03/18 0500 11/05/18 0617  Weight: 58.5 kg 61.7 kg 61.8 kg   Body mass index is 20.72 kg/m.   Gen Exam: Treximet movement-but really does not have a consistent purposeful movement. HEENT:atraumatic, normocephalic Chest: B/L clear to auscultation anteriorly-some transmitted airway sounds today CVS:S1S2 regular Abdomen:soft non tender, non distended Extremities:no edema Skin: no rash I have personally reviewed following labs and imaging studies  LABORATORY DATA: CBC: Recent Labs  Lab 11/01/18 0135 11/05/18 0121  WBC 8.7 9.1  HGB 12.0* 12.6*  HCT 36.4* 37.4*  MCV 80.9 79.1*  PLT 298 294    Basic Metabolic Panel: Recent Labs  Lab 11/01/18 0135  NA 138  K 3.9  CL 104  CO2 23  GLUCOSE 118*  BUN  28*  CREATININE 0.90  CALCIUM 9.5    GFR: Estimated Creatinine Clearance: 70.6 mL/min (by C-G formula based on SCr of 0.9 mg/dL).  Liver Function Tests: Recent Labs  Lab 11/01/18 0135  AST 24  ALT 22  ALKPHOS 97  BILITOT 0.5  PROT 6.9  ALBUMIN 2.9*   No results for input(s): LIPASE, AMYLASE in the last 168 hours. No results for input(s): AMMONIA in the last 168 hours.  Coagulation Profile: No results for input(s): INR, PROTIME in the last 168 hours.  Cardiac Enzymes: No results for input(s): CKTOTAL, CKMB, CKMBINDEX, TROPONINI in the last 168 hours.  BNP (last 3 results) No results for input(s): PROBNP in the last 8760 hours.  HbA1C: No results for input(s): HGBA1C in the last 72 hours.  CBG: Recent Labs  Lab 10/31/18 2120 11/01/18 2054 11/02/18 2100 11/03/18 2221 11/04/18 2156  GLUCAP 105* 146* 130* 120* 142*    Lipid Profile: No results for input(s): CHOL, HDL, LDLCALC, TRIG, CHOLHDL, LDLDIRECT in the last 72 hours.  Thyroid Function Tests: No results for input(s): TSH, T4TOTAL, FREET4, T3FREE, THYROIDAB in the last 72 hours.  Anemia Panel: No results for input(s):  VITAMINB12, FOLATE, FERRITIN, TIBC, IRON, RETICCTPCT in the last 72 hours.  Urine analysis:    Component Value Date/Time   COLORURINE YELLOW 10/25/2018 1016   APPEARANCEUR CLEAR 10/25/2018 1016   LABSPEC 1.015 10/25/2018 1016   PHURINE 6.0 10/25/2018 1016   GLUCOSEU NEGATIVE 10/25/2018 1016   HGBUR NEGATIVE 10/25/2018 1016   BILIRUBINUR NEGATIVE 10/25/2018 1016   KETONESUR NEGATIVE 10/25/2018 1016   PROTEINUR NEGATIVE 10/25/2018 1016   NITRITE NEGATIVE 10/25/2018 1016   LEUKOCYTESUR NEGATIVE 10/25/2018 1016    Sepsis Labs: Lactic Acid, Venous    Component Value Date/Time   LATICACIDVEN 1.7 09/14/2018 0357    MICROBIOLOGY: No results found for this or any previous visit (from the past 240 hour(s)).  RADIOLOGY STUDIES/RESULTS: Dg Chest Port 1 View  Result Date: 11/04/2018 CLINICAL DATA:  Nonresponsive, low fever yesterday, history COPD, cardiac arrest, ethanol abuse, pneumonia, drug overdose EXAM: PORTABLE CHEST 1 VIEW COMPARISON:  Portable exam 0827 hours compared to 10/26/2018 FINDINGS: Tracheostomy tube stable projecting over tracheal air column. Normal heart size, mediastinal contours, and pulmonary vascularity. Minimal atelectasis or infiltrate at RIGHT base. Remaining lungs clear. No pleural effusion or pneumothorax. Bones unremarkable. IMPRESSION: Minimal atelectasis or infiltrate at RIGHT base. Electronically Signed   By: Ulyses SouthwardMark  Boles M.D.   On: 11/04/2018 08:39   Dg Chest Port 1 View  Result Date: 10/26/2018 CLINICAL DATA:  Pneumonia, tracheostomy EXAM: PORTABLE CHEST 1 VIEW COMPARISON:  10/25/2018 FINDINGS: Unchanged AP portable examination with bandlike scarring or atelectasis of the right midlung. No new or acute appearing airspace opacity. Unchanged elevation of the left hemidiaphragm. Tracheostomy. Heart and mediastinum are unremarkable. IMPRESSION: Unchanged AP portable examination with bandlike scarring or atelectasis of the right midlung. No new or acute appearing  airspace opacity. Unchanged elevation of the left hemidiaphragm. Tracheostomy. Electronically Signed   By: Lauralyn PrimesAlex  Bibbey M.D.   On: 10/26/2018 11:51   Dg Chest Port 1 View  Result Date: 10/25/2018 CLINICAL DATA:  Fever EXAM: PORTABLE CHEST 1 VIEW COMPARISON:  09/30/2018 FINDINGS: Lungs are well aerated and clear. Negative for pneumonia. Negative for heart failure or effusion. Tracheostomy remains in good position. IMPRESSION: No active disease. Electronically Signed   By: Marlan Palauharles  Clark M.D.   On: 10/25/2018 10:06     LOS: 58 days   Jeoffrey MassedShanker Makesha Belitz, MD  [facility]  If 7PM-7AM, please contact night-coverage  Please page via [email or web address]  Go to [email or web address] and use [facility]'s universal password to access. If you do not have the password, please contact the hospital operator.  Locate the [facility] provider you are looking for under [facility] and page to a number that you can be directly reached. If you still have difficulty reaching the provider, please page the [facility] (Director on Call) for the Hospitalists listed on amion for assistance.  11/05/2018, 10:20 AM

## 2018-11-06 LAB — GLUCOSE, CAPILLARY: Glucose-Capillary: 106 mg/dL — ABNORMAL HIGH (ref 70–99)

## 2018-11-06 NOTE — Progress Notes (Signed)
Nutrition Follow-up  DOCUMENTATION CODES:   Non-severe (moderate) malnutrition in context of social or environmental circumstances  INTERVENTION:   Continue tube feeds via PEG: - Jevity 1.2 @ 60 ml/hr - Pro-stat 30 ml BID  Tube feeding regimen provides1928kcal, 110grams of protein, and 1110m of H2O.   - Continue free water flushes of200 ml QIDfor an additional 800 ml free water daily  - 1 packet Juven BIDper tube, each packet provides 95 calories, 2.5 grams of protein, and 9.8 grams of carbohydrate; also containsL-arginine and L-glutamine, vitamin C, vitamin E, vitamin B-12, zinc, calcium, and calcium Beta-hydroxy-Beta-methylbutyrate to support wound healing  NUTRITION DIAGNOSIS:   Moderate Malnutrition related to social / environmental circumstances (history of substance abuse) as evidenced by moderate muscle depletion, moderate fat depletion.  Ongoing, being addressed via TF  GOAL:   Patient will meet greater than or equal to 90% of their needs  Met via TF  MONITOR:   Labs, Weight trends, TF tolerance, Skin, I & O's  REASON FOR ASSESSMENT:   Consult Enteral/tube feeding initiation and management  ASSESSMENT:   68yo male admitted with PEA arrest after being found down in a parking lot. Urine screen positive for opiates, ETOH positive. PMH includes alcohol and opiate abuse, CAP.  8/17 - s/p trach, Cortrak placed 8/19 - s/p PEG placement 8/20 - TF held due to vomiting 8/21 - TF restarted at trickle 9/18 - episode of emesis 10/4 - episode of emesis  Pt remains medically stable for d/c.  Weight remains fairly stable.  Checked in with pt at bedside. Pt sleeping soundly at time of RD visit. TF infusing via PEG as ordered.  Current TF: Jevity 1.2 @ 60 ml/hr, Pro-stat 30 ml BID, free water20105mq 6 hours  Medications reviewed and include: folic acid, liquid MVI, Juven BID, thiamine, IV abx  Labs reviewed. CBG's: 111  UOP: 900 ml x 24  hours I/O's: +17.9 L since admit  Diet Order:   Diet Order    None      EDUCATION NEEDS:   No education needs have been identified at this time  Skin:  Skin Assessment: Skin Integrity Issues: Skin Integrity Issues: DTI: right leg Stage II: left heel  Last BM:  11/05/18  Height:   Ht Readings from Last 1 Encounters:  09/08/18 5' 8" (1.727 m)    Weight:   Wt Readings from Last 1 Encounters:  11/06/18 61.5 kg    Ideal Body Weight:  70 kg  BMI:  Body mass index is 20.62 kg/m.  Estimated Nutritional Needs:   Kcal:  1750-1950 kcal  Protein:  105-120 grams  Fluid:  >/= 1.7 L/day    KaGaynell FaceMS, RD, LDN Inpatient Clinical Dietitian Pager: [phone]/After Hours: [phone]

## 2018-11-06 NOTE — Progress Notes (Signed)
PROGRESS NOTE    Micheal Carey  ZOX:[id number] DOB: 1950/12/06 DOA: 09/08/2018 PCP: Fleet ContrasAvbuere, Edwin, MD     Brief Narrative:  Micheal Carey is a 68 y.o. male with history of COPD, EtOH dependence who presented with PEA arrest with ROSC after 10 minutes.  Unfortunately patient remained unresponsive after ROSC. He was intubated and admitted to the ICU. Hospital course has been prolonged due to severe anoxic encephalopathy. Patient is now s/p PEG and trach.  There was also concern for aspiration pneumonia, patient was started on antibiotics.  New events last 24 hours / Subjective: Patient remains nonverbal.  In no acute respiratory distress.  Afebrile overnight.  Assessment & Plan:   Principal Problem:   Cardiac arrest [facility](HCC) Active Problems:   Alcohol abuse   COPD with acute exacerbation (HCC)   Pneumonia   COPD (chronic obstructive pulmonary disease) (HCC)   Sepsis (HCC)   Drug overdose   Malnutrition of moderate degree   Pressure injury of skin   Tracheostomy status (HCC)   Acute lower UTI   Anoxic encephalopathy (HCC)   Alcohol overdose   Dysphagia   Acute respiratory failure with hypoxia (HCC)   Palliative care by specialist   Goals of care, counseling/discussion   Advanced care planning/counseling discussion   PEA arrest with anoxic encephalopathy and persistent vegetative state: Remains stable. Continue Seroquel, Klonopin and Lyrica.  Remains on tube feeds.  Palliative care consulted, full scope of treatment recommended at this point per family's wishes.  Awaiting SNF.  Acute hypoxic respiratory failure: Intubated on admit, now s/p trach  Acinetobacter/Citrobacter PNA/UTI: Had completed a course of antimicrobial therapy. However now starting to have low-grade fever again, patient had vomited the night before. Question for some amount of atelectasis/infiltrate in the right lower lung field. Started on cefepime/Flagyl for concern for aspiration pneumonia  HTN: continue  Coreg  COPD: stable  Stage II pressure injury, left heel, not POA: Local wound care   DVT prophylaxis: SCD Code Status: Full Family Communication: None Disposition Plan: Pending SNF placement    Antimicrobials:  Anti-infectives (From admission, onward)   Start     Dose/Rate Route Frequency Ordered Stop   11/05/18 1200  ceFEPIme (MAXIPIME) 2 g in sodium chloride 0.9 % 100 mL IVPB     2 g 200 mL/hr over 30 Minutes Intravenous Every 8 hours 11/05/18 1108 11/12/18 1159   11/05/18 1100  metroNIDAZOLE (FLAGYL) IVPB 500 mg     500 mg 100 mL/hr over 60 Minutes Intravenous Every 8 hours 11/05/18 1018     09/30/18 1400  meropenem (MERREM) 1 g in sodium chloride 0.9 % 100 mL IVPB     1 g 200 mL/hr over 30 Minutes Intravenous Every 8 hours 09/30/18 1119 10/07/18 0539   09/27/18 1200  ceFEPIme (MAXIPIME) 2 g in sodium chloride 0.9 % 100 mL IVPB  Status:  Discontinued     2 g 200 mL/hr over 30 Minutes Intravenous Every 8 hours 09/27/18 1133 09/30/18 1119   09/18/18 1330  ceFAZolin (ANCEF) IVPB 2g/100 mL premix     2 g 200 mL/hr over 30 Minutes Intravenous To Radiology 09/18/18 1323 09/18/18 1356   09/17/18 0900  ceFAZolin (ANCEF) IVPB 2g/100 mL premix     2 g 200 mL/hr over 30 Minutes Intravenous To Radiology 09/17/18 0849 09/18/18 0900   09/14/18 1000  cefTRIAXone (ROCEPHIN) 2 g in sodium chloride 0.9 % 100 mL IVPB  Status:  Discontinued     2 g 200 mL/hr over 30  Minutes Intravenous Every 24 hours 09/14/18 0919 09/16/18 1040   09/12/18 0000  vancomycin (VANCOCIN) 1,250 mg in sodium chloride 0.9 % 250 mL IVPB  Status:  Discontinued     1,250 mg 166.7 mL/hr over 90 Minutes Intravenous Every 24 hours 09/11/18 0044 09/11/18 1229   09/12/18 0000  vancomycin (VANCOCIN) IVPB 1000 mg/200 mL premix  Status:  Discontinued     1,000 mg 200 mL/hr over 60 Minutes Intravenous Every 24 hours 09/11/18 1229 09/13/18 1117   09/11/18 0800  piperacillin-tazobactam (ZOSYN) IVPB 3.375 g  Status:   Discontinued     3.375 g 12.5 mL/hr over 240 Minutes Intravenous Every 8 hours 09/11/18 0044 09/13/18 1117   09/11/18 0045  piperacillin-tazobactam (ZOSYN) IVPB 3.375 g     3.375 g 100 mL/hr over 30 Minutes Intravenous  Once 09/11/18 0040 09/11/18 0232   09/11/18 0045  vancomycin (VANCOCIN) 1,500 mg in sodium chloride 0.9 % 500 mL IVPB     1,500 mg 250 mL/hr over 120 Minutes Intravenous  Once 09/11/18 0040 09/11/18 0447        Objective: Vitals:   11/06/18 0820 11/06/18 0831 11/06/18 1100 11/06/18 1151  BP:  (!) 143/80 (!) 100/54 (!) 100/54  Pulse: (!) 105 (!) 102 92 100  Resp: 18 14 (!) 23 18  Temp:  98.6 F (37 C) 98.2 F (36.8 C)   TempSrc:  Axillary Axillary   SpO2: 99% 99% 97%   Weight:      Height:        Intake/Output Summary (Last 24 hours) at 11/06/2018 1159 Last data filed at 11/06/2018 0800 Gross per 24 hour  Intake 1640 ml  Output 901 ml  Net 739 ml   Filed Weights   11/03/18 0500 11/05/18 0617 11/06/18 0540  Weight: 61.7 kg 61.8 kg 61.5 kg    Examination:  General exam: Appears calm and comfortable, alert but nonverbal Respiratory system: Trach collar in place Cardiovascular system: S1 & S2 heard, tachycardic, regular rhythm Gastrointestinal system: Abdomen is nondistended, soft and nontender. Normal bowel sounds heard.  Central nervous system: Alert Extremities: Symmetric   Data Reviewed: I have personally reviewed following labs and imaging studies  CBC: Recent Labs  Lab 11/01/18 0135 11/05/18 0121  WBC 8.7 9.1  HGB 12.0* 12.6*  HCT 36.4* 37.4*  MCV 80.9 79.1*  PLT 298 294   Basic Metabolic Panel: Recent Labs  Lab 11/01/18 0135 11/05/18 0954  NA 138 137  K 3.9 4.2  CL 104 104  CO2 23 23  GLUCOSE 118* 115*  BUN 28* 25*  CREATININE 0.90 0.82  CALCIUM 9.5 9.2   GFR: Estimated Creatinine Clearance: 77.1 mL/min (by C-G formula based on SCr of 0.82 mg/dL). Liver Function Tests: Recent Labs  Lab 11/01/18 0135  AST 24  ALT 22   ALKPHOS 97  BILITOT 0.5  PROT 6.9  ALBUMIN 2.9*   No results for input(s): LIPASE, AMYLASE in the last 168 hours. No results for input(s): AMMONIA in the last 168 hours. Coagulation Profile: No results for input(s): INR, PROTIME in the last 168 hours. Cardiac Enzymes: No results for input(s): CKTOTAL, CKMB, CKMBINDEX, TROPONINI in the last 168 hours. BNP (last 3 results) No results for input(s): PROBNP in the last 8760 hours. HbA1C: No results for input(s): HGBA1C in the last 72 hours. CBG: Recent Labs  Lab 11/01/18 2054 11/02/18 2100 11/03/18 2221 11/04/18 2156 11/05/18 2125  GLUCAP 146* 130* 120* 142* 111*   Lipid Profile: No results for input(s):  CHOL, HDL, LDLCALC, TRIG, CHOLHDL, LDLDIRECT in the last 72 hours. Thyroid Function Tests: No results for input(s): TSH, T4TOTAL, FREET4, T3FREE, THYROIDAB in the last 72 hours. Anemia Panel: No results for input(s): VITAMINB12, FOLATE, FERRITIN, TIBC, IRON, RETICCTPCT in the last 72 hours. Sepsis Labs: No results for input(s): PROCALCITON, LATICACIDVEN in the last 168 hours.  No results found for this or any previous visit (from the past 240 hour(s)).    Radiology Studies: No results found.    Scheduled Meds: . carvedilol  6.25 mg Per Tube BID WC  . chlorhexidine  15 mL Mouth Rinse BID  . clonazePAM  0.5 mg Per Tube TID  . enoxaparin (LOVENOX) injection  40 mg Subcutaneous Q24H  . feeding supplement (PRO-STAT SUGAR FREE 64)  30 mL Per Tube BID  . folic acid  1 mg Per Tube Daily  . free water  200 mL Per Tube Q6H  . Gerhardt's butt cream   Topical QID  . guaiFENesin  5 mL Per Tube Q6H  . mouth rinse  15 mL Mouth Rinse q12n4p  . multivitamin  15 mL Per Tube Daily  . nutrition supplement (JUVEN)  1 packet Per Tube BID BM  . pregabalin  25 mg Per Tube BID  . QUEtiapine  25 mg Per Tube QHS  . thiamine  100 mg Per Tube Daily   Continuous Infusions: . sodium chloride Stopped (10/08/18 2212)  . ceFEPime (MAXIPIME) IV  Stopped (11/06/18 0539)  . feeding supplement (JEVITY 1.2 CAL) 60 mL/hr at 11/06/18 0800  . metronidazole 500 mg (11/06/18 0540)     LOS: 59 days      Time spent: 30 minutes   Dessa Phi, DO [facility] 11/06/2018, 11:59 AM   Available via Epic secure chat 7am-7pm After these hours, please refer to coverage provider listed on [email or web address]

## 2018-11-06 NOTE — TOC Progression Note (Addendum)
Transition of Care [facility]) - Progression Note    Patient Details  Name: Micheal Carey MRN: [id number] Date of Birth: Feb 03, 1950  Transition of [facility] CM/SW [place], [place] Phone Number: [phone] 11/06/2018, 3:15 PM  Clinical Narrative:     CSW followed up with Audelia Acton with [facility] he reports patient can get on the wait list and gave CSW number of [phone] to ask for Lowella Fairy. CSW has lvm with her.   CSW followign up with [facility] and Marianna Fuss at [facility] to reassess referral. Referrals being re sent to past facilities that have denied, CSW asking to [facility] referral and/or patient to be put on wait lists for long term bed.   Expected Discharge Plan: Long Term Nursing Home Barriers to Discharge: No SNF bed  Expected Discharge Plan and Services Expected Discharge Plan: [place]     Post Acute Care Choice: Long Term Acute Care (LTAC) Living arrangements for the past 2 months: Single Family Home                                       Social Determinants of Health (SDOH) Interventions    Readmission Risk Interventions No flowsheet data found.

## 2018-11-07 LAB — GLUCOSE, CAPILLARY: Glucose-Capillary: 117 mg/dL — ABNORMAL HIGH (ref 70–99)

## 2018-11-07 MED ORDER — CARVEDILOL 12.5 MG PO TABS
12.5000 mg | ORAL_TABLET | Freq: Two times a day (BID) | ORAL | Status: DC
  Administered 2018-11-07 – 2018-11-09 (×4): 12.5 mg
  Filled 2018-11-07 (×4): qty 1

## 2018-11-07 NOTE — Evaluation (Addendum)
Physical Therapy Evaluation Patient Details Name: Micheal Carey MRN: [id number] DOB: 1950/12/01 Today's Date: 11/07/2018   History of Present Illness  Pt is a 68 y.o. man admitted 09/08/18 after found down with PEA arrest with ROSC after 10 minutes but pt remained unresponsive, +ETOH, opiates; pt intubated. Hospital course prolonged due to severe anoxic encephalopathy. S/p PEG and trach. Also concern for aspiration PNA. PMH includes COPD, ETOH abuse, opioid abuse.    Clinical Impression  Pt presents with generalized weakness, significant muscle contractures and rigidity in all extremities, and impaired cognition. Today, pt following some simple commands, attending to therapists and initiating head movments; pt intermittently nodded yes/no in response, made a few attempts to mouth and verbalize words. Pt currently requires maxA+2 to totalA for mobility and ADL tasks. Tolerated sitting EOB and passive movement of extremities well. Pt would benefit from continued acute PT services to maximize functional mobility and independence prior to d/c to SNF.     Follow Up Recommendations LTACH;SNF;Supervision/Assistance - 24 hour    Equipment Recommendations  (TBD next venue)    Recommendations for Other Services       Precautions / Restrictions Precautions Precautions: Fall;Other (comment) Precaution Comments: Trach/vent, bowel inconctinence, multiple contractures Restrictions Weight Bearing Restrictions: No      Mobility  Bed Mobility Overal bed mobility: Needs Assistance Bed Mobility: Rolling;Supine to Sit;Sit to Supine Rolling: Total assist;+2 for physical assistance   Supine to sit: Total assist;+2 for physical assistance Sit to supine: Total assist;+2 for physical assistance   General bed mobility comments: Pt able to initiate roll when cued to turn head, requiring totalA for bed mobility  Transfers                    Ambulation/Gait                Stairs             Wheelchair Mobility    Modified Rankin (Stroke Patients Only)       Balance Overall balance assessment: Needs assistance Sitting-balance support: Feet supported;Bilateral upper extremity supported Sitting balance-Leahy Scale: Poor Sitting balance - Comments: Reliant on assist to maintain seated balance                                     Pertinent Vitals/Pain Pain Assessment: Faces Faces Pain Scale: Hurts little more Pain Location: All extremities, neck and especially hands with repositioning/stretching Pain Descriptors / Indicators: Grimacing Pain Intervention(s): Monitored during session;Repositioned    Home Living Family/patient expects to be discharged to:: Skilled nursing facility                      Prior Function           Comments: pt was found down in a parking lot     Hand Dominance        Extremity/Trunk Assessment   Upper Extremity Assessment Upper Extremity Assessment: Defer to OT evaluation    Lower Extremity Assessment Lower Extremity Assessment: RLE deficits/detail;LLE deficits/detail RLE Deficits / Details: Bilateral flexion contractures, increased tone/rigidity throughout, active movement in BLEs when guarding due to pain, not moving to command       Communication   Communication: Tracheostomy  Cognition Arousal/Alertness: Awake/alert Behavior During Therapy: Flat affect Overall Cognitive Status: Difficult to assess  General Comments: Open eyes and attempts to attend to person speaking to him, following some simple commands, grimacing to pain, at times attempting to mouth words and verbalize words      General Comments      Exercises Other Exercises Other Exercises: Passive motion to BUEs/BLEs   Assessment/Plan    PT Assessment Patient needs continued PT services  PT Problem List Decreased strength;Decreased range of motion;Decreased activity  tolerance;Decreased balance;Decreased mobility;Decreased coordination;Decreased cognition;Decreased knowledge of use of DME;Decreased skin integrity       PT Treatment Interventions DME instruction;Functional mobility training;Therapeutic activities;Therapeutic exercise;Balance training;Neuromuscular re-education;Cognitive remediation    PT Goals (Current goals can be found in the Care Plan section)  Acute Rehab PT Goals Patient Stated Goal: unable to state PT Goal Formulation: Patient unable to participate in goal setting Time For Goal Achievement: 11/21/18 Potential to Achieve Goals: Fair    Frequency Min 1X/week   Barriers to discharge        Co-evaluation PT/OT/SLP Co-Evaluation/Treatment: Yes Reason for Co-Treatment: Complexity of the patient's impairments (multi-system involvement);Necessary to address cognition/behavior during functional activity;For patient/therapist safety;To address functional/ADL transfers PT goals addressed during session: Mobility/safety with mobility;Balance         AM-PAC PT "6 Clicks" Mobility  Outcome Measure Help needed turning from your back to your side while in a flat bed without using bedrails?: Total Help needed moving from lying on your back to sitting on the side of a flat bed without using bedrails?: Total Help needed moving to and from a bed to a chair (including a wheelchair)?: Total Help needed standing up from a chair using your arms (e.g., wheelchair or bedside chair)?: Total Help needed to walk in hospital room?: Total Help needed climbing 3-5 steps with a railing? : Total 6 Click Score: 6    End of Session   Activity Tolerance: Patient tolerated treatment well Patient left: in bed;with call bell/phone within reach;with bed alarm set Nurse Communication: Mobility status PT Visit Diagnosis: Other abnormalities of gait and mobility (R26.89)    Time: 5284-1324 PT Time Calculation (min) (ACUTE ONLY): 42 min   Charges:   PT  Evaluation $PT Eval Moderate Complexity: 1 Mod PT Treatments $Therapeutic Activity: 8-22 mins   Mabeline Caras, PT, DPT Acute Rehabilitation Services  Pager [phone] Office [place] 11/07/2018, 1:09 PM

## 2018-11-07 NOTE — TOC Progression Note (Addendum)
Transition of Care [facility]) - Progression Note    Patient Details  Name: Micheal Carey MRN: [id number] Date of Birth: Jan 27, 1951  Transition of Care [facility]) CM/SW [place], [place] Phone Number: [phone] 11/07/2018, 9:40 AM  Clinical Narrative:     [facility] in [place] is able to accept patient and will start insurance auth today!   PT and OT paged, [name] MD made aware. COVID test ordered.   Insurance Josem Kaufmann has been initiated by [facility].  [name] has been updated and given contact information, phone number and address to [place]. Has provided his email as [email or web address] for paperwork to be sent to from [place] for admissions.   Updated PT and OT notes have been faxed to [facility] as of 3:21 pm today. Pending insurance auth at this time.    Expected Discharge Plan: Long Term Nursing Home Barriers to Discharge: No SNF bed  Expected Discharge Plan and Services Expected Discharge Plan: [place]     Post Acute Care Choice: Long Term Acute Care (LTAC) Living arrangements for the past 2 months: Single Family Home                                       Social Determinants of Health (SDOH) Interventions    Readmission Risk Interventions No flowsheet data found.

## 2018-11-07 NOTE — Progress Notes (Signed)
PROGRESS NOTE    Micheal Carey  ONG:[id number] DOB: [birth year] DOA: 09/08/2018 PCP: Fleet ContrasAvbuere, Edwin, MD     Brief Narrative:  Micheal LampHarvey Guaman is a 68 y.o. male with history of COPD, EtOH dependence who presented with PEA arrest with ROSC after 10 minutes.  Unfortunately patient remained unresponsive after ROSC. He was intubated and admitted to the ICU. Hospital course has been prolonged due to severe anoxic encephalopathy. Patient is now s/p PEG and trach.  There was also concern for aspiration pneumonia, patient was started on antibiotics.  New events last 24 hours / Subjective: No new events overnight.  Remains nonverbal.  Afebrile overnight.  Afebrile overnight.  Assessment & Plan:   Principal Problem:   Cardiac arrest [facility](HCC) Active Problems:   Alcohol abuse   COPD with acute exacerbation (HCC)   Pneumonia   COPD (chronic obstructive pulmonary disease) (HCC)   Sepsis (HCC)   Drug overdose   Malnutrition of moderate degree   Pressure injury of skin   Tracheostomy status (HCC)   Acute lower UTI   Anoxic encephalopathy (HCC)   Alcohol overdose   Dysphagia   Acute respiratory failure with hypoxia (HCC)   Palliative care by specialist   Goals of care, counseling/discussion   Advanced care planning/counseling discussion   PEA arrest with anoxic encephalopathy and persistent vegetative state: Remains stable. Continue Seroquel, Klonopin and Lyrica.  Remains on tube feeds.  Palliative care consulted, full scope of treatment recommended at this point per family's wishes.  Awaiting SNF.  Acute hypoxic respiratory failure: Intubated on admit, now s/p trach  Acinetobacter/Citrobacter PNA/UTI: Had completed a course of antimicrobial therapy. However now starting to have low-grade fever again, patient had vomited the night before. Question for some amount of atelectasis/infiltrate in the right lower lung field. Started on cefepime/Flagyl for concern for aspiration pneumonia  HTN:  continue Coreg, will increase dose due to continued sinus tachycardia  COPD: stable  Stage II pressure injury, left heel, not POA: Local wound care   DVT prophylaxis: SCD Code Status: Full Family Communication: None Disposition Plan: Pending SNF placement.  Repeat COVID test, repeat PT OT evaluation pending.   Antimicrobials:  Anti-infectives (From admission, onward)   Start     Dose/Rate Route Frequency Ordered Stop   11/05/18 1200  ceFEPIme (MAXIPIME) 2 g in sodium chloride 0.9 % 100 mL IVPB     2 g 200 mL/hr over 30 Minutes Intravenous Every 8 hours 11/05/18 1108 11/10/18 1159   11/05/18 1100  metroNIDAZOLE (FLAGYL) IVPB 500 mg     500 mg 100 mL/hr over 60 Minutes Intravenous Every 8 hours 11/05/18 1018 11/10/18 1059   09/30/18 1400  meropenem (MERREM) 1 g in sodium chloride 0.9 % 100 mL IVPB     1 g 200 mL/hr over 30 Minutes Intravenous Every 8 hours 09/30/18 1119 10/07/18 0539   09/27/18 1200  ceFEPIme (MAXIPIME) 2 g in sodium chloride 0.9 % 100 mL IVPB  Status:  Discontinued     2 g 200 mL/hr over 30 Minutes Intravenous Every 8 hours 09/27/18 1133 09/30/18 1119   09/18/18 1330  ceFAZolin (ANCEF) IVPB 2g/100 mL premix     2 g 200 mL/hr over 30 Minutes Intravenous To Radiology 09/18/18 1323 09/18/18 1356   09/17/18 0900  ceFAZolin (ANCEF) IVPB 2g/100 mL premix     2 g 200 mL/hr over 30 Minutes Intravenous To Radiology 09/17/18 0849 09/18/18 0900   09/14/18 1000  cefTRIAXone (ROCEPHIN) 2 g in sodium chloride 0.9 %  100 mL IVPB  Status:  Discontinued     2 g 200 mL/hr over 30 Minutes Intravenous Every 24 hours 09/14/18 0919 09/16/18 1040   09/12/18 0000  vancomycin (VANCOCIN) 1,250 mg in sodium chloride 0.9 % 250 mL IVPB  Status:  Discontinued     1,250 mg 166.7 mL/hr over 90 Minutes Intravenous Every 24 hours 09/11/18 0044 09/11/18 1229   09/12/18 0000  vancomycin (VANCOCIN) IVPB 1000 mg/200 mL premix  Status:  Discontinued     1,000 mg 200 mL/hr over 60 Minutes  Intravenous Every 24 hours 09/11/18 1229 09/13/18 1117   09/11/18 0800  piperacillin-tazobactam (ZOSYN) IVPB 3.375 g  Status:  Discontinued     3.375 g 12.5 mL/hr over 240 Minutes Intravenous Every 8 hours 09/11/18 0044 09/13/18 1117   09/11/18 0045  piperacillin-tazobactam (ZOSYN) IVPB 3.375 g     3.375 g 100 mL/hr over 30 Minutes Intravenous  Once 09/11/18 0040 09/11/18 0232   09/11/18 0045  vancomycin (VANCOCIN) 1,500 mg in sodium chloride 0.9 % 500 mL IVPB     1,500 mg 250 mL/hr over 120 Minutes Intravenous  Once 09/11/18 0040 09/11/18 0447       Objective: Vitals:   11/06/18 2321 11/07/18 0409 11/07/18 0800 11/07/18 0810  BP: 123/76 (!) 152/78  (!) 141/74  Pulse: (!) 105 (!) 109 97 (!) 106  Resp: 18   (!) 21  Temp: 99.4 F (37.4 C) 98.1 F (36.7 C)  98.1 F (36.7 C)  TempSrc: Axillary Axillary  Axillary  SpO2:   100% 99%  Weight:      Height:        Intake/Output Summary (Last 24 hours) at 11/07/2018 1038 Last data filed at 11/07/2018 0809 Gross per 24 hour  Intake 1780 ml  Output 951 ml  Net 829 ml   Filed Weights   11/03/18 0500 11/05/18 0617 11/06/18 0540  Weight: 61.7 kg 61.8 kg 61.5 kg    Examination: General exam: Appears calm, nonverbal Respiratory system: Trach collar in place, without respiratory distress Cardiovascular system: S1 & S2 heard, tachycardic, regular rhythm Gastrointestinal system: Abdomen is nondistended, soft  Central nervous system: Alert, nonverbal Extremities: Bilateral upper extremity contractures   Data Reviewed: I have personally reviewed following labs and imaging studies  CBC: Recent Labs  Lab 11/01/18 0135 11/05/18 0121  WBC 8.7 9.1  HGB 12.0* 12.6*  HCT 36.4* 37.4*  MCV 80.9 79.1*  PLT 298 294   Basic Metabolic Panel: Recent Labs  Lab 11/01/18 0135 11/05/18 0954  NA 138 137  K 3.9 4.2  CL 104 104  CO2 23 23  GLUCOSE 118* 115*  BUN 28* 25*  CREATININE 0.90 0.82  CALCIUM 9.5 9.2   GFR: Estimated  Creatinine Clearance: 77.1 mL/min (by C-G formula based on SCr of 0.82 mg/dL). Liver Function Tests: Recent Labs  Lab 11/01/18 0135  AST 24  ALT 22  ALKPHOS 97  BILITOT 0.5  PROT 6.9  ALBUMIN 2.9*   No results for input(s): LIPASE, AMYLASE in the last 168 hours. No results for input(s): AMMONIA in the last 168 hours. Coagulation Profile: No results for input(s): INR, PROTIME in the last 168 hours. Cardiac Enzymes: No results for input(s): CKTOTAL, CKMB, CKMBINDEX, TROPONINI in the last 168 hours. BNP (last 3 results) No results for input(s): PROBNP in the last 8760 hours. HbA1C: No results for input(s): HGBA1C in the last 72 hours. CBG: Recent Labs  Lab 11/02/18 2100 11/03/18 2221 11/04/18 2156 11/05/18 2125 11/06/18 2112  GLUCAP 130* 120* 142* 111* 106*   Lipid Profile: No results for input(s): CHOL, HDL, LDLCALC, TRIG, CHOLHDL, LDLDIRECT in the last 72 hours. Thyroid Function Tests: No results for input(s): TSH, T4TOTAL, FREET4, T3FREE, THYROIDAB in the last 72 hours. Anemia Panel: No results for input(s): VITAMINB12, FOLATE, FERRITIN, TIBC, IRON, RETICCTPCT in the last 72 hours. Sepsis Labs: No results for input(s): PROCALCITON, LATICACIDVEN in the last 168 hours.  No results found for this or any previous visit (from the past 240 hour(s)).    Radiology Studies: No results found.    Scheduled Meds: . carvedilol  12.5 mg Per Tube BID WC  . chlorhexidine  15 mL Mouth Rinse BID  . clonazePAM  0.5 mg Per Tube TID  . enoxaparin (LOVENOX) injection  40 mg Subcutaneous Q24H  . feeding supplement (PRO-STAT SUGAR FREE 64)  30 mL Per Tube BID  . folic acid  1 mg Per Tube Daily  . free water  200 mL Per Tube Q6H  . Gerhardt's butt cream   Topical QID  . guaiFENesin  5 mL Per Tube Q6H  . mouth rinse  15 mL Mouth Rinse q12n4p  . multivitamin  15 mL Per Tube Daily  . nutrition supplement (JUVEN)  1 packet Per Tube BID BM  . pregabalin  25 mg Per Tube BID  .  QUEtiapine  25 mg Per Tube QHS  . thiamine  100 mg Per Tube Daily   Continuous Infusions: . sodium chloride Stopped (10/08/18 2212)  . ceFEPime (MAXIPIME) IV Stopped (11/07/18 0445)  . feeding supplement (JEVITY 1.2 CAL) 60 mL/hr at 11/06/18 1600  . metronidazole 500 mg (11/07/18 0517)     LOS: 60 days      Time spent: 20 minutes   Dessa Phi, DO [facility] 11/07/2018, 10:38 AM   Available via Epic secure chat 7am-7pm After these hours, please refer to coverage provider listed on [email or web address]

## 2018-11-07 NOTE — Progress Notes (Addendum)
Late entry for 11/06/18 @ 2047. Patient seen and assessed. Physical assessment completed via computerized charting per [facility] Fall policy. PEG tube with Jevity 1.2 infusing at 60 ml/hr. Residuals assessed; 0 ml of residuals noted. HOB 30 degrees for aspiration precautions. Bilateral hands contracted. Side rails up x 3. Bed in lowest position and locked. No family present at bedside.

## 2018-11-07 NOTE — TOC Progression Note (Signed)
Transition of Care [facility]) - Progression Note    Patient Details  Name: Micheal Carey MRN: [id number] Date of Birth: 1950/06/02  Transition of Care [facility]) CM/SW [place], [place] Phone Number: [phone] 11/07/2018, 9:24 AM  Clinical Narrative:     CSW updated Tammy with [facility] that patient's suctioning needs have gone down to twice in the last 24 hours, they are reviewing his chart again and will let CSW know.   Expected Discharge Plan: Long Term Nursing Home Barriers to Discharge: No SNF bed  Expected Discharge Plan and Services Expected Discharge Plan: [place]     Post Acute Care Choice: Long Term Acute Care (LTAC) Living arrangements for the past 2 months: Single Family Home                                       Social Determinants of Health (SDOH) Interventions    Readmission Risk Interventions No flowsheet data found.

## 2018-11-07 NOTE — Progress Notes (Addendum)
Occupational Therapy Re- Evaluation Patient Details Name: Micheal Carey MRN: [id number] DOB: 08-Oct-1950 Today's Date: 11/07/2018    History of Present Illness Pt is a 68 y.o. man admitted 09/08/18 after found down with PEA arrest with ROSC after 10 minutes but pt remained unresponsive, +ETOH, opiates; pt intubated. Hospital course prolonged due to severe anoxic encephalopathy. S/p PEG and trach. Also concern for aspiration PNA. PMH includes COPD, ETOH abuse, opioid abuse.   Clinical Impression   Pt last seen by OT 9/2 (signed off at that time). During session, pt following commands inconsistently (close eyes; stick out tongue and nod head). Pt visually attending to OT and attempting to mouth/verbalize words. Ablet o make vocal sounds x 2.. Total A +2 to clean pt from incontinent episode. Pt then progressed to EOB with total A +2. Once EOB, pt following commands to look up/down with improved visual attention. Pt able to initiate trunk extension with verbal cues but unable to use BUE to help maintain midline postural control due to BUE flexor tone and contractures. Able to begin to reduce tone with rotational trunk movement.  Feel pt has demonstrated progress since least OT session and would benefit from rehab at [facility]. Will reassess splints and positioning devices.  Feel pt is appropriate for ST consult for use of PMSV.    Follow Up Recommendations  SNF;Supervision/Assistance - 24 hour    Equipment Recommendations  3 in 1 bedside commode    Recommendations for Other Services       Precautions / Restrictions Precautions Precautions: Fall;Other (comment) Precaution Comments: trach/peg; at risk for skin breakdown Restrictions Weight Bearing Restrictions: No      Mobility Bed Mobility Overal bed mobility: Needs Assistance Bed Mobility: Rolling;Supine to Sit;Sit to Supine Rolling: Total assist;+2 for physical assistance Sidelying to sit: Total assist;+2 for physical assistance Supine to  sit: Total assist;+2 for physical assistance Sit to supine: Total assist;+2 for physical assistance   General bed mobility comments: Pt able to initiate roll when cued to turn head, requiring totalA for bed mobility  Transfers                 General transfer comment: not attempted    Balance Overall balance assessment: Needs assistance Sitting-balance support: Feet supported;Bilateral upper extremity supported Sitting balance-Leahy Scale: Poor Sitting balance - Comments: Reliant on assist to maintain seated balance                                   ADL either performed or assessed with clinical judgement   ADL Overall ADL's : Needs assistance/impaired   Eating/Feeding Details (indicate cue type and reason): peg                                   General ADL Comments: requires total care     Vision   Vision Assessment?: Vision impaired- to be further tested in functional context Additional Comments: Pt able to visually attend to therapist; turns head to sound on command     Perception Perception Comments: impaired. will further assess   Praxis Praxis Praxis tested?: Deficits Deficits: Initiation Praxis-Other Comments: apparent motor planning deficits    Pertinent Vitals/Pain Pain Assessment: Faces Faces Pain Scale: Hurts little more Pain Location: All extremities, neck and especially hands with repositioning/stretching Pain Descriptors / Indicators: Grimacing Pain Intervention(s): Limited activity within patient's tolerance;Repositioned  Hand Dominance (unsure)   Extremity/Trunk Assessment Upper Extremity Assessment Upper Extremity Assessment: RUE deficits/detail;LUE deficits/detail RUE Deficits / Details:  RUE flexion contractures of elbow, wrist and shoulder; poor scapular glide - releases with inhibition but limited ROM. would benefit form scapular mobilization; ableto get wrist to neutral with @ 30 degrees composite  flexion contracture of digits - unable to achieve fuoll extension; has splint; need to further assess proper fit; non-functional RUE Coordination: decreased fine motor;decreased gross motor(nonfunctional) LUE Deficits / Details: same as right but not as contracted; flexor tone   Lower Extremity Assessment Lower Extremity Assessment: Defer to PT evaluation RLE Deficits / Details: Bilateral flexion contractures, increased tone/rigidity throughout, active movement in BLEs when guarding due to pain, not moving to command   Cervical / Trunk Assessment Cervical / Trunk Assessment: Kyphotic   Communication Communication Communication: Tracheostomy   Cognition Arousal/Alertness: Awake/alert Behavior During Therapy: Flat affect Overall Cognitive Status: Impaired/Different from baseline                     Current Attention Level: Focused   Following Commands: Follows one step commands inconsistently Safety/Judgement: Decreased awareness of safety;Decreased awareness of deficits Awareness: Intellectual Problem Solving: Slow processing;Decreased initiation General Comments: Open eyes and attempts to attend to person speaking to him, following some simple commands, grimacing to pain, at times attempting to mouth words and verbalize words   General Comments  Pt initialying head extension when sitting EOB; attempting to prop on UE in sitting - difficult dueto contractures and abnormal tone.    Exercises Exercises: General Upper Extremity General Exercises - Upper Extremity Shoulder Flexion: PROM;5 reps;Both;Supine Shoulder Extension: PROM;Both;5 reps;Supine Shoulder ABduction: PROM;Both;5 reps;Supine Elbow Flexion: PROM;Both;5 reps;Supine Elbow Extension: PROM;Both;5 reps;Supine Wrist Flexion: PROM;Both;5 reps;Supine Wrist Extension: PROM;Both;5 reps;Supine Digit Composite Flexion: PROM;Both;Supine Composite Extension: PROM;Both;5 reps;Supine Other Exercises Other Exercises: neck  ROM; able to actively turn head to R/L Other Exercises:    Shoulder Instructions      Home Living Family/patient expects to be discharged to:: Skilled nursing facility                                        Prior Functioning/Environment Level of Independence: Independent        Comments: pt was found down in a parking lot        OT Problem List: Decreased strength;Decreased range of motion;Decreased activity tolerance;Impaired balance (sitting and/or standing);Impaired vision/perception;Decreased coordination;Decreased cognition;Decreased safety awareness;Decreased knowledge of use of DME or AE;Impaired sensation;Impaired tone;Impaired UE functional use;Pain;Increased edema      OT Treatment/Interventions: Self-care/ADL training;Therapeutic exercise;Neuromuscular education;DME and/or AE instruction;Therapeutic activities;Cognitive remediation/compensation;Visual/perceptual remediation/compensation;Patient/family education;Balance training;Splinting    OT Goals(Current goals can be found in the care plan section) Acute Rehab OT Goals Patient Stated Goal: unable to state OT Goal Formulation: Patient unable to participate in goal setting Time For Goal Achievement: 11/21/18 Potential to Achieve Goals: Fair  OT Frequency: Min 2X/week   Barriers to D/C:            Co-evaluation PT/OT/SLP Co-Evaluation/Treatment: Yes Reason for Co-Treatment: Complexity of the patient's impairments (multi-system involvement);For patient/therapist safety;To address functional/ADL transfers PT goals addressed during session: Mobility/safety with mobility;Balance OT goals addressed during session: ADL's and self-care;Strengthening/ROM      AM-PAC OT "6 Clicks" Daily Activity     Outcome Measure Help from another person eating meals?: Total Help from another person taking care  of personal grooming?: Total Help from another person toileting, which includes using toliet, bedpan, or  urinal?: Total Help from another person bathing (including washing, rinsing, drying)?: Total Help from another person to put on and taking off regular upper body clothing?: Total Help from another person to put on and taking off regular lower body clothing?: Total 6 Click Score: 6   End of Session Equipment Utilized During Treatment: Oxygen Nurse Communication: Mobility status  Activity Tolerance: Patient tolerated treatment well Patient left: in bed;with bed alarm set;with call bell/phone within reach  OT Visit Diagnosis: Other abnormalities of gait and mobility (R26.89);Muscle weakness (generalized) (M62.81);Low vision, both eyes (H54.2);Cognitive communication deficit (R41.841);Pain Pain - part of body: (with all movemetn, esp extremities)                Time: [id number] OT Time Calculation (min): 39 min Charges:  OT General Charges $OT Visit: 1 Visit OT Evaluation $OT Re-eval: 1 Re-eval  Luisa DagoHilary Lace [place]nevert, OT/L   Acute OT Clinical Specialist Acute Rehabilitation Services Pager [phone] Office [phone]   [facility] CorporationWARD,HILLARY 11/07/2018, 1:29 PM

## 2018-11-08 LAB — GLUCOSE, CAPILLARY: Glucose-Capillary: 126 mg/dL — ABNORMAL HIGH (ref 70–99)

## 2018-11-08 LAB — NOVEL CORONAVIRUS, NAA (HOSP ORDER, SEND-OUT TO REF LAB; TAT 18-24 HRS): SARS-CoV-2, NAA: NOT DETECTED

## 2018-11-08 MED ORDER — PREGABALIN 25 MG PO CAPS: 25.0000 mg | ORAL_CAPSULE | Freq: Two times a day (BID) | ORAL | 0 refills | Status: AC

## 2018-11-08 MED ORDER — CLONAZEPAM 0.5 MG PO TABS: 0.5000 mg | ORAL_TABLET | Freq: Three times a day (TID) | ORAL | 0 refills | Status: AC

## 2018-11-08 MED ORDER — AMOXICILLIN-POT CLAVULANATE 875-125 MG PO TABS: 1.0000 | ORAL_TABLET | Freq: Two times a day (BID) | ORAL | 0 refills | Status: AC

## 2018-11-08 MED ORDER — OXYCODONE HCL 5 MG/5ML PO SOLN: 5.0000 mg | Freq: Three times a day (TID) | ORAL | 0 refills | Status: AC | PRN

## 2018-11-08 MED ORDER — CARVEDILOL 12.5 MG PO TABS: 12.5000 mg | ORAL_TABLET | Freq: Two times a day (BID) | ORAL | 0 refills | Status: AC

## 2018-11-08 MED ORDER — QUETIAPINE FUMARATE 25 MG PO TABS: 25.0000 mg | ORAL_TABLET | Freq: Every day | ORAL | 0 refills | Status: AC

## 2018-11-08 NOTE — Progress Notes (Signed)
SLP Cancellation Note  Patient Details Name: Jonte Shiller MRN: [id number] DOB: 03/30/1950   Cancelled treatment:       Reason Eval/Treat Not Completed: Other (comment). Attempted session but pt in need of RN care. Will f/u next date.   Herbie Baltimore, MA CCC-SLP  Acute Rehabilitation Services Pager [phone] Office [phone]  Lynann Beaver 11/08/2018, 1:19 PM

## 2018-11-08 NOTE — Care Management Important Message (Signed)
Important Message  Patient Details  Name: Micheal Carey MRN: [id number] Date of Birth: 07/15/1950   Medicare Important Message Given:  Yes     [name] 11/08/2018, 3:12 PM

## 2018-11-08 NOTE — Progress Notes (Signed)
PROGRESS NOTE    Micheal Carey  ZOX:[id number] DOB: 09/26/1950 DOA: 09/08/2018 PCP: Fleet Contras, MD     Brief Narrative:  Micheal Carey is a 68 y.o. male with history of COPD, EtOH dependence who presented with PEA arrest with ROSC after 10 minutes.  Unfortunately patient remained unresponsive after ROSC. He was intubated and admitted to the ICU. Hospital course has been prolonged due to severe anoxic encephalopathy. Patient is now s/p PEG and trach.  There was also concern for aspiration pneumonia, patient was started on antibiotics.  New events last 24 hours / Subjective: No acute events.  Assessment & Plan:   Principal Problem:   Cardiac arrest [facility]) Active Problems:   Alcohol abuse   COPD with acute exacerbation (HCC)   Pneumonia   COPD (chronic obstructive pulmonary disease) (HCC)   Sepsis (HCC)   Drug overdose   Malnutrition of moderate degree   Pressure injury of skin   Tracheostomy status (HCC)   Acute lower UTI   Anoxic encephalopathy (HCC)   Alcohol overdose   Dysphagia   Acute respiratory failure with hypoxia (HCC)   Palliative care by specialist   Goals of care, counseling/discussion   Advanced care planning/counseling discussion   PEA arrest with anoxic encephalopathy and persistent vegetative state: Remains stable. Continue Seroquel, Klonopin and Lyrica.  Remains on tube feeds.  Palliative care consulted, full scope of treatment recommended at this point per family's wishes.  Awaiting SNF.  Acute hypoxic respiratory failure: Intubated on admit, now s/p trach  Acinetobacter/Citrobacter PNA/UTI: Had completed a course of antimicrobial therapy. However now starting to have low-grade fever again, patient had vomited the night before. Question for some amount of atelectasis/infiltrate in the right lower lung field. Started on cefepime/Flagyl for concern for aspiration pneumonia  HTN: continue Coreg  COPD: stable  Stage II pressure injury, left heel, not  POA: Local wound care   DVT prophylaxis: SCD Code Status: Full Family Communication: None Disposition Plan: Pending SNF placement.  Repeat COVID test negative   Antimicrobials:  Anti-infectives (From admission, onward)   Start     Dose/Rate Route Frequency Ordered Stop   11/05/18 1200  ceFEPIme (MAXIPIME) 2 g in sodium chloride 0.9 % 100 mL IVPB     2 g 200 mL/hr over 30 Minutes Intravenous Every 8 hours 11/05/18 1108 11/10/18 1159   11/05/18 1100  metroNIDAZOLE (FLAGYL) IVPB 500 mg     500 mg 100 mL/hr over 60 Minutes Intravenous Every 8 hours 11/05/18 1018 11/10/18 1059   09/30/18 1400  meropenem (MERREM) 1 g in sodium chloride 0.9 % 100 mL IVPB     1 g 200 mL/hr over 30 Minutes Intravenous Every 8 hours 09/30/18 1119 10/07/18 0539   09/27/18 1200  ceFEPIme (MAXIPIME) 2 g in sodium chloride 0.9 % 100 mL IVPB  Status:  Discontinued     2 g 200 mL/hr over 30 Minutes Intravenous Every 8 hours 09/27/18 1133 09/30/18 1119   09/18/18 1330  ceFAZolin (ANCEF) IVPB 2g/100 mL premix     2 g 200 mL/hr over 30 Minutes Intravenous To Radiology 09/18/18 1323 09/18/18 1356   09/17/18 0900  ceFAZolin (ANCEF) IVPB 2g/100 mL premix     2 g 200 mL/hr over 30 Minutes Intravenous To Radiology 09/17/18 0849 09/18/18 0900   09/14/18 1000  cefTRIAXone (ROCEPHIN) 2 g in sodium chloride 0.9 % 100 mL IVPB  Status:  Discontinued     2 g 200 mL/hr over 30 Minutes Intravenous Every 24 hours  09/14/18 0919 09/16/18 1040   09/12/18 0000  vancomycin (VANCOCIN) 1,250 mg in sodium chloride 0.9 % 250 mL IVPB  Status:  Discontinued     1,250 mg 166.7 mL/hr over 90 Minutes Intravenous Every 24 hours 09/11/18 0044 09/11/18 1229   09/12/18 0000  vancomycin (VANCOCIN) IVPB 1000 mg/200 mL premix  Status:  Discontinued     1,000 mg 200 mL/hr over 60 Minutes Intravenous Every 24 hours 09/11/18 1229 09/13/18 1117   09/11/18 0800  piperacillin-tazobactam (ZOSYN) IVPB 3.375 g  Status:  Discontinued     3.375 g 12.5 mL/hr  over 240 Minutes Intravenous Every 8 hours 09/11/18 0044 09/13/18 1117   09/11/18 0045  piperacillin-tazobactam (ZOSYN) IVPB 3.375 g     3.375 g 100 mL/hr over 30 Minutes Intravenous  Once 09/11/18 0040 09/11/18 0232   09/11/18 0045  vancomycin (VANCOCIN) 1,500 mg in sodium chloride 0.9 % 500 mL IVPB     1,500 mg 250 mL/hr over 120 Minutes Intravenous  Once 09/11/18 0040 09/11/18 0447       Objective: Vitals:   11/08/18 0300 11/08/18 0500 11/08/18 0748 11/08/18 0804  BP: (!) 133/103  (!) 155/79   Pulse: (!) 102  100 (!) 107  Resp: 16  20 20   Temp:   98.1 F (36.7 C)   TempSrc:   Oral   SpO2: 99%  96% 95%  Weight:  62.4 kg    Height:        Intake/Output Summary (Last 24 hours) at 11/08/2018 1200 Last data filed at 11/07/2018 1242 Gross per 24 hour  Intake 273.88 ml  Output 250 ml  Net 23.88 ml   Filed Weights   11/05/18 0617 11/06/18 0540 11/08/18 0500  Weight: 61.8 kg 61.5 kg 62.4 kg    Examination: General exam: Appears calm, nonverbal Respiratory system: Clear to auscultation anteriorly, on trach collar. Respiratory effort normal. Cardiovascular system: S1 & S2 heard, tachycardic, regular. No JVD, murmurs, rubs, gallops or clicks. No pedal edema. Gastrointestinal system: Abdomen is nondistended, soft and nontender. Central nervous system: Alert but remains nonverbal Extremities: Contracture of the upper extremities bilaterally  Data Reviewed: I have personally reviewed following labs and imaging studies  CBC: Recent Labs  Lab 11/05/18 0121  WBC 9.1  HGB 12.6*  HCT 37.4*  MCV 79.1*  PLT 294   Basic Metabolic Panel: Recent Labs  Lab 11/05/18 0954  NA 137  K 4.2  CL 104  CO2 23  GLUCOSE 115*  BUN 25*  CREATININE 0.82  CALCIUM 9.2   GFR: Estimated Creatinine Clearance: 78.2 mL/min (by C-G formula based on SCr of 0.82 mg/dL). Liver Function Tests: No results for input(s): AST, ALT, ALKPHOS, BILITOT, PROT, ALBUMIN in the last 168 hours. No results  for input(s): LIPASE, AMYLASE in the last 168 hours. No results for input(s): AMMONIA in the last 168 hours. Coagulation Profile: No results for input(s): INR, PROTIME in the last 168 hours. Cardiac Enzymes: No results for input(s): CKTOTAL, CKMB, CKMBINDEX, TROPONINI in the last 168 hours. BNP (last 3 results) No results for input(s): PROBNP in the last 8760 hours. HbA1C: No results for input(s): HGBA1C in the last 72 hours. CBG: Recent Labs  Lab 11/03/18 2221 11/04/18 2156 11/05/18 2125 11/06/18 2112 11/07/18 2120  GLUCAP 120* 142* 111* 106* 117*   Lipid Profile: No results for input(s): CHOL, HDL, LDLCALC, TRIG, CHOLHDL, LDLDIRECT in the last 72 hours. Thyroid Function Tests: No results for input(s): TSH, T4TOTAL, FREET4, T3FREE, THYROIDAB in the last  72 hours. Anemia Panel: No results for input(s): VITAMINB12, FOLATE, FERRITIN, TIBC, IRON, RETICCTPCT in the last 72 hours. Sepsis Labs: No results for input(s): PROCALCITON, LATICACIDVEN in the last 168 hours.  Recent Results (from the past 240 hour(s))  Novel Coronavirus, NAA (Hosp order, Send-out to Ref Lab; TAT 18-24 hrs     Status: None   Collection Time: 11/07/18 11:43 AM   Specimen: Nasopharyngeal Swab; Respiratory  Result Value Ref Range Status   SARS-CoV-2, NAA NOT DETECTED NOT DETECTED Final    Comment: (NOTE) This nucleic acid amplification test was developed and its performance characteristics determined by [employer]. Nucleic acid amplification tests include PCR and TMA. This test has not been FDA cleared or approved. This test has been authorized by FDA under an Emergency Use Authorization (EUA). This test is only authorized for the duration of time the declaration that circumstances exist justifying the authorization of the emergency use of in vitro diagnostic tests for detection of SARS-CoV-2 virus and/or diagnosis of COVID-19 infection under section 564(b)(1) of the Act, 21 U.S.C. 646OEH-2(Z)  (1), unless the authorization is terminated or revoked sooner. When diagnostic testing is negative, the possibility of a false negative result should be considered in the context of a patient's recent exposures and the presence of clinical signs and symptoms consistent with COVID-19. An individual without symptoms of COVID- 19 and who is not shedding SARS-CoV-2 vi rus would expect to have a negative (not detected) result in this assay. Performed At: [facility] [address] [place], [place] [id number] Rush Farmer MD BC:[id number]    [place]  Final    Comment: Performed at [facility], [place] [address], [place], [place] [address]      Radiology Studies: No results found.    Scheduled Meds:  carvedilol  12.5 mg Per Tube BID WC   chlorhexidine  15 mL Mouth Rinse BID   clonazePAM  0.5 mg Per Tube TID   enoxaparin (LOVENOX) injection  40 mg Subcutaneous Q24H   feeding supplement (PRO-STAT SUGAR FREE 64)  30 mL Per Tube BID   folic acid  1 mg Per Tube Daily   free water  200 mL Per Tube Q6H   Gerhardt's butt cream   Topical QID   guaiFENesin  5 mL Per Tube Q6H   mouth rinse  15 mL Mouth Rinse q12n4p   multivitamin  15 mL Per Tube Daily   nutrition supplement (JUVEN)  1 packet Per Tube BID BM   pregabalin  25 mg Per Tube BID   QUEtiapine  25 mg Per Tube QHS   thiamine  100 mg Per Tube Daily   Continuous Infusions:  sodium chloride Stopped (10/08/18 2212)   ceFEPime (MAXIPIME) IV 2 g (11/08/18 0532)   feeding supplement (JEVITY 1.2 CAL) 60 mL/hr (11/08/18 1122)   metronidazole 500 mg (11/08/18 1038)     LOS: 61 days      Time spent: 20 minutes   Dessa Phi, DO [facility] 11/08/2018, 12:00 PM   Available via Epic secure chat 7am-7pm After these hours, please refer to coverage provider listed on [email or web address]

## 2018-11-08 NOTE — Progress Notes (Signed)
Spoke  with [facility] of nursing in [facility] who claimed that they cannot accept pt with trach since they don't have any respiratory therapist. MD aware and cancelled discharge. [facility] was made aware by secretary and cancelled transport.

## 2018-11-08 NOTE — Discharge Summary (Addendum)
Physician Discharge Summary  Micheal Carey WUJ:[id number] DOB: [birth year] DOA: 09/08/2018  PCP: [name], MD  Admit date: 09/08/2018 Discharge date: 11/09/2018  Discharge Condition: Stable CODE STATUS: Full  Diet recommendation: NPO. Tube feeding through PEG.   Brief/Interim Summary: Micheal Carey is Pierre.Alas y.o.malewith history of COPD, EtOH and opiate dependence who presented with PEA arrest with ROSC after 10 minutes.  He was apparently found down in the parking lot.  UDS was positive for opiates and alcohol level was also positive. Unfortunately patient remained unresponsive after ROSC. He was intubated and admitted to the ICU on 8/9. Hospital course has been prolonged due to severe anoxic encephalopathy.  He has remained bedbound, nonverbal without meaningful interaction.  Palliative care has been involved, currently family hoping for full scope of care.  Patient is now s/p PEG and trach.  There was also concern for aspiration pneumonia, patient was started on antibiotics.   Tracheostomy Shiley 6 mm Laryngectomy;Uncuffed On trach collar, 21% FiO2, room air Use O2 flow rate 5L/min for humidification only Suction as needed   TF Jevity 1.2 Cal continuous @ 60 ml/hr Pro-stat 65ml BID  Free water flushes 275ml QID   Discharge Diagnoses:  Principal Problem:   Cardiac arrest ([place]) Active Problems:   Alcohol abuse   COPD with acute exacerbation ([place])   Pneumonia   COPD (chronic obstructive pulmonary disease) (HCC)   Sepsis ([place])   Drug overdose   Malnutrition of moderate degree   Pressure injury of skin   Tracheostomy status ([place])   Acute lower UTI   Anoxic encephalopathy (HCC)   Alcohol overdose   Dysphagia   Acute respiratory failure with hypoxia ([place])   Palliative care by specialist   Goals of care, counseling/discussion   Advanced care planning/counseling discussion   PEA arrest with anoxic encephalopathy and persistent vegetative state:Remains stable. Continue  Seroquel, Klonopin and Lyrica. Remains on tube feeds. Palliative care consulted, full scope of treatment recommended at this point per family's wishes.   Acute hypoxic respiratory failure:Intubated on admit, now s/p trach  Acinetobacter/Citrobacter PNA/UTI:Hadcompleted a course of antimicrobial therapy. However now starting to have low-grade fever again. Question for some amount of atelectasis/infiltrate in the right lower lung field. Started on cefepime/Flagyl for concern for aspiration pneumonia --> Augmentin to complete total 7-day course  NFA:OZHYQMVH Coreg  COPD: stable  Stage II pressure injury, left heel, not POA: Local wound care   Discharge Instructions   Allergies as of 11/08/2018   No Known Allergies     Medication List    STOP taking these medications   albuterol (2.5 MG/3ML) 0.083% nebulizer solution Commonly known as: PROVENTIL   albuterol 108 (90 Base) MCG/ACT inhaler Commonly known as: VENTOLIN HFA   amLODipine 5 MG tablet Commonly known as: NORVASC   budesonide-formoterol 160-4.5 MCG/ACT inhaler Commonly known as: SYMBICORT   Bystolic 5 MG tablet Generic drug: nebivolol   cloNIDine 0.1 MG tablet Commonly known as: Catapres   diclofenac sodium 1 % Gel Commonly known as: VOLTAREN   multivitamin with minerals tablet   naproxen 500 MG tablet Commonly known as: NAPROSYN   oxyCODONE-acetaminophen 7.5-325 MG tablet Commonly known as: PERCOCET   potassium chloride SA 20 MEQ tablet Commonly known as: KLOR-CON   sildenafil 100 MG tablet Commonly known as: VIAGRA   tiZANidine 4 MG tablet Commonly known as: ZANAFLEX   VITAMIN D PO     TAKE these medications   amoxicillin-clavulanate 875-125 MG tablet Commonly known as: Augmentin Place 1 tablet into feeding  tube 2 (two) times daily for 3 days.   carvedilol 12.5 MG tablet Commonly known as: COREG Place 1 tablet (12.5 mg total) into feeding tube 2 (two) times daily with a meal.    clonazePAM 0.5 MG tablet Commonly known as: KLONOPIN Place 1 tablet (0.5 mg total) into feeding tube 3 (three) times daily.   oxyCODONE 5 MG/5ML solution Commonly known as: ROXICODONE Place 5 mLs (5 mg total) into feeding tube every 8 (eight) hours as needed for severe pain.   pregabalin 25 MG capsule Commonly known as: LYRICA Place 1 capsule (25 mg total) into feeding tube 2 (two) times daily. What changed:   medication strength  See the new instructions.   QUEtiapine 25 MG tablet Commonly known as: SEROQUEL Place 1 tablet (25 mg total) into feeding tube at bedtime.       No Known Allergies    Procedures/Studies: Dg Chest Port 1 View  Result Date: 11/04/2018 CLINICAL DATA:  Nonresponsive, low fever yesterday, history COPD, cardiac arrest, ethanol abuse, pneumonia, drug overdose EXAM: PORTABLE CHEST 1 VIEW COMPARISON:  Portable exam 0827 hours compared to 10/26/2018 FINDINGS: Tracheostomy tube stable projecting over tracheal air column. Normal heart size, mediastinal contours, and pulmonary vascularity. Minimal atelectasis or infiltrate at RIGHT base. Remaining lungs clear. No pleural effusion or pneumothorax. Bones unremarkable. IMPRESSION: Minimal atelectasis or infiltrate at RIGHT base. Electronically Signed   By: Ulyses SouthwardMark  Boles M.D.   On: 11/04/2018 08:39   Dg Chest Port 1 View  Result Date: 10/26/2018 CLINICAL DATA:  Pneumonia, tracheostomy EXAM: PORTABLE CHEST 1 VIEW COMPARISON:  10/25/2018 FINDINGS: Unchanged AP portable examination with bandlike scarring or atelectasis of the right midlung. No new or acute appearing airspace opacity. Unchanged elevation of the left hemidiaphragm. Tracheostomy. Heart and mediastinum are unremarkable. IMPRESSION: Unchanged AP portable examination with bandlike scarring or atelectasis of the right midlung. No new or acute appearing airspace opacity. Unchanged elevation of the left hemidiaphragm. Tracheostomy. Electronically Signed   By: Lauralyn PrimesAlex   Bibbey M.D.   On: 10/26/2018 11:51   Dg Chest Port 1 View  Result Date: 10/25/2018 CLINICAL DATA:  Fever EXAM: PORTABLE CHEST 1 VIEW COMPARISON:  09/30/2018 FINDINGS: Lungs are well aerated and clear. Negative for pneumonia. Negative for heart failure or effusion. Tracheostomy remains in good position. IMPRESSION: No active disease. Electronically Signed   By: Marlan Palauharles  Clark M.D.   On: 10/25/2018 10:06       Discharge Exam: Vitals:   11/08/18 1210 11/08/18 1253  BP:  (!) 142/99  Pulse: 87 98  Resp:  15  Temp:    SpO2:  100%    General exam: Appears calm, nonverbal Respiratory system: Clear to auscultation anteriorly, on trach collar. Respiratory effort normal. Cardiovascular system: S1 & S2 heard, tachycardic, regular. No JVD, murmurs, rubs, gallops or clicks. No pedal edema. Gastrointestinal system: Abdomen is nondistended, soft and nontender. Central nervous system: Alert but remains nonverbal Extremities: Contracture of the upper extremities bilaterally   The results of significant diagnostics from this hospitalization (including imaging, microbiology, ancillary and laboratory) are listed below for reference.     Microbiology: Recent Results (from the past 240 hour(s))  Novel Coronavirus, NAA (Hosp order, Send-out to Ref Lab; TAT 18-24 hrs     Status: None   Collection Time: 11/07/18 11:43 AM   Specimen: Nasopharyngeal Swab; Respiratory  Result Value Ref Range Status   SARS-CoV-2, NAA NOT DETECTED NOT DETECTED Final    Comment: (NOTE) This nucleic acid amplification test was developed and  its performance characteristics determined by [employer]. Nucleic acid amplification tests include PCR and TMA. This test has not been FDA cleared or approved. This test has been authorized by FDA under an Emergency Use Authorization (EUA). This test is only authorized for the duration of time the declaration that circumstances exist justifying the authorization of the  emergency use of in vitro diagnostic tests for detection of SARS-CoV-2 virus and/or diagnosis of COVID-19 infection under section 564(b)(1) of the Act, 21 U.S.C. 476LYY-5(K) (1), unless the authorization is terminated or revoked sooner. When diagnostic testing is negative, the possibility of a false negative result should be considered in the context of a patient's recent exposures and the presence of clinical signs and symptoms consistent with COVID-19. An individual without symptoms of COVID- 19 and who is not shedding SARS-CoV-2 vi rus would expect to have a negative (not detected) result in this assay. Performed At: [facility] [address] [place], [place] [id number] Jolene Schimke MD XN:[id number]    Coronavirus Source NASOPHARYNGEAL  Final    Comment: Performed at [facility], [address], [place], [place] [address]     Labs: BNP (last 3 results) Recent Labs    09/08/18 2037  BNP 48.2   Basic Metabolic Panel: Recent Labs  Lab 11/05/18 0954  NA 137  K 4.2  CL 104  CO2 23  GLUCOSE 115*  BUN 25*  CREATININE 0.82  CALCIUM 9.2   Liver Function Tests: No results for input(s): AST, ALT, ALKPHOS, BILITOT, PROT, ALBUMIN in the last 168 hours. No results for input(s): LIPASE, AMYLASE in the last 168 hours. No results for input(s): AMMONIA in the last 168 hours. CBC: Recent Labs  Lab 11/05/18 0121  WBC 9.1  HGB 12.6*  HCT 37.4*  MCV 79.1*  PLT 294   Cardiac Enzymes: No results for input(s): CKTOTAL, CKMB, CKMBINDEX, TROPONINI in the last 168 hours. BNP: Invalid input(s): POCBNP CBG: Recent Labs  Lab 11/03/18 2221 11/04/18 2156 11/05/18 2125 11/06/18 2112 11/07/18 2120  GLUCAP 120* 142* 111* 106* 117*   D-Dimer No results for input(s): DDIMER in the last 72 hours. Hgb A1c No results for input(s): HGBA1C in the last 72 hours. Lipid Profile No results for input(s): CHOL, HDL, LDLCALC, TRIG, CHOLHDL, LDLDIRECT in the last 72  hours. Thyroid function studies No results for input(s): TSH, T4TOTAL, T3FREE, THYROIDAB in the last 72 hours.  Invalid input(s): FREET3 Anemia work up No results for input(s): VITAMINB12, FOLATE, FERRITIN, TIBC, IRON, RETICCTPCT in the last 72 hours. Urinalysis    Component Value Date/Time   COLORURINE YELLOW 10/25/2018 1016   APPEARANCEUR CLEAR 10/25/2018 1016   LABSPEC 1.015 10/25/2018 1016   PHURINE 6.0 10/25/2018 1016   GLUCOSEU NEGATIVE 10/25/2018 1016   HGBUR NEGATIVE 10/25/2018 1016   BILIRUBINUR NEGATIVE 10/25/2018 1016   KETONESUR NEGATIVE 10/25/2018 1016   PROTEINUR NEGATIVE 10/25/2018 1016   NITRITE NEGATIVE 10/25/2018 1016   LEUKOCYTESUR NEGATIVE 10/25/2018 1016   Sepsis Labs Invalid input(s): PROCALCITONIN,  WBC,  LACTICIDVEN Microbiology Recent Results (from the past 240 hour(s))  Novel Coronavirus, NAA (Hosp order, Send-out to Ref Lab; TAT 18-24 hrs     Status: None   Collection Time: 11/07/18 11:43 AM   Specimen: Nasopharyngeal Swab; Respiratory  Result Value Ref Range Status   SARS-CoV-2, NAA NOT DETECTED NOT DETECTED Final    Comment: (NOTE) This nucleic acid amplification test was developed and its performance characteristics determined by [employer]. Nucleic acid amplification tests include PCR  and TMA. This test has not been FDA cleared or approved. This test has been authorized by FDA under an Emergency Use Authorization (EUA). This test is only authorized for the duration of time the declaration that circumstances exist justifying the authorization of the emergency use of in vitro diagnostic tests for detection of SARS-CoV-2 virus and/or diagnosis of COVID-19 infection under section 564(b)(1) of the Act, 21 U.S.C. 161WRU-0(A) (1), unless the authorization is terminated or revoked sooner. When diagnostic testing is negative, the possibility of a false negative result should be considered in the context of a patient's recent exposures and  the presence of clinical signs and symptoms consistent with COVID-19. An individual without symptoms of COVID- 19 and who is not shedding SARS-CoV-2 vi rus would expect to have a negative (not detected) result in this assay. Performed At: [facility] [address] [place], [place] [id number] Jolene Schimke MD YN:[id number]    Coronavirus Source NASOPHARYNGEAL  Final    Comment: Performed at [facility], [address] [place], [place] [address]     Patient was seen and examined on the day of discharge and was found to be in stable condition. Time coordinating discharge: 40 minutes including assessment and coordination of care, as well as examination of the patient.   SIGNED:  Noralee Stain, DO [facility] 11/08/2018, 3:36 PM

## 2018-11-08 NOTE — Progress Notes (Signed)
Occupational Therapy Treatment Patient Details Name: Micheal Carey MRN: [id number] DOB: 1950-05-19 Today's Date: 11/08/2018    History of present illness Pt is a 68 y.o. man admitted 09/08/18 after found down with PEA arrest with ROSC after 10 minutes but pt remained unresponsive, +ETOH, opiates; pt intubated. Hospital course prolonged due to severe anoxic encephalopathy. S/p PEG and trach. Also concern for aspiration PNA. PMH includes COPD, ETOH abuse, opioid abuse.   OT comments  Pt seen for tone inhibition and PROM of all extremities.  Was able to achieve full Passive extension of bil. Knees and Rt elbow.  Lt elbow with soft end feel contracture at ~20*.    Follow Up Recommendations  SNF;Supervision/Assistance - 24 hour    Equipment Recommendations  None recommended by OT    Recommendations for Other Services      Precautions / Restrictions Precautions Precautions: Fall;Other (comment) Precaution Comments: trach/peg; at risk for skin breakdown       Mobility Bed Mobility     Rolling: Total assist            Transfers                      Balance                                           ADL either performed or assessed with clinical judgement   ADL                                               Vision       Perception     Praxis      Cognition Arousal/Alertness: Awake/alert Behavior During Therapy: Flat affect Overall Cognitive Status: Impaired/Different from baseline                                 General Comments: Pt with eyes open.  He will fixate on me visually.  He mouthed in response to "hello Micheal Carey" and "how are you" - it did appear he attempted to respond         Exercises Other Exercises Other Exercises: prolonged gentle stretch with inhibitory techniques performed to bil. UEs and bil. LEs  Other Exercises: PROM neck rotation Lt and Rt performed x 5    Shoulder Instructions        General Comments      Pertinent Vitals/ Pain       Pain Assessment: Faces Faces Pain Scale: Hurts even more Pain Location: all extremeties, neck, and trunk with movement  Pain Descriptors / Indicators: Grimacing Pain Intervention(s): Premedicated before session  Home Living                                          Prior Functioning/Environment              Frequency  Min 2X/week        Progress Toward Goals  OT Goals(current goals can now be found in the care plan section)  Progress towards OT goals: Progressing toward goals     Plan  Discharge plan remains appropriate    Co-evaluation                 AM-PAC OT "6 Clicks" Daily Activity     Outcome Measure   Help from another person eating meals?: Total Help from another person taking care of personal grooming?: Total Help from another person toileting, which includes using toliet, bedpan, or urinal?: Total Help from another person bathing (including washing, rinsing, drying)?: Total Help from another person to put on and taking off regular upper body clothing?: Total Help from another person to put on and taking off regular lower body clothing?: Total 6 Click Score: 6    End of Session Equipment Utilized During Treatment: Oxygen      Activity Tolerance Patient tolerated treatment well   Patient Left in bed;with bed alarm set;with call bell/phone within reach   Nurse Communication Mobility status        Time: 1458-1520 OT Time Calculation (min): 22 min  Charges: OT General Charges $OT Visit: 1 Visit OT Treatments $Neuromuscular Re-education: 8-22 mins  Micheal Carey, OTR/L Acute Rehabilitation Services Pager [phone] Office [phone]    Micheal Carey M 11/08/2018, 3:45 PM

## 2018-11-08 NOTE — TOC Transition Note (Addendum)
Transition of Care [facility]) - CM/SW Discharge Note   Patient Details  Name: Vertis Scheib MRN: [id number] Date of Birth: [birth year]  [facility] CM/SW Contact:  Alberteen Sam, LCSW Phone Number: 11/08/2018, 4:28 PM   Clinical Narrative:     Patient will DC to: [facility] Anticipated DC date: 11/08/2018 Family notified:Jamaal Transport PJ:KDTO  Per MD patient ready for DC to [facility] . RN, patient, patient's family, and facility notified of DC. Discharge Summary sent to facility. RN given number for report  [phone]. DC packet on chart. Ambulance transport requested for patient.  CSW signing off.  CSW did confirmed with supervisor Denyse Amass that [facility] is equipped to take uncuffed trach, CSW informed [facility] of his uncuffed trach at time of discharge as well when scheduling transportation.   [place], [place]   Final next level of care: Skilled Nursing Facility Barriers to Discharge: No Barriers Identified   Patient Goals and CMS Choice   CMS Medicare.gov Compare Post Acute Care list provided to:: Patient Represenative (must comment)(son Jamaal) Choice offered to / list presented to : Adult Children  Discharge Placement PASRR number recieved: 11/18/18            Patient chooses bed at: Other - please specify in the comment section below:([facility]) Patient to be transferred to facility by: [place] Name of family member notified: Jamaal Patient and family notified of of transfer: 11/08/18  Discharge Plan and Services     Post Acute Care Choice: Long Term Acute Care (LTAC)                               Social Determinants of Health (SDOH) Interventions     Readmission Risk Interventions No flowsheet data found.

## 2018-11-08 NOTE — Progress Notes (Signed)
Pharmacy Antibiotic Note  Micheal Carey is a 68 y.o. male admitted on 09/08/2018 with pneumonia.  Pharmacy has been consulted for cefepime dosing.   He is noted with recent Acinetobacter, Citrobacter in trach cultures (sensitive to cefepime).   -WBC= 9.1, afebrile, CrCl ~ 80  Plan: -Cefepime 2gm IV q8h -Do not forsee any dose adjustments, pharmacy to sign off and follow peripherally   Height: 5\' 8"  (172.7 cm) Weight: 137 lb 9.1 oz (62.4 kg) IBW/kg (Calculated) : 68.4  Temp (24hrs), Avg:98.5 F (36.9 C), Min:98 F (36.7 C), Max:99.5 F (37.5 C)  Recent Labs  Lab 11/05/18 0121 11/05/18 0954  WBC 9.1  --   CREATININE  --  0.82    Estimated Creatinine Clearance: 78.2 mL/min (by C-G formula based on SCr of 0.82 mg/dL).    No Known Allergies  Antimicrobials this admission: Vanc 8/12 >>8/14 Zosyn 8/12 >>8/14 CTX 8/15 >>8/17 Cefepime 8/28>>8/31, 10/6>>10/11 Merrem 8/31 >> 9/7  10/6 cefepime>>  Dose adjustments this admission:  Microbiology results: 8/27: TA - Acinetobacter, Citrobacter (both suc to cefepime) 8/28: UCx - Enterobacter species, Citrobacter(both S Primaxin)  Thank you for allowing pharmacy to be a part of this patient's care.  Erin Hearing PharmD., BCPS Clinical Pharmacist 11/08/2018 9:20 AM

## 2018-11-09 MED ORDER — METOPROLOL TARTRATE 5 MG/5ML IV SOLN
5.0000 mg | Freq: Once | INTRAVENOUS | Status: AC
  Administered 2018-11-09: 5 mg via INTRAVENOUS
  Filled 2018-11-09: qty 5

## 2018-11-09 NOTE — Progress Notes (Signed)
CSW spoke with Clarene Critchley at [facility] who reports that last night's decline of patient transport had escalated to their regional business development leader Shaun as well as the Software engineer of their facilities. They report the DON that declined patient last night is being terminated and they apologized for the occurrence yesterday evening. [facility] reports they are more than qualified and capable of caring for patient's needs as previously determined upon acceptance and insurance authorization approval. They report to send patient this morning and express their apologies to the family members as well.   CSW has informed Dr. Maylene Roes of this information. Patient to discharge to [facility] today. Family members informed.   [place], [place]

## 2018-11-09 NOTE — Progress Notes (Signed)
Try to reach out the receiving nurse at [facility] for Mr Stein. They said the room is not ready and asked the call back number. I left my number to call back  Palma Holter, Therapist, sports

## 2018-11-09 NOTE — Progress Notes (Signed)
CSW has called [facility] they should be here by 4pm, nurse has completed report called to [facility]. Patient will be discharging with 4 22mm uncuffed Shiley and oxygen connector. No further needs identified, facility made aware.   [place], [place]

## 2018-11-09 NOTE — Progress Notes (Signed)
Pt left to [facility] at 6.30pm via EMS. Report was provided to the receiving RN earlier and to the [facility] as well. Pt left with all of his belongings, and extra inner cannulas per [facility] Nurse request. Pt's vitals stable prior to leaving. Tele monitor DC, IV line discontinued.  Thomos Domine,RN.

## 2018-11-09 NOTE — Plan of Care (Addendum)
  Problem: Clinical Measurements: Goal: Will remain free from infection 11/09/2018 1852 by Neil Crouch, RN Outcome: Completed/Met 11/09/2018 1047 by Neil Crouch, RN Outcome: Adequate for Discharge 11/09/2018 1047 by Neil Crouch, RN Outcome: Adequate for Discharge

## 2018-11-09 NOTE — Progress Notes (Signed)
  PROGRESS NOTE  Patient remains stable for discharge to SNF.  Discharge summary updated to reflect today's date.  Dessa Phi, DO [facility] 11/09/2018, 9:43 AM  Available via Epic secure chat 7am-7pm After these hours, please refer to coverage provider listed on [email or web address]

## 2018-11-09 NOTE — Evaluation (Signed)
[facility] Speaking [facility]ve - Evaluation Patient Details  Name: Micheal Carey MRN: [id number] Date of Birth: 1950/10/25  Today's Date: 11/09/2018 Time: 1119-1135 SLP Time Calculation (min) (ACUTE ONLY): 16 min  Past Medical History:  Past Medical History:  Diagnosis Date  . CAP (community acquired pneumonia) 10/14/2015  . Cardiac arrest ([place]) 09/08/2018  . ETOH abuse   . Opioid abuse ([place])   . Pneumonia    "twice before" (10/14/2015)   Past Surgical History:  Past Surgical History:  Procedure Laterality Date  . IR GASTROSTOMY TUBE MOD SED  09/18/2018  . JOINT REPLACEMENT    . TOTAL HIP ARTHROPLASTY Right    HPI:  Pt is a 68 y.o. man admitted 09/08/18 after found down with PEA arrest with ROSC after 10 minutes but pt remained unresponsive, +ETOH, opiates; pt intubated. Hospital course prolonged due to severe anoxic encephalopathy. S/p PEG and trach. Also concern for aspiration PNA. PMH includes COPD, ETOH abuse, opioid abuse.   Assessment / Plan / Recommendation Clinical Impression  Pt demosntrates successful redirection of air to upper airway with 6 cuffless trach and PMSV in place for 10 minute interval. No signs of intolerance; phonation audible through involuntary engagement - cough, sigh, throat clear. No volitional phonation despite max visual verbal, contextual cues and motor stim. Will continue efforts though pt likely to d/c soon. PMSV trials and cognitive therapy to continue at SNF level. For now PMSV with SLP only.  SLP Visit Diagnosis: Aphonia (R49.1)    SLP Assessment  Patient needs continued Speech Lanaguage Pathology Services    Follow Up Recommendations  Skilled Nursing facility    Frequency and Duration min 2x/week  2 weeks    PMSV Trial PMSV was placed for: 13 minutes Able to redirect subglottic air through upper airway: Yes Able to Attain Phonation: Yes Able to Expectorate Secretions: Yes Level of Secretion Expectoration with PMSV: Oral Respirations During  Trial: 15 SpO2 During Trial: 100 %   Tracheostomy Tube  Additional Tracheostomy Tube Assessment Trach Collar Period: all waking hours Secretion Description: none    Vent Dependency  Vent Dependent: No FiO2 (%): 21 %    Cuff Deflation Trial  GO          Chloee Tena, Katherene Ponto 11/09/2018, 11:45 AM

## 2018-11-09 NOTE — Plan of Care (Signed)
  Problem: Clinical Measurements: Goal: Will remain free from infection Outcome: Adequate for Discharge   

## 2018-11-09 NOTE — TOC Transition Note (Signed)
Transition of Care [facility]) - CM/SW Discharge Note   Patient Details  Name: Mosiah Bastin MRN: [id number] Date of Birth: 1950/02/02  Transition [facility] Care [facility]) CM/SW Contact:  Alberteen Sam, LCSW Phone Number: 11/09/2018, 10:03 AM   Clinical Narrative:      Patient ready for DC to [facility] . RN, patient, patient's family, and facility notified of DC. Discharge Summary sent to facility. RN given number for report  [phone]. DC packet on chart. Ambulance transport requested for patient.  CSW signing off.   [place], [place]  Final next level of care: Skilled Nursing Facility Barriers to Discharge: No Barriers Identified   Patient Goals and CMS Choice   CMS Medicare.gov Compare Post Acute Care list provided to:: Patient Represenative (must comment)(son Jamaal) Choice offered to / list presented to : Adult Children  Discharge Placement PASRR number recieved: 11/18/18            Patient chooses bed at: Other - please specify in the comment section below:([facility]) Patient to be transferred to facility by: [place] Name of family member notified: Jamaal Patient and family notified of of transfer: 11/08/18  Discharge Plan and Services     Post Acute Care Choice: Long Term Acute Care (LTAC)                               Social Determinants of Health (SDOH) Interventions     Readmission Risk Interventions No flowsheet data found.

## 2019-03-03 DEATH — deceased

## 2020-04-16 IMAGING — DX PORTABLE ABDOMEN - 1 VIEW
1 series · 2 of 2 positions shown · non-contrast
Comparison: CT of the abdomen and pelvis on 09/16/2018

CLINICAL DATA: Nausea and vomiting.

EXAM:
PORTABLE ABDOMEN - 1 VIEW

[Series 1: abdomen · 0.14mm/px · 2 of 2 slices shown]
[im 1/2]
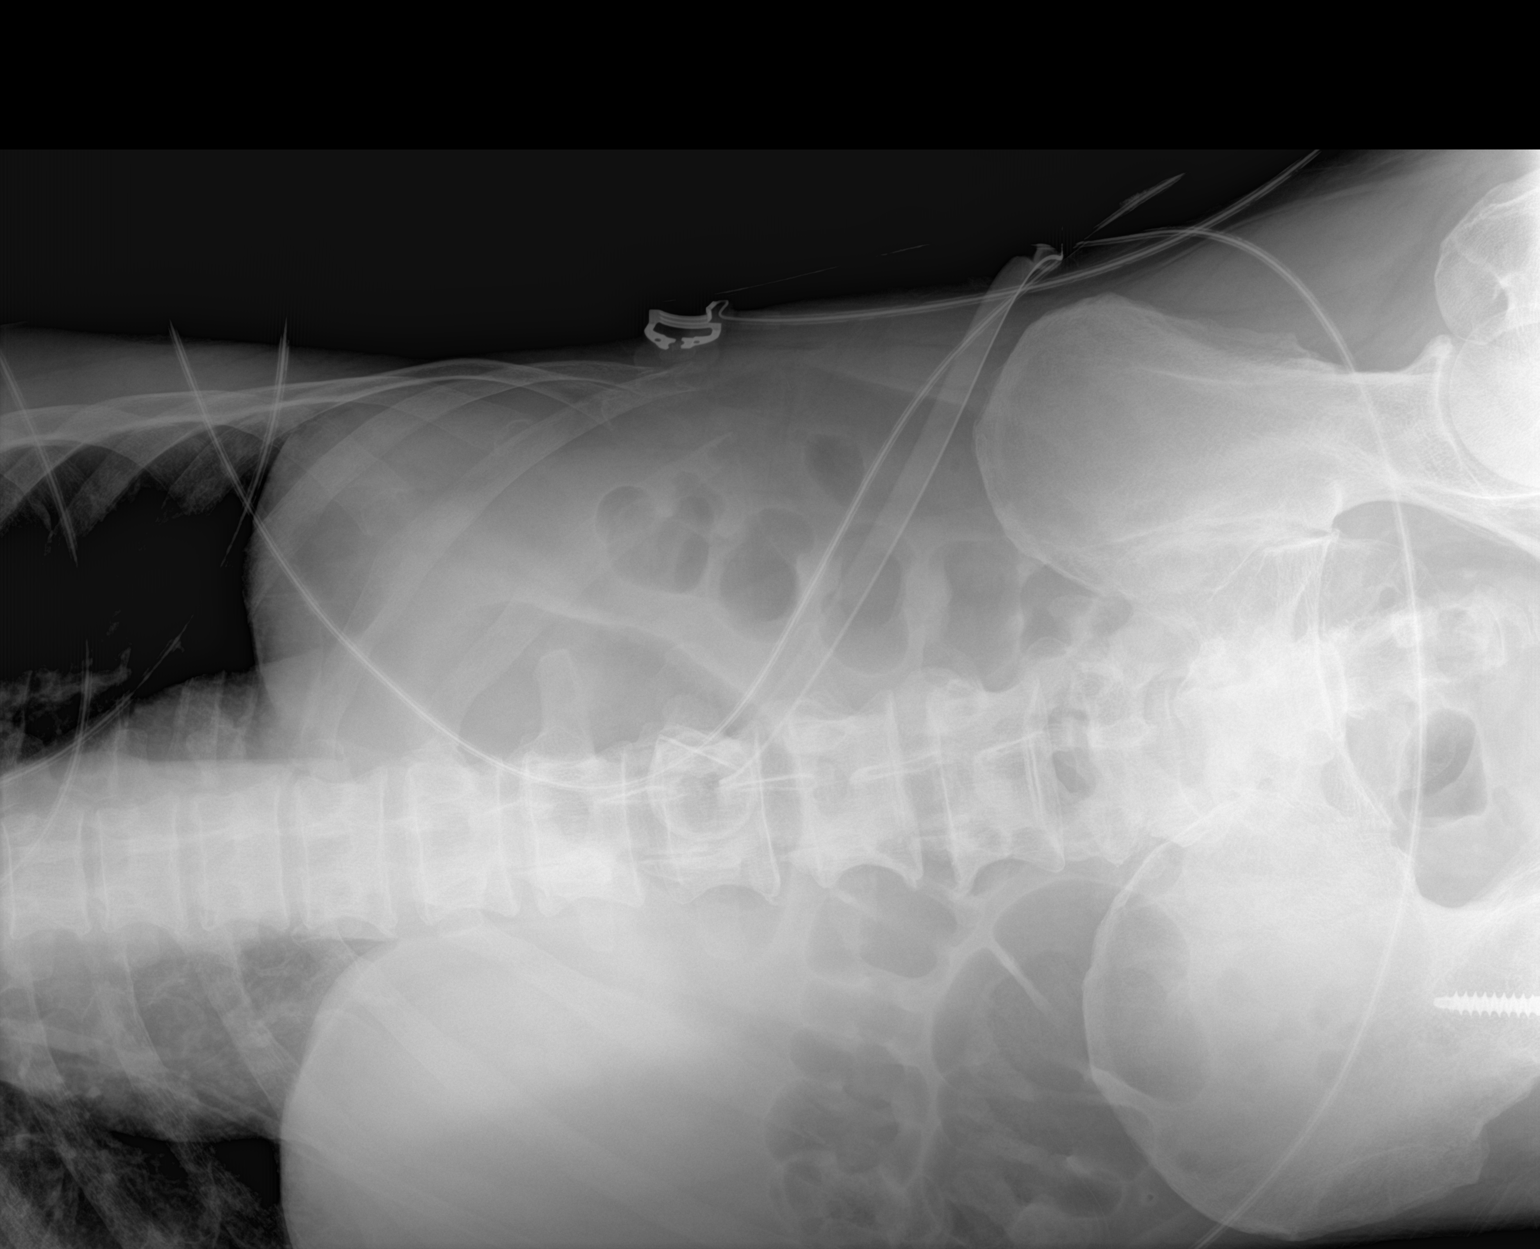
[im 2/2]
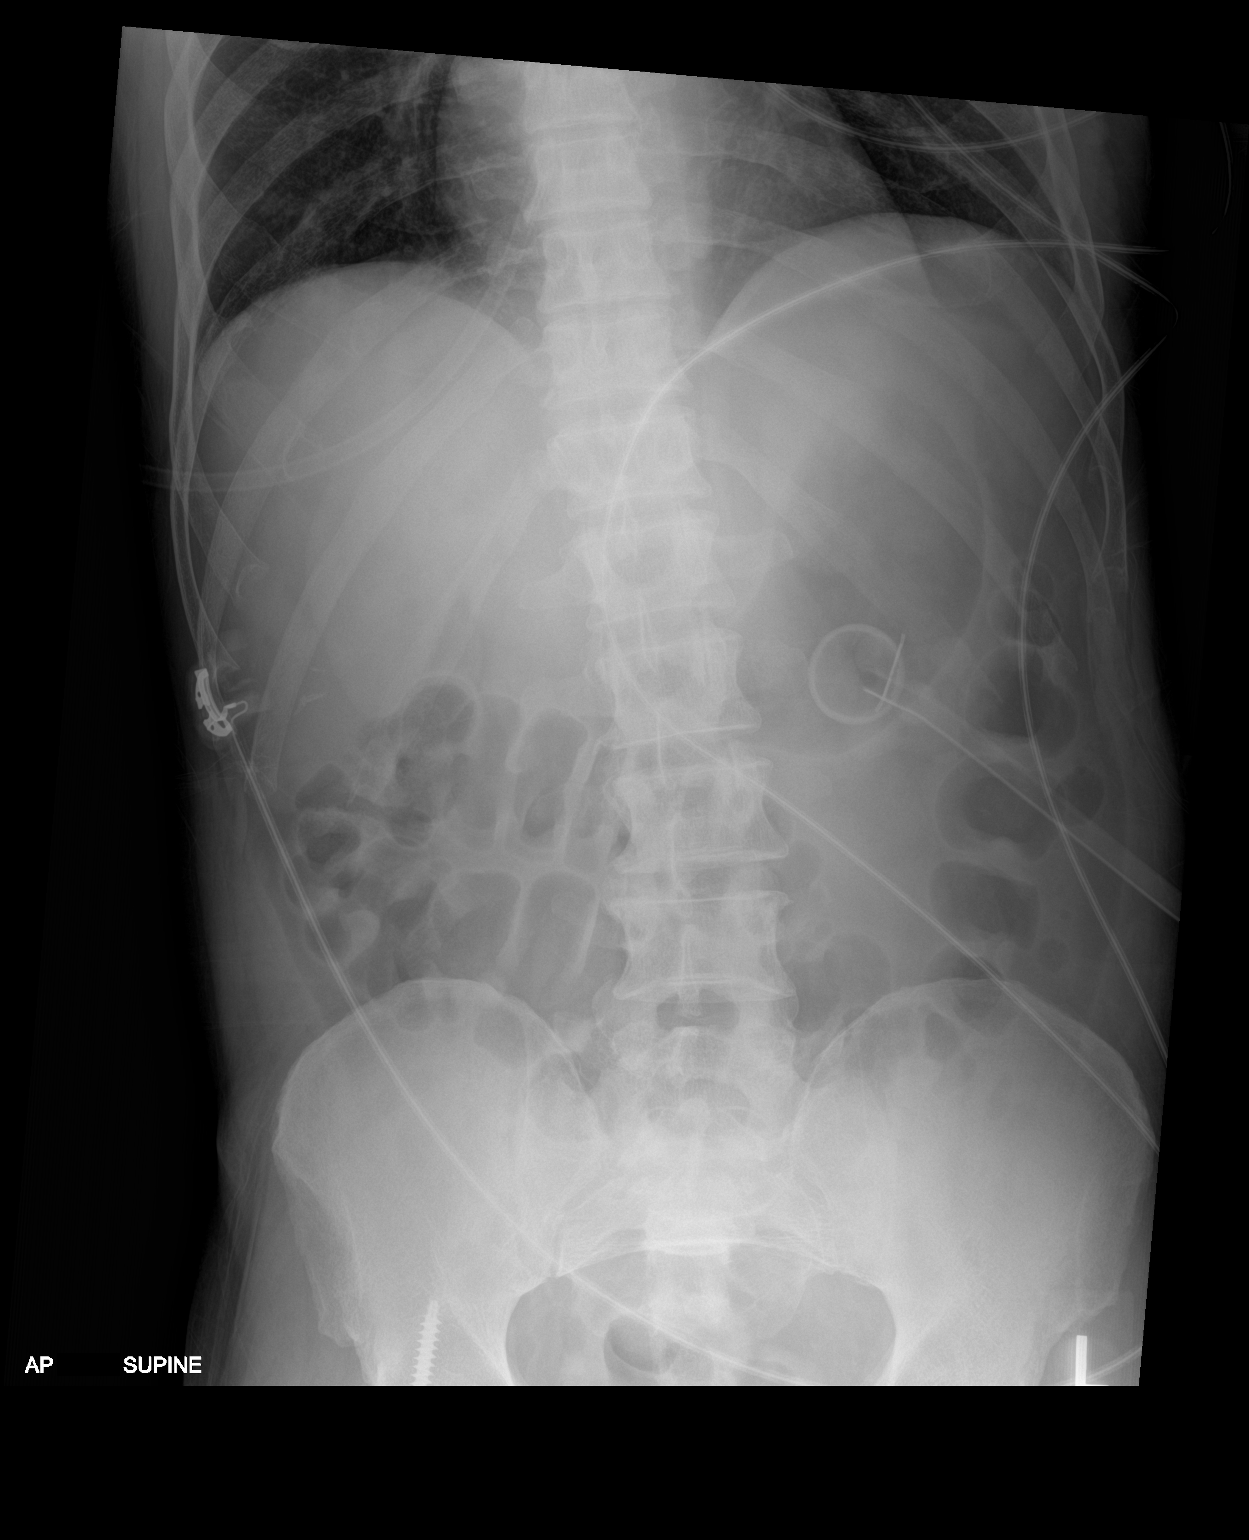

[2 of 2 positions shown; findings below may reference images not displayed]

FINDINGS: Patient has a gastrostomy tube. Bowel gas pattern is nonobstructive.
No evidence for free intraperitoneal air. Remote RIGHT hip
arthroplasty.
IMPRESSION: Negative.

## 2020-04-20 IMAGING — DX PORTABLE CHEST - 1 VIEW
1 series · 1 of 1 positions shown · non-contrast
Comparison: Chest x-ray dated September 19, 2018.

CLINICAL DATA: Fever.

EXAM:
PORTABLE CHEST 1 VIEW

[chest ap]
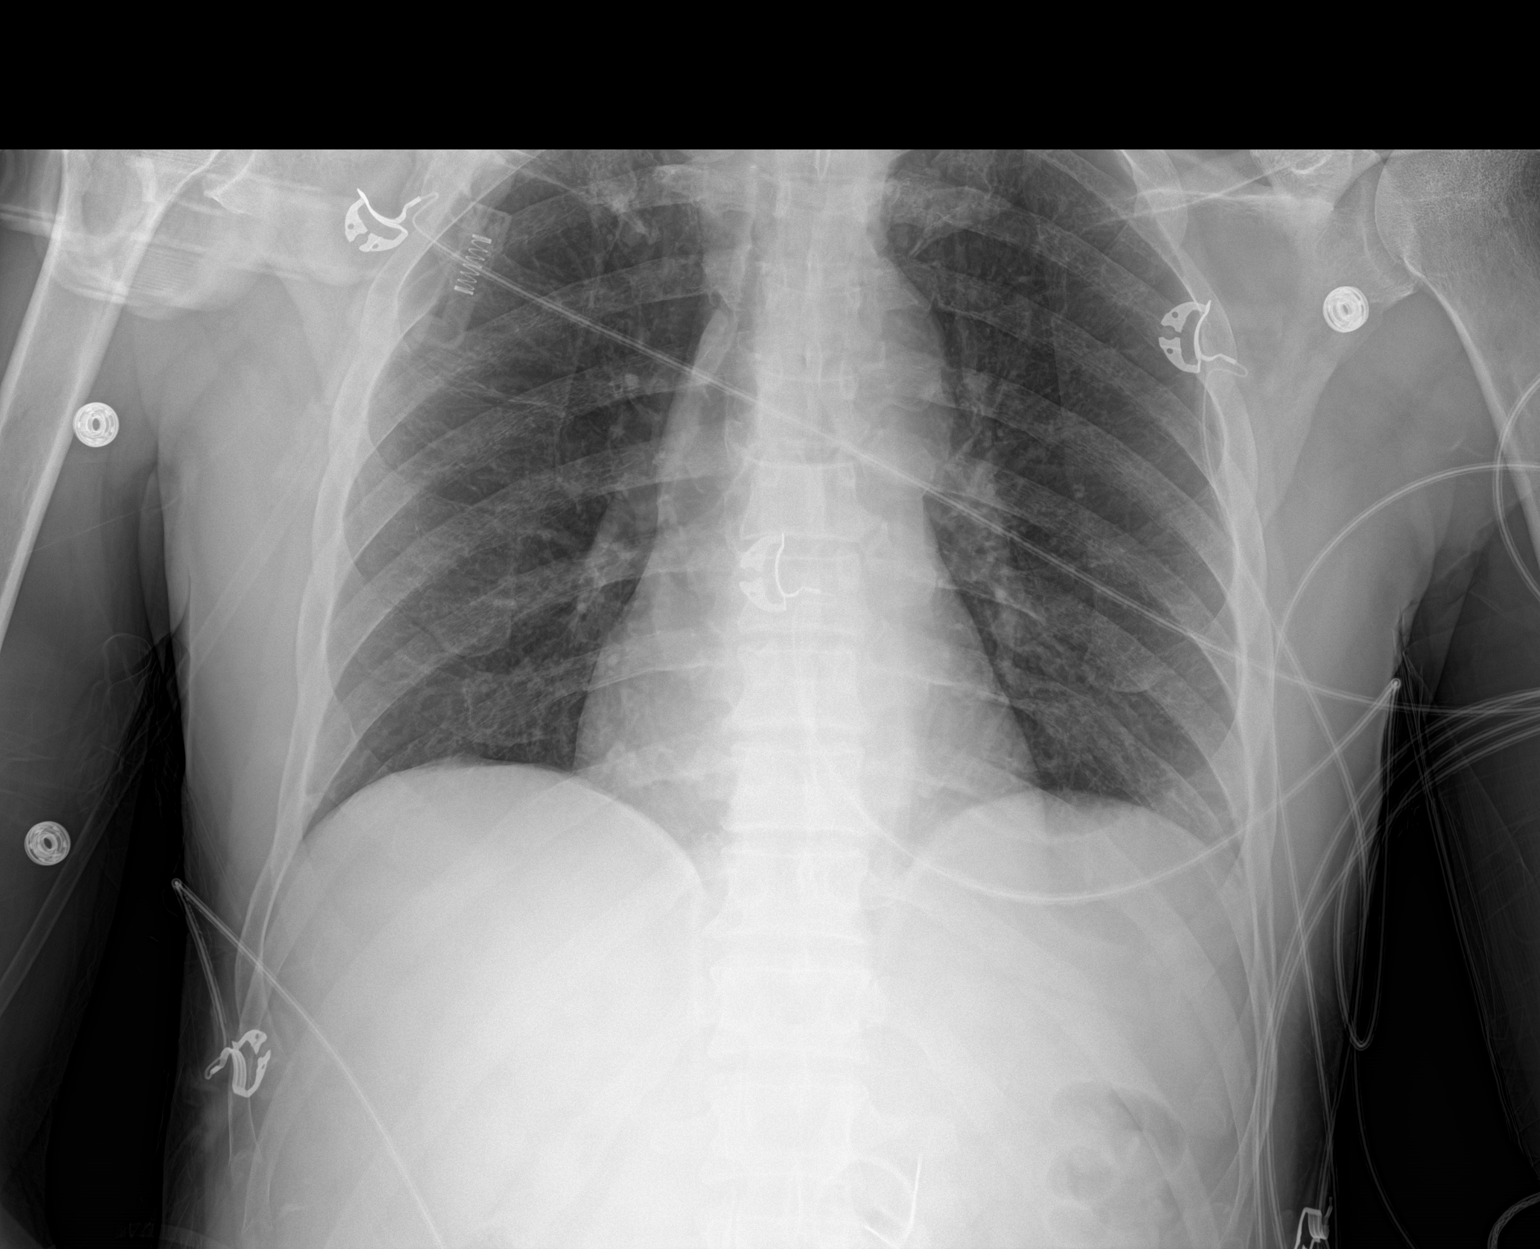

[1 of 1 positions shown; findings below may reference images not displayed]

FINDINGS: Unchanged tracheostomy tube with tip at the thoracic inlet. The
heart size and mediastinal contours are within normal limits. Normal
pulmonary vascularity. No focal consolidation, pleural effusion, or
pneumothorax. No acute osseous abnormality.
IMPRESSION: No active disease.

## 2020-04-25 IMAGING — DX PORTABLE CHEST - 1 VIEW
1 series · 1 of 1 positions shown · non-contrast
Comparison: 09/25/2018

CLINICAL DATA: Acute respiratory failure.

EXAM:
PORTABLE CHEST 1 VIEW

[chest ap]
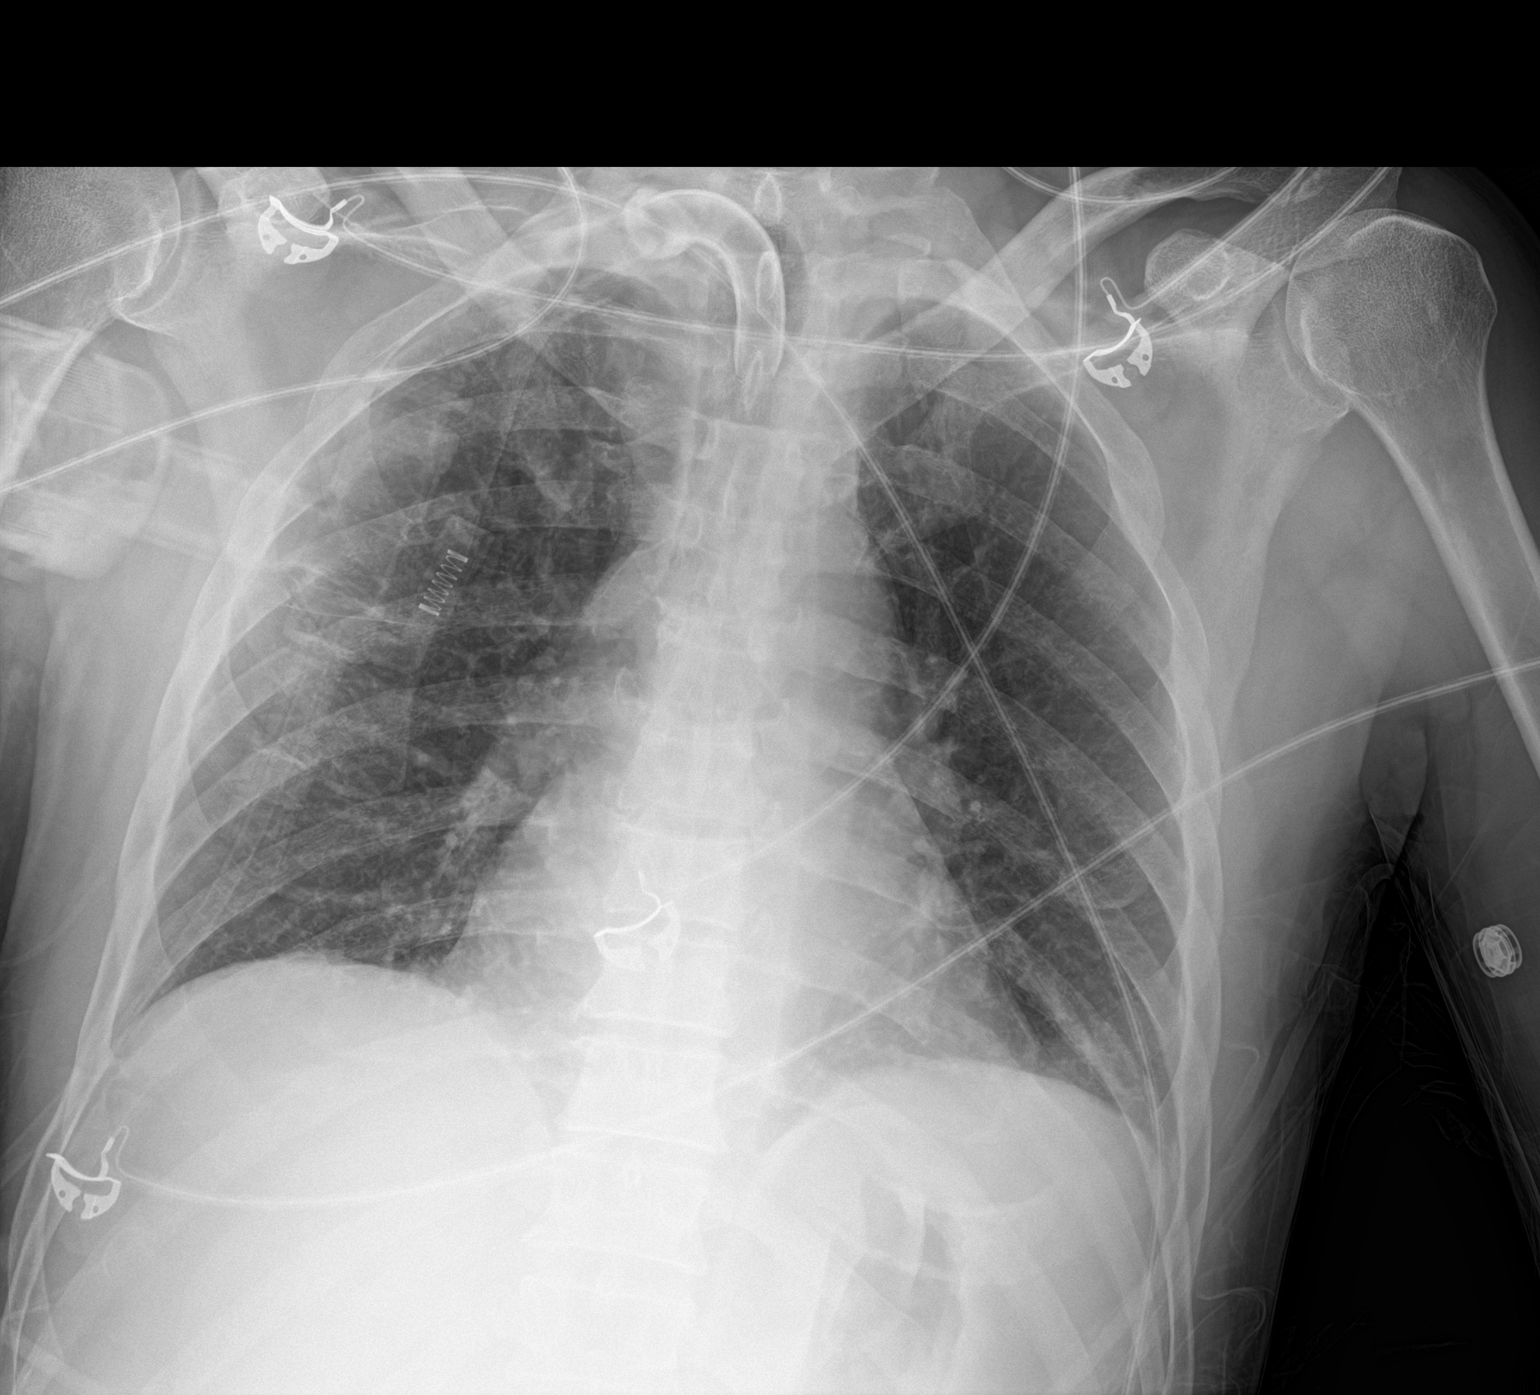

[1 of 1 positions shown; findings below may reference images not displayed]

FINDINGS: Tracheostomy tube remains in place. Heart size is normal. Both lungs
are clear.
IMPRESSION: No active disease.

## 2021-08-15 ENCOUNTER — Other Ambulatory Visit (HOSPITAL_COMMUNITY): Payer: Self-pay
